# Patient Record
Sex: Female | Born: 1935
Health system: Southern US, Community
[De-identification: ages and names within clinical notes are randomized; demographics above are authoritative.]

## PROBLEM LIST (undated history)

## (undated) DIAGNOSIS — M810 Age-related osteoporosis without current pathological fracture: Secondary | ICD-10-CM

## (undated) DIAGNOSIS — B019 Varicella without complication: Secondary | ICD-10-CM

## (undated) DIAGNOSIS — N390 Urinary tract infection, site not specified: Secondary | ICD-10-CM

## (undated) DIAGNOSIS — M1990 Unspecified osteoarthritis, unspecified site: Secondary | ICD-10-CM

## (undated) DIAGNOSIS — F419 Anxiety disorder, unspecified: Secondary | ICD-10-CM

## (undated) DIAGNOSIS — E119 Type 2 diabetes mellitus without complications: Secondary | ICD-10-CM

## (undated) DIAGNOSIS — K219 Gastro-esophageal reflux disease without esophagitis: Secondary | ICD-10-CM

## (undated) DIAGNOSIS — I10 Essential (primary) hypertension: Secondary | ICD-10-CM

## (undated) HISTORY — PX: GALLBLADDER SURGERY: SHX652

## (undated) HISTORY — DX: Essential (primary) hypertension: I10

## (undated) HISTORY — PX: CHOLECYSTECTOMY: SHX55

## (undated) HISTORY — DX: Gastro-esophageal reflux disease without esophagitis: K21.9

## (undated) HISTORY — DX: Urinary tract infection, site not specified: N39.0

## (undated) HISTORY — PX: WRIST FRACTURE SURGERY: SHX121

## (undated) HISTORY — PX: TONSILLECTOMY: SUR1361

## (undated) HISTORY — DX: Varicella without complication: B01.9

## (undated) HISTORY — DX: Type 2 diabetes mellitus without complications: E11.9

## (undated) HISTORY — DX: Age-related osteoporosis without current pathological fracture: M81.0

## (undated) HISTORY — PX: ABDOMINAL HYSTERECTOMY: SHX81

## (undated) HISTORY — DX: Unspecified osteoarthritis, unspecified site: M19.90

---
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
## 1982-08-09 HISTORY — PX: ABDOMINAL HYSTERECTOMY: SHX81

## 2013-04-06 LAB — HM PAP SMEAR: HM Pap smear: NORMAL

## 2015-06-17 DIAGNOSIS — F32A Depression, unspecified: Secondary | ICD-10-CM | POA: Insufficient documentation

## 2015-06-17 DIAGNOSIS — J40 Bronchitis, not specified as acute or chronic: Secondary | ICD-10-CM | POA: Insufficient documentation

## 2015-06-17 HISTORY — DX: Bronchitis, not specified as acute or chronic: J40

## 2015-08-25 DIAGNOSIS — R6884 Jaw pain: Secondary | ICD-10-CM | POA: Diagnosis not present

## 2015-08-28 DIAGNOSIS — R300 Dysuria: Secondary | ICD-10-CM | POA: Diagnosis not present

## 2015-08-28 DIAGNOSIS — N3090 Cystitis, unspecified without hematuria: Secondary | ICD-10-CM | POA: Diagnosis not present

## 2015-09-09 DIAGNOSIS — I10 Essential (primary) hypertension: Secondary | ICD-10-CM | POA: Diagnosis not present

## 2015-09-09 DIAGNOSIS — E119 Type 2 diabetes mellitus without complications: Secondary | ICD-10-CM | POA: Diagnosis not present

## 2015-09-12 DIAGNOSIS — N390 Urinary tract infection, site not specified: Secondary | ICD-10-CM | POA: Diagnosis not present

## 2015-09-25 DIAGNOSIS — E119 Type 2 diabetes mellitus without complications: Secondary | ICD-10-CM | POA: Diagnosis not present

## 2015-09-25 DIAGNOSIS — R300 Dysuria: Secondary | ICD-10-CM | POA: Diagnosis not present

## 2015-09-25 DIAGNOSIS — E785 Hyperlipidemia, unspecified: Secondary | ICD-10-CM | POA: Diagnosis not present

## 2015-11-24 DIAGNOSIS — I10 Essential (primary) hypertension: Secondary | ICD-10-CM | POA: Diagnosis not present

## 2015-11-24 DIAGNOSIS — E119 Type 2 diabetes mellitus without complications: Secondary | ICD-10-CM | POA: Diagnosis not present

## 2015-11-24 DIAGNOSIS — F419 Anxiety disorder, unspecified: Secondary | ICD-10-CM | POA: Diagnosis not present

## 2015-11-28 DIAGNOSIS — L989 Disorder of the skin and subcutaneous tissue, unspecified: Secondary | ICD-10-CM | POA: Diagnosis not present

## 2015-12-25 DIAGNOSIS — E119 Type 2 diabetes mellitus without complications: Secondary | ICD-10-CM | POA: Diagnosis not present

## 2015-12-25 DIAGNOSIS — R300 Dysuria: Secondary | ICD-10-CM | POA: Diagnosis not present

## 2016-01-14 DIAGNOSIS — N390 Urinary tract infection, site not specified: Secondary | ICD-10-CM | POA: Diagnosis not present

## 2016-01-27 DIAGNOSIS — B078 Other viral warts: Secondary | ICD-10-CM | POA: Diagnosis not present

## 2016-01-27 DIAGNOSIS — E1141 Type 2 diabetes mellitus with diabetic mononeuropathy: Secondary | ICD-10-CM | POA: Diagnosis not present

## 2016-01-27 DIAGNOSIS — I739 Peripheral vascular disease, unspecified: Secondary | ICD-10-CM | POA: Diagnosis not present

## 2016-01-27 DIAGNOSIS — R601 Generalized edema: Secondary | ICD-10-CM | POA: Diagnosis not present

## 2016-01-27 DIAGNOSIS — B351 Tinea unguium: Secondary | ICD-10-CM | POA: Diagnosis not present

## 2016-02-03 DIAGNOSIS — Z1272 Encounter for screening for malignant neoplasm of vagina: Secondary | ICD-10-CM | POA: Diagnosis not present

## 2016-02-03 DIAGNOSIS — Z01419 Encounter for gynecological examination (general) (routine) without abnormal findings: Secondary | ICD-10-CM | POA: Diagnosis not present

## 2016-02-05 DIAGNOSIS — I739 Peripheral vascular disease, unspecified: Secondary | ICD-10-CM | POA: Diagnosis not present

## 2016-02-05 DIAGNOSIS — R601 Generalized edema: Secondary | ICD-10-CM | POA: Diagnosis not present

## 2016-02-05 DIAGNOSIS — E1141 Type 2 diabetes mellitus with diabetic mononeuropathy: Secondary | ICD-10-CM | POA: Diagnosis not present

## 2016-02-05 DIAGNOSIS — M1990 Unspecified osteoarthritis, unspecified site: Secondary | ICD-10-CM | POA: Diagnosis not present

## 2016-02-06 DIAGNOSIS — E1141 Type 2 diabetes mellitus with diabetic mononeuropathy: Secondary | ICD-10-CM | POA: Diagnosis not present

## 2016-02-06 DIAGNOSIS — I739 Peripheral vascular disease, unspecified: Secondary | ICD-10-CM | POA: Diagnosis not present

## 2016-02-06 DIAGNOSIS — R601 Generalized edema: Secondary | ICD-10-CM | POA: Diagnosis not present

## 2016-02-06 DIAGNOSIS — M1990 Unspecified osteoarthritis, unspecified site: Secondary | ICD-10-CM | POA: Diagnosis not present

## 2016-02-17 DIAGNOSIS — Z1231 Encounter for screening mammogram for malignant neoplasm of breast: Secondary | ICD-10-CM | POA: Diagnosis not present

## 2016-02-17 LAB — HM MAMMOGRAPHY

## 2016-02-18 DIAGNOSIS — E119 Type 2 diabetes mellitus without complications: Secondary | ICD-10-CM | POA: Diagnosis not present

## 2016-02-18 DIAGNOSIS — R2681 Unsteadiness on feet: Secondary | ICD-10-CM | POA: Diagnosis not present

## 2016-02-18 DIAGNOSIS — F419 Anxiety disorder, unspecified: Secondary | ICD-10-CM | POA: Diagnosis not present

## 2016-02-18 DIAGNOSIS — E785 Hyperlipidemia, unspecified: Secondary | ICD-10-CM | POA: Diagnosis not present

## 2016-02-18 DIAGNOSIS — I10 Essential (primary) hypertension: Secondary | ICD-10-CM | POA: Diagnosis not present

## 2016-02-24 DIAGNOSIS — R601 Generalized edema: Secondary | ICD-10-CM | POA: Diagnosis not present

## 2016-02-24 DIAGNOSIS — E1141 Type 2 diabetes mellitus with diabetic mononeuropathy: Secondary | ICD-10-CM | POA: Diagnosis not present

## 2016-02-24 DIAGNOSIS — M1990 Unspecified osteoarthritis, unspecified site: Secondary | ICD-10-CM | POA: Diagnosis not present

## 2016-02-24 DIAGNOSIS — I739 Peripheral vascular disease, unspecified: Secondary | ICD-10-CM | POA: Diagnosis not present

## 2016-03-03 DIAGNOSIS — R2681 Unsteadiness on feet: Secondary | ICD-10-CM | POA: Diagnosis not present

## 2016-03-05 DIAGNOSIS — R2689 Other abnormalities of gait and mobility: Secondary | ICD-10-CM | POA: Diagnosis not present

## 2016-03-10 DIAGNOSIS — R2689 Other abnormalities of gait and mobility: Secondary | ICD-10-CM | POA: Diagnosis not present

## 2016-03-12 DIAGNOSIS — R2689 Other abnormalities of gait and mobility: Secondary | ICD-10-CM | POA: Diagnosis not present

## 2016-03-15 DIAGNOSIS — R2689 Other abnormalities of gait and mobility: Secondary | ICD-10-CM | POA: Diagnosis not present

## 2016-03-17 DIAGNOSIS — R2689 Other abnormalities of gait and mobility: Secondary | ICD-10-CM | POA: Diagnosis not present

## 2016-03-22 DIAGNOSIS — R2689 Other abnormalities of gait and mobility: Secondary | ICD-10-CM | POA: Diagnosis not present

## 2016-03-24 DIAGNOSIS — R2689 Other abnormalities of gait and mobility: Secondary | ICD-10-CM | POA: Diagnosis not present

## 2016-03-29 DIAGNOSIS — S81811A Laceration without foreign body, right lower leg, initial encounter: Secondary | ICD-10-CM | POA: Diagnosis not present

## 2016-03-29 DIAGNOSIS — E119 Type 2 diabetes mellitus without complications: Secondary | ICD-10-CM | POA: Diagnosis not present

## 2016-03-30 DIAGNOSIS — B351 Tinea unguium: Secondary | ICD-10-CM | POA: Diagnosis not present

## 2016-03-30 DIAGNOSIS — B078 Other viral warts: Secondary | ICD-10-CM | POA: Diagnosis not present

## 2016-03-30 DIAGNOSIS — R601 Generalized edema: Secondary | ICD-10-CM | POA: Diagnosis not present

## 2016-03-30 DIAGNOSIS — M1990 Unspecified osteoarthritis, unspecified site: Secondary | ICD-10-CM | POA: Diagnosis not present

## 2016-03-30 DIAGNOSIS — E1141 Type 2 diabetes mellitus with diabetic mononeuropathy: Secondary | ICD-10-CM | POA: Diagnosis not present

## 2016-03-30 DIAGNOSIS — I739 Peripheral vascular disease, unspecified: Secondary | ICD-10-CM | POA: Diagnosis not present

## 2016-04-30 DIAGNOSIS — I10 Essential (primary) hypertension: Secondary | ICD-10-CM | POA: Diagnosis not present

## 2016-04-30 DIAGNOSIS — R21 Rash and other nonspecific skin eruption: Secondary | ICD-10-CM | POA: Diagnosis not present

## 2016-05-15 DIAGNOSIS — N760 Acute vaginitis: Secondary | ICD-10-CM | POA: Diagnosis not present

## 2016-05-15 DIAGNOSIS — E785 Hyperlipidemia, unspecified: Secondary | ICD-10-CM | POA: Diagnosis not present

## 2016-05-15 DIAGNOSIS — E119 Type 2 diabetes mellitus without complications: Secondary | ICD-10-CM | POA: Diagnosis not present

## 2016-05-15 DIAGNOSIS — N341 Nonspecific urethritis: Secondary | ICD-10-CM | POA: Diagnosis not present

## 2016-05-20 DIAGNOSIS — N390 Urinary tract infection, site not specified: Secondary | ICD-10-CM | POA: Diagnosis not present

## 2016-05-20 DIAGNOSIS — E119 Type 2 diabetes mellitus without complications: Secondary | ICD-10-CM | POA: Diagnosis not present

## 2016-05-20 DIAGNOSIS — J309 Allergic rhinitis, unspecified: Secondary | ICD-10-CM | POA: Diagnosis not present

## 2016-05-20 DIAGNOSIS — F419 Anxiety disorder, unspecified: Secondary | ICD-10-CM | POA: Diagnosis not present

## 2016-05-20 DIAGNOSIS — I10 Essential (primary) hypertension: Secondary | ICD-10-CM | POA: Diagnosis not present

## 2016-06-16 DIAGNOSIS — R601 Generalized edema: Secondary | ICD-10-CM | POA: Diagnosis not present

## 2016-06-16 DIAGNOSIS — E1141 Type 2 diabetes mellitus with diabetic mononeuropathy: Secondary | ICD-10-CM | POA: Diagnosis not present

## 2016-06-16 DIAGNOSIS — L609 Nail disorder, unspecified: Secondary | ICD-10-CM | POA: Diagnosis not present

## 2016-06-16 DIAGNOSIS — B078 Other viral warts: Secondary | ICD-10-CM | POA: Diagnosis not present

## 2016-06-16 DIAGNOSIS — M1990 Unspecified osteoarthritis, unspecified site: Secondary | ICD-10-CM | POA: Diagnosis not present

## 2016-06-16 DIAGNOSIS — I739 Peripheral vascular disease, unspecified: Secondary | ICD-10-CM | POA: Diagnosis not present

## 2016-06-16 DIAGNOSIS — L84 Corns and callosities: Secondary | ICD-10-CM | POA: Diagnosis not present

## 2016-06-30 DIAGNOSIS — L299 Pruritus, unspecified: Secondary | ICD-10-CM | POA: Diagnosis not present

## 2016-07-13 HISTORY — PX: MANDIBLE FRACTURE SURGERY: SHX706

## 2016-07-29 DIAGNOSIS — H04123 Dry eye syndrome of bilateral lacrimal glands: Secondary | ICD-10-CM | POA: Diagnosis not present

## 2016-07-29 DIAGNOSIS — H2513 Age-related nuclear cataract, bilateral: Secondary | ICD-10-CM | POA: Diagnosis not present

## 2016-07-29 DIAGNOSIS — E119 Type 2 diabetes mellitus without complications: Secondary | ICD-10-CM | POA: Diagnosis not present

## 2016-08-23 DIAGNOSIS — Z0000 Encounter for general adult medical examination without abnormal findings: Secondary | ICD-10-CM | POA: Diagnosis not present

## 2016-08-23 DIAGNOSIS — E538 Deficiency of other specified B group vitamins: Secondary | ICD-10-CM | POA: Diagnosis not present

## 2016-08-23 DIAGNOSIS — E119 Type 2 diabetes mellitus without complications: Secondary | ICD-10-CM | POA: Diagnosis not present

## 2016-08-23 DIAGNOSIS — K219 Gastro-esophageal reflux disease without esophagitis: Secondary | ICD-10-CM | POA: Diagnosis not present

## 2016-08-23 DIAGNOSIS — M109 Gout, unspecified: Secondary | ICD-10-CM | POA: Diagnosis not present

## 2016-08-23 DIAGNOSIS — F419 Anxiety disorder, unspecified: Secondary | ICD-10-CM | POA: Diagnosis not present

## 2016-08-24 DIAGNOSIS — E119 Type 2 diabetes mellitus without complications: Secondary | ICD-10-CM | POA: Diagnosis not present

## 2016-08-24 DIAGNOSIS — I10 Essential (primary) hypertension: Secondary | ICD-10-CM | POA: Diagnosis not present

## 2016-08-24 DIAGNOSIS — E538 Deficiency of other specified B group vitamins: Secondary | ICD-10-CM | POA: Diagnosis not present

## 2016-08-24 DIAGNOSIS — M109 Gout, unspecified: Secondary | ICD-10-CM | POA: Diagnosis not present

## 2016-10-05 DIAGNOSIS — L820 Inflamed seborrheic keratosis: Secondary | ICD-10-CM | POA: Diagnosis not present

## 2016-10-05 DIAGNOSIS — L578 Other skin changes due to chronic exposure to nonionizing radiation: Secondary | ICD-10-CM | POA: Diagnosis not present

## 2016-10-05 DIAGNOSIS — L570 Actinic keratosis: Secondary | ICD-10-CM | POA: Diagnosis not present

## 2016-12-07 DIAGNOSIS — E119 Type 2 diabetes mellitus without complications: Secondary | ICD-10-CM | POA: Diagnosis not present

## 2016-12-07 DIAGNOSIS — N390 Urinary tract infection, site not specified: Secondary | ICD-10-CM | POA: Diagnosis not present

## 2016-12-07 DIAGNOSIS — I10 Essential (primary) hypertension: Secondary | ICD-10-CM | POA: Diagnosis not present

## 2016-12-08 DIAGNOSIS — B078 Other viral warts: Secondary | ICD-10-CM | POA: Diagnosis not present

## 2016-12-08 DIAGNOSIS — M1990 Unspecified osteoarthritis, unspecified site: Secondary | ICD-10-CM | POA: Diagnosis not present

## 2016-12-08 DIAGNOSIS — E1141 Type 2 diabetes mellitus with diabetic mononeuropathy: Secondary | ICD-10-CM | POA: Diagnosis not present

## 2016-12-08 DIAGNOSIS — R601 Generalized edema: Secondary | ICD-10-CM | POA: Diagnosis not present

## 2016-12-08 DIAGNOSIS — L84 Corns and callosities: Secondary | ICD-10-CM | POA: Diagnosis not present

## 2016-12-08 DIAGNOSIS — I739 Peripheral vascular disease, unspecified: Secondary | ICD-10-CM | POA: Diagnosis not present

## 2016-12-08 DIAGNOSIS — L609 Nail disorder, unspecified: Secondary | ICD-10-CM | POA: Diagnosis not present

## 2016-12-13 DIAGNOSIS — K219 Gastro-esophageal reflux disease without esophagitis: Secondary | ICD-10-CM | POA: Diagnosis not present

## 2016-12-13 DIAGNOSIS — E119 Type 2 diabetes mellitus without complications: Secondary | ICD-10-CM | POA: Diagnosis not present

## 2016-12-13 DIAGNOSIS — I10 Essential (primary) hypertension: Secondary | ICD-10-CM | POA: Diagnosis not present

## 2016-12-28 DIAGNOSIS — L820 Inflamed seborrheic keratosis: Secondary | ICD-10-CM | POA: Diagnosis not present

## 2017-03-29 DIAGNOSIS — L089 Local infection of the skin and subcutaneous tissue, unspecified: Secondary | ICD-10-CM | POA: Diagnosis not present

## 2017-03-29 DIAGNOSIS — L03113 Cellulitis of right upper limb: Secondary | ICD-10-CM | POA: Diagnosis not present

## 2017-03-29 DIAGNOSIS — S40811A Abrasion of right upper arm, initial encounter: Secondary | ICD-10-CM | POA: Diagnosis not present

## 2017-04-20 DIAGNOSIS — R21 Rash and other nonspecific skin eruption: Secondary | ICD-10-CM | POA: Diagnosis not present

## 2017-04-27 ENCOUNTER — Encounter: Payer: Self-pay | Admitting: Primary Care

## 2017-04-27 ENCOUNTER — Other Ambulatory Visit: Payer: Self-pay | Admitting: Primary Care

## 2017-04-27 ENCOUNTER — Ambulatory Visit (INDEPENDENT_AMBULATORY_CARE_PROVIDER_SITE_OTHER): Payer: Medicare Other | Admitting: Primary Care

## 2017-04-27 VITALS — BP 140/70 | HR 75 | Temp 98.5°F | Ht 65.75 in | Wt 130.8 lb

## 2017-04-27 DIAGNOSIS — N644 Mastodynia: Secondary | ICD-10-CM | POA: Diagnosis not present

## 2017-04-27 DIAGNOSIS — I10 Essential (primary) hypertension: Secondary | ICD-10-CM | POA: Insufficient documentation

## 2017-04-27 DIAGNOSIS — M810 Age-related osteoporosis without current pathological fracture: Secondary | ICD-10-CM | POA: Diagnosis not present

## 2017-04-27 DIAGNOSIS — E1169 Type 2 diabetes mellitus with other specified complication: Secondary | ICD-10-CM | POA: Insufficient documentation

## 2017-04-27 DIAGNOSIS — Z0100 Encounter for examination of eyes and vision without abnormal findings: Secondary | ICD-10-CM

## 2017-04-27 DIAGNOSIS — E119 Type 2 diabetes mellitus without complications: Secondary | ICD-10-CM

## 2017-04-27 LAB — VITAMIN D 25 HYDROXY (VIT D DEFICIENCY, FRACTURES): VITD: 56.57 ng/mL (ref 30.00–100.00)

## 2017-04-27 MED ORDER — VITAMIN D (ERGOCALCIFEROL) 1.25 MG (50000 UNIT) PO CAPS: 50000.0000 [IU] | ORAL_CAPSULE | ORAL | 1 refills | Status: DC

## 2017-04-27 MED ORDER — LOSARTAN POTASSIUM 25 MG PO TABS: 25.0000 mg | ORAL_TABLET | Freq: Every day | ORAL | 0 refills | Status: DC

## 2017-04-27 NOTE — Progress Notes (Signed)
Subjective:    Patient ID: Morgan Bowen, female    DOB: [birth year], 81 y.o.   MRN: [id number]  HPI  Morgan Bowen is a 81 year old female who presents today to establish care and discuss the problems mentioned below. Will obtain old records.Recently moved from [place] to be near her son since the recent passing of her husband.  1) Type 2 Diabetes: Diagnosed with diabetes in 1996. Currently managed on glimepiride 2 mg and metformin 500 mg BID. Her last A1C was three months ago and was 6.1. Her recent A1C was 6.5 in August 2018. She checks her blood sugars every morning fasting and gets readings of 120-137, sometimes high 200's.   2) Essential Hypertension: Currently managed on valsartan 320 mg, this was recently recalled and she is needing an alternative; and diltiazem 300 mg. Her BP in the office today is 140/70. She stopped taking valsartan 4 weeks ago. She checks her BP at home and gets readings of 130-140's/60's-70's. She denies dizziness, chest pain, shortness of breath.  3) Osteoporosis/Vitamin D deficiency: Currently managed on vitamin D 50,000 units once weekly. Her last vitamin D check was about 3 months ago, is needing a refill today. Her last bone density scan was one year ago.  4) Breast Pain: Located to the left breast which has been present for the past several months. She will experience a "twinge" type pain that will last for a few seconds then dissipate. She is due for her mammogram. She denies changes in skin texture and any masses/lumps.  Review of Systems  Constitutional: Negative for unexpected weight change.  Respiratory: Negative for shortness of breath.   Cardiovascular: Negative for chest pain.  Genitourinary:       Breast pain  Neurological: Negative for weakness, numbness and headaches.       Past Medical History:  Diagnosis Date  . Arthritis   . Chickenpox   . GERD (gastroesophageal reflux disease)   . Hypertension   . Osteoporosis   . Type 2 diabetes  mellitus ([place])      Social History   Social History  . Marital status: Widowed    Spouse name: N/A  . Number of children: N/A  . Years of education: N/A   Occupational History  . Not on file.   Social History Main Topics  . Smoking status: Never Smoker  . Smokeless tobacco: Never Used  . Alcohol use No  . Drug use: Unknown  . Sexual activity: Not on file   Other Topics Concern  . Not on file   Social History Narrative   Widowed.   Moved from [place].   1 son, 3 grandchildren.   Retired. Once worked as a Agricultural engineer.       Past Surgical History:  Procedure Laterality Date  . ABDOMINAL HYSTERECTOMY  1984  . GALLBLADDER SURGERY    . MANDIBLE FRACTURE SURGERY  07/13/2016  . TONSILLECTOMY  1958  . WRIST FRACTURE SURGERY Right    2000    No family history on file.  Allergies  Allergen Reactions  . Penicillins Rash    No current outpatient prescriptions on file prior to visit.   No current facility-administered medications on file prior to visit.     BP 140/70   Pulse 75   Temp 98.5 F (36.9 C) (Oral)   Ht 5' 5.75" (1.67 m)   Wt 130 lb 12.8 oz (59.3 kg)   SpO2 98%   BMI 21.27 kg/m  Objective:   Physical Exam  Constitutional: She is oriented to person, place, and time. She appears well-nourished.  Neck: Neck supple.  Cardiovascular: Normal rate and regular rhythm.   Pulmonary/Chest: Effort normal and breath sounds normal.    Breasts pain to entire left breast. Densities noted at 10 to 11:00 position and 7 to 8 o'clock position. No changes in skin texture  Neurological: She is alert and oriented to person, place, and time.  Skin: Skin is warm and dry.  Psychiatric: She has a normal mood and affect.          Assessment & Plan:  Breat Pain:   Located to entire left breast intermittently. Exam today with dense tissues as noted above, overall unremarkable. Nontender. Orders for diagnostic mammogram with ultrasound placed. Could very  well be muscular if mammography unremarkable.  Sheral Flow, NP

## 2017-04-27 NOTE — Patient Instructions (Addendum)
Start losartan 25 mg tablets for high blood pressure. Take 1 tablet by mouth once daily. Continue diltiazem 300 mg once daily.  Monitor your blood pressure for the next several weeks, we will call you for readings at that time.  You will be contacted regarding your referral to the eye doctor and also for your mammogram.  Please let us know if you have not heard back within one week.   Complete lab work prior to leaving today. I will notify you of your results once received.   Follow up in 6 months for re-evaluation.  It was a pleasure to meet you today! Please don't hesitate to call me with any questions. Welcome to [facility]!

## 2017-04-27 NOTE — Assessment & Plan Note (Signed)
Currently managed on Vitamin D 50,000 units once weekly. Will repeat vitamin D today. Bone density scan was one year ago.

## 2017-04-27 NOTE — Assessment & Plan Note (Signed)
Recent A1C of 6.5 in August 2018. Continue glimepiride and metformin. Managed on ARB, not on statin, recent lipid panel unremarkable.

## 2017-04-27 NOTE — Assessment & Plan Note (Addendum)
Currently managed on valsartan 320 mg and diltiazem 300 mg. Valsartan recalled, has not been on for the past 4 weeks. BP checked by her provider in August which was 173/83. Given BP levels and history of diabetes, would recommend low dose losartan. Rx for losartan 25 mg sent to pharmacy. She will monitor BP at home for the next several weeks, will call in 2 weeks. Recent BMP unremarkable.

## 2017-05-02 ENCOUNTER — Telehealth: Payer: Self-pay | Admitting: Primary Care

## 2017-05-02 NOTE — Telephone Encounter (Signed)
leftmessage asking pt to call office  °

## 2017-05-11 ENCOUNTER — Encounter: Payer: Self-pay | Admitting: Primary Care

## 2017-05-11 ENCOUNTER — Ambulatory Visit (INDEPENDENT_AMBULATORY_CARE_PROVIDER_SITE_OTHER): Payer: Medicare Other | Admitting: Primary Care

## 2017-05-11 VITALS — BP 136/74 | HR 76 | Temp 97.8°F | Ht 65.75 in | Wt 130.8 lb

## 2017-05-11 DIAGNOSIS — E2839 Other primary ovarian failure: Secondary | ICD-10-CM | POA: Diagnosis not present

## 2017-05-11 DIAGNOSIS — E119 Type 2 diabetes mellitus without complications: Secondary | ICD-10-CM | POA: Diagnosis not present

## 2017-05-11 DIAGNOSIS — M810 Age-related osteoporosis without current pathological fracture: Secondary | ICD-10-CM | POA: Diagnosis not present

## 2017-05-11 DIAGNOSIS — I10 Essential (primary) hypertension: Secondary | ICD-10-CM

## 2017-05-11 DIAGNOSIS — M545 Low back pain, unspecified: Secondary | ICD-10-CM

## 2017-05-11 LAB — POC URINALSYSI DIPSTICK (AUTOMATED)
Bilirubin, UA: NEGATIVE
Blood, UA: NEGATIVE
Glucose, UA: NEGATIVE
KETONES UA: NEGATIVE
LEUKOCYTES UA: NEGATIVE
Nitrite, UA: NEGATIVE
PH UA: 6 (ref 5.0–8.0)
PROTEIN UA: NEGATIVE
SPEC GRAV UA: 1.015 (ref 1.010–1.025)
Urobilinogen, UA: NEGATIVE E.U./dL — AB

## 2017-05-11 NOTE — Patient Instructions (Addendum)
Continue taking losartan 25 mg and diltiazem 300 mg for blood pressure.  Schedule the bone density scan as discussed.   You may take Advil 400 mg three times daily as needed for low back pain for 1-2 weeks.  Schedule a follow up visit in February 2019.  It was a pleasure to see you today!   Back Exercises The following exercises strengthen the muscles that help to support the back. They also help to keep the lower back flexible. Doing these exercises can help to prevent back pain or lessen existing pain. If you have back pain or discomfort, try doing these exercises 2-3 times each day or as told by your health care provider. When the pain goes away, do them once each day, but increase the number of times that you repeat the steps for each exercise (do more repetitions). If you do not have back pain or discomfort, do these exercises once each day or as told by your health care provider. Exercises Single Knee to Chest  Repeat these steps 3-5 times for each leg: 1. Lie on your back on a firm bed or the floor with your legs extended. 2. Bring one knee to your chest. Your other leg should stay extended and in contact with the floor. 3. Hold your knee in place by grabbing your knee or thigh. 4. Pull on your knee until you feel a gentle stretch in your lower back. 5. Hold the stretch for 10-30 seconds. 6. Slowly release and straighten your leg.  Pelvic Tilt  Repeat these steps 5-10 times: 1. Lie on your back on a firm bed or the floor with your legs extended. 2. Bend your knees so they are pointing toward the ceiling and your feet are flat on the floor. 3. Tighten your lower abdominal muscles to press your lower back against the floor. This motion will tilt your pelvis so your tailbone points up toward the ceiling instead of pointing to your feet or the floor. 4. With gentle tension and even breathing, hold this position for 5-10 seconds.  Cat-Cow  Repeat these steps until your lower back  becomes more flexible: 1. Get into a hands-and-knees position on a firm surface. Keep your hands under your shoulders, and keep your knees under your hips. You may place padding under your knees for comfort. 2. Let your head hang down, and point your tailbone toward the floor so your lower back becomes rounded like the back of a cat. 3. Hold this position for 5 seconds. 4. Slowly lift your head and point your tailbone up toward the ceiling so your back forms a sagging arch like the back of a cow. 5. Hold this position for 5 seconds.  Press-Ups  Repeat these steps 5-10 times: 1. Lie on your abdomen (face-down) on the floor. 2. Place your palms near your head, about shoulder-width apart. 3. While you keep your back as relaxed as possible and keep your hips on the floor, slowly straighten your arms to raise the top half of your body and lift your shoulders. Do not use your back muscles to raise your upper torso. You may adjust the placement of your hands to make yourself more comfortable. 4. Hold this position for 5 seconds while you keep your back relaxed. 5. Slowly return to lying flat on the floor.  Bridges  Repeat these steps 10 times: 1. Lie on your back on a firm surface. 2. Bend your knees so they are pointing toward the ceiling and your  feet are flat on the floor. 3. Tighten your buttocks muscles and lift your buttocks off of the floor until your waist is at almost the same height as your knees. You should feel the muscles working in your buttocks and the back of your thighs. If you do not feel these muscles, slide your feet 1-2 inches farther away from your buttocks. 4. Hold this position for 3-5 seconds. 5. Slowly lower your hips to the starting position, and allow your buttocks muscles to relax completely.  If this exercise is too easy, try doing it with your arms crossed over your chest. Abdominal Crunches  Repeat these steps 5-10 times: 1. Lie on your back on a firm bed or the  floor with your legs extended. 2. Bend your knees so they are pointing toward the ceiling and your feet are flat on the floor. 3. Cross your arms over your chest. 4. Tip your chin slightly toward your chest without bending your neck. 5. Tighten your abdominal muscles and slowly raise your trunk (torso) high enough to lift your shoulder blades a tiny bit off of the floor. Avoid raising your torso higher than that, because it can put too much stress on your low back and it does not help to strengthen your abdominal muscles. 6. Slowly return to your starting position.  Back Lifts Repeat these steps 5-10 times: 1. Lie on your abdomen (face-down) with your arms at your sides, and rest your forehead on the floor. 2. Tighten the muscles in your legs and your buttocks. 3. Slowly lift your chest off of the floor while you keep your hips pressed to the floor. Keep the back of your head in line with the curve in your back. Your eyes should be looking at the floor. 4. Hold this position for 3-5 seconds. 5. Slowly return to your starting position.  Contact a health care provider if:  Your back pain or discomfort gets much worse when you do an exercise.  Your back pain or discomfort does not lessen within 2 hours after you exercise. If you have any of these problems, stop doing these exercises right away. Do not do them again unless your health care provider says that you can. Get help right away if:  You develop sudden, severe back pain. If this happens, stop doing the exercises right away. Do not do them again unless your health care provider says that you can. This information is not intended to replace advice given to you by your health care provider. Make sure you discuss any questions you have with your health care provider. Document Released: 09/02/2004 Document Revised: 12/03/2015 Document Reviewed: 09/19/2014 Elsevier Interactive Patient Education  2017 [employer].

## 2017-05-11 NOTE — Progress Notes (Signed)
Subjective:    Patient ID: Morgan Bowen, female    DOB: 23-Jan-1936, 81 y.o.   MRN: [id number]  HPI  Morgan Bowen is a 81 year old female who presents today for follow up of hypertension.  Morgan Bowen is currently managed on diltiazem 300 mg and losartan 25 mg. Previously managed on valsartan which was recalled. During her initial visit 2 weeks ago Morgan Bowen endorsed that Morgan Bowen has not had her valsartan in 4 weeks. Given her history of diabetes Morgan Bowen was initiated on low dose losartan.  Her BP in the office today is 136/74. Morgan Bowen's checked her blood pressure at home which is running 120's-130's/60's-70's.   Morgan Bowen has an appointment with her optometrist in December 2018.   2) Back Pain: Morgan Bowen has noticed right lower back that has been present for the past 2 months. Her pain is daily that Morgan Bowen describes as an achy pain. Morgan Bowen denies urinary frequency, dysuria, hematuria. Morgan Bowen will occasionally take Advil 200 mg with improvement.    Review of Systems  Constitutional: Negative for fever.  Respiratory: Negative for shortness of breath.   Cardiovascular: Negative for chest pain.  Genitourinary: Negative for difficulty urinating, dysuria, frequency and urgency.  Musculoskeletal: Positive for back pain.       Past Medical History:  Diagnosis Date  . Arthritis   . Chickenpox   . GERD (gastroesophageal reflux disease)   . Hypertension   . Osteoporosis   . Type 2 diabetes mellitus ([place])   . Urinary tract infection      Social History   Social History  . Marital status: Widowed    Spouse name: N/A  . Number of children: N/A  . Years of education: N/A   Occupational History  . Not on file.   Social History Main Topics  . Smoking status: Never Smoker  . Smokeless tobacco: Never Used  . Alcohol use No  . Drug use: Unknown  . Sexual activity: Not on file   Other Topics Concern  . Not on file   Social History Narrative   Widowed.   Moved from [place].   1 son, 3 grandchildren.   Retired. Once  worked as a Agricultural engineer.       Past Surgical History:  Procedure Laterality Date  . ABDOMINAL HYSTERECTOMY  1984  . APPENDECTOMY  1958  . GALLBLADDER SURGERY    . MANDIBLE FRACTURE SURGERY  07/13/2016  . TONSILLECTOMY  1958  . WRIST FRACTURE SURGERY Right    2000    Family History  Problem Relation Age of Onset  . Hypertension Mother   . Stroke Mother   . Stroke Father   . Hypertension Father   . Hypertension Sister   . Kidney disease Sister     Allergies  Allergen Reactions  . Penicillins Rash    Current Outpatient Prescriptions on File Prior to Visit  Medication Sig Dispense Refill  . diltiazem (TIAZAC) 300 MG 24 hr capsule Take 300 mg by mouth daily.    Marland Kitchen glimepiride (AMARYL) 2 MG tablet Take 2 mg by mouth daily with breakfast.    . losartan (COZAAR) 25 MG tablet Take 1 tablet (25 mg total) by mouth daily. 90 tablet 0  . metFORMIN (GLUCOPHAGE) 500 MG tablet Take 500 mg by mouth 2 (two) times daily with a meal.    . Vitamin D, Ergocalciferol, (DRISDOL) 50000 units CAPS capsule Take 1 capsule (50,000 Units total) by mouth once a week. 12 capsule 1   No current facility-administered medications  on file prior to visit.     BP 136/74   Pulse 76   Temp 97.8 F (36.6 C) (Oral)   Ht 5' 5.75" (1.67 m)   Wt 130 lb 12.8 oz (59.3 kg)   SpO2 98%   BMI 21.27 kg/m    Objective:   Physical Exam  Constitutional: Morgan Bowen appears well-nourished.  Neck: Neck supple.  Cardiovascular: Normal rate and regular rhythm.   Pulmonary/Chest: Effort normal and breath sounds normal.  Musculoskeletal:       Lumbar back: Morgan Bowen exhibits pain. Morgan Bowen exhibits normal range of motion and no tenderness.       Back:  Skin: Skin is warm and dry.          Assessment & Plan:  Low Back Pain:  Located to right lower back x 2 months. Exam today consistent for osteoarthritis. UA today negative. Discussed temporary use of low dose Advil x 1-2 weeks. Stretching exercises provided. Consider  imaging in the future if no improvement.  Sheral Flow, NP

## 2017-05-11 NOTE — Assessment & Plan Note (Signed)
Recheck A1C in February 2019. Foot exam unremarkable. Eye exam in December 2018.

## 2017-05-11 NOTE — Assessment & Plan Note (Signed)
Stable with switch to Losartan 25 mg from valsartan. Continue diltiazem and Losartan.

## 2017-05-11 NOTE — Assessment & Plan Note (Signed)
Repeat bone density scan pending. °

## 2017-05-14 ENCOUNTER — Encounter: Payer: Self-pay | Admitting: Primary Care

## 2017-05-19 ENCOUNTER — Encounter: Payer: Self-pay | Admitting: Primary Care

## 2017-05-23 ENCOUNTER — Other Ambulatory Visit: Payer: Self-pay | Admitting: Primary Care

## 2017-05-23 ENCOUNTER — Inpatient Hospital Stay
Admission: RE | Admit: 2017-05-23 | Discharge: 2017-05-23 | Disposition: A | Discharge status: Home / Self Care | Payer: Self-pay | Source: Ambulatory Visit | Attending: *Deleted | Admitting: *Deleted

## 2017-05-23 ENCOUNTER — Other Ambulatory Visit: Payer: Self-pay | Admitting: *Deleted

## 2017-05-23 DIAGNOSIS — Z9289 Personal history of other medical treatment: Secondary | ICD-10-CM

## 2017-05-23 DIAGNOSIS — N644 Mastodynia: Secondary | ICD-10-CM

## 2017-05-30 ENCOUNTER — Ambulatory Visit
Admission: RE | Admit: 2017-05-30 | Discharge: 2017-05-30 | Disposition: A | Discharge status: Home / Self Care | Payer: Medicare Other | Source: Ambulatory Visit | Attending: Primary Care | Admitting: Primary Care

## 2017-05-30 DIAGNOSIS — N644 Mastodynia: Secondary | ICD-10-CM

## 2017-05-30 DIAGNOSIS — N6489 Other specified disorders of breast: Secondary | ICD-10-CM | POA: Diagnosis not present

## 2017-05-30 DIAGNOSIS — R928 Other abnormal and inconclusive findings on diagnostic imaging of breast: Secondary | ICD-10-CM | POA: Diagnosis not present

## 2017-06-08 ENCOUNTER — Telehealth: Payer: Self-pay | Admitting: Primary Care

## 2017-06-08 NOTE — Telephone Encounter (Signed)
Left message asking pt to call office regarding schedule bone density

## 2017-07-09 LAB — HM DIABETES EYE EXAM

## 2017-07-19 ENCOUNTER — Ambulatory Visit
Admission: RE | Admit: 2017-07-19 | Discharge: 2017-07-19 | Disposition: A | Discharge status: Home / Self Care | Payer: Medicare Other | Source: Ambulatory Visit | Attending: Primary Care | Admitting: Primary Care

## 2017-07-19 DIAGNOSIS — E2839 Other primary ovarian failure: Secondary | ICD-10-CM | POA: Diagnosis not present

## 2017-07-19 DIAGNOSIS — E119 Type 2 diabetes mellitus without complications: Secondary | ICD-10-CM | POA: Diagnosis not present

## 2017-07-19 DIAGNOSIS — R296 Repeated falls: Secondary | ICD-10-CM | POA: Diagnosis not present

## 2017-07-19 DIAGNOSIS — M810 Age-related osteoporosis without current pathological fracture: Secondary | ICD-10-CM | POA: Insufficient documentation

## 2017-07-19 DIAGNOSIS — Z780 Asymptomatic menopausal state: Secondary | ICD-10-CM | POA: Diagnosis not present

## 2017-07-22 ENCOUNTER — Ambulatory Visit: Payer: Self-pay | Admitting: *Deleted

## 2017-07-22 DIAGNOSIS — S32000A Wedge compression fracture of unspecified lumbar vertebra, initial encounter for closed fracture: Secondary | ICD-10-CM | POA: Diagnosis not present

## 2017-07-22 DIAGNOSIS — M545 Low back pain: Secondary | ICD-10-CM | POA: Diagnosis not present

## 2017-07-22 DIAGNOSIS — M810 Age-related osteoporosis without current pathological fracture: Secondary | ICD-10-CM | POA: Diagnosis not present

## 2017-07-22 DIAGNOSIS — M549 Dorsalgia, unspecified: Secondary | ICD-10-CM | POA: Diagnosis not present

## 2017-07-22 NOTE — Telephone Encounter (Signed)
Pt called sounding tearful with complaints of back pain after raising out of seat of car to go to the post office. Pt states she heard and felt something pop while trying to get out of the car. Pt in car at this time and has pulled over to call the office. Advised pt to go to Urgent Care and told the pt to be careful with driving. Pt verbalized understanding and plans to go to Urgent Care.  Reason for Disposition . [1] High-risk adult (e.g., age > 62, osteoporosis, chronic steroid use) AND [2] still hurts  Answer Assessment - Initial Assessment Questions 1. MECHANISM: "How did the injury happen?" (Consider the possibility of domestic violence or elder abuse)     Pt states she was getting out of seat of car and heard and felt something pop in back 2. ONSET: "When did the injury happen?" (Minutes or hours ago)     Today, a few minutes ago 3. LOCATION: "What part of the back is injured?"     Lower back 4. SEVERITY: "Can you move the back normally?"     Currently sitting in car 5. PAIN: "Is there any pain?" If so, ask: "How bad is the pain?"   (Scale 1-10; or mild, moderate, severe)     Rating pain 6 6. CORD SYMPTOMS: Any weakness or numbness of the arms or legs?"     no 7. SIZE: For cuts, bruises, or swelling, ask: "How large is it?" (e.g., inches or centimeters)     no 8. TETANUS: For any breaks in the skin, ask: "When was the last tetanus booster?"     no 9. OTHER SYMPTOMS: "Do you have any other symptoms?" (e.g., abdominal pain, blood in urine)     no  Protocols used: BACK INJURY-A-AH

## 2017-07-23 ENCOUNTER — Other Ambulatory Visit: Payer: Self-pay | Admitting: Primary Care

## 2017-07-23 DIAGNOSIS — I10 Essential (primary) hypertension: Secondary | ICD-10-CM

## 2017-07-28 DIAGNOSIS — H2513 Age-related nuclear cataract, bilateral: Secondary | ICD-10-CM | POA: Diagnosis not present

## 2017-07-28 DIAGNOSIS — H52223 Regular astigmatism, bilateral: Secondary | ICD-10-CM | POA: Diagnosis not present

## 2017-07-28 DIAGNOSIS — H43393 Other vitreous opacities, bilateral: Secondary | ICD-10-CM | POA: Diagnosis not present

## 2017-07-28 DIAGNOSIS — E119 Type 2 diabetes mellitus without complications: Secondary | ICD-10-CM | POA: Diagnosis not present

## 2017-07-29 ENCOUNTER — Encounter: Payer: Self-pay | Admitting: Primary Care

## 2017-07-29 ENCOUNTER — Ambulatory Visit (INDEPENDENT_AMBULATORY_CARE_PROVIDER_SITE_OTHER): Payer: Medicare Other | Admitting: Primary Care

## 2017-07-29 VITALS — BP 142/76 | HR 88 | Temp 97.7°F | Wt 130.5 lb

## 2017-07-29 DIAGNOSIS — M810 Age-related osteoporosis without current pathological fracture: Secondary | ICD-10-CM | POA: Diagnosis not present

## 2017-07-29 DIAGNOSIS — M545 Low back pain, unspecified: Secondary | ICD-10-CM

## 2017-07-29 NOTE — Patient Instructions (Addendum)
Increase Ibuprofen to 600 mg three times daily if needed for back pain. If no significant improvement then please notify me.  Purchase a back brace over the counter at any drug store.  Continue calcium with vitamin D. Consider treatment with Prolia as discussed.  Return Monday next week for your xray.  Please don't hesitate to call me or message me with any questions.  It was a pleasure to see you today!   Spinal Compression Fracture A spinal compression fracture is a collapse of the bones that form the spine (vertebrae). With this type of fracture, the vertebrae become squashed (compressed) into a wedge shape. Most compression fractures happen in the middle or lower part of the spine. What are the causes? This condition may be caused by:  Thinning and loss of density in the bones (osteoporosis). This is the most common cause.  A fall.  A car or motorcycle accident.  Cancer.  Trauma, such as a heavy, direct hit to the head.  What increases the risk? You may be at greater risk for a spinal compression fracture if you:  Are 39 years old or older.  Have osteoporosis.  Have certain types of cancer, including: ? Multiple myeloma. ? Lymphoma. ? Prostate cancer. ? Lung cancer. ? Breast cancer.  What are the signs or symptoms? Symptoms of this condition include:  Severe pain.  Pain that gets worse over time.  Pain that is worse when you stand, walk, sit, or bend.  Sudden pain that is so bad that it is hard for you to move.  Bending or humping of the spine.  Gradual loss of height.  Numbness, tingling, or weakness in the back and legs.  Trouble walking.  Your symptoms will depend on the cause of the fracture and how quickly it develops. For example, fractures that are caused by osteoporosis can cause few symptoms, no symptoms, or symptoms that develop slowly over time. How is this diagnosed? This condition may be diagnosed based on symptoms, medical history, and  a physical exam. During the physical exam, your health care provider may tap along the length of your spine to check for tenderness. Tests may be done to confirm the diagnosis. They may include:  A bone density test to check for osteoporosis.  Imaging tests, such as a spine X-ray, a CT scan, or MRI.  How is this treated? Treatment for this condition depends on the cause and severity of the condition.Some fractures, such as those that are caused by osteoporosis, may heal on their own with supportive care. This may include:  Pain medicine.  Rest.  A back brace.  Physical therapy exercises.  Medicine that reduces bone pain.  Calcium and vitamin D supplements.  Fractures that cause the back to become misshapen, cause nerve pain or weakness, or do not respond to other treatment may be treated with a surgical procedure, such as:  Vertebroplasty. In this procedure, bone cement is injected into the collapsed vertebrae to stabilize them.  Balloon kyphoplasty. In this procedure, the collapsed vertebrae are expanded with a balloon and then bone cement is injected into them.  Spinal fusion. In this procedure, the collapsed vertebrae are connected (fused) to normal vertebrae.  Follow these instructions at home: General instructions  Take medicines only as directed by your health care provider.  Do not drive or operate heavy machinery while taking pain medicine.  If directed, apply ice to the injured area: ? Put ice in a plastic bag. ? Place a towel between  your skin and the bag. ? Leave the ice on for 30 minutes every two hours at first. Then apply the ice as needed.  Wear your neck brace or back brace as directed by your health care provider.  Do not drink alcohol. Alcohol can interfere with your treatment.  Keep all follow-up visits as directed by your health care provider. This is important. It can help to prevent permanent injury, disability, and long-lasting (chronic)  pain. Activity  Stay in bed (on bed rest) only as directed by your health care provider. Being on bed rest for too long can make your condition worse.  Return to your normal activities as directed by your health care provider. Ask what activities are safe for you.  Do exercises to improve motion and strength in your back (physical therapy), as recommended by your health care provider.  Exercise regularly as directed by your health care provider. Contact a health care provider if:  You have a fever.  You develop a cough that makes your pain worse.  Your pain medicine is not helping.  Your pain does not get better over time.  You cannot return to your normal activities as planned or expected. Get help right away if:  Your pain is very bad and it suddenly gets worse.  You are unable to move any body part (paralysis) that is below the level of your injury.  You have numbness, tingling, or weakness in any body part that is below the level of your injury.  You cannot control your bladder or bowels. This information is not intended to replace advice given to you by your health care provider. Make sure you discuss any questions you have with your health care provider. Document Released: 07/26/2005 Document Revised: 03/23/2016 Document Reviewed: 07/30/2014 Elsevier Interactive Patient Education  [employer].

## 2017-07-29 NOTE — Assessment & Plan Note (Signed)
Recent bone density with osteoporosis. Strongly advised treatment, she will think about Prolia as she cannot tolerate bisphosphonate treatment. Continue calcium with vitamin D.

## 2017-07-29 NOTE — Progress Notes (Signed)
Subjective:    Patient ID: [place] Bowen, female    DOB: 1935/11/03, 81 y.o.   MRN: [id number]  HPI  Ms. Slabach is a 81 year old female with a history of osteoporosis who presents today for follow up of recently diagnosed compression fracture.  She was evaluated on 07/22/17 at [facility] for a chief complaint of back pain. She was sitting in the car, attempted to adjust herself in the seat when she suddenly felt a pop in her lower back with pain. She underwent plain films of the lumbar spine which reviewed a compression fracture to the L1 vertebra. She was advised to take Tylenol and follow up with PCP for further evaluation.   She is not currently on medication for osteoporosis as she is intolerant to bisphosphonates given history of GERD and refuses to try any. She's never tried Prolia.   She's continued to experience back pain. She's taking Advil 400 mg BID with some improvement. She's also not had a bowel movement in one week which is unusual for her as she's regular daily. She's passing gas. She's been eating salads and has been increasing water intake. She denies abdominal pain, loss of bowel or bladder control, numbness/tingling to the lower extremities.   Review of Systems  Constitutional: Negative for fever.  Gastrointestinal: Positive for constipation. Negative for abdominal pain and nausea.  Genitourinary:       No loss of bowel or bladder control  Musculoskeletal: Positive for back pain.       Past Medical History:  Diagnosis Date  . Arthritis   . Chickenpox   . GERD (gastroesophageal reflux disease)   . Hypertension   . Osteoporosis   . Type 2 diabetes mellitus ([place])   . Urinary tract infection      Social History   Socioeconomic History  . Marital status: Widowed    Spouse name: Not on file  . Number of children: Not on file  . Years of education: Not on file  . Highest education level: Not on file  Social Needs  . Financial resource strain: Not  on file  . Food insecurity - worry: Not on file  . Food insecurity - inability: Not on file  . Transportation needs - medical: Not on file  . Transportation needs - non-medical: Not on file  Occupational History  . Not on file  Tobacco Use  . Smoking status: Never Smoker  . Smokeless tobacco: Never Used  Substance and Sexual Activity  . Alcohol use: No  . Drug use: Not on file  . Sexual activity: Not on file  Other Topics Concern  . Not on file  Social History Narrative   Widowed.   Moved from [place].   1 son, 3 grandchildren.   Retired. Once worked as a Agricultural engineer.    Past Surgical History:  Procedure Laterality Date  . ABDOMINAL HYSTERECTOMY  1984  . APPENDECTOMY  1958  . GALLBLADDER SURGERY    . MANDIBLE FRACTURE SURGERY  07/13/2016  . TONSILLECTOMY  1958  . WRIST FRACTURE SURGERY Right    2000    Family History  Problem Relation Age of Onset  . Hypertension Mother   . Stroke Mother   . Stroke Father   . Hypertension Father   . Hypertension Sister   . Kidney disease Sister     Allergies  Allergen Reactions  . Penicillins Rash    Current Outpatient Medications on File Prior to Visit  Medication Sig  Dispense Refill  . diltiazem (TIAZAC) 300 MG 24 hr capsule Take 300 mg by mouth daily.    Marland Kitchen glimepiride (AMARYL) 2 MG tablet Take 2 mg by mouth daily with breakfast.    . losartan (COZAAR) 25 MG tablet TAKE 1 TABLET BY MOUTH EVERY DAY 90 tablet 0  . metFORMIN (GLUCOPHAGE) 500 MG tablet Take 500 mg by mouth 2 (two) times daily with a meal.    . Vitamin D, Ergocalciferol, (DRISDOL) 50000 units CAPS capsule Take 1 capsule (50,000 Units total) by mouth once a week. 12 capsule 1   No current facility-administered medications on file prior to visit.     BP (!) 142/76   Pulse 88   Temp 97.7 F (36.5 C) (Oral)   Wt 130 lb 8 oz (59.2 kg)   SpO2 96%   BMI 21.22 kg/m     Objective:   Physical Exam  Constitutional: She appears well-nourished.  Neck:  Neck supple.  Cardiovascular: Normal rate and regular rhythm.  Pulmonary/Chest: Effort normal and breath sounds normal.  Musculoskeletal:       Lumbar back: She exhibits decreased range of motion and pain. She exhibits no tenderness.       Back:  Skin: Skin is warm and dry.          Assessment & Plan:  Acute Back Pain:  Since 07/22/17, diagnosed with a "possible" L1 compression fracture. Exam today consistent for acute pain. No alarm signs. Will repeat xray given recommendations per Urgent Care to confirm if this is older or newer. Xray pending. Will have her increase Ibuprofen to 600 mg TID, purchase a back brace for stability, and heating pad for breakthrough pain. Discussed potential for PT for continued pain despite conservative treatment. Strongly advised treatment for osteoporosis, she declines today and will think about Prolia. She will continue calcium and vitamin D. She will update in 1-2 weeks.  Sheral Flow, NP

## 2017-08-01 ENCOUNTER — Ambulatory Visit (INDEPENDENT_AMBULATORY_CARE_PROVIDER_SITE_OTHER)
Admission: RE | Admit: 2017-08-01 | Discharge: 2017-08-01 | Disposition: A | Discharge status: Home / Self Care | Payer: Medicare Other | Source: Ambulatory Visit | Attending: Primary Care | Admitting: Primary Care

## 2017-08-01 DIAGNOSIS — M545 Low back pain, unspecified: Secondary | ICD-10-CM

## 2017-08-01 DIAGNOSIS — S32010A Wedge compression fracture of first lumbar vertebra, initial encounter for closed fracture: Secondary | ICD-10-CM | POA: Diagnosis not present

## 2017-08-03 ENCOUNTER — Ambulatory Visit: Payer: Medicare Other | Admitting: Primary Care

## 2017-08-23 ENCOUNTER — Telehealth: Payer: Self-pay | Admitting: Primary Care

## 2017-08-23 ENCOUNTER — Other Ambulatory Visit: Payer: Self-pay | Admitting: Primary Care

## 2017-08-23 NOTE — Telephone Encounter (Signed)
Please notify patient that I have no record of her currently taking lorazepam.  I also do not prescribe Lorazepam as it is an unsafe medication due to side effects and potential interactions with other medications.  If she is struggling with anxiety, I am happy to meet with her to discuss.

## 2017-08-23 NOTE — Telephone Encounter (Signed)
Last office visit 07-29-17

## 2017-08-23 NOTE — Telephone Encounter (Signed)
Copied from [place] [phone]. Topic: Quick Communication - Rx Refill/Question >> Aug 23, 2017 10:55 AM Oliver Pila B wrote: Medication: Lorazapam Has the patient contacted their pharmacy? Yes.    PT called b/c pharmacy states pt needs to have medication refilled but could not locate on file in med list, contact pharmacy to resolve and if needed contact pt as well  Preferred Pharmacy (with phone number or street name): [facility]  Agent: Please be advised that RX refills may take up to 3 business days. We ask that you follow-up with your pharmacy.

## 2017-08-25 NOTE — Telephone Encounter (Signed)
Spoken and notified patient of Kate's comments. Patient verbalized understanding.  Patient that she needs to discuss with Anda Kraft regarding medication and anxiety

## 2017-08-26 ENCOUNTER — Ambulatory Visit (INDEPENDENT_AMBULATORY_CARE_PROVIDER_SITE_OTHER): Payer: Medicare Other | Admitting: Primary Care

## 2017-08-26 ENCOUNTER — Encounter: Payer: Self-pay | Admitting: Primary Care

## 2017-08-26 VITALS — BP 166/70 | HR 90 | Temp 98.2°F | Ht 65.75 in | Wt 127.4 lb

## 2017-08-26 DIAGNOSIS — M4856XD Collapsed vertebra, not elsewhere classified, lumbar region, subsequent encounter for fracture with routine healing: Secondary | ICD-10-CM | POA: Insufficient documentation

## 2017-08-26 DIAGNOSIS — F419 Anxiety disorder, unspecified: Secondary | ICD-10-CM | POA: Insufficient documentation

## 2017-08-26 DIAGNOSIS — I10 Essential (primary) hypertension: Secondary | ICD-10-CM | POA: Diagnosis not present

## 2017-08-26 DIAGNOSIS — F32A Depression, unspecified: Secondary | ICD-10-CM | POA: Insufficient documentation

## 2017-08-26 DIAGNOSIS — F329 Major depressive disorder, single episode, unspecified: Secondary | ICD-10-CM

## 2017-08-26 NOTE — Assessment & Plan Note (Signed)
Continues to experience pain, some improved. Will have her continue Advil and start taking more regularly at 200 mg three times daily. Referral placed for physical therapy. No alarm signs.

## 2017-08-26 NOTE — Progress Notes (Signed)
Subjective:    Patient ID: [place] Bowen, female    DOB: 1935-09-12, 82 y.o.   MRN: [id number]  HPI  [name] is a 82 year old female who presents today to discuss anxiety.   She is currently managed on lorazepam 1 mg for which she takes at bedtime every other night. She's been taking this for the past several years but did not share this with [place] during her initial established patient appointment. She ran out of her medication one week ago and has not had since. She's not slept well. She does experience anxiety and some depression since the passing of her husband. Her daughter is with her today and is reporting symptoms. Her son will be leaving overseas for the next 4 months, her daughter is down from [place] and will be staying near her.   She took a benadryl capsule last night and was able to sleep. She's never tried anything OTC for her symptoms.  GAD 7 score of 3 and PHQ 9 score of 6 today.   2) Acute Back Pain: Since 07/22/17. Xray on 08/01/17 with compression fracture at L1. She's still experiencing pain which has prevented her from doing much. She's taking 200 mg of Advil infrequently with improvement. She continues to decline treatment for osteoporosis as recommended. She denies numbness/tingling, loss of bowel or bladder control.  Review of Systems  Genitourinary:       Denies loss of bowel or bladder control  Musculoskeletal: Positive for back pain.  Neurological: Negative for weakness and numbness.  Psychiatric/Behavioral: Positive for sleep disturbance.       See HPI       Past Medical History:  Diagnosis Date  . Arthritis   . Chickenpox   . GERD (gastroesophageal reflux disease)   . Hypertension   . Osteoporosis   . Type 2 diabetes mellitus ([place])   . Urinary tract infection      Social History   Socioeconomic History  . Marital status: Widowed    Spouse name: Not on file  . Number of children: Not on file  . Years of education: Not on file  . Highest education  level: Not on file  Social Needs  . Financial resource strain: Not on file  . Food insecurity - worry: Not on file  . Food insecurity - inability: Not on file  . Transportation needs - medical: Not on file  . Transportation needs - non-medical: Not on file  Occupational History  . Not on file  Tobacco Use  . Smoking status: Never Smoker  . Smokeless tobacco: Never Used  Substance and Sexual Activity  . Alcohol use: No  . Drug use: Not on file  . Sexual activity: Not on file  Other Topics Concern  . Not on file  Social History Narrative   Widowed.   Moved from [place].   1 son, 3 grandchildren.   Retired. Once worked as a Agricultural engineer.    Past Surgical History:  Procedure Laterality Date  . ABDOMINAL HYSTERECTOMY  1984  . APPENDECTOMY  1958  . GALLBLADDER SURGERY    . MANDIBLE FRACTURE SURGERY  07/13/2016  . TONSILLECTOMY  1958  . WRIST FRACTURE SURGERY Right    2000    Family History  Problem Relation Age of Onset  . Hypertension Mother   . Stroke Mother   . Stroke Father   . Hypertension Father   . Hypertension Sister   . Kidney disease Sister  Allergies  Allergen Reactions  . Penicillins Rash    Current Outpatient Medications on File Prior to Visit  Medication Sig Dispense Refill  . diltiazem (TIAZAC) 300 MG 24 hr capsule Take 300 mg by mouth daily.    Marland Kitchen glimepiride (AMARYL) 2 MG tablet Take 2 mg by mouth daily with breakfast.    . losartan (COZAAR) 25 MG tablet TAKE 1 TABLET BY MOUTH EVERY DAY 90 tablet 0  . metFORMIN (GLUCOPHAGE) 500 MG tablet Take 500 mg by mouth 2 (two) times daily with a meal.    . ONE TOUCH ULTRA TEST test strip USE AS DIRECTED TEST 3 TIMES DAILY E11.9 100 each 2  . Vitamin D, Ergocalciferol, (DRISDOL) 50000 units CAPS capsule Take 1 capsule (50,000 Units total) by mouth once a week. 12 capsule 1   No current facility-administered medications on file prior to visit.     BP (!) 166/70   Pulse 90   Temp 98.2 F (36.8 C)  (Oral)   Ht 5' 5.75" (1.67 m)   Wt 127 lb 6.4 oz (57.8 kg)   SpO2 97%   BMI 20.72 kg/m    Objective:   Physical Exam  Constitutional: She appears well-nourished.  Neck: Neck supple.  Cardiovascular: Normal rate and regular rhythm.  Pulmonary/Chest: Effort normal and breath sounds normal.  Skin: Skin is warm and dry.  Psychiatric: She has a normal mood and affect.  Some tearfulness during exam          Assessment & Plan:

## 2017-08-26 NOTE — Assessment & Plan Note (Signed)
Symptoms of both, overall mild. Do suspect that she is having difficulty grieving. Offered referral for therapy for which she declines. Will have her start melatonin at 3 mg one hour prior to bedtime for sleep.  Follow up in 1 month.

## 2017-08-26 NOTE — Patient Instructions (Addendum)
You will be contacted regarding your referral to physical therapy.  Please let us know if you have not been contacted within one week.   Start aspirin 81 mg once daily.  You may take Advil 200 mg every 8 hours as needed for pain.  Try Melatonin for difficult sleeping. Start with 3 mg and take one hour prior to bedtime. Do not exceed 10 mg total.  Schedule a follow up visit for diabetes and back pain in one month. Tell the ladies this needs to be a 30 minute visit. Tell them to schedule you a lab appointment 1-2 days prior, make sure you are fasting when you have your labs done.  It was a pleasure to see you today!

## 2017-08-26 NOTE — Assessment & Plan Note (Addendum)
Above goal during the visit today, endorses home readings of 120-130/70's. Will have her continue to monitor and report readings at or above 140/90.  Will have her start aspirin 81 mg for arteriosclerosis.

## 2017-08-27 ENCOUNTER — Emergency Department
Admission: EM | Admit: 2017-08-27 | Discharge: 2017-08-27 | Disposition: A | Discharge status: Home / Self Care | Payer: Medicare Other | Attending: Emergency Medicine | Admitting: Emergency Medicine

## 2017-08-27 DIAGNOSIS — E119 Type 2 diabetes mellitus without complications: Secondary | ICD-10-CM | POA: Diagnosis not present

## 2017-08-27 DIAGNOSIS — I160 Hypertensive urgency: Secondary | ICD-10-CM | POA: Diagnosis not present

## 2017-08-27 DIAGNOSIS — F419 Anxiety disorder, unspecified: Secondary | ICD-10-CM | POA: Diagnosis not present

## 2017-08-27 DIAGNOSIS — Z7984 Long term (current) use of oral hypoglycemic drugs: Secondary | ICD-10-CM | POA: Insufficient documentation

## 2017-08-27 DIAGNOSIS — I10 Essential (primary) hypertension: Secondary | ICD-10-CM | POA: Diagnosis present

## 2017-08-27 DIAGNOSIS — Z9049 Acquired absence of other specified parts of digestive tract: Secondary | ICD-10-CM | POA: Insufficient documentation

## 2017-08-27 DIAGNOSIS — Z79899 Other long term (current) drug therapy: Secondary | ICD-10-CM | POA: Insufficient documentation

## 2017-08-27 HISTORY — DX: Type 2 diabetes mellitus without complications: E11.9

## 2017-08-27 HISTORY — DX: Anxiety disorder, unspecified: F41.9

## 2017-08-27 LAB — CBC WITH DIFFERENTIAL/PLATELET
BASOS ABS: 0.1 10*3/uL (ref 0–0.1)
BASOS PCT: 1 %
EOS PCT: 2 %
Eosinophils Absolute: 0.1 10*3/uL (ref 0–0.7)
HCT: 39.5 % (ref 35.0–47.0)
Hemoglobin: 13.4 g/dL (ref 12.0–16.0)
Lymphocytes Relative: 34 %
Lymphs Abs: 2.5 10*3/uL (ref 1.0–3.6)
MCH: 31 pg (ref 26.0–34.0)
MCHC: 34 g/dL (ref 32.0–36.0)
MCV: 91 fL (ref 80.0–100.0)
MONO ABS: 0.6 10*3/uL (ref 0.2–0.9)
MONOS PCT: 9 %
Neutro Abs: 4 10*3/uL (ref 1.4–6.5)
Neutrophils Relative %: 54 %
PLATELETS: 252 10*3/uL (ref 150–440)
RBC: 4.34 MIL/uL (ref 3.80–5.20)
RDW: 13.8 % (ref 11.5–14.5)
WBC: 7.3 10*3/uL (ref 3.6–11.0)

## 2017-08-27 LAB — COMPREHENSIVE METABOLIC PANEL
ALT: 16 U/L (ref 14–54)
ANION GAP: 11 (ref 5–15)
AST: 20 U/L (ref 15–41)
Albumin: 4.5 g/dL (ref 3.5–5.0)
Alkaline Phosphatase: 93 U/L (ref 38–126)
BUN: 16 mg/dL (ref 6–20)
CHLORIDE: 105 mmol/L (ref 101–111)
CO2: 23 mmol/L (ref 22–32)
Calcium: 9.4 mg/dL (ref 8.9–10.3)
Creatinine, Ser: 0.81 mg/dL (ref 0.44–1.00)
GFR calc Af Amer: >60 mL/min (ref >60)
GFR calc non Af Amer: >60 mL/min (ref >60)
Glucose, Bld: 161 mg/dL — ABNORMAL HIGH (ref 65–99)
POTASSIUM: 4 mmol/L (ref 3.5–5.1)
Sodium: 139 mmol/L (ref 135–145)
TOTAL PROTEIN: 8.2 g/dL — AB (ref 6.5–8.1)
Total Bilirubin: 0.7 mg/dL (ref 0.3–1.2)

## 2017-08-27 LAB — TROPONIN I: Troponin I: <0.03 ng/mL (ref <0.03)

## 2017-08-27 MED ORDER — IBUPROFEN 400 MG PO TABS
400.0000 mg | ORAL_TABLET | ORAL | Status: AC
  Administered 2017-08-27: 400 mg via ORAL
  Filled 2017-08-27: qty 1

## 2017-08-27 MED ORDER — LOSARTAN POTASSIUM 50 MG PO TABS
25.0000 mg | ORAL_TABLET | ORAL | Status: AC
  Administered 2017-08-27: 25 mg via ORAL
  Filled 2017-08-27: qty 1

## 2017-08-27 MED ORDER — LOSARTAN POTASSIUM 25 MG PO TABS: 50.0000 mg | ORAL_TABLET | Freq: Every day | ORAL | 0 refills | Status: DC

## 2017-08-27 NOTE — ED Provider Notes (Signed)
[facility] Emergency Department Provider Note   ____________________________________________   First MD Initiated Contact with Patient 08/27/17 1738     (approximate)  I have reviewed the triage vital signs and the nursing notes.   HISTORY  Chief Complaint Hypertension   HPI [place] Bowen is a 82 y.o. female reports a history of diabetes anxiety and hypertension  She recently moved from [place], she reports that until 1 week ago she was on lorazepam 1 mg every night.  She ran out of this prescription, saw her doctor yesterday and they did not wish to continue her on it.  They also noticed at that clinic appointment according to her and her daughter that her blood pressure was quite high and they have switched her over to losartan 25 mg daily  Today she checked her blood pressure, she noticed it was quite high.  She called the nurse line and they prompted her to come to the hospital.  She denies any symptoms.  No headache nausea or vomiting.  She reports that she slept terribly for the first 4-5 nights after stopping Ativan, but started taking melatonin in the evening last night and did sleep for 5-6 hours last night.  Best night she has had in a week.  No tremors or seizures.  She has felt very anxious since stopping her Lorazepam but this is slowly getting better, but she reports being under significant stress due to the death of her husband recently and moving from [place] just recently.  No history of cerebral aneurysm though she reports intracranial hemorrhage has run strongly in her father who died at age 77.  No trouble speaking.  No trouble walking.  No numbness or tingling.  No chest pain.  Change in vision  Past Medical History:  Diagnosis Date  . Anxiety   . Diabetes mellitus without complication ([place])   . Hypertension     There are no active problems to display for this patient.   Past Surgical History:  Procedure Laterality Date    . ABDOMINAL HYSTERECTOMY    . CHOLECYSTECTOMY    . TONSILLECTOMY      Prior to Admission medications   Medication Sig Start Date End Date Taking? Authorizing Provider  diltiazem (TIAZAC) 300 MG 24 hr capsule Take 300 mg by mouth daily.   Yes [provider]  glimepiride (AMARYL) 2 MG tablet Take 2 mg by mouth daily with breakfast.   Yes [provider]  metFORMIN (GLUCOPHAGE) 500 MG tablet Take by mouth 2 (two) times daily with a meal.   Yes [provider]  Vitamin D, Ergocalciferol, (DRISDOL) 50000 units CAPS capsule Take 50,000 Units by mouth every 7 (seven) days.   Yes [provider]  losartan (COZAAR) 25 MG tablet Take 2 tablets (50 mg total) by mouth daily. 08/27/17   Delman Kitten, MD    Allergies Patient has no known allergies.  No family history on file.  Social History Social History   Tobacco Use  . Smoking status: Never Smoker  Substance Use Topics  . Alcohol use: No    Frequency: Never  . Drug use: No    Review of Systems Constitutional: No fever/chills Eyes: No visual changes. ENT: No sore throat. Cardiovascular: Denies chest pain. Respiratory: Denies shortness of breath. Gastrointestinal: No abdominal pain.  No nausea, no vomiting.   Genitourinary: Negative for dysuria. Musculoskeletal: Negative for back pain. Skin: Negative for rash. Neurological: Negative for headaches, focal weakness or  numbness. No hallucinations.  No confusion.   ____________________________________________   PHYSICAL EXAM:  VITAL SIGNS: ED Triage Vitals [08/27/17 1701]  Enc Vitals Group     BP (!) 209/80     Pulse Rate 96     Resp 18     Temp 98.3 F (36.8 C)     Temp Source Oral     SpO2 95 %     Weight 125 lb (56.7 kg)     Height 5\' 5"  (1.651 m)     Head Circumference      Peak Flow      Pain Score      Pain Loc      Pain Edu?      Excl. in [place]?     Constitutional: Alert and oriented. Well appearing and in no acute distress  though she does appear somewhat anxious. Eyes: Conjunctivae are normal. Head: Atraumatic. Nose: No congestion/rhinnorhea. Mouth/Throat: Mucous membranes are moist. Neck: No stridor.   Cardiovascular: Normal rate, regular rhythm. Grossly normal heart sounds.  Good peripheral circulation. Respiratory: Normal respiratory effort.  No retractions. Lungs CTAB. Gastrointestinal: Soft and nontender. No distention. Musculoskeletal: No lower extremity tenderness nor edema.  Moves all extremities 5 out of 5 strength. Neurologic:  Normal speech and language. No gross focal neurologic deficits are appreciated.  Normal cranial nerve exam.  Clear speech.  No sensory deficits. Skin:  Skin is warm, dry and intact.  Psychiatric: Mood and affect are slightly anxious. Speech and behavior are normal.  ____________________________________________   LABS (all labs ordered are listed, but only abnormal results are displayed)  Labs Reviewed  COMPREHENSIVE METABOLIC PANEL - Abnormal; Notable for the following components:      Result Value   Glucose, Bld 161 (*)    Total Protein 8.2 (*)    All other components within normal limits  CBC WITH DIFFERENTIAL/PLATELET  TROPONIN I   ____________________________________________  EKG  Reviewed and to by me at 1710 Heart rate 95 QRS 80 QTC 440 Normal sinus rhythm, no evidence of ischemia or ectopy ____________________________________________  RADIOLOGY  No indication for imaging ____________________________________________   PROCEDURES  Procedure(s) performed: None  Procedures  Critical Care performed: No  ____________________________________________   INITIAL IMPRESSION / ASSESSMENT AND PLAN / ED COURSE  Pertinent labs & imaging results that were available during my care of the patient were reviewed by me and considered in my medical decision making (see chart for details).  Patient presents for evaluation of elevated hypertension.  She  reports elevated hypertension at the doctor's office yesterday, also in the setting of recently stopping Lorazepam and sounds like she went through some moderate to mild withdrawal symptoms which are now starting to improve and she is finally sleeping.    ----------------------------------------- 6:47 PM on 08/27/2017 -----------------------------------------  Patient given Motrin for chronic low back pain.  In addition, patient blood pressure now improved to 190/93.  This does represent a mild decrease from her baseline on arrival, I am comfortable with this and discussed with the patient and her family will increase her losartan dose to 50 mg once daily and she will follow-up closely with her primary.  Return precautions and treatment recommendations and follow-up discussed with the patient who is agreeable with the plan.   ____________________________________________   FINAL CLINICAL IMPRESSION(S) / ED DIAGNOSES  Final diagnoses:  Hypertensive urgency      NEW MEDICATIONS STARTED DURING THIS VISIT:  Current Discharge Medication List       Note:  This document was prepared using Dragon voice recognition software and may include unintentional dictation errors.     Delman Kitten, MD 08/27/17 [phone]

## 2017-08-27 NOTE — ED Triage Notes (Addendum)
Pt came to ED via pov c/o hypertension. Pt takes losartan and diltiazem. Bp in triage 209/80. Pt denies pain. Pt reports she believes it may be related to her anxiety, just recently moved here and has not been able to get her diazepam filled

## 2017-08-27 NOTE — Discharge Instructions (Signed)
Please note, will be changing her dose of losartan to 50 mg once daily instead of the previous 25 mg dose.  Follow-up closely with your primary doctor.  Continue to track and log her blood pressures at home.

## 2017-08-29 ENCOUNTER — Telehealth: Payer: Self-pay

## 2017-08-29 ENCOUNTER — Encounter: Payer: Self-pay | Admitting: Primary Care

## 2017-08-29 NOTE — Telephone Encounter (Signed)
Okay to wait until 08/30/17. Is she taking the increased dose of the Losartan 50 mg? Is she compliant to her diltiazem 300 mg?

## 2017-08-29 NOTE — Telephone Encounter (Signed)
PLEASE NOTE: All timestamps contained within this report are represented as [place] Standard Time. CONFIDENTIALTY NOTICE: This fax transmission is intended only for the addressee. It contains information that is legally privileged, confidential or otherwise protected from use or disclosure. If you are not the intended recipient, you are strictly prohibited from reviewing, disclosing, copying using or disseminating any of this information or taking any action in reliance on or regarding this information. If you have received this fax in error, please notify us immediately by telephone so that we can arrange for its return to [place]. Phone: [phone], Toll-Free: [phone], Fax: [phone] Page: 1 of 2 Call Id: [id number] [place] Patient Name: Morgan Bowen Gender: Female DOB: 02/16/1936 Age: 82 Y 44 M 15 D Return Phone Number: [phone] (Primary), [phone] (Secondary) Address: City/State/Zip: [place] [address] Client [facility] Night - Client Client Site [place] Physician [name] - NP Contact Type Call Who Is Calling Patient / Member / Family / Caregiver Call Type Triage / Clinical Caller Name Yury Schaus Relationship To Patient Son Return Phone Number [phone] (Primary) Chief Complaint Blood Pressure High Reason for Call Symptomatic / Request for Health Information Initial Comment Caller states mother saw Dr on Fri, little adjustment to her medicine. Last night her bp and blood sugar was high, went to the ER and double up her bp medicine. Translation No Nurse Assessment Nurse: Laurena Bering, RN, Helene Kelp Date/Time Eilene Ghazi Time): 08/28/2017 1:46:42 PM Confirm and document reason for call. If symptomatic, describe symptoms. ---Caller states that her blood pressure is 186/76, 197/87. May be in correct. Took Losartan 50 mg  . ER MD increase to 50 mg for 3-5 days. Denies HA and dizziness, blurry vision. Blood pressure was changed one month ago due to contamination in previous medication. Does the patient have any new or worsening symptoms? ---Yes Will a triage be completed? ---Yes Related visit to physician within the last 2 weeks? ---Yes Does the PT have any chronic conditions? (i.e. diabetes, asthma, etc.) ---Yes List chronic conditions. ---diabetes Is this a behavioral health or substance abuse call? ---No Guidelines Guideline Title Affirmed Question Affirmed Notes Nurse Date/Time (Eastern Time) High Blood Pressure Systolic BP >= 416 OR Diastolic >= 606 Laurena Bering, RN, Helene Kelp 08/28/2017 1:50:25 PM Disp. Time Eilene Ghazi Time) Disposition Final User 08/28/2017 1:52:51 PM See PCP When Office is Open (within 3 days) Yes Laurena Bering, RN, Helene Kelp PLEASE NOTE: All timestamps contained within this report are represented as [place] Standard Time. CONFIDENTIALTY NOTICE: This fax transmission is intended only for the addressee. It contains information that is legally privileged, confidential or otherwise protected from use or disclosure. If you are not the intended recipient, you are strictly prohibited from reviewing, disclosing, copying using or disseminating any of this information or taking any action in reliance on or regarding this information. If you have received this fax in error, please notify us immediately by telephone so that we can arrange for its return to [place]. Phone: [phone], Toll-Free: [phone], Fax: [phone] Page: 2 of 2 Call Id: [id number] [place] Disagree/Comply Comply Caller Understands Yes PreDisposition Call Doctor Care Advice Given Per Guideline SEE PCP WITHIN 3 DAYS: HIGH BLOOD PRESSURE: * Untreated high blood pressure may cause damage to your heart, brain, kidneys, and eyes. * DECREASE SODIUM INTAKE: Aim to eat less than 2.4 g (100 mmol) of sodium each day. Unfortunately 75% of the salt in  the  average person's diet is in pre-processed foods. * EAT HEALTHY: Eat a diet rich in fresh fruits and vegetables, dietary fiber, non-animal protein (e.g., soy), and low-fat dairy products. Avoid foods with a high content of saturated fat or cholesterol. * LIMIT ALCOHOL: Limit alcohol to 0-2 standard drinks each day. Men should have less than 14 drinks per week. Women should have less than 9 drinks per week. A drink is 1.5 oz hard liquor (one shot or jigger; 45 ml), 5 oz wine (small glass; 150 ml), 12 oz beer (one can; 360 ml). CALL BACK IF: * Weakness or numbness of the face, arm or leg on one side of the body occurs * Difficulty walking, difficulty talking, or severe headache occurs * Chest pain or difficulty breathing occurs * Your blood pressure is over 180/110 * You become worse. CARE ADVICE given per High Blood Pressure (Adult) guideline. Referrals REFERRED TO PCP OFFICE

## 2017-08-29 NOTE — Telephone Encounter (Signed)
I spoke with Morgan Bowen and Morgan Bowen was seen by Gentry Fitz NP on 08/26/17 and on 08/27/17 at [facility] ED. Today BP 176/70 P 92. Morgan Bowen recked BP 181/76 P 88. No CP,SOB,H/A or dizziness. Sometimes Morgan Bowen feels like heart is pounding. Morgan Bowen having back pain at pain level 5. Morgan Bowen scheduled 30' ED f/u 08/30/17 at 11:45. Morgan Bowen wants Allie Bossier NP to review and request cb to see if Anda Kraft thinks OK to wait until 08/30/17. Offered appt with different provider today but Morgan Bowen prefers to see Anda Kraft. Morgan Bowen will cb prior to cb from Allie Bossier NP office if condition changes or worsens.Please advise.

## 2017-08-30 ENCOUNTER — Ambulatory Visit (INDEPENDENT_AMBULATORY_CARE_PROVIDER_SITE_OTHER): Payer: Medicare Other | Admitting: Primary Care

## 2017-08-30 ENCOUNTER — Encounter: Payer: Self-pay | Admitting: Primary Care

## 2017-08-30 VITALS — BP 146/60 | HR 73 | Temp 98.0°F | Ht 65.75 in | Wt 127.1 lb

## 2017-08-30 DIAGNOSIS — F329 Major depressive disorder, single episode, unspecified: Secondary | ICD-10-CM | POA: Diagnosis not present

## 2017-08-30 DIAGNOSIS — I10 Essential (primary) hypertension: Secondary | ICD-10-CM

## 2017-08-30 DIAGNOSIS — F419 Anxiety disorder, unspecified: Secondary | ICD-10-CM

## 2017-08-30 MED ORDER — LOSARTAN POTASSIUM 50 MG PO TABS: 50.0000 mg | ORAL_TABLET | Freq: Every day | ORAL | 1 refills | Status: DC

## 2017-08-30 NOTE — Assessment & Plan Note (Signed)
Suspect recent anxiety symptoms are secondary to withdrawal from chronic benzo use. Continue off of lorazepam, continue melatonin.  Will continue to monitor symptoms and consider low dose Zoloft if needed.

## 2017-08-30 NOTE — Progress Notes (Signed)
Subjective:    Patient ID: Morgan Bowen, female    DOB: 1936-03-03, 82 y.o.   MRN: [id number]  HPI  Morgan Bowen is a 82 year old female who presents today for emergency department follow up.  She was last evaluated in our office on 08/26/17 with complaints of anxiety and insomnia. She had been without her lorazepam for one week. Apparently she had been taking this at bedtime for the last several years for which was never disclosed to our office. She was one week off of her lorazepam so she was encouraged to try Melatonin for insomnia. She denied symptoms of anxiety. Her BP was noted to be above goal but she endorsed home readings of 120-130/70's.   She presented to [facility] ED on 08/27/17 with reports of hypertension. She admitted that she hadn't slept well for the last several nights but did get better sleep after initiation of Melatonin. Her BP was noted to be quite high in the emergency department with a decrease to 190/90 upon discharge. She underwent lab work including Troponin which was negative. ECG unremarkable. She was discharged home with instructions to increase her Losartan to 50 mg.   Since her visit in the emergency department she's doubled her Losartan to equal 50 mg. She's checking her BP at home over the last several days with her recent reading this morning of 170/61. She does admit to feeling anxious, jittery since she's not taken her lorazepam but thinks this is improving. She's sleeping better since starting Melatonin. She denies chest pain, dizziness, shortness of breath.   Review of Systems  Constitutional: Negative for fatigue.  Respiratory: Negative for shortness of breath.   Cardiovascular: Negative for chest pain.  Neurological: Negative for dizziness and headaches.  Psychiatric/Behavioral:       Some anxiety since off lorazepam.        Past Medical History:  Diagnosis Date  . Anxiety   . Arthritis   . Chickenpox   . Diabetes mellitus without complication ([place])   .  GERD (gastroesophageal reflux disease)   . Hypertension   . Osteoporosis   . Type 2 diabetes mellitus ([place])   . Urinary tract infection      Social History   Socioeconomic History  . Marital status: Widowed    Spouse name: Not on file  . Number of children: Not on file  . Years of education: Not on file  . Highest education level: Not on file  Social Needs  . Financial resource strain: Not on file  . Food insecurity - worry: Not on file  . Food insecurity - inability: Not on file  . Transportation needs - medical: Not on file  . Transportation needs - non-medical: Not on file  Occupational History  . Not on file  Tobacco Use  . Smoking status: Never Smoker  . Smokeless tobacco: Never Used  Substance and Sexual Activity  . Alcohol use: No    Frequency: Never  . Drug use: No  . Sexual activity: Not on file  Other Topics Concern  . Not on file  Social History Narrative   ** Merged History Encounter **       Widowed. Moved from [place]. 1 son, 3 grandchildren. Retired. Once worked as a Agricultural engineer.     Past Surgical History:  Procedure Laterality Date  . ABDOMINAL HYSTERECTOMY  1984  . ABDOMINAL HYSTERECTOMY    . APPENDECTOMY  1958  . CHOLECYSTECTOMY    . GALLBLADDER SURGERY    .  MANDIBLE FRACTURE SURGERY  07/13/2016  . TONSILLECTOMY  1958  . TONSILLECTOMY    . WRIST FRACTURE SURGERY Right    2000    Family History  Problem Relation Age of Onset  . Hypertension Mother   . Stroke Mother   . Stroke Father   . Hypertension Father   . Hypertension Sister   . Kidney disease Sister     Allergies  Allergen Reactions  . Sulfamethoxazole-Trimethoprim   . Penicillins Rash    Current Outpatient Medications on File Prior to Visit  Medication Sig Dispense Refill  . diltiazem (TIAZAC) 300 MG 24 hr capsule Take 300 mg by mouth daily.    Marland Kitchen glimepiride (AMARYL) 2 MG tablet Take 2 mg by mouth daily with breakfast.    . metFORMIN (GLUCOPHAGE) 500 MG tablet  Take 500 mg by mouth 2 (two) times daily with a meal.    . ONE TOUCH ULTRA TEST test strip USE AS DIRECTED TEST 3 TIMES DAILY E11.9 100 each 2  . Vitamin D, Ergocalciferol, (DRISDOL) 50000 units CAPS capsule Take 1 capsule (50,000 Units total) by mouth once a week. 12 capsule 1   No current facility-administered medications on file prior to visit.     BP (!) 146/60   Pulse 73   Temp 98 F (36.7 C) (Oral)   Ht 5' 5.75" (1.67 m)   Wt 127 lb 1.9 oz (57.7 kg)   SpO2 98%   BMI 20.67 kg/m    Objective:   Physical Exam  Constitutional: She appears well-nourished.  Neck: Neck supple.  Cardiovascular: Normal rate and regular rhythm.  Pulmonary/Chest: Effort normal and breath sounds normal.  Skin: Skin is warm and dry.  Psychiatric: She has a normal mood and affect.  GAD 7 score of 7 today. Doesn't appear anxious during exam.          Assessment & Plan:

## 2017-08-30 NOTE — Telephone Encounter (Signed)
Noted. Unable to addressed this until visit.

## 2017-08-30 NOTE — Assessment & Plan Note (Signed)
Improving based off home readings, stable in the office today. Rx for Losartan 50 mg sent to pharmacy, continue 50 mg dose. Check BMP next visit given increased dose of Losartan.  All ED notes and labs reviewed.

## 2017-08-30 NOTE — Patient Instructions (Signed)
Continue taking Losartan 50 mg once daily. I did send a prescription for the 50 mg dose to your pharmacy, make sure to take one tablet by mouth once daily.  Continue Melatonin nightly for sleep.  We will see you in February as scheduled.  It was a pleasure to see you today!

## 2017-09-06 DIAGNOSIS — M545 Low back pain: Secondary | ICD-10-CM | POA: Diagnosis not present

## 2017-09-08 DIAGNOSIS — M545 Low back pain: Secondary | ICD-10-CM | POA: Diagnosis not present

## 2017-09-13 DIAGNOSIS — M545 Low back pain: Secondary | ICD-10-CM | POA: Diagnosis not present

## 2017-09-15 DIAGNOSIS — M545 Low back pain: Secondary | ICD-10-CM | POA: Diagnosis not present

## 2017-09-16 ENCOUNTER — Other Ambulatory Visit: Payer: Self-pay | Admitting: Primary Care

## 2017-09-16 DIAGNOSIS — I10 Essential (primary) hypertension: Secondary | ICD-10-CM

## 2017-09-16 DIAGNOSIS — E119 Type 2 diabetes mellitus without complications: Secondary | ICD-10-CM

## 2017-09-20 ENCOUNTER — Other Ambulatory Visit (INDEPENDENT_AMBULATORY_CARE_PROVIDER_SITE_OTHER): Payer: Medicare Other

## 2017-09-20 DIAGNOSIS — I10 Essential (primary) hypertension: Secondary | ICD-10-CM

## 2017-09-20 DIAGNOSIS — M545 Low back pain: Secondary | ICD-10-CM | POA: Diagnosis not present

## 2017-09-20 DIAGNOSIS — E119 Type 2 diabetes mellitus without complications: Secondary | ICD-10-CM

## 2017-09-20 LAB — BASIC METABOLIC PANEL
BUN: 14 mg/dL (ref 6–23)
CALCIUM: 9.5 mg/dL (ref 8.4–10.5)
CO2: 25 mEq/L (ref 19–32)
CREATININE: 0.91 mg/dL (ref 0.40–1.20)
Chloride: 104 mEq/L (ref 96–112)
GFR: 63.28 mL/min (ref >60.00)
Glucose, Bld: 136 mg/dL — ABNORMAL HIGH (ref 70–99)
Potassium: 4.6 mEq/L (ref 3.5–5.1)
SODIUM: 138 meq/L (ref 135–145)

## 2017-09-20 LAB — HEMOGLOBIN A1C: Hgb A1c MFr Bld: 6.5 % (ref 4.6–6.5)

## 2017-09-22 ENCOUNTER — Other Ambulatory Visit: Payer: Medicare Other

## 2017-09-22 DIAGNOSIS — M545 Low back pain: Secondary | ICD-10-CM | POA: Diagnosis not present

## 2017-09-26 ENCOUNTER — Ambulatory Visit (INDEPENDENT_AMBULATORY_CARE_PROVIDER_SITE_OTHER): Payer: Medicare Other | Admitting: Primary Care

## 2017-09-26 ENCOUNTER — Encounter: Payer: Self-pay | Admitting: Primary Care

## 2017-09-26 VITALS — BP 140/66 | HR 78 | Temp 97.8°F | Ht 65.75 in | Wt 124.8 lb

## 2017-09-26 DIAGNOSIS — F329 Major depressive disorder, single episode, unspecified: Secondary | ICD-10-CM

## 2017-09-26 DIAGNOSIS — I10 Essential (primary) hypertension: Secondary | ICD-10-CM

## 2017-09-26 DIAGNOSIS — M4856XD Collapsed vertebra, not elsewhere classified, lumbar region, subsequent encounter for fracture with routine healing: Secondary | ICD-10-CM | POA: Diagnosis not present

## 2017-09-26 DIAGNOSIS — F419 Anxiety disorder, unspecified: Secondary | ICD-10-CM

## 2017-09-26 DIAGNOSIS — E119 Type 2 diabetes mellitus without complications: Secondary | ICD-10-CM | POA: Diagnosis not present

## 2017-09-26 DIAGNOSIS — M810 Age-related osteoporosis without current pathological fracture: Secondary | ICD-10-CM

## 2017-09-26 DIAGNOSIS — F32A Depression, unspecified: Secondary | ICD-10-CM

## 2017-09-26 MED ORDER — VITAMIN D (ERGOCALCIFEROL) 1.25 MG (50000 UNIT) PO CAPS: 50000.0000 [IU] | ORAL_CAPSULE | ORAL | 3 refills | Status: DC

## 2017-09-26 MED ORDER — LOSARTAN POTASSIUM 100 MG PO TABS
100.0000 mg | ORAL_TABLET | Freq: Every day | ORAL | 1 refills | Status: DC
Start: 2017-09-26 — End: 2018-03-30

## 2017-09-26 MED ORDER — DILTIAZEM HCL ER BEADS 300 MG PO CP24: 300.0000 mg | ORAL_CAPSULE | Freq: Every day | ORAL | 3 refills | Status: DC

## 2017-09-26 NOTE — Progress Notes (Signed)
Subjective:    Patient ID: Morgan Bowen, female    DOB: 18-Apr-1936, 82 y.o.   MRN: [id number]  HPI  Morgan Bowen is a 82 year old female who presents today for follow up.  1) Essential Hypertension: Currently managed on Losartan 50 mg which was incresaed about one month ago from 25 mg. She is also managed on diltiazem 300 mg from her prior PCP.   She's checking her BP at home which is running 140's-160's/60-80's. She denies dizziness, chest pain.   BP Readings from Last 3 Encounters:  09/26/17 140/66  08/30/17 (!) 146/60  08/27/17 (!) 182/69     2) Type 2 Diabetes:   Current medications include: Glimepiride 2 mg and metformin 500 mg BID.   She is checking her blood glucose several times weekly and is getting readings of: AM fasting: 120-130's.   Last A1C: 6.5 in February 2019 Last Eye Exam: Completed in December 2018 Last Foot Exam: Completed in October 2018 Pneumonia Vaccination: Completed pneumovax in 2015 ACE/ARB: ARB Statin: None  3) Compression Fracture of Lumbar Spine: History of osteoporosis, recent bone density can confirming. She refused prescription treatment for osteoporosis, is managed on vitamin D 50,000 units once weekly.   She continues to experience right lower back pain that will aggravate her in the late morning/early afternoon, last for several hours then dissipate. She describes her pain as achy. She's taking Advil 400 mg everyday with improvement. She's undergoing physical therapy and feels as though this has been beneficial. She denies numbness/tingling, weakness, loss of bowel or bladder control.     Review of Systems  Respiratory: Negative for shortness of breath.   Cardiovascular: Negative for chest pain.  Musculoskeletal: Positive for back pain.  Neurological: Negative for dizziness, weakness, numbness and headaches.       Past Medical History:  Diagnosis Date  . Anxiety   . Arthritis   . Chickenpox   . Diabetes mellitus without complication  (Morgan Bowen)   . GERD (gastroesophageal reflux disease)   . Hypertension   . Osteoporosis   . Type 2 diabetes mellitus (Morgan Bowen)   . Urinary tract infection      Social History   Socioeconomic History  . Marital status: Widowed    Spouse name: Not on file  . Number of children: Not on file  . Years of education: Not on file  . Highest education level: Not on file  Social Needs  . Financial resource strain: Not on file  . Food insecurity - worry: Not on file  . Food insecurity - inability: Not on file  . Transportation needs - medical: Not on file  . Transportation needs - non-medical: Not on file  Occupational History  . Not on file  Tobacco Use  . Smoking status: Never Smoker  . Smokeless tobacco: Never Used  Substance and Sexual Activity  . Alcohol use: No    Frequency: Never  . Drug use: No  . Sexual activity: Not on file  Other Topics Concern  . Not on file  Social History Narrative   ** Merged History Encounter **       Widowed. Moved from [place]. 1 son, 3 grandchildren. Retired. Once worked as a Agricultural engineer.     Past Surgical History:  Procedure Laterality Date  . ABDOMINAL HYSTERECTOMY  1984  . ABDOMINAL HYSTERECTOMY    . APPENDECTOMY  1958  . CHOLECYSTECTOMY    . GALLBLADDER SURGERY    . MANDIBLE FRACTURE SURGERY  07/13/2016  .  TONSILLECTOMY  1958  . TONSILLECTOMY    . WRIST FRACTURE SURGERY Right    2000    Family History  Problem Relation Age of Onset  . Hypertension Mother   . Stroke Mother   . Stroke Father   . Hypertension Father   . Hypertension Sister   . Kidney disease Sister     Allergies  Allergen Reactions  . Sulfamethoxazole-Trimethoprim   . Penicillins Rash    Current Outpatient Medications on File Prior to Visit  Medication Sig Dispense Refill  . glimepiride (AMARYL) 2 MG tablet Take 2 mg by mouth daily with breakfast.    . metFORMIN (GLUCOPHAGE) 500 MG tablet Take 500 mg by mouth 2 (two) times daily with a meal.    . ONE  TOUCH ULTRA TEST test strip USE AS DIRECTED TEST 3 TIMES DAILY E11.9 100 each 2   No current facility-administered medications on file prior to visit.     BP 140/66   Pulse 78   Temp 97.8 F (36.6 C) (Oral)   Ht 5' 5.75" (1.67 m)   Wt 124 lb 12 oz (56.6 kg)   SpO2 98%   BMI 20.29 kg/m    Objective:   Physical Exam  Constitutional: She appears well-nourished.  Neck: Neck supple.  Cardiovascular: Normal rate and regular rhythm.  Pulmonary/Chest: Effort normal and breath sounds normal.  Musculoskeletal:  Overall good ROM, walks with cane without difficulty  Skin: Skin is warm and dry.  Psychiatric: She has a normal mood and affect.          Assessment & Plan:

## 2017-09-26 NOTE — Patient Instructions (Addendum)
Continue Metformin twice daily and glimepiride once daily.  You will be contacted regarding your referral to Podiatry.  Please let us know if you have not been contacted within one week.   We've increased your Losartan from 50 mg to 100. You may take two of the 50 mg tablets to equal 100 mg until your bottle is empty.  Continue to monitor your blood pressure and notify if readings are at or above 140/90 on a consistent basis.  Continue physical therapy. Do not exceed 2400 mg of Advil daily.  Please schedule a follow up appointment in 1 month for blood pressure check and follow up on back pain.   It was a pleasure to see you today!

## 2017-09-26 NOTE — Assessment & Plan Note (Signed)
Recent A1C stable, continue current regimen. Eye, foot exam UTD. Discussed Prevnar for next visit. Managed on ARB, no statin.  Referral placed to podiatry per patient request.  Follow up in 6 months.

## 2017-09-26 NOTE — Assessment & Plan Note (Signed)
Overall doing better with PT. Continue Advil daily PRN. Continue vitamin D weekly. Consider ortho eval if no continued improvement.

## 2017-09-26 NOTE — Assessment & Plan Note (Signed)
Doing well off of lorazepam and sleeping well. She has no concerns.

## 2017-09-26 NOTE — Assessment & Plan Note (Signed)
Continue vitamin D 50,000 once weekly.

## 2017-09-26 NOTE — Assessment & Plan Note (Signed)
Improved on Losartan, not quite at goal.  Increase dose to 100 mg once daily. She will continue to monitor BP and report readings at or above 140/90. Follow up in 1 month. BMP unremarkable.

## 2017-09-27 DIAGNOSIS — M545 Low back pain: Secondary | ICD-10-CM | POA: Diagnosis not present

## 2017-09-29 DIAGNOSIS — M545 Low back pain: Secondary | ICD-10-CM | POA: Diagnosis not present

## 2017-10-04 DIAGNOSIS — M545 Low back pain: Secondary | ICD-10-CM | POA: Diagnosis not present

## 2017-10-06 DIAGNOSIS — M545 Low back pain: Secondary | ICD-10-CM | POA: Diagnosis not present

## 2017-10-11 DIAGNOSIS — M545 Low back pain: Secondary | ICD-10-CM | POA: Diagnosis not present

## 2017-10-13 ENCOUNTER — Ambulatory Visit (INDEPENDENT_AMBULATORY_CARE_PROVIDER_SITE_OTHER): Payer: Medicare Other | Admitting: Podiatry

## 2017-10-13 ENCOUNTER — Encounter: Payer: Self-pay | Admitting: Podiatry

## 2017-10-13 VITALS — BP 180/81 | HR 86

## 2017-10-13 DIAGNOSIS — Q828 Other specified congenital malformations of skin: Secondary | ICD-10-CM | POA: Diagnosis not present

## 2017-10-13 DIAGNOSIS — E119 Type 2 diabetes mellitus without complications: Secondary | ICD-10-CM

## 2017-10-13 DIAGNOSIS — M545 Low back pain: Secondary | ICD-10-CM | POA: Diagnosis not present

## 2017-10-13 NOTE — Progress Notes (Signed)
   Subjective:    Patient ID: Morgan Bowen, female    DOB: May 26, 1936, 82 y.o.   MRN: [id number]  HPIthis patient fice stating that she desires to have her calluses trimmed on both feet.  She says she is moved here from [place] and desires to obtain a podiatrist for her foot care.  She says she has calluses on the bottom of both feet, which are painful walking and wearing her shoes.  She says she had a podiatrist in [place] who would see her every 6 months.  She presents the office today for evaluation and treatment of her painful calluses.    Review of Systems  All other systems reviewed and are negative.      Objective:   Physical Exam General Appearance  Alert, conversant and in no acute stress.  Vascular  Dorsalis pedis and posterior tibial  pulses are palpable  bilaterally.  Capillary return is within normal limits  bilaterally. Temperature is within normal limits  bilaterally.  Neurologic  Senn-Weinstein monofilament wire test within normal limits  bilaterally. Muscle power within normal limits bilaterally.  Nails Normal nails noted with no evidence of bacterial or fungal infection.  Orthopedic  No limitations of motion of motion feet .  No crepitus or effusions noted.  HAV  B/L.  Skin  normotropic skin with no porokeratosis noted bilaterally.  No signs of infections or ulcers noted. Porokeratosis sub 4th  B/L  Porokeratosis sub 5th right foot.            Assessment & Plan:  Porokeratosis  B/L  Diabetes with no foot complications.   IE  Debride porokeratosis   RTC  6 months     Gardiner Barefoot DPM

## 2017-10-18 DIAGNOSIS — L821 Other seborrheic keratosis: Secondary | ICD-10-CM | POA: Diagnosis not present

## 2017-10-18 DIAGNOSIS — L570 Actinic keratosis: Secondary | ICD-10-CM | POA: Diagnosis not present

## 2017-10-18 DIAGNOSIS — M545 Low back pain: Secondary | ICD-10-CM | POA: Diagnosis not present

## 2017-10-18 DIAGNOSIS — L820 Inflamed seborrheic keratosis: Secondary | ICD-10-CM | POA: Diagnosis not present

## 2017-10-20 DIAGNOSIS — M545 Low back pain: Secondary | ICD-10-CM | POA: Diagnosis not present

## 2017-10-24 ENCOUNTER — Ambulatory Visit (INDEPENDENT_AMBULATORY_CARE_PROVIDER_SITE_OTHER): Payer: Medicare Other | Admitting: Primary Care

## 2017-10-24 ENCOUNTER — Encounter: Payer: Self-pay | Admitting: Primary Care

## 2017-10-24 VITALS — BP 140/70 | HR 82 | Temp 97.8°F | Ht 65.75 in | Wt 124.5 lb

## 2017-10-24 DIAGNOSIS — M545 Low back pain: Secondary | ICD-10-CM

## 2017-10-24 DIAGNOSIS — M4856XD Collapsed vertebra, not elsewhere classified, lumbar region, subsequent encounter for fracture with routine healing: Secondary | ICD-10-CM | POA: Diagnosis not present

## 2017-10-24 DIAGNOSIS — Z23 Encounter for immunization: Secondary | ICD-10-CM

## 2017-10-24 DIAGNOSIS — I10 Essential (primary) hypertension: Secondary | ICD-10-CM | POA: Diagnosis not present

## 2017-10-24 DIAGNOSIS — E119 Type 2 diabetes mellitus without complications: Secondary | ICD-10-CM

## 2017-10-24 DIAGNOSIS — G8929 Other chronic pain: Secondary | ICD-10-CM

## 2017-10-24 LAB — LIPID PANEL
CHOLESTEROL: 203 mg/dL — AB (ref 0–200)
HDL: 56.8 mg/dL (ref >39.00)
LDL CALC: 110 mg/dL — AB (ref 0–99)
NonHDL: 146.5
TRIGLYCERIDES: 184 mg/dL — AB (ref 0.0–149.0)
Total CHOL/HDL Ratio: 4
VLDL: 36.8 mg/dL (ref 0.0–40.0)

## 2017-10-24 NOTE — Progress Notes (Signed)
Subjective:    Patient ID: Morgan Bowen, female    DOB: 12/06/1935, 82 y.o.   MRN: [id number]  HPI  [name] is a 82 year old female who presents today for follow up.   1) Essential Hypertension: Currently managed on losartan 100 mg and diltiazem 300 mg. She's monitoring her BP at home and gets various readings of 160-170's/60's. She denies chest pain, dizziness, shortness of breath.   BP Readings from Last 3 Encounters:  10/24/17 140/70  10/13/17 (!) 180/81  09/26/17 140/66   2) Compression Fracture of Lumbar Spine: Diagnosed in December 2018. Currently active in physical therapy and is progressing overall but continues to experience pain to her right lower back. She denies numbness/tingling, loss of bowel or bladder control. She will experience a "catch" to her right back which will occur sporadically throughout the day. She is compliant to her vitamin D 50,000 unit capsules once weekly. She has declined other treatment for osteoporosis in the past.   Review of Systems  Respiratory: Negative for shortness of breath.   Cardiovascular: Negative for chest pain.  Musculoskeletal: Positive for back pain.  Skin: Negative for color change.  Neurological: Negative for weakness and numbness.       Past Medical History:  Diagnosis Date  . Anxiety   . Arthritis   . Chickenpox   . Diabetes mellitus without complication ([place])   . GERD (gastroesophageal reflux disease)   . Hypertension   . Osteoporosis   . Type 2 diabetes mellitus ([place])   . Urinary tract infection      Social History   Socioeconomic History  . Marital status: Widowed    Spouse name: Not on file  . Number of children: Not on file  . Years of education: Not on file  . Highest education level: Not on file  Social Needs  . Financial resource strain: Not on file  . Food insecurity - worry: Not on file  . Food insecurity - inability: Not on file  . Transportation needs - medical: Not on file  . Transportation  needs - non-medical: Not on file  Occupational History  . Not on file  Tobacco Use  . Smoking status: Never Smoker  . Smokeless tobacco: Never Used  Substance and Sexual Activity  . Alcohol use: No    Frequency: Never  . Drug use: No  . Sexual activity: Not on file  Other Topics Concern  . Not on file  Social History Narrative   ** Merged History Encounter **       Widowed. Moved from [place]. 1 son, 3 grandchildren. Retired. Once worked as a Agricultural engineer.     Past Surgical History:  Procedure Laterality Date  . ABDOMINAL HYSTERECTOMY  1984  . ABDOMINAL HYSTERECTOMY    . APPENDECTOMY  1958  . CHOLECYSTECTOMY    . GALLBLADDER SURGERY    . MANDIBLE FRACTURE SURGERY  07/13/2016  . TONSILLECTOMY  1958  . TONSILLECTOMY    . WRIST FRACTURE SURGERY Right    2000    Family History  Problem Relation Age of Onset  . Hypertension Mother   . Stroke Mother   . Stroke Father   . Hypertension Father   . Hypertension Sister   . Kidney disease Sister     Allergies  Allergen Reactions  . Sulfamethoxazole-Trimethoprim   . Penicillins Rash    Current Outpatient Medications on File Prior to Visit  Medication Sig Dispense Refill  . diltiazem (TIAZAC) 300 MG  24 hr capsule Take 1 capsule (300 mg total) by mouth daily. 90 capsule 3  . glimepiride (AMARYL) 2 MG tablet Take 2 mg by mouth daily with breakfast.    . losartan (COZAAR) 100 MG tablet Take 1 tablet (100 mg total) by mouth daily. 90 tablet 1  . metFORMIN (GLUCOPHAGE) 500 MG tablet Take 500 mg by mouth 2 (two) times daily with a meal.    . ONE TOUCH ULTRA TEST test strip USE AS DIRECTED TEST 3 TIMES DAILY E11.9 100 each 2  . Vitamin D, Ergocalciferol, (DRISDOL) 50000 units CAPS capsule Take 1 capsule (50,000 Units total) by mouth once a week. 12 capsule 3   No current facility-administered medications on file prior to visit.     BP 140/70   Pulse 82   Temp 97.8 F (36.6 C) (Oral)   Ht 5' 5.75" (1.67 m)   Wt 124  lb 8 oz (56.5 kg)   SpO2 98%   BMI 20.25 kg/m    Objective:   Physical Exam  Constitutional: She appears well-nourished.  Neck: Neck supple.  Cardiovascular: Normal rate and regular rhythm.  Pulmonary/Chest: Effort normal and breath sounds normal.  Musculoskeletal:  Negative straight leg raise bilaterally. 5/5 strength to bilateral lower extremities   Neurological:  Reflex Scores:      Patellar reflexes are 2+ on the right side and 2+ on the left side. Skin: Skin is warm and dry.  Psychiatric: She has a normal mood and affect.          Assessment & Plan:

## 2017-10-24 NOTE — Assessment & Plan Note (Signed)
Stable in the office today, continue current regimen. 

## 2017-10-24 NOTE — Assessment & Plan Note (Signed)
Strength and ROM improving with PT, still experiencing right lower back pain. Likely from separate issue, but given history of compression fracture to lumbar spine it could be related.   Referral placed to orthopedics for further evaluation. Exam today unremarkable.

## 2017-10-24 NOTE — Addendum Note (Signed)
Addended by: Jacqualin Combes on: 10/24/2017 12:58 PM   Modules accepted: Orders

## 2017-10-24 NOTE — Patient Instructions (Signed)
You will be contacted regarding your referral to orthopedics.  Please let us know if you have not been contacted within one week.   Stop by the lab prior to leaving today. I will notify you of your results once received.   Continue with physical therapy as discussed.  Schedule a follow up visit in August 2019.  It was a pleasure to see you today!

## 2017-10-25 DIAGNOSIS — M545 Low back pain: Secondary | ICD-10-CM | POA: Diagnosis not present

## 2017-10-26 ENCOUNTER — Telehealth: Payer: Self-pay

## 2017-10-26 NOTE — Telephone Encounter (Signed)
Left message for patient to call me back in regards to a referral-Mackinley Cassaday V Aurel Nguyen, RMA

## 2017-11-01 DIAGNOSIS — M545 Low back pain: Secondary | ICD-10-CM | POA: Diagnosis not present

## 2017-11-02 DIAGNOSIS — M545 Low back pain: Secondary | ICD-10-CM | POA: Diagnosis not present

## 2017-11-07 DIAGNOSIS — M5416 Radiculopathy, lumbar region: Secondary | ICD-10-CM | POA: Diagnosis not present

## 2017-11-09 DIAGNOSIS — M545 Low back pain: Secondary | ICD-10-CM | POA: Diagnosis not present

## 2017-11-17 DIAGNOSIS — M545 Low back pain: Secondary | ICD-10-CM | POA: Diagnosis not present

## 2017-11-20 ENCOUNTER — Other Ambulatory Visit: Payer: Self-pay

## 2017-11-20 ENCOUNTER — Encounter: Payer: Self-pay | Admitting: Emergency Medicine

## 2017-11-20 ENCOUNTER — Emergency Department
Admission: EM | Admit: 2017-11-20 | Discharge: 2017-11-20 | Disposition: A | Discharge status: Home / Self Care | Payer: Medicare Other | Attending: Emergency Medicine | Admitting: Emergency Medicine

## 2017-11-20 DIAGNOSIS — I10 Essential (primary) hypertension: Secondary | ICD-10-CM | POA: Insufficient documentation

## 2017-11-20 DIAGNOSIS — E119 Type 2 diabetes mellitus without complications: Secondary | ICD-10-CM | POA: Diagnosis not present

## 2017-11-20 DIAGNOSIS — Z7984 Long term (current) use of oral hypoglycemic drugs: Secondary | ICD-10-CM | POA: Diagnosis not present

## 2017-11-20 DIAGNOSIS — Z79899 Other long term (current) drug therapy: Secondary | ICD-10-CM | POA: Diagnosis not present

## 2017-11-20 DIAGNOSIS — N3001 Acute cystitis with hematuria: Secondary | ICD-10-CM | POA: Diagnosis not present

## 2017-11-20 DIAGNOSIS — R300 Dysuria: Secondary | ICD-10-CM

## 2017-11-20 LAB — URINALYSIS, COMPLETE (UACMP) WITH MICROSCOPIC
Bacteria, UA: NONE SEEN
Bilirubin Urine: NEGATIVE
Glucose, UA: 50 mg/dL — AB
KETONES UR: 5 mg/dL — AB
Nitrite: NEGATIVE
PH: 6 (ref 5.0–8.0)
PROTEIN: 100 mg/dL — AB
SQUAMOUS EPITHELIAL / LPF: NONE SEEN
Specific Gravity, Urine: 1.014 (ref 1.005–1.030)

## 2017-11-20 MED ORDER — CEPHALEXIN 500 MG PO CAPS: 500.0000 mg | ORAL_CAPSULE | Freq: Four times a day (QID) | ORAL | 0 refills | Status: AC

## 2017-11-20 NOTE — Discharge Instructions (Addendum)
Please make sure you are drinking lots of fluids.  Take antibiotic as prescribed.  Return to the emergency department for any fevers, increasing back pain, nausea, vomiting, worsening symptoms or urgent changes in your health.

## 2017-11-20 NOTE — ED Notes (Signed)

## 2017-11-20 NOTE — ED Triage Notes (Signed)
Pt ambulatory, appears well. Only symptom is dysuria.  Denies abdominal pain or any pain except when urinating.  Reports gets a UTI about once a year.  Tried to go to walk in but not open yet. VSS

## 2017-11-20 NOTE — ED Provider Notes (Addendum)
[facility] EMERGENCY DEPARTMENT Provider Note   CSN: [id number] Arrival date & time: 11/20/17  0848     History   Chief Complaint Chief Complaint  Patient presents with  . Dysuria    HPI Morgan Bowen is a 82 y.o. female.  Presents to the emergency department for evaluation of dysuria.  Patient's had burning with urination that began this morning.  She has also noted increased urinary frequency.  Patient noted this morning when she wiped after urination she noticed a spot of blood.  She denies any abdominal pain, back pain, nausea, vomiting, fevers.  No recent urinary tract infections.  She denies any vaginal discharge.  Currently no discomfort while sitting.  HPI  Past Medical History:  Diagnosis Date  . Anxiety   . Arthritis   . Chickenpox   . Diabetes mellitus without complication ([place])   . GERD (gastroesophageal reflux disease)   . Hypertension   . Osteoporosis   . Type 2 diabetes mellitus ([place])   . Urinary tract infection     Patient Active Problem List   Diagnosis Date Noted  . Non-traumatic compression fracture of lumbar vertebra with routine healing 08/26/2017  . Anxiety and depression 08/26/2017  . Type 2 diabetes mellitus ([place]) 04/27/2017  . Essential hypertension 04/27/2017  . Osteoporosis 04/27/2017    Past Surgical History:  Procedure Laterality Date  . ABDOMINAL HYSTERECTOMY  1984  . ABDOMINAL HYSTERECTOMY    . APPENDECTOMY  1958  . CHOLECYSTECTOMY    . GALLBLADDER SURGERY    . MANDIBLE FRACTURE SURGERY  07/13/2016  . TONSILLECTOMY  1958  . TONSILLECTOMY    . WRIST FRACTURE SURGERY Right    2000     OB History   None      Home Medications    Prior to Admission medications   Medication Sig Start Date End Date Taking? Authorizing Provider  cephALEXin (KEFLEX) 500 MG capsule Take 1 capsule (500 mg total) by mouth every 6 (six) hours for 7 days. 11/20/17 11/27/17  Duanne Guess, PA-C  diltiazem (TIAZAC) 300 MG 24 hr  capsule Take 1 capsule (300 mg total) by mouth daily. 09/26/17   [name], NP  glimepiride (AMARYL) 2 MG tablet Take 2 mg by mouth daily with breakfast.    [provider]  losartan (COZAAR) 100 MG tablet Take 1 tablet (100 mg total) by mouth daily. 09/26/17   [name], NP  metFORMIN (GLUCOPHAGE) 500 MG tablet Take 500 mg by mouth 2 (two) times daily with a meal.    [provider]  ONE TOUCH ULTRA TEST test strip USE AS DIRECTED TEST 3 TIMES DAILY E11.9 08/23/17   [name], NP  Vitamin D, Ergocalciferol, (DRISDOL) 50000 units CAPS capsule Take 1 capsule (50,000 Units total) by mouth once a week. 09/26/17   [name], NP    Family History Family History  Problem Relation Age of Onset  . Hypertension Mother   . Stroke Mother   . Stroke Father   . Hypertension Father   . Hypertension Sister   . Kidney disease Sister     Social History Social History   Tobacco Use  . Smoking status: Never Smoker  . Smokeless tobacco: Never Used  Substance Use Topics  . Alcohol use: No    Frequency: Never  . Drug use: No     Allergies   Sulfamethoxazole-trimethoprim and Penicillins   Review of Systems Review of Systems  Constitutional:  Negative for fever.  Respiratory: Negative for shortness of breath.   Cardiovascular: Negative for chest pain.  Gastrointestinal: Negative for abdominal pain.  Genitourinary: Positive for dysuria, frequency and urgency. Negative for difficulty urinating, hematuria, vaginal bleeding and vaginal discharge.  Musculoskeletal: Negative for back pain and myalgias.  Skin: Negative for rash.  Neurological: Negative for dizziness and headaches.     Physical Exam Updated Vital Signs BP (!) 181/67   Pulse 100   Temp 98 F (36.7 C) (Oral)   Resp 16   Ht 5\' 5"  (1.651 m)   Wt 56.7 kg (125 lb)   SpO2 98%   BMI 20.80 kg/m   Physical Exam  Constitutional: She is oriented to person, place, and time. She  appears well-developed and well-nourished.  HENT:  Head: Normocephalic and atraumatic.  Eyes: Conjunctivae are normal.  Neck: Normal range of motion.  Cardiovascular: Normal rate.  Pulmonary/Chest: Effort normal. No respiratory distress.  Abdominal: Soft. She exhibits no distension. There is no tenderness. There is no guarding.  No CVA tenderness bilaterally.  Musculoskeletal: Normal range of motion.  Neurological: She is alert and oriented to person, place, and time.  Skin: Skin is warm. No rash noted.  Psychiatric: She has a normal mood and affect. Her behavior is normal. Thought content normal.     ED Treatments / Results  Labs (all labs ordered are listed, but only abnormal results are displayed) Labs Reviewed  URINALYSIS, COMPLETE (UACMP) WITH MICROSCOPIC - Abnormal; Notable for the following components:      Result Value   Color, Urine YELLOW (*)    APPearance CLOUDY (*)    Glucose, UA 50 (*)    Hgb urine dipstick LARGE (*)    Ketones, ur 5 (*)    Protein, ur 100 (*)    Leukocytes, UA LARGE (*)    All other components within normal limits    EKG None  Radiology No results found.  Procedures Procedures (including critical care time)  Medications Ordered in ED Medications - No data to display   Initial Impression / Assessment and Plan / ED Course  I have reviewed the triage vital signs and the nursing notes.  Pertinent labs & imaging results that were available during my care of the patient were reviewed by me and considered in my medical decision making (see chart for details).     82 year old female with acute urinary tract infection with hematuria.  Urinalysis reviewed by me indicates UTI.  Patient is started on cephalexin.  She will increase fluids.  She is educated on signs and symptoms to return to the ED for.  Final Clinical Impressions(s) / ED Diagnoses   Final diagnoses:  Dysuria  Acute cystitis with hematuria    ED Discharge Orders         Ordered    cephALEXin (KEFLEX) 500 MG capsule  Every 6 hours     11/20/17 0934       Duanne Guess, PA-C 11/20/17 0937    Duanne Guess, PA-C 11/20/17 9629    Gregor Hams, MD 11/20/17 1013

## 2017-11-22 DIAGNOSIS — M545 Low back pain: Secondary | ICD-10-CM | POA: Diagnosis not present

## 2017-11-24 DIAGNOSIS — M545 Low back pain: Secondary | ICD-10-CM | POA: Diagnosis not present

## 2017-11-29 DIAGNOSIS — M545 Low back pain: Secondary | ICD-10-CM | POA: Diagnosis not present

## 2017-11-30 ENCOUNTER — Telehealth: Payer: Self-pay | Admitting: Primary Care

## 2017-11-30 NOTE — Telephone Encounter (Unsigned)
Copied from [place]. Topic: Quick Communication - See Telephone Encounter >> Nov 30, 2017  5:21 PM Neva Seat wrote: Pt is needing to know why her insurance rate quoted would be high due pt's medical history that Allie Bossier gave 3 reasons. Please call pt's daughter back to discuss asap.  Laurita Quint - [phone] - daughter

## 2017-12-06 DIAGNOSIS — M545 Low back pain: Secondary | ICD-10-CM | POA: Diagnosis not present

## 2017-12-07 DIAGNOSIS — M545 Low back pain: Secondary | ICD-10-CM | POA: Diagnosis not present

## 2017-12-08 DIAGNOSIS — M545 Low back pain: Secondary | ICD-10-CM | POA: Diagnosis not present

## 2017-12-13 DIAGNOSIS — M545 Low back pain: Secondary | ICD-10-CM | POA: Diagnosis not present

## 2017-12-15 DIAGNOSIS — M545 Low back pain: Secondary | ICD-10-CM | POA: Diagnosis not present

## 2017-12-20 DIAGNOSIS — M545 Low back pain: Secondary | ICD-10-CM | POA: Diagnosis not present

## 2017-12-22 DIAGNOSIS — M545 Low back pain: Secondary | ICD-10-CM | POA: Diagnosis not present

## 2017-12-27 DIAGNOSIS — M545 Low back pain: Secondary | ICD-10-CM | POA: Diagnosis not present

## 2017-12-29 DIAGNOSIS — M545 Low back pain: Secondary | ICD-10-CM | POA: Diagnosis not present

## 2018-01-03 DIAGNOSIS — M545 Low back pain: Secondary | ICD-10-CM | POA: Diagnosis not present

## 2018-01-05 DIAGNOSIS — M545 Low back pain: Secondary | ICD-10-CM | POA: Diagnosis not present

## 2018-01-10 DIAGNOSIS — M545 Low back pain: Secondary | ICD-10-CM | POA: Diagnosis not present

## 2018-01-12 DIAGNOSIS — M545 Low back pain: Secondary | ICD-10-CM | POA: Diagnosis not present

## 2018-01-16 ENCOUNTER — Ambulatory Visit (INDEPENDENT_AMBULATORY_CARE_PROVIDER_SITE_OTHER): Payer: Medicare Other | Admitting: Podiatry

## 2018-01-16 ENCOUNTER — Encounter: Payer: Self-pay | Admitting: Podiatry

## 2018-01-16 DIAGNOSIS — B351 Tinea unguium: Secondary | ICD-10-CM | POA: Diagnosis not present

## 2018-01-16 DIAGNOSIS — E119 Type 2 diabetes mellitus without complications: Secondary | ICD-10-CM

## 2018-01-16 DIAGNOSIS — M79674 Pain in right toe(s): Secondary | ICD-10-CM | POA: Diagnosis not present

## 2018-01-16 DIAGNOSIS — M79675 Pain in left toe(s): Secondary | ICD-10-CM

## 2018-01-16 NOTE — Progress Notes (Signed)
Complaint:  Visit Type: Patient returns to my office for continued preventative foot care services. Complaint: Patient states" my nails have grown long and thick and become painful to walk and wear shoes" Patient has been diagnosed with DM with no foot complications. The patient presents for preventative foot care services. No changes to ROS  Podiatric Exam: Vascular: dorsalis pedis and posterior tibial pulses are palpable bilateral. Capillary return is immediate. Temperature gradient is WNL. Skin turgor WNL  Sensorium: Normal Semmes Weinstein monofilament test. Normal tactile sensation bilaterally. Nail Exam: Pt has thick disfigured discolored nails with subungual debris noted bilateral entire nail hallux  Ulcer Exam: There is no evidence of ulcer or pre-ulcerative changes or infection. Orthopedic Exam: Muscle tone and strength are WNL. No limitations in general ROM. No crepitus or effusions noted. Foot type and digits show no abnormalities. Bony prominences are unremarkable. Skin: No Porokeratosis. No infection or ulcers  Diagnosis:  Onychomycosis, , Pain in right toe, pain in left toes  Treatment & Plan Procedures and Treatment: Consent by patient was obtained for treatment procedures.   Debridement of mycotic and hypertrophic toenails, 1 through 5 bilateral and clearing of subungual debris. No ulceration, no infection noted.  Return Visit-Office Procedure: Patient instructed to return to the office for a follow up visit 3 months for continued evaluation and treatment.    Rily Nickey DPM 

## 2018-01-17 DIAGNOSIS — M545 Low back pain: Secondary | ICD-10-CM | POA: Diagnosis not present

## 2018-01-19 DIAGNOSIS — M545 Low back pain: Secondary | ICD-10-CM | POA: Diagnosis not present

## 2018-01-25 DIAGNOSIS — M545 Low back pain: Secondary | ICD-10-CM | POA: Diagnosis not present

## 2018-01-27 DIAGNOSIS — M545 Low back pain: Secondary | ICD-10-CM | POA: Diagnosis not present

## 2018-02-08 DIAGNOSIS — M4316 Spondylolisthesis, lumbar region: Secondary | ICD-10-CM | POA: Diagnosis not present

## 2018-03-01 DIAGNOSIS — L820 Inflamed seborrheic keratosis: Secondary | ICD-10-CM | POA: Diagnosis not present

## 2018-03-01 DIAGNOSIS — L570 Actinic keratosis: Secondary | ICD-10-CM | POA: Diagnosis not present

## 2018-03-11 ENCOUNTER — Other Ambulatory Visit: Payer: Self-pay | Admitting: Primary Care

## 2018-03-11 DIAGNOSIS — I10 Essential (primary) hypertension: Secondary | ICD-10-CM

## 2018-03-11 DIAGNOSIS — E785 Hyperlipidemia, unspecified: Secondary | ICD-10-CM

## 2018-03-11 DIAGNOSIS — E119 Type 2 diabetes mellitus without complications: Secondary | ICD-10-CM

## 2018-03-16 ENCOUNTER — Ambulatory Visit (INDEPENDENT_AMBULATORY_CARE_PROVIDER_SITE_OTHER): Payer: Medicare Other

## 2018-03-16 VITALS — BP 150/60 | HR 88 | Temp 98.0°F | Ht 65.0 in | Wt 124.2 lb

## 2018-03-16 DIAGNOSIS — Z0000 Encounter for general adult medical examination without abnormal findings: Secondary | ICD-10-CM | POA: Diagnosis not present

## 2018-03-16 DIAGNOSIS — E119 Type 2 diabetes mellitus without complications: Secondary | ICD-10-CM

## 2018-03-16 DIAGNOSIS — E785 Hyperlipidemia, unspecified: Secondary | ICD-10-CM

## 2018-03-16 DIAGNOSIS — I10 Essential (primary) hypertension: Secondary | ICD-10-CM | POA: Diagnosis not present

## 2018-03-16 LAB — LIPID PANEL
CHOLESTEROL: 169 mg/dL (ref 0–200)
HDL: 47.2 mg/dL (ref >39.00)
LDL CALC: 90 mg/dL (ref 0–99)
NonHDL: 121.61
TRIGLYCERIDES: 158 mg/dL — AB (ref 0.0–149.0)
Total CHOL/HDL Ratio: 4
VLDL: 31.6 mg/dL (ref 0.0–40.0)

## 2018-03-16 LAB — COMPREHENSIVE METABOLIC PANEL
ALBUMIN: 4 g/dL (ref 3.5–5.2)
ALT: 13 U/L (ref 0–35)
AST: 13 U/L (ref 0–37)
Alkaline Phosphatase: 73 U/L (ref 39–117)
BUN: 17 mg/dL (ref 6–23)
CALCIUM: 9.9 mg/dL (ref 8.4–10.5)
CHLORIDE: 104 meq/L (ref 96–112)
CO2: 26 mEq/L (ref 19–32)
CREATININE: 1.11 mg/dL (ref 0.40–1.20)
GFR: 50.26 mL/min — AB (ref >60.00)
Glucose, Bld: 139 mg/dL — ABNORMAL HIGH (ref 70–99)
Potassium: 4.7 mEq/L (ref 3.5–5.1)
Sodium: 139 mEq/L (ref 135–145)
Total Bilirubin: 0.5 mg/dL (ref 0.2–1.2)
Total Protein: 7.6 g/dL (ref 6.0–8.3)

## 2018-03-16 LAB — HEMOGLOBIN A1C: Hgb A1c MFr Bld: 7.1 % — ABNORMAL HIGH (ref 4.6–6.5)

## 2018-03-16 NOTE — Patient Instructions (Signed)
Morgan Bowen , Thank you for taking time to come for your Medicare Wellness Visit. I appreciate your ongoing commitment to your health goals. Please review the following plan we discussed and let me know if I can assist you in the future.   These are the goals we discussed: Goals    . Patient Stated     Starting 03/16/2018, I will continue to take medications as prescribed.        This is a list of the screening recommended for you and due dates:  Health Maintenance  Topic Date Due  . Flu Shot  03/16/2049*  . Eye exam for diabetics  07/09/2018  . Hemoglobin A1C  09/16/2018  . Complete foot exam   01/17/2019  . Tetanus Vaccine  07/13/2025  . DEXA scan (bone density measurement)  Completed  . Pneumonia vaccines  Completed  *Topic was postponed. The date shown is not the original due date.   Preventive Care for Adults  A healthy lifestyle and preventive care can promote health and wellness. Preventive health guidelines for adults include the following key practices.  . A routine yearly physical is a good way to check with your health care provider about your health and preventive screening. It is a chance to share any concerns and updates on your health and to receive a thorough exam.  . Visit your dentist for a routine exam and preventive care every 6 months. Brush your teeth twice a day and floss once a day. Good oral hygiene prevents tooth decay and gum disease.  . The frequency of eye exams is based on your age, health, family medical history, use  of contact lenses, and other factors. Follow your health care provider's recommendations for frequency of eye exams.  . Eat a healthy diet. Foods like vegetables, fruits, whole grains, low-fat dairy products, and lean protein foods contain the nutrients you need without too many calories. Decrease your intake of foods high in solid fats, added sugars, and salt. Eat the right amount of calories for you. Get information about a proper diet  from your health care provider, if necessary.  . Regular physical exercise is one of the most important things you can do for your health. Most adults should get at least 150 minutes of moderate-intensity exercise (any activity that increases your heart rate and causes you to sweat) each week. In addition, most adults need muscle-strengthening exercises on 2 or more days a week.  Silver Sneakers may be a benefit available to you. To determine eligibility, you may visit the website: www.silversneakers.com or contact program at 502-386-8432 Mon-Fri between 8AM-8PM.   . Maintain a healthy weight. The body mass index (BMI) is a screening tool to identify possible weight problems. It provides an estimate of body fat based on height and weight. Your health care provider can find your BMI and can help you achieve or maintain a healthy weight.   For adults 20 years and older: ? A BMI below 18.5 is considered underweight. ? A BMI of 18.5 to 24.9 is normal. ? A BMI of 25 to 29.9 is considered overweight. ? A BMI of 30 and above is considered obese.   . Maintain normal blood lipids and cholesterol levels by exercising and minimizing your intake of saturated fat. Eat a balanced diet with plenty of fruit and vegetables. Blood tests for lipids and cholesterol should begin at age 62 and be repeated every 5 years. If your lipid or cholesterol levels are high, you are  over 66, or you are at high risk for heart disease, you may need your cholesterol levels checked more frequently. Ongoing high lipid and cholesterol levels should be treated with medicines if diet and exercise are not working.  . If you smoke, find out from your health care provider how to quit. If you do not use tobacco, please do not start.  . If you choose to drink alcohol, please do not consume more than 2 drinks per day. One drink is considered to be 12 ounces (355 mL) of beer, 5 ounces (148 mL) of wine, or 1.5 ounces (44 mL) of liquor.  .  If you are 22-38 years old, ask your health care provider if you should take aspirin to prevent strokes.  . Use sunscreen. Apply sunscreen liberally and repeatedly throughout the day. You should seek shade when your shadow is shorter than you. Protect yourself by wearing long sleeves, pants, a wide-brimmed hat, and sunglasses year round, whenever you are outdoors.  . Once a month, do a whole body skin exam, using a mirror to look at the skin on your back. Tell your health care provider of new moles, moles that have irregular borders, moles that are larger than a pencil eraser, or moles that have changed in shape or color.

## 2018-03-16 NOTE — Progress Notes (Signed)
PCP notes:   Health maintenance:  Flu vaccine - addressed A1C - completed  Abnormal screenings:   None  Patient concerns:   None   Nurse concerns:  None  Next PCP appt:   03/21/18 @ 0815

## 2018-03-16 NOTE — Progress Notes (Signed)
I reviewed health advisor's note, was available for consultation, and agree with documentation and plan.  

## 2018-03-16 NOTE — Progress Notes (Signed)
Subjective:   Morgan Bowen is a 82 y.o. female who presents for an Initial Medicare Annual Wellness Visit.  Review of Systems    N/A  Cardiac Risk Factors include: advanced age (>14men, >66 women);diabetes mellitus;dyslipidemia;hypertension     Objective:    Today's Vitals   03/16/18 0814 03/16/18 1616  BP: (!) 150/60   Pulse: 88   Temp: 98 F (36.7 C)   TempSrc: Oral   SpO2: 97%   Weight: 124 lb 4 oz (56.4 kg)   Height: 5\' 5"  (1.651 m)   PainSc: 2  2   PainLoc: Back    Body mass index is 20.68 kg/m.  Advanced Directives 03/16/2018  Does Patient Have a Medical Advance Directive? Yes  Type of Paramedic of [place];Living will  Copy of [place] in Chart? No - copy requested    Current Medications (verified) Outpatient Encounter Medications as of 03/16/2018  Medication Sig  . diltiazem (TIAZAC) 300 MG 24 hr capsule Take 1 capsule (300 mg total) by mouth daily.  Marland Kitchen glimepiride (AMARYL) 2 MG tablet Take 2 mg by mouth daily with breakfast.  . losartan (COZAAR) 100 MG tablet Take 1 tablet (100 mg total) by mouth daily.  . metFORMIN (GLUCOPHAGE) 500 MG tablet Take 500 mg by mouth 2 (two) times daily with a meal.  . ONE TOUCH ULTRA TEST test strip USE AS DIRECTED TEST 3 TIMES DAILY E11.9  . Vitamin D, Ergocalciferol, (DRISDOL) 50000 units CAPS capsule Take 1 capsule (50,000 Units total) by mouth once a week.   No facility-administered encounter medications on file as of 03/16/2018.     Allergies (verified) Sulfamethoxazole-trimethoprim and Penicillins   History: Past Medical History:  Diagnosis Date  . Anxiety   . Arthritis   . Chickenpox   . Diabetes mellitus without complication ([place])   . GERD (gastroesophageal reflux disease)   . Hypertension   . Osteoporosis   . Type 2 diabetes mellitus ([place])   . Urinary tract infection    Past Surgical History:  Procedure Laterality Date  . ABDOMINAL HYSTERECTOMY  1984  . ABDOMINAL  HYSTERECTOMY    . APPENDECTOMY  1958  . CHOLECYSTECTOMY    . GALLBLADDER SURGERY    . MANDIBLE FRACTURE SURGERY  07/13/2016  . TONSILLECTOMY  1958  . TONSILLECTOMY    . WRIST FRACTURE SURGERY Right    2000   Family History  Problem Relation Age of Onset  . Hypertension Mother   . Stroke Mother   . Stroke Father   . Hypertension Father   . Hypertension Sister   . Kidney disease Sister    Social History   Socioeconomic History  . Marital status: Widowed    Spouse name: Not on file  . Number of children: Not on file  . Years of education: Not on file  . Highest education level: Not on file  Occupational History  . Not on file  Social Needs  . Financial resource strain: Not on file  . Food insecurity:    Worry: Not on file    Inability: Not on file  . Transportation needs:    Medical: Not on file    Non-medical: Not on file  Tobacco Use  . Smoking status: Never Smoker  . Smokeless tobacco: Never Used  Substance and Sexual Activity  . Alcohol use: No    Frequency: Never  . Drug use: No  . Sexual activity: Not Currently  Lifestyle  . Physical  activity:    Days per week: Not on file    Minutes per session: Not on file  . Stress: Not on file  Relationships  . Social connections:    Talks on phone: Not on file    Gets together: Not on file    Attends religious service: Not on file    Active member of club or organization: Not on file    Attends meetings of clubs or organizations: Not on file    Relationship status: Not on file  Other Topics Concern  . Not on file  Social History Narrative   ** Merged History Encounter **       Widowed. Moved from [place]. 1 son, 3 grandchildren. Retired. Once worked as a Agricultural engineer.     Tobacco Counseling Counseling given: No   Clinical Intake:  Pre-visit preparation completed: Yes  Pain : 0-10 Pain Score: 2  Pain Location: Back Pain Orientation: Lower Pain Onset: More than a month ago Pain Frequency:  Constant     Nutritional Status: BMI of 19-24  Normal Nutritional Risks: None Diabetes: No  How often do you need to have someone help you when you read instructions, pamphlets, or other written materials from your doctor or pharmacy?: 1 - Never What is the last grade level you completed in school?: 12th grade  Interpreter Needed?: No  Comments: pt is a widow and lives alone Information entered by :: LPinson, LPN   Activities of Daily Living In your present state of health, do you have any difficulty performing the following activities: 03/16/2018  Hearing? N  Vision? N  Difficulty concentrating or making decisions? N  Walking or climbing stairs? N  Dressing or bathing? N  Doing errands, shopping? N  Preparing Food and eating ? N  Using the Toilet? N  In the past six months, have you accidently leaked urine? N  Do you have problems with loss of bowel control? N  Managing your Medications? N  Managing your Finances? N  Housekeeping or managing your Housekeeping? N  Some recent data might be hidden     Immunizations and Health Maintenance Immunization History  Administered Date(s) Administered  . Influenza, High Dose Seasonal PF 05/09/2012  . Pneumococcal Conjugate-13 10/24/2017  . Pneumococcal Polysaccharide-23 08/23/2013  . Tdap 07/14/2015  . Zoster 08/06/2013, 12/08/2014   There are no preventive care reminders to display for this patient.  Patient Care Team: [name], NP as PCP - General (Internal Medicine) [name], NP (Internal Medicine)  Indicate any recent Medical Services you may have received from other than [facility] providers in the past year (date may be approximate).     Assessment:   This is a routine wellness examination for [place].  Hearing/Vision screen  Hearing Screening   125Hz  250Hz  500Hz  1000Hz  2000Hz  3000Hz  4000Hz  6000Hz  8000Hz   Right ear:   40 40 40  40    Left ear:   40 40 40  40    Vision Screening Comments: 07/2017 with  Dr. Marvel Plan  Dietary issues and exercise activities discussed: Current Exercise Habits: Home exercise routine, Type of exercise: stretching;walking;strength training/weights;Other - see comments(stationary bike), Time (Minutes): 60, Frequency (Times/Week): 3, Weekly Exercise (Minutes/Week): 180, Intensity: Moderate, Exercise limited by: None identified  Goals    . Patient Stated     Starting 03/16/2018, I will continue to take medications as prescribed.       Depression Screen PHQ 2/9 Scores 03/16/2018 08/26/2017  PHQ - 2 Score 0  3  PHQ- 9 Score 0 6    Fall Risk Fall Risk  03/16/2018 10/24/2017  Falls in the past year? No Yes  Number falls in past yr: - 1  Injury with Fall? - Yes    Cognitive Function: MMSE - Mini Mental State Exam 03/16/2018  Orientation to time 5  Orientation to Place 5  Registration 3  Attention/ Calculation 0  Recall 3  Language- name 2 objects 0  Language- repeat 1  Language- follow 3 step command 3  Language- read & follow direction 0  Write a sentence 0  Copy design 0  Total score 20        Screening Tests Health Maintenance  Topic Date Due  . INFLUENZA VACCINE  03/16/2049 (Originally 03/09/2018)  . OPHTHALMOLOGY EXAM  07/09/2018  . HEMOGLOBIN A1C  09/16/2018  . FOOT EXAM  01/17/2019  . TETANUS/TDAP  07/13/2025  . DEXA SCAN  Completed  . PNA vac Low Risk Adult  Completed      Plan:    I have personally reviewed, addressed, and noted the following in the patient's chart:  A. Medical and social history B. Use of alcohol, tobacco or illicit drugs  C. Current medications and supplements D. Functional ability and status E.  Nutritional status F.  Physical activity G. Advance directives H. List of other physicians I.  Hospitalizations, surgeries, and ER visits in previous 12 months J.  [place] to include hearing, vision, cognitive, depression L. Referrals and appointments - none  In addition, I have reviewed and discussed  with patient certain preventive protocols, quality metrics, and best practice recommendations. A written personalized care plan for preventive services as well as general preventive health recommendations were provided to patient.  See attached scanned questionnaire for additional information.   Signed,   Lindell Noe, MHA, BS, LPN Health Coach

## 2018-03-21 ENCOUNTER — Ambulatory Visit (INDEPENDENT_AMBULATORY_CARE_PROVIDER_SITE_OTHER): Payer: Medicare Other | Admitting: Primary Care

## 2018-03-21 ENCOUNTER — Encounter: Payer: Self-pay | Admitting: Primary Care

## 2018-03-21 ENCOUNTER — Encounter: Payer: Medicare Other | Admitting: Primary Care

## 2018-03-21 VITALS — BP 142/66 | HR 78 | Temp 98.0°F | Ht 65.0 in | Wt 126.0 lb

## 2018-03-21 DIAGNOSIS — I10 Essential (primary) hypertension: Secondary | ICD-10-CM | POA: Diagnosis not present

## 2018-03-21 DIAGNOSIS — N289 Disorder of kidney and ureter, unspecified: Secondary | ICD-10-CM | POA: Insufficient documentation

## 2018-03-21 DIAGNOSIS — M4856XD Collapsed vertebra, not elsewhere classified, lumbar region, subsequent encounter for fracture with routine healing: Secondary | ICD-10-CM | POA: Diagnosis not present

## 2018-03-21 DIAGNOSIS — Z1239 Encounter for other screening for malignant neoplasm of breast: Secondary | ICD-10-CM

## 2018-03-21 DIAGNOSIS — E119 Type 2 diabetes mellitus without complications: Secondary | ICD-10-CM

## 2018-03-21 DIAGNOSIS — Z1231 Encounter for screening mammogram for malignant neoplasm of breast: Secondary | ICD-10-CM

## 2018-03-21 DIAGNOSIS — M810 Age-related osteoporosis without current pathological fracture: Secondary | ICD-10-CM | POA: Diagnosis not present

## 2018-03-21 NOTE — Assessment & Plan Note (Signed)
Repeat A1C of 7.1 on recent labs. Endorses recent increased consumption of candy over the last several months, has decreased consumption.   Foot and eye exam up to date.  Pneumonia vaccination up to date.  Continue current regimen.  Follow up in 6 months.

## 2018-03-21 NOTE — Assessment & Plan Note (Signed)
Decrease in GFR from 63 to 50 from February 2019.  Denies daily use of nephrotoxic agents. Is on losartan.  BP stable. Repeat BMP in 1-2 months.

## 2018-03-21 NOTE — Progress Notes (Signed)
Subjective:    Patient ID: Morgan Bowen, female    DOB: [birth year], 82 y.o.   MRN: [id number]  HPI  Morgan Bowen is an 82 year old female who presents today for [place] Part 2. She saw our health advisor last week.  1) Type 2 Diabetes:  Current medications include: Glimepiride 2 mg, Metformin 500 mg BID.  She is checking her blood glucose once daily and is getting readings of: AM Fasting: 150's Bedtime: <150. Doesn't remember  Last A1C: 7.1 recently, 6.5 in February 2019 Last Eye Exam: Due in December 2019 Last Foot Exam:  Completed in June 2019 Pneumonia Vaccination: Completed in 2019 ACE/ARB: Losartan Statin: None. LDL of 90.  Diet currently consists of:  Breakfast: Toast Lunch: Sandwich Dinner: Meat, vegetables  Snacks: Candy, peanut butter crackers Desserts: Candy daily recently Beverages: Water, coffee, soda (has cut back)  Exercise: She is exercising at the gym several times weekly. Completed physical therapy.   2) Essential Hypertension: Currently managed on losartan 100 mg and diltiazem ER 300 mg. She denies chest pain, dizziness, shortness of breath. She's checking her BP at home and is getting readings of 140's/60's.   BP Readings from Last 3 Encounters:  03/21/18 (!) 142/66  03/16/18 (!) 150/60  11/20/17 (!) 181/67    Immunizations: -Tetanus: Completed in 2016 -Influenza: Due this season -Pneumonia: Completed last in 2019 -Shingles: Completed in 2016  Colonoscopy: Completed in 2018 Dexa: Completed in December 2018. She is taking Calcium and Vitamin D OTC.  Mammogram: Due in October 2019    Review of Systems  Constitutional: Negative for unexpected weight change.  HENT: Negative for rhinorrhea.   Respiratory: Negative for cough and shortness of breath.   Cardiovascular: Negative for chest pain.  Gastrointestinal: Negative for constipation and diarrhea.  Genitourinary: Negative for difficulty urinating.  Musculoskeletal: Negative for arthralgias.   Balanced improved with physical therapy  Skin: Negative for rash.  Neurological: Negative for dizziness, numbness and headaches.       Past Medical History:  Diagnosis Date  . Anxiety   . Arthritis   . Chickenpox   . Diabetes mellitus without complication ([place])   . GERD (gastroesophageal reflux disease)   . Hypertension   . Osteoporosis   . Type 2 diabetes mellitus ([place])   . Urinary tract infection      Social History   Socioeconomic History  . Marital status: Widowed    Spouse name: Not on file  . Number of children: Not on file  . Years of education: Not on file  . Highest education level: Not on file  Occupational History  . Not on file  Social Needs  . Financial resource strain: Not on file  . Food insecurity:    Worry: Not on file    Inability: Not on file  . Transportation needs:    Medical: Not on file    Non-medical: Not on file  Tobacco Use  . Smoking status: Never Smoker  . Smokeless tobacco: Never Used  Substance and Sexual Activity  . Alcohol use: No    Frequency: Never  . Drug use: No  . Sexual activity: Not Currently  Lifestyle  . Physical activity:    Days per week: Not on file    Minutes per session: Not on file  . Stress: Not on file  Relationships  . Social connections:    Talks on phone: Not on file    Gets together: Not on file    Attends religious  service: Not on file    Active member of club or organization: Not on file    Attends meetings of clubs or organizations: Not on file    Relationship status: Not on file  . Intimate partner violence:    Fear of current or ex partner: Not on file    Emotionally abused: Not on file    Physically abused: Not on file    Forced sexual activity: Not on file  Other Topics Concern  . Not on file  Social History Narrative   ** Merged History Encounter **       Widowed. Moved from [place]. 1 son, 3 grandchildren. Retired. Once worked as a Agricultural engineer.     Past Surgical History:    Procedure Laterality Date  . ABDOMINAL HYSTERECTOMY  1984  . ABDOMINAL HYSTERECTOMY    . APPENDECTOMY  1958  . CHOLECYSTECTOMY    . GALLBLADDER SURGERY    . MANDIBLE FRACTURE SURGERY  07/13/2016  . TONSILLECTOMY  1958  . TONSILLECTOMY    . WRIST FRACTURE SURGERY Right    2000    Family History  Problem Relation Age of Onset  . Hypertension Mother   . Stroke Mother   . Stroke Father   . Hypertension Father   . Hypertension Sister   . Kidney disease Sister     Allergies  Allergen Reactions  . Sulfamethoxazole-Trimethoprim   . Penicillins Rash    Current Outpatient Medications on File Prior to Visit  Medication Sig Dispense Refill  . diltiazem (TIAZAC) 300 MG 24 hr capsule Take 1 capsule (300 mg total) by mouth daily. 90 capsule 3  . glimepiride (AMARYL) 2 MG tablet Take 2 mg by mouth daily with breakfast.    . losartan (COZAAR) 100 MG tablet Take 1 tablet (100 mg total) by mouth daily. 90 tablet 1  . metFORMIN (GLUCOPHAGE) 500 MG tablet Take 500 mg by mouth 2 (two) times daily with a meal.    . ONE TOUCH ULTRA TEST test strip USE AS DIRECTED TEST 3 TIMES DAILY E11.9 100 each 2  . Vitamin D, Ergocalciferol, (DRISDOL) 50000 units CAPS capsule Take 1 capsule (50,000 Units total) by mouth once a week. 12 capsule 3   No current facility-administered medications on file prior to visit.     BP (!) 142/66   Pulse 78   Temp 98 F (36.7 C) (Oral)   Ht 5\' 5"  (1.651 m)   Wt 126 lb (57.2 kg)   SpO2 97%   BMI 20.97 kg/m    Objective:   Physical Exam  Constitutional: She is oriented to person, place, and time. She appears well-nourished.  HENT:  Mouth/Throat: No oropharyngeal exudate.  Eyes: Pupils are equal, round, and reactive to light. EOM are normal.  Neck: Neck supple. No thyromegaly present.  Cardiovascular: Normal rate and regular rhythm.  Respiratory: Effort normal and breath sounds normal.  GI: Soft. Bowel sounds are normal. There is no tenderness.   Musculoskeletal: Normal range of motion.  Neurological: She is alert and oriented to person, place, and time.  Skin: Skin is warm and dry.  Psychiatric: She has a normal mood and affect.           Assessment & Plan:

## 2018-03-21 NOTE — Patient Instructions (Signed)
Call the [facility] to schedule your mammogram for October this year.  Please notify me of the name and fax for the mail order pharmacy.  Start exercising. You should be getting 150 minutes of exercise weekly.  Continue to limit candy as discussed. Make sure to eat plenty of vegetables, lean protein, whole grains.  Ensure you are consuming 64 ounces of water daily.  Please schedule a follow up appointment in 6 months for diabetes check.   It was a pleasure to see you today!

## 2018-03-21 NOTE — Assessment & Plan Note (Signed)
Stable in the office today, continue losartan 100 mg.  BMP with reduced renal function which is not typical for her. Repeat renal function in 1 month.

## 2018-03-21 NOTE — Assessment & Plan Note (Signed)
Compliant to calcium and Vitamin D as prescribed. Repeat bone density scan in 2020. Continues to decline bisphosphonate treatment.

## 2018-03-21 NOTE — Assessment & Plan Note (Signed)
Did well with physical therapy, would like to resume. She will meet with orthopedics tomorrow to discuss.   Compliant to Calcium and Vitamin D. Continues to decline bisphosphonate treatment.

## 2018-03-22 DIAGNOSIS — M5416 Radiculopathy, lumbar region: Secondary | ICD-10-CM | POA: Diagnosis not present

## 2018-03-30 ENCOUNTER — Other Ambulatory Visit: Payer: Self-pay | Admitting: Primary Care

## 2018-03-30 DIAGNOSIS — I10 Essential (primary) hypertension: Secondary | ICD-10-CM

## 2018-04-04 ENCOUNTER — Other Ambulatory Visit: Payer: Self-pay | Admitting: Primary Care

## 2018-04-04 DIAGNOSIS — E119 Type 2 diabetes mellitus without complications: Secondary | ICD-10-CM

## 2018-04-04 DIAGNOSIS — I10 Essential (primary) hypertension: Secondary | ICD-10-CM

## 2018-04-04 DIAGNOSIS — M810 Age-related osteoporosis without current pathological fracture: Secondary | ICD-10-CM

## 2018-04-04 MED ORDER — METFORMIN HCL 500 MG PO TABS: ORAL_TABLET | ORAL | 3 refills | Status: DC

## 2018-04-04 MED ORDER — GLUCOSE BLOOD VI STRP: ORAL_STRIP | 2 refills | Status: DC

## 2018-04-04 MED ORDER — LOSARTAN POTASSIUM 100 MG PO TABS: ORAL_TABLET | ORAL | 1 refills | Status: DC

## 2018-04-04 MED ORDER — VITAMIN D (ERGOCALCIFEROL) 1.25 MG (50000 UNIT) PO CAPS: 50000.0000 [IU] | ORAL_CAPSULE | ORAL | 0 refills | Status: DC

## 2018-04-04 MED ORDER — DILTIAZEM HCL ER BEADS 300 MG PO CP24: ORAL_CAPSULE | ORAL | 3 refills | Status: DC

## 2018-04-04 MED ORDER — GLIMEPIRIDE 2 MG PO TABS: ORAL_TABLET | ORAL | 3 refills | Status: DC

## 2018-04-04 NOTE — Telephone Encounter (Signed)
Noted, refills sent to pharmacy. 

## 2018-04-04 NOTE — Telephone Encounter (Signed)
See below note; last office visit 03/21/18 with [name]; will route to office for review. Marland Kitchen

## 2018-04-04 NOTE — Telephone Encounter (Signed)
Copied from [place] [id number]. Topic: Quick Communication - Rx Refill/Question >> Apr 04, 2018  1:39 PM Sheppard Coil, [employer] wrote: Medication:  diltiazem (TIAZAC) 300 MG 24 hr capsule glimepiride (AMARYL) 2 MG tablet losartan (COZAAR) 100 MG tablet metFORMIN (GLUCOPHAGE) 500 MG tablet ONE TOUCH ULTRA TEST test strip Vitamin D, Ergocalciferol, (DRISDOL) 50000 units CAPS capsule  Has the patient contacted their pharmacy? No - switching pharmacy (Agent: If no, request that the patient contact the pharmacy for the refill.) (Agent: If yes, when and what did the pharmacy advise?)  Preferred Pharmacy (with phone number or street name): [place] [place] [facility] [address] [place] [address] [place] [address] Phone: [phone] Fax: [phone]    Agent: Please be advised that RX refills may take up to 3 business days. We ask that you follow-up with your pharmacy.

## 2018-04-04 NOTE — Telephone Encounter (Signed)
Please advise. Metformin and Glimepiride have not been prescribed by Allie Bossier yet.  Last seen on 03/21/2018.

## 2018-04-20 ENCOUNTER — Ambulatory Visit (INDEPENDENT_AMBULATORY_CARE_PROVIDER_SITE_OTHER): Payer: Medicare Other | Admitting: Podiatry

## 2018-04-20 ENCOUNTER — Encounter: Payer: Self-pay | Admitting: Podiatry

## 2018-04-20 DIAGNOSIS — B351 Tinea unguium: Secondary | ICD-10-CM

## 2018-04-20 DIAGNOSIS — M79675 Pain in left toe(s): Secondary | ICD-10-CM

## 2018-04-20 DIAGNOSIS — M79674 Pain in right toe(s): Secondary | ICD-10-CM | POA: Diagnosis not present

## 2018-04-20 DIAGNOSIS — E119 Type 2 diabetes mellitus without complications: Secondary | ICD-10-CM

## 2018-04-20 NOTE — Progress Notes (Signed)
Complaint:  Visit Type: Patient returns to my office for continued preventative foot care services. Complaint: Patient states" my nails have grown long and thick and become painful to walk and wear shoes" Patient has been diagnosed with DM with no foot complications. The patient presents for preventative foot care services. No changes to ROS  Podiatric Exam: Vascular: dorsalis pedis and posterior tibial pulses are palpable bilateral. Capillary return is immediate. Temperature gradient is WNL. Skin turgor WNL  Sensorium: Normal Semmes Weinstein monofilament test. Normal tactile sensation bilaterally. Nail Exam: Pt has thick disfigured discolored nails with subungual debris noted bilateral entire nail hallux  Ulcer Exam: There is no evidence of ulcer or pre-ulcerative changes or infection. Orthopedic Exam: Muscle tone and strength are WNL. No limitations in general ROM. No crepitus or effusions noted. Foot type and digits show no abnormalities. Bony prominences are unremarkable. Skin: No Porokeratosis. No infection or ulcers  Diagnosis:  Onychomycosis, , Pain in right toe, pain in left toes  Treatment & Plan Procedures and Treatment: Consent by patient was obtained for treatment procedures.   Debridement of mycotic and hypertrophic toenails, 1 through 5 bilateral and clearing of subungual debris. No ulceration, no infection noted.  Return Visit-Office Procedure: Patient instructed to return to the office for a follow up visit 3 months for continued evaluation and treatment.    Osbaldo Mark DPM 

## 2018-05-23 ENCOUNTER — Ambulatory Visit: Payer: Medicare Other | Admitting: Family Medicine

## 2018-05-23 ENCOUNTER — Encounter: Payer: Self-pay | Admitting: Family Medicine

## 2018-05-23 ENCOUNTER — Ambulatory Visit (INDEPENDENT_AMBULATORY_CARE_PROVIDER_SITE_OTHER): Payer: Medicare Other | Admitting: Family Medicine

## 2018-05-23 ENCOUNTER — Telehealth: Payer: Self-pay

## 2018-05-23 VITALS — BP 166/72 | HR 81 | Temp 98.1°F | Ht 65.0 in | Wt 126.2 lb

## 2018-05-23 DIAGNOSIS — R350 Frequency of micturition: Secondary | ICD-10-CM | POA: Diagnosis not present

## 2018-05-23 DIAGNOSIS — N3000 Acute cystitis without hematuria: Secondary | ICD-10-CM

## 2018-05-23 HISTORY — DX: Acute cystitis without hematuria: N30.00

## 2018-05-23 LAB — POC URINALSYSI DIPSTICK (AUTOMATED)
BILIRUBIN UA: NEGATIVE
GLUCOSE UA: NEGATIVE
Ketones, UA: NEGATIVE
NITRITE UA: NEGATIVE
PH UA: 6.5 (ref 5.0–8.0)
Protein, UA: NEGATIVE
RBC UA: NEGATIVE
Spec Grav, UA: 1.015 (ref 1.010–1.025)
UROBILINOGEN UA: 0.2 U/dL

## 2018-05-23 MED ORDER — CEPHALEXIN 500 MG PO CAPS: 500.0000 mg | ORAL_CAPSULE | Freq: Two times a day (BID) | ORAL | 0 refills | Status: DC

## 2018-05-23 NOTE — Patient Instructions (Addendum)
Keep drinking lots of water  Take keflex as directed  Please alert [place] if your symptoms change or worsen    Urinary Tract Infection, Adult A urinary tract infection (UTI) is an infection of any part of the urinary tract, which includes the kidneys, ureters, bladder, and urethra. These organs make, store, and get rid of urine in the body. UTI can be a bladder infection (cystitis) or kidney infection (pyelonephritis). What are the causes? This infection may be caused by fungi, viruses, or bacteria. Bacteria are the most common cause of UTIs. This condition can also be caused by repeated incomplete emptying of the bladder during urination. What increases the risk? This condition is more likely to develop if:  You ignore your need to urinate or hold urine for long periods of time.  You do not empty your bladder completely during urination.  You wipe back to front after urinating or having a bowel movement, if you are female.  You are uncircumcised, if you are female.  You are constipated.  You have a urinary catheter that stays in place (indwelling).  You have a weak defense (immune) system.  You have a medical condition that affects your bowels, kidneys, or bladder.  You have diabetes.  You take antibiotic medicines frequently or for long periods of time, and the antibiotics no longer work well against certain types of infections (antibiotic resistance).  You take medicines that irritate your urinary tract.  You are exposed to chemicals that irritate your urinary tract.  You are female.  What are the signs or symptoms? Symptoms of this condition include:  Fever.  Frequent urination or passing small amounts of urine frequently.  Needing to urinate urgently.  Pain or burning with urination.  Urine that smells bad or unusual.  Cloudy urine.  Pain in the lower abdomen or back.  Trouble urinating.  Blood in the urine.  Vomiting or being less hungry than  normal.  Diarrhea or abdominal pain.  Vaginal discharge, if you are female.  How is this diagnosed? This condition is diagnosed with a medical history and physical exam. You will also need to provide a urine sample to test your urine. Other tests may be done, including:  Blood tests.  Sexually transmitted disease (STD) testing.  If you have had more than one UTI, a cystoscopy or imaging studies may be done to determine the cause of the infections. How is this treated? Treatment for this condition often includes a combination of two or more of the following:  Antibiotic medicine.  Other medicines to treat less common causes of UTI.  Over-the-counter medicines to treat pain.  Drinking enough water to stay hydrated.  Follow these instructions at home:  Take over-the-counter and prescription medicines only as told by your health care provider.  If you were prescribed an antibiotic, take it as told by your health care provider. Do not stop taking the antibiotic even if you start to feel better.  Avoid alcohol, caffeine, tea, and carbonated beverages. They can irritate your bladder.  Drink enough fluid to keep your urine clear or pale yellow.  Keep all follow-up visits as told by your health care provider. This is important.  Make sure to: ? Empty your bladder often and completely. Do not hold urine for long periods of time. ? Empty your bladder before and after sex. ? Wipe from front to back after a bowel movement if you are female. Use each tissue one time when you wipe. Contact a health care  provider if:  You have back pain.  You have a fever.  You feel nauseous or vomit.  Your symptoms do not get better after 3 days.  Your symptoms go away and then return. Get help right away if:  You have severe back pain or lower abdominal pain.  You are vomiting and cannot keep down any medicines or water. This information is not intended to replace advice given to you by  your health care provider. Make sure you discuss any questions you have with your health care provider. Document Released: 05/05/2005 Document Revised: 01/07/2016 Document Reviewed: 06/16/2015 Elsevier Interactive Patient Education  [employer].

## 2018-05-23 NOTE — Telephone Encounter (Signed)
I spoke with pt and she scheduled appt today at 10 am with Dr Glori Bickers.

## 2018-05-23 NOTE — Assessment & Plan Note (Signed)
Trace leukocytes on UA but classic symptoms Disc imp of fluid intake  Keflex px bid for 7d Pend cx result  Handout given and disc ways to prevent uti Alert if symptoms worsen  Meds ordered this encounter  Medications  . cephALEXin (KEFLEX) 500 MG capsule    Sig: Take 1 capsule (500 mg total) by mouth 2 (two) times daily.    Dispense:  14 capsule    Refill:  0

## 2018-05-23 NOTE — Progress Notes (Signed)
Subjective:    Patient ID: Morgan Bowen, female    DOB: 01-09-1936, 82 y.o.   MRN: [id number]  HPI Here for urinary symptoms  82 yo pt of NP Clark with DM  GFR is 50.2 Lab Results  Component Value Date   CREATININE 1.11 03/16/2018   BUN 17 03/16/2018   NA 139 03/16/2018   K 4.7 03/16/2018   CL 104 03/16/2018   CO2 26 03/16/2018   Symptoms started 9 pm  Bladder pressure  Frequent urination  Not a lot of pain with urination  No blood in urine  No fever  No nausea   Started drinking vinegar water  Lots of fluids   Has allergy to pcn but does well with keflex (had it in April)    bp is elevated today BP Readings from Last 3 Encounters:  05/23/18 (!) 166/72  03/21/18 (!) 142/66  03/16/18 (!) 150/60   UA -tr leukocytes  Results for orders placed or performed in visit on 05/23/18  POCT Urinalysis Dipstick (Automated)  Result Value Ref Range   Color, UA Light Yellow    Clarity, UA Clear    Glucose, UA Negative Negative   Bilirubin, UA Negative    Ketones, UA Negaitve    Spec Grav, UA 1.015 1.010 - 1.025   Blood, UA Negative    pH, UA 6.5 5.0 - 8.0   Protein, UA Negative Negative   Urobilinogen, UA 0.2 0.2 or 1.0 E.U./dL   Nitrite, UA Negative    Leukocytes, UA Trace (A) Negative    Patient Active Problem List   Diagnosis Date Noted  . Acute cystitis 05/23/2018  . Decreased renal function 03/21/2018  . Non-traumatic compression fracture of lumbar vertebra with routine healing 08/26/2017  . Anxiety and depression 08/26/2017  . Type 2 diabetes mellitus ([place]) 04/27/2017  . Essential hypertension 04/27/2017  . Osteoporosis 04/27/2017   Past Medical History:  Diagnosis Date  . Anxiety   . Arthritis   . Chickenpox   . Diabetes mellitus without complication ([place])   . GERD (gastroesophageal reflux disease)   . Hypertension   . Osteoporosis   . Type 2 diabetes mellitus ([place])   . Urinary tract infection    Past Surgical History:  Procedure Laterality Date    . ABDOMINAL HYSTERECTOMY  1984  . ABDOMINAL HYSTERECTOMY    . APPENDECTOMY  1958  . CHOLECYSTECTOMY    . GALLBLADDER SURGERY    . MANDIBLE FRACTURE SURGERY  07/13/2016  . TONSILLECTOMY  1958  . TONSILLECTOMY    . WRIST FRACTURE SURGERY Right    2000   Social History   Tobacco Use  . Smoking status: Never Smoker  . Smokeless tobacco: Never Used  Substance Use Topics  . Alcohol use: No    Frequency: Never  . Drug use: No   Family History  Problem Relation Age of Onset  . Hypertension Mother   . Stroke Mother   . Stroke Father   . Hypertension Father   . Hypertension Sister   . Kidney disease Sister    Allergies  Allergen Reactions  . Sulfamethoxazole-Trimethoprim   . Penicillins Rash   Current Outpatient Medications on File Prior to Visit  Medication Sig Dispense Refill  . diclofenac sodium (VOLTAREN) 1 % GEL     . diltiazem (TIAZAC) 300 MG 24 hr capsule Take 1 capsule by mouth once daily for blood pressure. 90 capsule 3  . glimepiride (AMARYL) 2 MG tablet Take 1 tablet by  mouth with breakfast for diabetes. 180 tablet 3  . glucose blood (ONE TOUCH ULTRA TEST) test strip USE AS DIRECTED TEST 3 TIMES DAILY E11.9 100 each 2  . losartan (COZAAR) 100 MG tablet Take 1 tablet by mouth once daily for blood pressure. 90 tablet 1  . metFORMIN (GLUCOPHAGE) 500 MG tablet Take 1 tablet by mouth twice daily with food for diabetes. 180 tablet 3  . valsartan (DIOVAN) 320 MG tablet valsartan 320 mg tablet  TAKE 1 TABLET BY MOUTH EVERY DAY    . Vitamin D, Ergocalciferol, (DRISDOL) 50000 units CAPS capsule Take 1 capsule (50,000 Units total) by mouth once a week. 12 capsule 0   No current facility-administered medications on file prior to visit.       Review of Systems  Constitutional: Positive for fatigue. Negative for activity change, appetite change and fever.  HENT: Negative for congestion and sore throat.   Eyes: Negative for itching and visual disturbance.  Respiratory:  Negative for cough and shortness of breath.   Cardiovascular: Negative for leg swelling.  Gastrointestinal: Negative for abdominal distention, abdominal pain, constipation, diarrhea and nausea.  Endocrine: Negative for cold intolerance and polydipsia.  Genitourinary: Positive for frequency and urgency. Negative for decreased urine volume, difficulty urinating, dysuria, flank pain, hematuria and vaginal discharge.  Musculoskeletal: Negative for myalgias.  Skin: Negative for rash.  Allergic/Immunologic: Negative for immunocompromised state.  Neurological: Negative for dizziness and weakness.  Hematological: Negative for adenopathy.       Objective:   Physical Exam  Constitutional: She appears well-developed and well-nourished. No distress.  Well appearing elderly female   HENT:  Head: Normocephalic and atraumatic.  Eyes: Pupils are equal, round, and reactive to light. Conjunctivae and EOM are normal.  Neck: Normal range of motion. Neck supple.  Cardiovascular: Normal rate, regular rhythm and normal heart sounds.  Pulmonary/Chest: Effort normal and breath sounds normal.  Abdominal: Soft. Bowel sounds are normal. She exhibits no distension. There is tenderness. There is no rebound.  No cva tenderness  Mild suprapubic tenderness  Musculoskeletal: She exhibits no edema.  Lymphadenopathy:    She has no cervical adenopathy.  Neurological: She is alert. Coordination normal.  Skin: No rash noted.  Psychiatric: She has a normal mood and affect.          Assessment & Plan:   Problem List Items Addressed This Visit      Genitourinary   Acute cystitis - Primary    Trace leukocytes on UA but classic symptoms Disc imp of fluid intake  Keflex px bid for 7d Pend cx result  Handout given and disc ways to prevent uti Alert if symptoms worsen  Meds ordered this encounter  Medications  . cephALEXin (KEFLEX) 500 MG capsule    Sig: Take 1 capsule (500 mg total) by mouth 2 (two) times  daily.    Dispense:  14 capsule    Refill:  0         Relevant Orders   Urine Culture    Other Visit Diagnoses    Urinary frequency       Relevant Orders   POCT Urinalysis Dipstick (Automated) (Completed)

## 2018-05-23 NOTE — Telephone Encounter (Signed)
PLEASE NOTE: All timestamps contained within this report are represented as [place] Standard Time. CONFIDENTIALTY NOTICE: This fax transmission is intended only for the addressee. It contains information that is legally privileged, confidential or otherwise protected from use or disclosure. If you are not the intended recipient, you are strictly prohibited from reviewing, disclosing, copying using or disseminating any of this information or taking any action in reliance on or regarding this information. If you have received this fax in error, please notify us immediately by telephone so that we can arrange for its return to [place]. Phone: [phone], Toll-Free: [phone], Fax: [phone] Page: 1 of 2 Call Id: [id number] [place] Patient Name: Morgan Bowen Gender: Female DOB: [birth year] Age: 82 Y 3 M 10 D Return Phone Number: [phone] (Primary), [phone] (Secondary) Address: City/State/Zip: [place] [address] Client [facility] Night - Client Client Site [place] Physician [name] - NP Contact Type Call Who Is Calling Patient / Member / Family / Caregiver Call Type Triage / Clinical Relationship To Patient Self Return Phone Number [phone] (Primary) Chief Complaint Urination Frequency Reason for Call Symptomatic / Request for Health Information Initial Comment Caller states pressure and very frequent urination. Possible UTI Translation No Nurse Assessment Nurse: Sumner Boast, RN, Enid Derry Date/Time Eilene Ghazi Time): 05/22/2018 9:12:35 PM Confirm and document reason for call. If symptomatic, describe symptoms. ---Caller states pressure and very frequent urination. It is not burning. Thinks she has a UTI. No blood in urine. Has had UTI's previously. No fever - 97.5 temporal. Does the patient have any new or worsening  symptoms? ---Yes Will a triage be completed? ---Yes Related visit to physician within the last 2 weeks? ---No Does the PT have any chronic conditions? (i.e. diabetes, asthma, this includes High risk factors for pregnancy, etc.) ---Yes List chronic conditions. ---Diabetes Hypertension Is this a behavioral health or substance abuse call? ---No Guidelines Guideline Title Affirmed Question Affirmed Notes Nurse Date/Time (Eastern Time) Urinary Symptoms Urinating more frequently than usual (i.e., frequency) Sumner Boast, RN, Enid Derry 05/22/2018 9:17:11 PM Disp. Time Eilene Ghazi Time) Disposition Final User 05/22/2018 9:21:55 PM See PCP within 24 Hours Yes Sumner Boast, RN, Teola Bradley Disagree/Comply Comply Caller Understands Yes PLEASE NOTE: All timestamps contained within this report are represented as [place] Standard Time. CONFIDENTIALTY NOTICE: This fax transmission is intended only for the addressee. It contains information that is legally privileged, confidential or otherwise protected from use or disclosure. If you are not the intended recipient, you are strictly prohibited from reviewing, disclosing, copying using or disseminating any of this information or taking any action in reliance on or regarding this information. If you have received this fax in error, please notify us immediately by telephone so that we can arrange for its return to [place]. Phone: [phone], Toll-Free: [phone], Fax: [phone] Page: 2 of 2 Call Id: [id number] PreDisposition Go to ED Care Advice Given Per Guideline SEE PCP WITHIN 24 HOURS: * IF OFFICE WILL BE OPEN: You need to be seen within the next 24 hours. Call your doctor (or NP/PA) when the office opens and make an appointment. CALL BACK IF: * Fever occurs * Unable to urinate and bladder feels full * You become worse. CARE ADVICE given per Urinary Symptoms (Adult) guideline. Referrals REFERRED TO PCP OFFICE

## 2018-05-23 NOTE — Telephone Encounter (Signed)
I will see her then  

## 2018-05-24 ENCOUNTER — Other Ambulatory Visit: Payer: Self-pay | Admitting: Primary Care

## 2018-05-24 DIAGNOSIS — I10 Essential (primary) hypertension: Secondary | ICD-10-CM

## 2018-05-24 LAB — URINE CULTURE
MICRO NUMBER:: 91238548
RESULT: NO GROWTH
SPECIMEN QUALITY:: ADEQUATE

## 2018-05-24 MED ORDER — IRBESARTAN 150 MG PO TABS: ORAL_TABLET | ORAL | 0 refills | Status: DC

## 2018-05-31 ENCOUNTER — Ambulatory Visit
Admission: RE | Admit: 2018-05-31 | Discharge: 2018-05-31 | Disposition: A | Discharge status: Home / Self Care | Payer: Medicare Other | Source: Ambulatory Visit | Attending: Primary Care | Admitting: Primary Care

## 2018-05-31 DIAGNOSIS — Z1239 Encounter for other screening for malignant neoplasm of breast: Secondary | ICD-10-CM

## 2018-05-31 DIAGNOSIS — Z1231 Encounter for screening mammogram for malignant neoplasm of breast: Secondary | ICD-10-CM | POA: Diagnosis not present

## 2018-06-05 ENCOUNTER — Other Ambulatory Visit: Payer: Self-pay

## 2018-06-05 ENCOUNTER — Other Ambulatory Visit: Payer: Self-pay | Admitting: Primary Care

## 2018-06-05 DIAGNOSIS — Z7984 Long term (current) use of oral hypoglycemic drugs: Secondary | ICD-10-CM | POA: Insufficient documentation

## 2018-06-05 DIAGNOSIS — E119 Type 2 diabetes mellitus without complications: Secondary | ICD-10-CM | POA: Diagnosis not present

## 2018-06-05 DIAGNOSIS — I10 Essential (primary) hypertension: Secondary | ICD-10-CM | POA: Insufficient documentation

## 2018-06-05 DIAGNOSIS — Z79899 Other long term (current) drug therapy: Secondary | ICD-10-CM | POA: Insufficient documentation

## 2018-06-05 DIAGNOSIS — M810 Age-related osteoporosis without current pathological fracture: Secondary | ICD-10-CM

## 2018-06-05 LAB — BASIC METABOLIC PANEL
ANION GAP: 10 (ref 5–15)
BUN: 19 mg/dL (ref 8–23)
CO2: 22 mmol/L (ref 22–32)
Calcium: 9.6 mg/dL (ref 8.9–10.3)
Chloride: 107 mmol/L (ref 98–111)
Creatinine, Ser: 1.04 mg/dL — ABNORMAL HIGH (ref 0.44–1.00)
GFR calc Af Amer: 57 mL/min — ABNORMAL LOW (ref >60)
GFR, EST NON AFRICAN AMERICAN: 49 mL/min — AB (ref >60)
GLUCOSE: 105 mg/dL — AB (ref 70–99)
POTASSIUM: 4 mmol/L (ref 3.5–5.1)
Sodium: 139 mmol/L (ref 135–145)

## 2018-06-05 LAB — CBC
HEMATOCRIT: 38.4 % (ref 36.0–46.0)
HEMOGLOBIN: 12.7 g/dL (ref 12.0–15.0)
MCH: 31.2 pg (ref 26.0–34.0)
MCHC: 33.1 g/dL (ref 30.0–36.0)
MCV: 94.3 fL (ref 80.0–100.0)
Platelets: 254 10*3/uL (ref 150–400)
RBC: 4.07 MIL/uL (ref 3.87–5.11)
RDW: 13 % (ref 11.5–15.5)
WBC: 9.2 10*3/uL (ref 4.0–10.5)
nRBC: 0 % (ref 0.0–0.2)

## 2018-06-05 NOTE — ED Triage Notes (Signed)
Reports since recent change in medications states blood pressure has been running high (206/07).  Patient reports had one occasion of dizziness.

## 2018-06-06 ENCOUNTER — Encounter: Payer: Self-pay | Admitting: Emergency Medicine

## 2018-06-06 ENCOUNTER — Emergency Department
Admission: EM | Admit: 2018-06-06 | Discharge: 2018-06-06 | Disposition: A | Discharge status: Home / Self Care | Payer: Medicare Other | Attending: Emergency Medicine | Admitting: Emergency Medicine

## 2018-06-06 DIAGNOSIS — I10 Essential (primary) hypertension: Secondary | ICD-10-CM | POA: Diagnosis not present

## 2018-06-06 LAB — URINALYSIS, COMPLETE (UACMP) WITH MICROSCOPIC
Bacteria, UA: NONE SEEN
Bilirubin Urine: NEGATIVE
GLUCOSE, UA: NEGATIVE mg/dL
KETONES UR: NEGATIVE mg/dL
NITRITE: NEGATIVE
PROTEIN: NEGATIVE mg/dL
Specific Gravity, Urine: 1.002 — ABNORMAL LOW (ref 1.005–1.030)
pH: 7 (ref 5.0–8.0)

## 2018-06-06 MED ORDER — METOPROLOL SUCCINATE ER 25 MG PO TB24: 25.0000 mg | ORAL_TABLET | Freq: Every day | ORAL | 0 refills | Status: DC

## 2018-06-06 MED ORDER — METOPROLOL TARTRATE 25 MG PO TABS
25.0000 mg | ORAL_TABLET | Freq: Once | ORAL | Status: AC
  Administered 2018-06-06: 25 mg via ORAL
  Filled 2018-06-06: qty 1

## 2018-06-06 NOTE — ED Notes (Signed)
Patient discharged to home per MD order. Patient in stable condition, and deemed medically cleared by ED provider for discharge. Discharge instructions reviewed with patient/family using "Teach Back"; verbalized understanding of medication education and administration, and information about follow-up care. Denies further concerns. ° °

## 2018-06-06 NOTE — Telephone Encounter (Signed)
Last prescribed on 04/04/2018 Last office visit on 05/23/2018 for acute Dr Glori Bickers

## 2018-06-06 NOTE — ED Notes (Addendum)
Pt states they have recently changed bp meds and had episode of dizziness this am. States bp at home was elevated. Pt denies any pain at this time, no shob. States was started on avapro 5 days ago.

## 2018-06-06 NOTE — ED Provider Notes (Signed)
[facility] Emergency Department Provider Note  ____________________________________________   First MD Initiated Contact with Patient 06/06/18 0134     (approximate)  I have reviewed the triage vital signs and the nursing notes.   HISTORY  Chief Complaint Hypertension   HPI Morgan Bowen is a 82 y.o. female who self presents the emergency department after checking her blood pressure at home and noted it was 206/97 tonight.  She is asymptomatic.  No chest pain, shortness of breath, abdominal pain, nausea, vomiting, double vision, blurred vision, numbness, weakness, headache.  Her antihypertensives were changed recently and she is concerned that her new regimen is not adequate.  She checked her blood pressure only because she routinely checks it every night.  She is also feeling increasing amounts of stress recently as this week is the anniversary of her husband's death.    Past Medical History:  Diagnosis Date  . Anxiety   . Arthritis   . Chickenpox   . Diabetes mellitus without complication ([place])   . GERD (gastroesophageal reflux disease)   . Hypertension   . Osteoporosis   . Type 2 diabetes mellitus ([place])   . Urinary tract infection     Patient Active Problem List   Diagnosis Date Noted  . Acute cystitis 05/23/2018  . Decreased renal function 03/21/2018  . Non-traumatic compression fracture of lumbar vertebra with routine healing 08/26/2017  . Anxiety and depression 08/26/2017  . Type 2 diabetes mellitus ([place]) 04/27/2017  . Essential hypertension 04/27/2017  . Osteoporosis 04/27/2017    Past Surgical History:  Procedure Laterality Date  . ABDOMINAL HYSTERECTOMY  1984  . ABDOMINAL HYSTERECTOMY    . APPENDECTOMY  1958  . CHOLECYSTECTOMY    . GALLBLADDER SURGERY    . MANDIBLE FRACTURE SURGERY  07/13/2016  . TONSILLECTOMY  1958  . TONSILLECTOMY    . WRIST FRACTURE SURGERY Right    2000    Prior to Admission medications   Medication Sig  Start Date End Date Taking? Authorizing Provider  cephALEXin (KEFLEX) 500 MG capsule Take 1 capsule (500 mg total) by mouth 2 (two) times daily. 05/23/18   Tower, Wynelle Fanny, MD  diclofenac sodium (VOLTAREN) 1 % GEL  03/22/18   [provider]  diltiazem (TIAZAC) 300 MG 24 hr capsule Take 1 capsule by mouth once daily for blood pressure. 04/04/18   [name], NP  glimepiride (AMARYL) 2 MG tablet Take 1 tablet by mouth with breakfast for diabetes. 04/04/18   [name], NP  glucose blood (ONE TOUCH ULTRA TEST) test strip USE AS DIRECTED TEST 3 TIMES DAILY E11.9 04/04/18   [name], NP  irbesartan (AVAPRO) 150 MG tablet Take 1 tablet by mouth once daily for blood pressure. 05/24/18   [name], NP  metFORMIN (GLUCOPHAGE) 500 MG tablet Take 1 tablet by mouth twice daily with food for diabetes. 04/04/18   [name], NP  metoprolol succinate (TOPROL XL) 25 MG 24 hr tablet Take 1 tablet (25 mg total) by mouth daily. 06/06/18 06/06/19  Darel Hong, MD  Vitamin D, Ergocalciferol, (DRISDOL) 50000 units CAPS capsule TAKE 1 CAPSULE BY MOUTH  ONCE EVERY WEEK 06/07/18   [name], NP    Allergies Sulfamethoxazole-trimethoprim and Penicillins  Family History  Problem Relation Age of Onset  . Hypertension Mother   . Stroke Mother   . Stroke Father   . Hypertension Father   . Hypertension Sister   . Kidney disease  Sister   . Breast cancer Neg Hx     Social History Social History   Tobacco Use  . Smoking status: Never Smoker  . Smokeless tobacco: Never Used  Substance Use Topics  . Alcohol use: No    Frequency: Never  . Drug use: No    Review of Systems Constitutional: No fever/chills Eyes: No visual changes. ENT: No sore throat. Cardiovascular: Denies chest pain. Respiratory: Denies shortness of breath. Gastrointestinal: No abdominal pain.  No nausea, no vomiting.  No diarrhea.  No constipation. Genitourinary: Negative for  dysuria. Musculoskeletal: Negative for back pain. Skin: Negative for rash. Neurological: Negative for headaches, focal weakness or numbness.   ____________________________________________   PHYSICAL EXAM:  VITAL SIGNS: ED Triage Vitals  Enc Vitals Group     BP 06/05/18 2309 (!) 203/75     Pulse Rate 06/05/18 2309 (!) 112     Resp 06/05/18 2309 18     Temp 06/05/18 2309 97.9 F (36.6 C)     Temp Source 06/05/18 2309 Oral     SpO2 06/05/18 2309 97 %     Weight --      Height --      Head Circumference --      Peak Flow --      Pain Score 06/05/18 2310 0     Pain Loc --      Pain Edu? --      Excl. in [place]? --     Constitutional: Alert and oriented x4 pleasant cooperative speaks in full clear sentences no diaphoresis Eyes: PERRL EOMI. midrange and brisk no nystagmus Head: Atraumatic. Nose: No congestion/rhinnorhea. Mouth/Throat: No trismus Neck: No stridor.   Cardiovascular: Normal rate, regular rhythm. Grossly normal heart sounds.  Good peripheral circulation. Respiratory: Normal respiratory effort.  No retractions. Lungs CTAB and moving good air Gastrointestinal: Soft nontender Musculoskeletal: No lower extremity edema   Neurologic:  Normal speech and language. No gross focal neurologic deficits are appreciated. Skin:  Skin is warm, dry and intact. No rash noted. Psychiatric: Mood and affect are normal. Speech and behavior are normal.    ____________________________________________   DIFFERENTIAL includes but not limited to  Asymptomatic hypertension, endorgan damage, grief reaction ____________________________________________   LABS (all labs ordered are listed, but only abnormal results are displayed)  Labs Reviewed  BASIC METABOLIC PANEL - Abnormal; Notable for the following components:      Result Value   Glucose, Bld 105 (*)    Creatinine, Ser 1.04 (*)    GFR calc non Af Amer 49 (*)    GFR calc Af Amer 57 (*)    All other components within normal  limits  URINALYSIS, COMPLETE (UACMP) WITH MICROSCOPIC - Abnormal; Notable for the following components:   Color, Urine COLORLESS (*)    APPearance CLEAR (*)    Specific Gravity, Urine 1.002 (*)    Hgb urine dipstick MODERATE (*)    Leukocytes, UA LARGE (*)    WBC, UA >50 (*)    All other components within normal limits  CBC    Lab work reviewed by me with asymptomatic leuko-urea which does not require treatment __________________________________________  EKG  ED ECG REPORT I, Darel Hong, the attending physician, personally viewed and interpreted this ECG.  Date: 06/05/2018 EKG Time:  Rate: 108 Rhythm: Sinus tachycardia QRS Axis: normal Intervals: normal ST/T Wave abnormalities: normal Narrative Interpretation: no evidence of acute ischemia  ____________________________________________  RADIOLOGY   ____________________________________________   PROCEDURES  Procedure(s) performed: no  Procedures  Critical Care performed: no  ____________________________________________   INITIAL IMPRESSION / ASSESSMENT AND PLAN / ED COURSE  Pertinent labs & imaging results that were available during my care of the patient were reviewed by me and considered in my medical decision making (see chart for details).   As part of my medical decision making, I reviewed the following data within the [place] History obtained from family if available, nursing notes, old chart and ekg, as well as notes from prior ED visits.  Patient comes to the emergency department with asymptomatic hypertension.  She does remain hypertensive here in the emergency department.  We had a lengthy discussion about her grief regarding her deceased husband which likely is contributing to the hypertension.  She is currently taking diltiazem and an ARB Avapro for blood pressure and as her heart rates a little bit elevated today I will add on a low-dose of metoprolol and refer her back to  primary care this week for blood pressure recheck.  Strict return precautions were given.      ____________________________________________   FINAL CLINICAL IMPRESSION(S) / ED DIAGNOSES  Final diagnoses:  Hypertension, unspecified type      NEW MEDICATIONS STARTED DURING THIS VISIT:  Discharge Medication List as of 06/06/2018  2:28 AM    START taking these medications   Details  metoprolol succinate (TOPROL XL) 25 MG 24 hr tablet Take 1 tablet (25 mg total) by mouth daily., Starting Tue 06/06/2018, Until Wed 06/06/2019, Print         Note:  This document was prepared using Dragon voice recognition software and may include unintentional dictation errors.     Darel Hong, MD 06/08/18 2225

## 2018-06-06 NOTE — Discharge Instructions (Signed)
Fortunately today your lab work was very reassuring although your blood pressure was clearly too high.  Please begin taking metoprolol in addition to your previous blood pressure medications and follow-up with your primary care physician this Friday for a recheck.  Return to the emergency department sooner for any concerns whatsoever.  It was a pleasure to take care of you today, and thank you for coming to our emergency department.  If you have any questions or concerns before leaving please ask the nurse to grab me and I'm more than happy to go through your aftercare instructions again.  If you were prescribed any opioid pain medication today such as Norco, Vicodin, Percocet, morphine, hydrocodone, or oxycodone please make sure you do not drive when you are taking this medication as it can alter your ability to drive safely.  If you have any concerns once you are home that you are not improving or are in fact getting worse before you can make it to your follow-up appointment, please do not hesitate to call 911 and come back for further evaluation.  Darel Hong, MD  Results for orders placed or performed during the hospital encounter of 27/78/24  Basic metabolic panel  Result Value Ref Range   Sodium 139 135 - 145 mmol/L   Potassium 4.0 3.5 - 5.1 mmol/L   Chloride 107 98 - 111 mmol/L   CO2 22 22 - 32 mmol/L   Glucose, Bld 105 (H) 70 - 99 mg/dL   BUN 19 8 - 23 mg/dL   Creatinine, Ser 1.04 (H) 0.44 - 1.00 mg/dL   Calcium 9.6 8.9 - 10.3 mg/dL   GFR calc non Af Amer 49 (L) >60 mL/min   GFR calc Af Amer 57 (L) >60 mL/min   Anion gap 10 5 - 15  CBC  Result Value Ref Range   WBC 9.2 4.0 - 10.5 K/uL   RBC 4.07 3.87 - 5.11 MIL/uL   Hemoglobin 12.7 12.0 - 15.0 g/dL   HCT 38.4 36.0 - 46.0 %   MCV 94.3 80.0 - 100.0 fL   MCH 31.2 26.0 - 34.0 pg   MCHC 33.1 30.0 - 36.0 g/dL   RDW 13.0 11.5 - 15.5 %   Platelets 254 150 - 400 K/uL   nRBC 0.0 0.0 - 0.2 %  Urinalysis, Complete w Microscopic    Result Value Ref Range   Color, Urine COLORLESS (A) YELLOW   APPearance CLEAR (A) CLEAR   Specific Gravity, Urine 1.002 (L) 1.005 - 1.030   pH 7.0 5.0 - 8.0   Glucose, UA NEGATIVE NEGATIVE mg/dL   Hgb urine dipstick MODERATE (A) NEGATIVE   Bilirubin Urine NEGATIVE NEGATIVE   Ketones, ur NEGATIVE NEGATIVE mg/dL   Protein, ur NEGATIVE NEGATIVE mg/dL   Nitrite NEGATIVE NEGATIVE   Leukocytes, UA LARGE (A) NEGATIVE   RBC / HPF 0-5 0 - 5 RBC/hpf   WBC, UA >50 (H) 0 - 5 WBC/hpf   Bacteria, UA NONE SEEN NONE SEEN   Squamous Epithelial / LPF 0-5 0 - 5   Mucus PRESENT    Mm 3d Screen Breast Bilateral  Result Date: 05/31/2018 CLINICAL DATA:  Screening. EXAM: DIGITAL SCREENING BILATERAL MAMMOGRAM WITH TOMO AND CAD COMPARISON:  Previous exam(s). ACR Breast Density Category b: There are scattered areas of fibroglandular density. FINDINGS: There are no findings suspicious for malignancy. Images were processed with CAD. IMPRESSION: No mammographic evidence of malignancy. A result letter of this screening mammogram will be mailed directly to the patient.  RECOMMENDATION: Screening mammogram in one year. (Code:SM-B-01Y) BI-RADS CATEGORY  1: Negative. Electronically Signed   By: Abelardo Diesel M.D.   On: 05/31/2018 14:01

## 2018-06-07 NOTE — Telephone Encounter (Signed)
Noted, will repeat Vitamin D levels during upcoming appointment. Refill sent to pharmacy.

## 2018-06-12 ENCOUNTER — Encounter: Payer: Self-pay | Admitting: Primary Care

## 2018-06-12 ENCOUNTER — Ambulatory Visit (INDEPENDENT_AMBULATORY_CARE_PROVIDER_SITE_OTHER): Payer: Medicare Other | Admitting: Primary Care

## 2018-06-12 DIAGNOSIS — I10 Essential (primary) hypertension: Secondary | ICD-10-CM

## 2018-06-12 MED ORDER — IRBESARTAN 150 MG PO TABS: 300.0000 mg | ORAL_TABLET | Freq: Every day | ORAL | 0 refills | Status: DC

## 2018-06-12 NOTE — Patient Instructions (Signed)
We've increased your irbesartan to 300 mg. You may take two of your 150 mg tablets to equal 300 mg for now.   Continue to monitor your blood pressure once daily, record your readings and bring them to your next visit.  Please call or send me a message if you have any problems.   Schedule a follow up visit for 2 weeks for blood pressure check.  It was a pleasure to see you today!

## 2018-06-12 NOTE — Progress Notes (Signed)
Subjective:    Patient ID: Morgan Bowen, female    DOB: [birth year], 82 y.o.   MRN: [id number]  HPI  Morgan Bowen is an 82 year old female who presents today for emergency department follow up.  She presented to [facility] ED on 06/06/18 with complaints of hypertension, readings of 206/97. She was asymptomatic. Her losartan 100 mg tablets were recalled in mid October 2019, so she was switched to irbesartan 150 mg and asked to report elevations in blood pressure readings. A message was sent on 05/30/18 asking for BP readings, no response was provided.  During her stay in the ED ECG with sinus tachycardia at 108, no acute ischemia. Labs (BMP, CBC, UA) unremarkable. Her hypertension was suspected to be secondary to anxiety and grief from her late husband. Metoprolol succinate 25 mg was added to her regimen of irbesartan 150 mg and diltiazem Er 300 mg.   Since her visit to the ED she's checking her BP at home which is running 160-170's/70's-80's. She does continue to experience anxiety as this is the time of year that her husband passed away.   BP Readings from Last 3 Encounters:  06/12/18 (!) 160/72  06/06/18 (!) 173/70  05/23/18 (!) 166/72     Review of Systems  Eyes: Negative for visual disturbance.  Respiratory: Negative for shortness of breath.   Cardiovascular: Negative for chest pain.  Neurological: Negative for dizziness and headaches.       Past Medical History:  Diagnosis Date  . Anxiety   . Arthritis   . Chickenpox   . Diabetes mellitus without complication ([place])   . GERD (gastroesophageal reflux disease)   . Hypertension   . Osteoporosis   . Type 2 diabetes mellitus ([place])   . Urinary tract infection      Social History   Socioeconomic History  . Marital status: Widowed    Spouse name: Not on file  . Number of children: Not on file  . Years of education: Not on file  . Highest education level: Not on file  Occupational History  . Not on file  Social Needs  .  Financial resource strain: Not on file  . Food insecurity:    Worry: Not on file    Inability: Not on file  . Transportation needs:    Medical: Not on file    Non-medical: Not on file  Tobacco Use  . Smoking status: Never Smoker  . Smokeless tobacco: Never Used  Substance and Sexual Activity  . Alcohol use: No    Frequency: Never  . Drug use: No  . Sexual activity: Not Currently  Lifestyle  . Physical activity:    Days per week: Not on file    Minutes per session: Not on file  . Stress: Not on file  Relationships  . Social connections:    Talks on phone: Not on file    Gets together: Not on file    Attends religious service: Not on file    Active member of club or organization: Not on file    Attends meetings of clubs or organizations: Not on file    Relationship status: Not on file  . Intimate partner violence:    Fear of current or ex partner: Not on file    Emotionally abused: Not on file    Physically abused: Not on file    Forced sexual activity: Not on file  Other Topics Concern  . Not on file  Social History Narrative   **  Merged History Encounter **       Widowed. Moved from [place]. 1 son, 3 grandchildren. Retired. Once worked as a Agricultural engineer.     Past Surgical History:  Procedure Laterality Date  . ABDOMINAL HYSTERECTOMY  1984  . ABDOMINAL HYSTERECTOMY    . APPENDECTOMY  1958  . CHOLECYSTECTOMY    . GALLBLADDER SURGERY    . MANDIBLE FRACTURE SURGERY  07/13/2016  . TONSILLECTOMY  1958  . TONSILLECTOMY    . WRIST FRACTURE SURGERY Right    2000    Family History  Problem Relation Age of Onset  . Hypertension Mother   . Stroke Mother   . Stroke Father   . Hypertension Father   . Hypertension Sister   . Kidney disease Sister   . Breast cancer Neg Hx     Allergies  Allergen Reactions  . Sulfamethoxazole-Trimethoprim   . Penicillins Rash    Current Outpatient Medications on File Prior to Visit  Medication Sig Dispense Refill  .  diclofenac sodium (VOLTAREN) 1 % GEL     . diltiazem (TIAZAC) 300 MG 24 hr capsule Take 1 capsule by mouth once daily for blood pressure. 90 capsule 3  . glimepiride (AMARYL) 2 MG tablet Take 1 tablet by mouth with breakfast for diabetes. 180 tablet 3  . glucose blood (ONE TOUCH ULTRA TEST) test strip USE AS DIRECTED TEST 3 TIMES DAILY E11.9 100 each 2  . metFORMIN (GLUCOPHAGE) 500 MG tablet Take 1 tablet by mouth twice daily with food for diabetes. 180 tablet 3  . metoprolol succinate (TOPROL XL) 25 MG 24 hr tablet Take 1 tablet (25 mg total) by mouth daily. 30 tablet 0  . Vitamin D, Ergocalciferol, (DRISDOL) 50000 units CAPS capsule TAKE 1 CAPSULE BY MOUTH  ONCE EVERY WEEK 12 capsule 0   No current facility-administered medications on file prior to visit.     BP (!) 160/72   Pulse 72   Temp 98.2 F (36.8 C) (Oral)   Ht 5\' 5"  (1.651 m)   Wt 127 lb 4 oz (57.7 kg)   SpO2 97%   BMI 21.18 kg/m    Objective:   Physical Exam  Constitutional: She appears well-nourished.  Neck: Neck supple.  Cardiovascular: Normal rate and regular rhythm.  Respiratory: Effort normal and breath sounds normal.  Skin: Skin is warm and dry.  Psychiatric: She has a normal mood and affect.           Assessment & Plan:

## 2018-06-12 NOTE — Assessment & Plan Note (Signed)
Suspect spike in BP more related to changes from losartan 100 mg to irbesartan 150 mg. Do suspect some anxiety playing a role.  Will increase irbesartan to 300 mg daily. Continue diltiazem and metoprolol succinate for now. She will start recording BP readings and bring them to her next visit. Follow up in 2 weeks for BP check and repeat BMP.

## 2018-06-21 DIAGNOSIS — M5416 Radiculopathy, lumbar region: Secondary | ICD-10-CM | POA: Diagnosis not present

## 2018-06-26 ENCOUNTER — Encounter: Payer: Self-pay | Admitting: Primary Care

## 2018-06-26 ENCOUNTER — Ambulatory Visit (INDEPENDENT_AMBULATORY_CARE_PROVIDER_SITE_OTHER): Payer: Medicare Other | Admitting: Primary Care

## 2018-06-26 VITALS — BP 146/62 | HR 75 | Temp 97.9°F | Ht 65.0 in | Wt 129.8 lb

## 2018-06-26 DIAGNOSIS — E119 Type 2 diabetes mellitus without complications: Secondary | ICD-10-CM | POA: Diagnosis not present

## 2018-06-26 DIAGNOSIS — E559 Vitamin D deficiency, unspecified: Secondary | ICD-10-CM | POA: Diagnosis not present

## 2018-06-26 DIAGNOSIS — I10 Essential (primary) hypertension: Secondary | ICD-10-CM | POA: Diagnosis not present

## 2018-06-26 LAB — BASIC METABOLIC PANEL
BUN: 16 mg/dL (ref 6–23)
CHLORIDE: 102 meq/L (ref 96–112)
CO2: 26 mEq/L (ref 19–32)
CREATININE: 0.92 mg/dL (ref 0.40–1.20)
Calcium: 9.5 mg/dL (ref 8.4–10.5)
GFR: 62.37 mL/min (ref >60.00)
GLUCOSE: 144 mg/dL — AB (ref 70–99)
Potassium: 4.7 mEq/L (ref 3.5–5.1)
SODIUM: 138 meq/L (ref 135–145)

## 2018-06-26 LAB — HEMOGLOBIN A1C: Hgb A1c MFr Bld: 6.8 % — ABNORMAL HIGH (ref 4.6–6.5)

## 2018-06-26 LAB — VITAMIN D 25 HYDROXY (VIT D DEFICIENCY, FRACTURES): VITD: 51.5 ng/mL (ref 30.00–100.00)

## 2018-06-26 MED ORDER — IRBESARTAN 300 MG PO TABS: 300.0000 mg | ORAL_TABLET | Freq: Every day | ORAL | 0 refills | Status: DC

## 2018-06-26 NOTE — Assessment & Plan Note (Signed)
Repeat A1C pending. 

## 2018-06-26 NOTE — Progress Notes (Signed)
Subjective:    Patient ID: Morgan Bowen, female    DOB: 11/20/1935, 82 y.o.   MRN: [id number]  HPI  Morgan Bowen is an 82 year old female who presents today for follow up hypertension.  She was last evaluated on 06/12/18 for ED follow up for uncontrolled hypertension. She endorsed compliance to irbesartan 150 mg and diltiazem ER 300 mg, metoprolol succinate 25 mg was added to her regimen during her ED visit. During her last visit we increased her irbesartan to 300 mg given continued uncontrolled readings. She was asked to follow up today.  BP Readings from Last 3 Encounters:  06/26/18 (!) 146/62  06/12/18 (!) 160/72  06/06/18 (!) 173/70   Since her last visit she's monitoring her BP at home and is getting readings of 140-150/70-80's with heart rate in the 70-80's. She's had no problems since her last visit.   Review of Systems  Respiratory: Negative for shortness of breath.   Cardiovascular: Negative for chest pain.  Neurological: Negative for dizziness and headaches.       Past Medical History:  Diagnosis Date  . Anxiety   . Arthritis   . Chickenpox   . Diabetes mellitus without complication ([place])   . GERD (gastroesophageal reflux disease)   . Hypertension   . Osteoporosis   . Type 2 diabetes mellitus ([place])   . Urinary tract infection      Social History   Socioeconomic History  . Marital status: Widowed    Spouse name: Not on file  . Number of children: Not on file  . Years of education: Not on file  . Highest education level: Not on file  Occupational History  . Not on file  Social Needs  . Financial resource strain: Not on file  . Food insecurity:    Worry: Not on file    Inability: Not on file  . Transportation needs:    Medical: Not on file    Non-medical: Not on file  Tobacco Use  . Smoking status: Never Smoker  . Smokeless tobacco: Never Used  Substance and Sexual Activity  . Alcohol use: No    Frequency: Never  . Drug use: No  . Sexual activity:  Not Currently  Lifestyle  . Physical activity:    Days per week: Not on file    Minutes per session: Not on file  . Stress: Not on file  Relationships  . Social connections:    Talks on phone: Not on file    Gets together: Not on file    Attends religious service: Not on file    Active member of club or organization: Not on file    Attends meetings of clubs or organizations: Not on file    Relationship status: Not on file  . Intimate partner violence:    Fear of current or ex partner: Not on file    Emotionally abused: Not on file    Physically abused: Not on file    Forced sexual activity: Not on file  Other Topics Concern  . Not on file  Social History Narrative   ** Merged History Encounter **       Widowed. Moved from [place]. 1 son, 3 grandchildren. Retired. Once worked as a Agricultural engineer.     Past Surgical History:  Procedure Laterality Date  . ABDOMINAL HYSTERECTOMY  1984  . ABDOMINAL HYSTERECTOMY    . APPENDECTOMY  1958  . CHOLECYSTECTOMY    . GALLBLADDER SURGERY    .  MANDIBLE FRACTURE SURGERY  07/13/2016  . TONSILLECTOMY  1958  . TONSILLECTOMY    . WRIST FRACTURE SURGERY Right    2000    Family History  Problem Relation Age of Onset  . Hypertension Mother   . Stroke Mother   . Stroke Father   . Hypertension Father   . Hypertension Sister   . Kidney disease Sister   . Breast cancer Neg Hx     Allergies  Allergen Reactions  . Sulfamethoxazole-Trimethoprim   . Penicillins Rash    Current Outpatient Medications on File Prior to Visit  Medication Sig Dispense Refill  . diclofenac sodium (VOLTAREN) 1 % GEL     . diltiazem (TIAZAC) 300 MG 24 hr capsule Take 1 capsule by mouth once daily for blood pressure. 90 capsule 3  . glimepiride (AMARYL) 2 MG tablet Take 1 tablet by mouth with breakfast for diabetes. 180 tablet 3  . glucose blood (ONE TOUCH ULTRA TEST) test strip USE AS DIRECTED TEST 3 TIMES DAILY E11.9 100 each 2  . metFORMIN (GLUCOPHAGE)  500 MG tablet Take 1 tablet by mouth twice daily with food for diabetes. 180 tablet 3  . metoprolol succinate (TOPROL XL) 25 MG 24 hr tablet Take 1 tablet (25 mg total) by mouth daily. 30 tablet 0  . Vitamin D, Ergocalciferol, (DRISDOL) 50000 units CAPS capsule TAKE 1 CAPSULE BY MOUTH  ONCE EVERY WEEK 12 capsule 0   No current facility-administered medications on file prior to visit.     BP (!) 146/62   Pulse 75   Temp 97.9 F (36.6 C) (Oral)   Ht 5\' 5"  (1.651 m)   Wt 129 lb 12.8 oz (58.9 kg)   SpO2 95%   BMI 21.60 kg/m    Objective:   Physical Exam  Constitutional: She appears well-nourished.  Neck: Neck supple.  Cardiovascular: Normal rate and regular rhythm.  Respiratory: Effort normal and breath sounds normal.  Skin: Skin is warm and dry.           Assessment & Plan:

## 2018-06-26 NOTE — Patient Instructions (Signed)
Stop by the lab prior to leaving today. I will notify you of your results once received.   Continue taking irbesartan 300 mg, diltiazem ER 300 mg, and metoprolol succinate 25 mg.   Keep an eye on your blood pressure readings.  Schedule a follow up visit in 3 weeks.   It was a pleasure to see you today!

## 2018-06-26 NOTE — Assessment & Plan Note (Signed)
Repeat vitamin D pending. 

## 2018-06-26 NOTE — Assessment & Plan Note (Addendum)
Improved on increased dose of irbesartan 300 mg. Will continue current regimen of diltiazem ER 300 and metoprolol succinate 25 mg for now. Will likely need to avoid HCTZ due to renal function.   Follow up in 3 weeks for BP check. Repeat BMP pending today.

## 2018-06-26 NOTE — Progress Notes (Signed)
Pre visit review using our clinic review tool, if applicable. No additional management support is needed unless otherwise documented below in the visit note. 

## 2018-06-27 DIAGNOSIS — R2681 Unsteadiness on feet: Secondary | ICD-10-CM | POA: Diagnosis not present

## 2018-06-29 DIAGNOSIS — R2681 Unsteadiness on feet: Secondary | ICD-10-CM | POA: Diagnosis not present

## 2018-07-07 ENCOUNTER — Other Ambulatory Visit: Payer: Self-pay | Admitting: Primary Care

## 2018-07-17 ENCOUNTER — Ambulatory Visit (INDEPENDENT_AMBULATORY_CARE_PROVIDER_SITE_OTHER): Payer: Medicare Other | Admitting: Primary Care

## 2018-07-17 ENCOUNTER — Encounter: Payer: Self-pay | Admitting: Primary Care

## 2018-07-17 VITALS — BP 148/70 | HR 86 | Temp 98.0°F | Ht 65.0 in | Wt 128.2 lb

## 2018-07-17 DIAGNOSIS — I10 Essential (primary) hypertension: Secondary | ICD-10-CM | POA: Diagnosis not present

## 2018-07-17 DIAGNOSIS — E119 Type 2 diabetes mellitus without complications: Secondary | ICD-10-CM

## 2018-07-17 MED ORDER — IRBESARTAN 300 MG PO TABS: 300.0000 mg | ORAL_TABLET | Freq: Every day | ORAL | 3 refills | Status: DC

## 2018-07-17 MED ORDER — GLIMEPIRIDE 2 MG PO TABS: ORAL_TABLET | ORAL | 3 refills | Status: DC

## 2018-07-17 MED ORDER — METOPROLOL SUCCINATE ER 25 MG PO TB24: 25.0000 mg | ORAL_TABLET | Freq: Every day | ORAL | 3 refills | Status: DC

## 2018-07-17 NOTE — Assessment & Plan Note (Signed)
Stable in the office, home readings seem all over the place. Continue current regimen.

## 2018-07-17 NOTE — Assessment & Plan Note (Signed)
Patient requesting glimepiride to be switched to [facility]. Refill sent to pharmacy.

## 2018-07-17 NOTE — Patient Instructions (Addendum)
Continue your current blood pressure medications.  Please notify me if you develop fevers, abdominal pain, and/or continue to vomit.   We will see you in February as scheduled.  It was a pleasure to see you today!

## 2018-07-17 NOTE — Progress Notes (Signed)
Subjective:    Patient ID: Morgan Bowen, female    DOB: November 01, 1935, 82 y.o.   MRN: [id number]  HPI  [name] is an 82 year old female who presents today for follow up of hypertension.  She was last evaluated in mid November 2019 with continued borderline elevated readings on irbesartan 300 mg, metoprolol succinate 25 mg, and diltiazem ER 300 mg. Her BP readings had improved overall so we decided to have her return for evaluation.  Since her last visit she's checking her BP at home. Which is running all over the place from 90-180/60's. She has had two episodes of clear vomiting over the last 2 days. Denies abdominal pain, has noticed some dizziness. The vomitus tasted medicinal, thinks it was her glimepiride as her manufacturer changed. She would like to get [facility] from [facility]. Her symptoms improved after taking Pepcid.   BP Readings from Last 3 Encounters:  07/17/18 (!) 148/70  06/26/18 (!) 146/62  06/12/18 (!) 160/72     Review of Systems  Respiratory: Negative for shortness of breath.   Cardiovascular: Negative for chest pain.  Neurological: Negative for headaches.       Slight dizziness        Past Medical History:  Diagnosis Date  . Anxiety   . Arthritis   . Chickenpox   . Diabetes mellitus without complication ([place])   . GERD (gastroesophageal reflux disease)   . Hypertension   . Osteoporosis   . Type 2 diabetes mellitus ([place])   . Urinary tract infection      Social History   Socioeconomic History  . Marital status: Widowed    Spouse name: Not on file  . Number of children: Not on file  . Years of education: Not on file  . Highest education level: Not on file  Occupational History  . Not on file  Social Needs  . Financial resource strain: Not on file  . Food insecurity:    Worry: Not on file    Inability: Not on file  . Transportation needs:    Medical: Not on file    Non-medical: Not on file  Tobacco Use  . Smoking status: Never Smoker  .  Smokeless tobacco: Never Used  Substance and Sexual Activity  . Alcohol use: No    Frequency: Never  . Drug use: No  . Sexual activity: Not Currently  Lifestyle  . Physical activity:    Days per week: Not on file    Minutes per session: Not on file  . Stress: Not on file  Relationships  . Social connections:    Talks on phone: Not on file    Gets together: Not on file    Attends religious service: Not on file    Active member of club or organization: Not on file    Attends meetings of clubs or organizations: Not on file    Relationship status: Not on file  . Intimate partner violence:    Fear of current or ex partner: Not on file    Emotionally abused: Not on file    Physically abused: Not on file    Forced sexual activity: Not on file  Other Topics Concern  . Not on file  Social History Narrative   ** Merged History Encounter **       Widowed. Moved from [place]. 1 son, 3 grandchildren. Retired. Once worked as a Agricultural engineer.     Past Surgical History:  Procedure Laterality Date  .  ABDOMINAL HYSTERECTOMY  1984  . ABDOMINAL HYSTERECTOMY    . APPENDECTOMY  1958  . CHOLECYSTECTOMY    . GALLBLADDER SURGERY    . MANDIBLE FRACTURE SURGERY  07/13/2016  . TONSILLECTOMY  1958  . TONSILLECTOMY    . WRIST FRACTURE SURGERY Right    2000    Family History  Problem Relation Age of Onset  . Hypertension Mother   . Stroke Mother   . Stroke Father   . Hypertension Father   . Hypertension Sister   . Kidney disease Sister   . Breast cancer Neg Hx     Allergies  Allergen Reactions  . Sulfamethoxazole-Trimethoprim   . Penicillins Rash    Current Outpatient Medications on File Prior to Visit  Medication Sig Dispense Refill  . diclofenac sodium (VOLTAREN) 1 % GEL     . diltiazem (TIAZAC) 300 MG 24 hr capsule Take 1 capsule by mouth once daily for blood pressure. 90 capsule 3  . glucose blood (ONE TOUCH ULTRA TEST) test strip USE AS DIRECTED TEST 3 TIMES DAILY E11.9  100 each 2  . metFORMIN (GLUCOPHAGE) 500 MG tablet Take 1 tablet by mouth twice daily with food for diabetes. 180 tablet 3  . Vitamin D, Ergocalciferol, (DRISDOL) 50000 units CAPS capsule TAKE 1 CAPSULE BY MOUTH  ONCE EVERY WEEK 12 capsule 0   No current facility-administered medications on file prior to visit.     BP (!) 148/70   Pulse 86   Temp 98 F (36.7 C) (Oral)   Ht 5\' 5"  (1.651 m)   Wt 128 lb 4 oz (58.2 kg)   SpO2 97%   BMI 21.34 kg/m    Objective:   Physical Exam  Constitutional: She appears well-nourished.  Neck: Neck supple.  Cardiovascular: Normal rate and regular rhythm.  Respiratory: Effort normal and breath sounds normal.  Skin: Skin is warm and dry.  Psychiatric: She has a normal mood and affect.           Assessment & Plan:

## 2018-07-20 ENCOUNTER — Ambulatory Visit (INDEPENDENT_AMBULATORY_CARE_PROVIDER_SITE_OTHER): Payer: Medicare Other | Admitting: Podiatry

## 2018-07-20 ENCOUNTER — Encounter: Payer: Self-pay | Admitting: Podiatry

## 2018-07-20 DIAGNOSIS — B351 Tinea unguium: Secondary | ICD-10-CM | POA: Diagnosis not present

## 2018-07-20 DIAGNOSIS — M79675 Pain in left toe(s): Secondary | ICD-10-CM

## 2018-07-20 DIAGNOSIS — E119 Type 2 diabetes mellitus without complications: Secondary | ICD-10-CM

## 2018-07-20 DIAGNOSIS — M79674 Pain in right toe(s): Secondary | ICD-10-CM | POA: Diagnosis not present

## 2018-07-20 NOTE — Progress Notes (Signed)
Complaint:  Visit Type: Patient returns to my office for continued preventative foot care services. Complaint: Patient states" my nails have grown long and thick and become painful to walk and wear shoes" Patient has been diagnosed with DM with no foot complications. The patient presents for preventative foot care services. No changes to ROS  Podiatric Exam: Vascular: dorsalis pedis and posterior tibial pulses are palpable bilateral. Capillary return is immediate. Temperature gradient is WNL. Skin turgor WNL  Sensorium: Normal Semmes Weinstein monofilament test. Normal tactile sensation bilaterally. Nail Exam: Pt has thick disfigured discolored nails with subungual debris noted bilateral entire nail hallux  Ulcer Exam: There is no evidence of ulcer or pre-ulcerative changes or infection. Orthopedic Exam: Muscle tone and strength are WNL. No limitations in general ROM. No crepitus or effusions noted. Foot type and digits show no abnormalities. Bony prominences are unremarkable. Skin: No Porokeratosis. No infection or ulcers  Diagnosis:  Onychomycosis, , Pain in right toe, pain in left toes  Treatment & Plan Procedures and Treatment: Consent by patient was obtained for treatment procedures.   Debridement of mycotic and hypertrophic toenails, 1 through 5 bilateral and clearing of subungual debris. No ulceration, no infection noted.  Return Visit-Office Procedure: Patient instructed to return to the office for a follow up visit 3 months for continued evaluation and treatment.    Remy Dia DPM 

## 2018-07-25 DIAGNOSIS — E119 Type 2 diabetes mellitus without complications: Secondary | ICD-10-CM | POA: Diagnosis not present

## 2018-07-25 DIAGNOSIS — H2513 Age-related nuclear cataract, bilateral: Secondary | ICD-10-CM | POA: Diagnosis not present

## 2018-08-20 ENCOUNTER — Other Ambulatory Visit: Payer: Self-pay | Admitting: Primary Care

## 2018-08-20 DIAGNOSIS — I10 Essential (primary) hypertension: Secondary | ICD-10-CM

## 2018-09-21 ENCOUNTER — Ambulatory Visit: Payer: Medicare Other | Admitting: Primary Care

## 2018-10-03 ENCOUNTER — Other Ambulatory Visit: Payer: Self-pay | Admitting: Primary Care

## 2018-10-03 DIAGNOSIS — M810 Age-related osteoporosis without current pathological fracture: Secondary | ICD-10-CM

## 2018-10-04 NOTE — Telephone Encounter (Signed)
Last prescribed on 06/07/2018 . Last office visit on 07/17/2018. Next future appointment on 11/20/2018

## 2018-10-04 NOTE — Telephone Encounter (Signed)
Noted.  Refill sent pharmacy. 

## 2018-10-11 DIAGNOSIS — M25569 Pain in unspecified knee: Secondary | ICD-10-CM | POA: Diagnosis not present

## 2018-10-11 DIAGNOSIS — M25561 Pain in right knee: Secondary | ICD-10-CM | POA: Diagnosis not present

## 2018-10-13 DIAGNOSIS — M25569 Pain in unspecified knee: Secondary | ICD-10-CM | POA: Diagnosis not present

## 2018-10-18 DIAGNOSIS — M25561 Pain in right knee: Secondary | ICD-10-CM | POA: Diagnosis not present

## 2018-10-20 DIAGNOSIS — M25561 Pain in right knee: Secondary | ICD-10-CM | POA: Diagnosis not present

## 2018-10-23 ENCOUNTER — Encounter: Payer: Self-pay | Admitting: Podiatry

## 2018-10-23 ENCOUNTER — Other Ambulatory Visit: Payer: Self-pay

## 2018-10-23 ENCOUNTER — Ambulatory Visit (INDEPENDENT_AMBULATORY_CARE_PROVIDER_SITE_OTHER): Payer: Medicare Other | Admitting: Podiatry

## 2018-10-23 DIAGNOSIS — M79675 Pain in left toe(s): Secondary | ICD-10-CM | POA: Diagnosis not present

## 2018-10-23 DIAGNOSIS — B351 Tinea unguium: Secondary | ICD-10-CM

## 2018-10-23 DIAGNOSIS — E119 Type 2 diabetes mellitus without complications: Secondary | ICD-10-CM

## 2018-10-23 DIAGNOSIS — M79674 Pain in right toe(s): Secondary | ICD-10-CM

## 2018-10-23 NOTE — Progress Notes (Signed)
Complaint:  Visit Type: Patient returns to my office for continued preventative foot care services. Complaint: Patient states" my nails have grown long and thick and become painful to walk and wear shoes" Patient has been diagnosed with DM with no foot complications. The patient presents for preventative foot care services. No changes to ROS  Podiatric Exam: Vascular: dorsalis pedis and posterior tibial pulses are palpable bilateral. Capillary return is immediate. Temperature gradient is WNL. Skin turgor WNL  Sensorium: Normal Semmes Weinstein monofilament test. Normal tactile sensation bilaterally. Nail Exam: Pt has thick disfigured discolored nails with subungual debris noted bilateral entire nail hallux  Ulcer Exam: There is no evidence of ulcer or pre-ulcerative changes or infection. Orthopedic Exam: Muscle tone and strength are WNL. No limitations in general ROM. No crepitus or effusions noted. Foot type and digits show no abnormalities. Bony prominences are unremarkable. Skin: No Porokeratosis. No infection or ulcers  Diagnosis:  Onychomycosis, , Pain in right toe, pain in left toes  Treatment & Plan Procedures and Treatment: Consent by patient was obtained for treatment procedures.   Debridement of mycotic and hypertrophic toenails, 1 through 5 bilateral and clearing of subungual debris. No ulceration, no infection noted.  Return Visit-Office Procedure: Patient instructed to return to the office for a follow up visit 3 months for continued evaluation and treatment.    Markeis Allman DPM 

## 2018-10-24 DIAGNOSIS — M25561 Pain in right knee: Secondary | ICD-10-CM | POA: Diagnosis not present

## 2018-11-15 ENCOUNTER — Telehealth: Payer: Self-pay

## 2018-11-15 ENCOUNTER — Encounter: Payer: Self-pay | Admitting: Primary Care

## 2018-11-15 ENCOUNTER — Ambulatory Visit: Payer: Medicare Other

## 2018-11-15 ENCOUNTER — Other Ambulatory Visit: Payer: Self-pay

## 2018-11-15 ENCOUNTER — Ambulatory Visit (INDEPENDENT_AMBULATORY_CARE_PROVIDER_SITE_OTHER): Payer: Medicare Other | Admitting: Primary Care

## 2018-11-15 DIAGNOSIS — I10 Essential (primary) hypertension: Secondary | ICD-10-CM | POA: Diagnosis not present

## 2018-11-15 NOTE — Assessment & Plan Note (Signed)
BP check in our office and also recent BP check at patient's home are within her goal range.  Suspect her erratic BP reading was an error as she is asymptomatic, appears well, and has stable readings now. Continue current regimen.  She will continue to monitor BP and notify if readings are at or above 150/90.

## 2018-11-15 NOTE — Progress Notes (Signed)
Subjective:    Patient ID: Morgan Bowen, female    DOB: Aug 24, 1935, 83 y.o.   MRN: [id number]  HPI  Virtual Visit via Video Note  I connected with Morgan Bowen on 11/15/18 at 11:00 AM EDT by a video enabled telemedicine application and verified that I am speaking with the correct person using two identifiers.   I discussed the limitations of evaluation and management by telemedicine and the availability of in person appointments. The patient expressed understanding and agreed to proceed.  She is at home, I am in the office.  History of Present Illness:  Ms. Ketner is an 83 year old female with a history of hypertension, type 2 diabetes who presents today via video with a chief complaint of elevated blood pressure readings.  She is currently managed on irbesartan 300 mg, however it appears that her irbesartan 150 mg tablets were refilled for 58-month supply in January 2020.  She is also managed on diltiazem 300 mg daily, metoprolol succinate 25 mg.  She endorses that her home blood pressure readings are ranging from 140s to 180s/80's-90's just yesterday.   She checked her BP at 10:30 am this morning which was 145/68. She confirms today that she is taking Irbesartan 300 mg and diltiazem 300 mg.   BP Readings from Last 3 Encounters:  11/15/18 (!) 145/68  07/17/18 (!) 148/70  06/26/18 (!) 146/62      Observations/Objective:  Alert and oriented. Speaking in complete sentences. No distress.  Assessment and Plan:  BP check in our office and also recent BP check at patient's home are within her goal range.  Suspect her erratic BP reading was an error as she is asymptomatic, appears well, and has stable readings now. Continue current regimen.  She will continue to monitor BP and notify if readings are at or above 150/90.  Follow Up Instructions:  Continue irbesartan 300 mg, metoprolol succinate 25 mg, and diltiazem 300 mg for blood pressure.  Start monitoring your blood pressure  daily, around the same time of day, for the next one week.  Ensure that you have rested for 30 minutes prior to checking your blood pressure. Record your readings and notify me if you see readings at or above 150/90.  It was nice to see you! Allie Bossier, NP-C    I discussed the assessment and treatment plan with the patient. The patient was provided an opportunity to ask questions and all were answered. The patient agreed with the plan and demonstrated an understanding of the instructions.   The patient was advised to call back or seek an in-person evaluation if the symptoms worsen or if the condition fails to improve as anticipated.     [name], NP    Review of Systems  Respiratory: Negative for shortness of breath.   Cardiovascular: Negative for chest pain.  Neurological: Negative for dizziness and headaches.       Past Medical History:  Diagnosis Date  . Anxiety   . Arthritis   . Chickenpox   . Diabetes mellitus without complication ([place])   . GERD (gastroesophageal reflux disease)   . Hypertension   . Osteoporosis   . Type 2 diabetes mellitus ([place])   . Urinary tract infection      Social History   Socioeconomic History  . Marital status: Widowed    Spouse name: Not on file  . Number of children: Not on file  . Years of education: Not on file  . Highest education level:  Not on file  Occupational History  . Not on file  Social Needs  . Financial resource strain: Not on file  . Food insecurity:    Worry: Not on file    Inability: Not on file  . Transportation needs:    Medical: Not on file    Non-medical: Not on file  Tobacco Use  . Smoking status: Never Smoker  . Smokeless tobacco: Never Used  Substance and Sexual Activity  . Alcohol use: No    Frequency: Never  . Drug use: No  . Sexual activity: Not Currently  Lifestyle  . Physical activity:    Days per week: Not on file    Minutes per session: Not on file  . Stress: Not on file   Relationships  . Social connections:    Talks on phone: Not on file    Gets together: Not on file    Attends religious service: Not on file    Active member of club or organization: Not on file    Attends meetings of clubs or organizations: Not on file    Relationship status: Not on file  . Intimate partner violence:    Fear of current or ex partner: Not on file    Emotionally abused: Not on file    Physically abused: Not on file    Forced sexual activity: Not on file  Other Topics Concern  . Not on file  Social History Narrative   ** Merged History Encounter **       Widowed. Moved from [place]. 1 son, 3 grandchildren. Retired. Once worked as a Agricultural engineer.     Past Surgical History:  Procedure Laterality Date  . ABDOMINAL HYSTERECTOMY  1984  . ABDOMINAL HYSTERECTOMY    . APPENDECTOMY  1958  . CHOLECYSTECTOMY    . GALLBLADDER SURGERY    . MANDIBLE FRACTURE SURGERY  07/13/2016  . TONSILLECTOMY  1958  . TONSILLECTOMY    . WRIST FRACTURE SURGERY Right    2000    Family History  Problem Relation Age of Onset  . Hypertension Mother   . Stroke Mother   . Stroke Father   . Hypertension Father   . Hypertension Sister   . Kidney disease Sister   . Breast cancer Neg Hx     Allergies  Allergen Reactions  . Sulfamethoxazole-Trimethoprim   . Penicillins Rash    Current Outpatient Medications on File Prior to Visit  Medication Sig Dispense Refill  . diclofenac sodium (VOLTAREN) 1 % GEL     . diltiazem (TIAZAC) 300 MG 24 hr capsule Take 1 capsule by mouth once daily for blood pressure. 90 capsule 3  . glimepiride (AMARYL) 2 MG tablet Take 1 tablet by mouth with breakfast for diabetes. 180 tablet 3  . glucose blood (ONE TOUCH ULTRA TEST) test strip USE AS DIRECTED TEST 3 TIMES DAILY E11.9 100 each 2  . irbesartan (AVAPRO) 300 MG tablet Take 1 tablet (300 mg total) by mouth daily. 90 tablet 3  . metFORMIN (GLUCOPHAGE) 500 MG tablet Take 1 tablet by mouth twice  daily with food for diabetes. 180 tablet 3  . metoprolol succinate (TOPROL-XL) 25 MG 24 hr tablet Take 1 tablet (25 mg total) by mouth daily. For blood pressure. 90 tablet 3  . Vitamin D, Ergocalciferol, (DRISDOL) 1.25 MG (50000 UT) CAPS capsule TAKE ONE CAPSULE BY MOUTH ONE TIME PER WEEK 12 capsule 3   No current facility-administered medications on file prior to visit.  BP (!) 145/68    Objective:   Physical Exam  Constitutional: She is oriented to person, place, and time. She appears well-nourished.  Respiratory: Effort normal.  Neurological: She is alert and oriented to person, place, and time.  Psychiatric: She has a normal mood and affect.           Assessment & Plan:

## 2018-11-15 NOTE — Patient Instructions (Signed)
Continue irbesartan 300 mg, metoprolol succinate 25 mg, and diltiazem 300 mg for blood pressure.  Start monitoring your blood pressure daily, around the same time of day, for the next one week.  Ensure that you have rested for 30 minutes prior to checking your blood pressure. Record your readings and notify me if you see readings at or above 150/90.  It was nice to see you! Allie Bossier, NP-C

## 2018-11-15 NOTE — Telephone Encounter (Signed)
Issue addressed via virtual video appt with PCP on 11/15/18.

## 2018-11-15 NOTE — Telephone Encounter (Signed)
[facility] Patient Name: Morgan Bowen Gender: Female DOB: 08-26-1935 Age: 83 Y 2 M 2 D Return Phone Number: [phone] (Primary), [phone] (Secondary) Address: [address], [address] City/State/Zip: [place] [address] Client [facility] Night - Client Client Site [place] Physician [name] - NP Contact Type Call Who Is Calling Patient / Member / Family / Caregiver Call Type Triage / Clinical Relationship To Patient Self Return Phone Number [phone] (Secondary) Chief Complaint Blood Pressure High Reason for Call Symptomatic / Request for [place] states her BP is erratic and so is pulse. 141/56/89 earlier. 173/70/91. 183/84/98. Most recent Translation No Nurse Assessment Nurse: Quay Burow, RN, Rodena Piety Date/Time (Eastern Time): 11/14/2018 6:28:35 PM Confirm and document reason for call. If symptomatic, describe symptoms. ---Caller states her BP and pulse has been up and down today. Earlier today her BP 173/70 pulse 91. Currently her BP is 183/84 pulse 98. Has the patient had close contact with a person known or suspected to have the novel coronavirus illness OR traveled / lives in area with major community spread (including international travel) in the last 14 days from the onset of symptoms? * If Asymptomatic, screen for exposure and travel within the last 14 days. ---No Does the patient have any new or worsening symptoms? ---Yes Will a triage be completed? ---Yes Related visit to physician within the last 2 weeks? ---No Does the PT have any chronic conditions? (i.e. diabetes, asthma, this includes High risk factors for pregnancy, etc.) ---Yes List chronic conditions. ---HTN, DM Is this a behavioral health or substance abuse call? ---No Guidelines Guideline Title Affirmed  Question Affirmed Notes Nurse Date/Time (Eastern Time) High Blood Pressure Systolic BP >= 622 OR Diastolic >= 633 Burns, RN, Rodena Piety 11/14/2018 6:33:55 PM Disp. Time Eilene Ghazi Time) Disposition Final User 11/14/2018 6:38:22 PM See PCP within 24 Hours Yes Burns, RN, Rodena Piety PLEASE NOTE: All timestamps contained within this report are represented as [place] Standard Time. CONFIDENTIALTY NOTICE: This fax transmission is intended only for the addressee. It contains information that is legally privileged, confidential or otherwise protected from use or disclosure. If you are not the intended recipient, you are strictly prohibited from reviewing, disclosing, copying using or disseminating any of this information or taking any action in reliance on or regarding this information. If you have received this fax in error, please notify us immediately by telephone so that we can arrange for its return to [place]. Phone: [phone], Toll-Free: [phone], Fax: [phone] Page: 2 of 2 Call Id: [id number] [place] Disagree/Comply Comply Caller Understands Yes PreDisposition Go to ED Care Advice Given Per Guideline SEE PCP WITHIN 24 HOURS: * IF OFFICE WILL BE OPEN: You need to be seen within the next 24 hours. Call your doctor (or NP/PA) when the office opens and make an appointment. HIGH BLOOD PRESSURE: * Untreated high blood pressure may cause damage to your heart, brain, kidneys, and eyes. CALL BACK IF: * Weakness or numbness of the face, arm or leg on one side of the body occurs * Difficulty walking, difficulty talking, or severe headache occurs * Chest pain or difficulty breathing occurs * You become worse. CARE ADVICE given per High Blood Pressure (Adult) guideline. Referrals REFERRED TO PCP OFFICE

## 2018-11-20 ENCOUNTER — Ambulatory Visit: Payer: Medicare Other | Admitting: Primary Care

## 2018-11-22 DIAGNOSIS — M170 Bilateral primary osteoarthritis of knee: Secondary | ICD-10-CM | POA: Diagnosis not present

## 2018-11-22 DIAGNOSIS — M5136 Other intervertebral disc degeneration, lumbar region: Secondary | ICD-10-CM | POA: Diagnosis not present

## 2018-12-13 ENCOUNTER — Encounter: Payer: Self-pay | Admitting: Primary Care

## 2018-12-13 ENCOUNTER — Other Ambulatory Visit: Payer: Self-pay

## 2018-12-13 ENCOUNTER — Ambulatory Visit (INDEPENDENT_AMBULATORY_CARE_PROVIDER_SITE_OTHER): Payer: Medicare Other | Admitting: Primary Care

## 2018-12-13 DIAGNOSIS — L0390 Cellulitis, unspecified: Secondary | ICD-10-CM

## 2018-12-13 DIAGNOSIS — L03116 Cellulitis of left lower limb: Secondary | ICD-10-CM

## 2018-12-13 HISTORY — DX: Cellulitis, unspecified: L03.90

## 2018-12-13 MED ORDER — CEPHALEXIN 500 MG PO CAPS: 500.0000 mg | ORAL_CAPSULE | Freq: Two times a day (BID) | ORAL | 0 refills | Status: AC

## 2018-12-13 NOTE — Patient Instructions (Signed)
Start Cephalexin antibiotics for the infection. Take 1 capsule by mouth twice daily for 7 days.  Please call me if you develop swelling and pain to the calf of either side.  Please notify me if you don't see improvement within 3-4 days.  It was a pleasure to see you today!

## 2018-12-13 NOTE — Assessment & Plan Note (Signed)
Area of concern is suspicious for early cellulitis.  She does appear stable in the office today. Low suspicion for DVT.  Treat with Keflex course. She has taken before and tolerated well. Rx sent to pharmacy.  Return precautions provided.

## 2018-12-13 NOTE — Progress Notes (Signed)
Subjective:    Patient ID: Morgan Bowen, female    DOB: 17-Mar-1936, 83 y.o.   MRN: [id number]  HPI  Morgan Bowen is an 83 year old female with a history of hypertension, diabetes, decreased renal function who presents today with a chief complaint of ankle and foot edema.  Her edema is located to the enitre left ankle and dorsal foot that she first noticed 4-5 days ago. She is active daily with walking, doesn't believe she's walked any longer than usual. She endorses pain to the posterior ankle and calf intermittently. This morning she woke up and noticed a moderate amount of erythema to the medial left lower extremity proximal to ankle.  She denies any known insect bites, trauma, fevers, shortness of breath, prolonged periods of inactivity. She did "nick" her skin to the left anterior lower chin about one week ago.   Review of Systems  Constitutional: Negative for fever.  Respiratory: Negative for shortness of breath.   Cardiovascular: Positive for leg swelling.  Skin: Positive for color change.       Past Medical History:  Diagnosis Date  . Anxiety   . Arthritis   . Chickenpox   . Diabetes mellitus without complication ([place])   . GERD (gastroesophageal reflux disease)   . Hypertension   . Osteoporosis   . Type 2 diabetes mellitus ([place])   . Urinary tract infection      Social History   Socioeconomic History  . Marital status: Widowed    Spouse name: Not on file  . Number of children: Not on file  . Years of education: Not on file  . Highest education level: Not on file  Occupational History  . Not on file  Social Needs  . Financial resource strain: Not on file  . Food insecurity:    Worry: Not on file    Inability: Not on file  . Transportation needs:    Medical: Not on file    Non-medical: Not on file  Tobacco Use  . Smoking status: Never Smoker  . Smokeless tobacco: Never Used  Substance and Sexual Activity  . Alcohol use: No    Frequency: Never  . Drug use: No   . Sexual activity: Not Currently  Lifestyle  . Physical activity:    Days per week: Not on file    Minutes per session: Not on file  . Stress: Not on file  Relationships  . Social connections:    Talks on phone: Not on file    Gets together: Not on file    Attends religious service: Not on file    Active member of club or organization: Not on file    Attends meetings of clubs or organizations: Not on file    Relationship status: Not on file  . Intimate partner violence:    Fear of current or ex partner: Not on file    Emotionally abused: Not on file    Physically abused: Not on file    Forced sexual activity: Not on file  Other Topics Concern  . Not on file  Social History Narrative   ** Merged History Encounter **       Widowed. Moved from [place]. 1 son, 3 grandchildren. Retired. Once worked as a Agricultural engineer.     Past Surgical History:  Procedure Laterality Date  . ABDOMINAL HYSTERECTOMY  1984  . ABDOMINAL HYSTERECTOMY    . APPENDECTOMY  1958  . CHOLECYSTECTOMY    . GALLBLADDER SURGERY    .  MANDIBLE FRACTURE SURGERY  07/13/2016  . TONSILLECTOMY  1958  . TONSILLECTOMY    . WRIST FRACTURE SURGERY Right    2000    Family History  Problem Relation Age of Onset  . Hypertension Mother   . Stroke Mother   . Stroke Father   . Hypertension Father   . Hypertension Sister   . Kidney disease Sister   . Breast cancer Neg Hx     Allergies  Allergen Reactions  . Sulfamethoxazole-Trimethoprim   . Penicillins Rash    Current Outpatient Medications on File Prior to Visit  Medication Sig Dispense Refill  . diclofenac sodium (VOLTAREN) 1 % GEL     . diltiazem (TIAZAC) 300 MG 24 hr capsule Take 1 capsule by mouth once daily for blood pressure. 90 capsule 3  . glimepiride (AMARYL) 2 MG tablet Take 1 tablet by mouth with breakfast for diabetes. 180 tablet 3  . glucose blood (ONE TOUCH ULTRA TEST) test strip USE AS DIRECTED TEST 3 TIMES DAILY E11.9 100 each 2  .  irbesartan (AVAPRO) 300 MG tablet Take 1 tablet (300 mg total) by mouth daily. 90 tablet 3  . metFORMIN (GLUCOPHAGE) 500 MG tablet Take 1 tablet by mouth twice daily with food for diabetes. 180 tablet 3  . metoprolol succinate (TOPROL-XL) 25 MG 24 hr tablet Take 1 tablet (25 mg total) by mouth daily. For blood pressure. 90 tablet 3  . Vitamin D, Ergocalciferol, (DRISDOL) 1.25 MG (50000 UT) CAPS capsule TAKE ONE CAPSULE BY MOUTH ONE TIME PER WEEK 12 capsule 3   No current facility-administered medications on file prior to visit.     BP (!) 144/78   Pulse 73   Temp 98.2 F (36.8 C) (Oral)   Ht 5\' 5"  (1.651 m)   Wt 129 lb 8 oz (58.7 kg)   SpO2 98%   BMI 21.55 kg/m    Objective:   Physical Exam  Constitutional: She is oriented to person, place, and time. She appears well-nourished.  Cardiovascular:  Bilateral calves measuring 10 inches in circumference.   Respiratory: Effort normal.  Neurological: She is alert and oriented to person, place, and time.  Skin: Skin is warm and dry. No rash noted. There is erythema.  Moderate erythema noted to a moderate portion of the left medial lower extremity proximal to ankle. Non tender. Scabbing noted to left anterior lower chin without surrounding erythema. Mild left ankle edema.            Assessment & Plan:

## 2018-12-18 ENCOUNTER — Ambulatory Visit: Payer: Medicare Other | Admitting: Primary Care

## 2018-12-28 ENCOUNTER — Ambulatory Visit (INDEPENDENT_AMBULATORY_CARE_PROVIDER_SITE_OTHER): Payer: Medicare Other

## 2018-12-28 ENCOUNTER — Other Ambulatory Visit: Payer: Self-pay

## 2018-12-28 ENCOUNTER — Ambulatory Visit (INDEPENDENT_AMBULATORY_CARE_PROVIDER_SITE_OTHER): Payer: Medicare Other | Admitting: Podiatry

## 2018-12-28 VITALS — Temp 99.1°F

## 2018-12-28 DIAGNOSIS — M129 Arthropathy, unspecified: Secondary | ICD-10-CM

## 2018-12-28 NOTE — Progress Notes (Signed)
Complaint:  Visit Type: Patient presents to the office with pain noted on the top of her left midfoot.  Patient states that he that she purchased new shoes a week and a half ago and started to develop occasional pain and swelling on the top of her left foot.  She says it is occasionally painful to walk.  She states that she had a lesion on her left leg which her medical doctor treated with cephalexin and has resolved.  She presents the office today concerned that she may have developed a blood clot on her foot.  This patient is diabetic.  Podiatric Exam: Vascular: dorsalis pedis and posterior tibial pulses are palpable bilateral. Capillary return is immediate. Temperature gradient is WNL. Skin turgor WNL  Sensorium: Normal Semmes Weinstein monofilament test. Normal tactile sensation bilaterally. Nail Exam: Pt has thick disfigured discolored nails with subungual debris noted bilateral entire nail hallux Ulcer Exam: There is no evidence of ulcer or pre-ulcerative changes or infection. Orthopedic Exam: Muscle tone and strength are WNL. No limitations in general ROM. No crepitus or effusions noted. Foot type and digits show no abnormalities.  Dorsal midfoot arthritic changes noted on the left greater than the right foot.  The left foot does reveal minimal swelling.  No palpable pain noted upon examination. Skin: No Porokeratosis. No infection or ulcers  Diagnosis: Midfoot arthritis  B/L  Treatment & Plan  ROV.  X-rays reveal dorsal arthritic changes left foot.  Discussed this condition with this patient.  Told her this was not an acute blood clot in her foot which she was relieved.  She does have midfoot arthritis with swelling in her left foot.  Told her she would benefit from power step insoles which would help stabilize her foot.  Patient was told to take Advil as needed.  Patient to return to the office for her preventative foot care services treatment in 6 weeks.     Gardiner Barefoot DPM

## 2019-01-16 ENCOUNTER — Telehealth: Payer: Self-pay

## 2019-01-16 NOTE — Telephone Encounter (Signed)
[facility] Night - Client Nonclinical Telephone Record AccessNurse Client [facility] Night - Client Client Site  - Night Phys[facility] - PHYSICIAN, Verita Schneiders- MD Contact Type Call Who Is Calling Patient / Member / Family / Caregiver Caller Name Kenlie Seki Phone Number [phone] Patient Name Morgan Bowen Patient DOB 05-Apr-1936 Call Type Message Only Information Provided Reason for Call Request for General Office Information Initial Comment When is callers appt? thought it was in august but was told its tomorrow. Additional Comment Call Closed By: Felecia Jan Transaction Date/Time: 01/16/2019 7:45:16 AM (ET)

## 2019-01-16 NOTE — Telephone Encounter (Signed)
Called pt and left her voicemail regarding her appointment dates and times. Let her know if she has any questions she can call back.

## 2019-01-18 ENCOUNTER — Encounter: Payer: Self-pay | Admitting: Primary Care

## 2019-01-18 ENCOUNTER — Other Ambulatory Visit: Payer: Self-pay

## 2019-01-18 ENCOUNTER — Ambulatory Visit (INDEPENDENT_AMBULATORY_CARE_PROVIDER_SITE_OTHER): Payer: Medicare Other | Admitting: Primary Care

## 2019-01-18 DIAGNOSIS — E119 Type 2 diabetes mellitus without complications: Secondary | ICD-10-CM

## 2019-01-18 DIAGNOSIS — I10 Essential (primary) hypertension: Secondary | ICD-10-CM | POA: Diagnosis not present

## 2019-01-18 MED ORDER — GLUCOSE BLOOD VI STRP: ORAL_STRIP | 2 refills | Status: DC

## 2019-01-18 NOTE — Assessment & Plan Note (Signed)
A1C due in August 2020. Discussed to start monitoring her glucose and pay attention to readings. Discussed to call me if she sees readings at or above 150 on a consistent basis.  Foot and eye exams UTD. Pneumonia vaccination UTD. Managed on ARB. Lipid panel due in August.

## 2019-01-18 NOTE — Progress Notes (Signed)
Subjective:    Patient ID: Morgan Bowen, female    DOB: 1935/11/06, 83 y.o.   MRN: [id number]  HPI  Morgan Bowen is an 83 year old female with a history of type 2 diabetes, hypertension, anxiety and depression who presents today for follow up of blood pressure and diabetes.  1) Type 2 Diabetes:   Current medications include: Glimepiride 2 mg daily, Metformin 500 mg BID.  She is checking her blood glucose one time daily and is getting readings ranging 100-140's.   Last A1C: 6.8 in November 2019 Last Eye Exam: Completed in December 2019 Last Foot Exam: Due in March 2021 Pneumonia Vaccination: Completed in 2019 ACE/ARB: ARB Statin: None  Diet currently consists of:  Breakfast: Toast Lunch: Sandwich or salad Dinner: Vegetables, starch, occasional meat Snacks: Peanut butter crackers Desserts: 2-3 times weekly  Beverages: Water, occasional soda, sweet tea, coffee  Exercise: She is walking 5-7 days weekly  2) Essential Hypertension: Currently managed on Irbesartan 300 mg, diltiazem ER 300 mg, metoprolol succinate 25 mg daily.  She is checking her BP at home which will range between 130's-140's/70's. She denies dizziness and chest pain.   BP Readings from Last 3 Encounters:  01/18/19 140/78  12/13/18 (!) 144/78  11/15/18 (!) 145/68      Review of Systems  Respiratory: Negative for shortness of breath.   Cardiovascular: Negative for chest pain.  Neurological: Negative for dizziness and headaches.       Past Medical History:  Diagnosis Date  . Anxiety   . Arthritis   . Chickenpox   . Diabetes mellitus without complication ([place])   . GERD (gastroesophageal reflux disease)   . Hypertension   . Osteoporosis   . Type 2 diabetes mellitus ([place])   . Urinary tract infection      Social History   Socioeconomic History  . Marital status: Widowed    Spouse name: Not on file  . Number of children: Not on file  . Years of education: Not on file  . Highest education level:  Not on file  Occupational History  . Not on file  Social Needs  . Financial resource strain: Not on file  . Food insecurity    Worry: Not on file    Inability: Not on file  . Transportation needs    Medical: Not on file    Non-medical: Not on file  Tobacco Use  . Smoking status: Never Smoker  . Smokeless tobacco: Never Used  Substance and Sexual Activity  . Alcohol use: No    Frequency: Never  . Drug use: No  . Sexual activity: Not Currently  Lifestyle  . Physical activity    Days per week: Not on file    Minutes per session: Not on file  . Stress: Not on file  Relationships  . Social Herbalist on phone: Not on file    Gets together: Not on file    Attends religious service: Not on file    Active member of club or organization: Not on file    Attends meetings of clubs or organizations: Not on file    Relationship status: Not on file  . Intimate partner violence    Fear of current or ex partner: Not on file    Emotionally abused: Not on file    Physically abused: Not on file    Forced sexual activity: Not on file  Other Topics Concern  . Not on file  Social History  Narrative   ** Merged History Encounter **       Widowed. Moved from [place]. 1 son, 3 grandchildren. Retired. Once worked as a Agricultural engineer.     Past Surgical History:  Procedure Laterality Date  . ABDOMINAL HYSTERECTOMY  1984  . ABDOMINAL HYSTERECTOMY    . APPENDECTOMY  1958  . CHOLECYSTECTOMY    . GALLBLADDER SURGERY    . MANDIBLE FRACTURE SURGERY  07/13/2016  . TONSILLECTOMY  1958  . TONSILLECTOMY    . WRIST FRACTURE SURGERY Right    2000    Family History  Problem Relation Age of Onset  . Hypertension Mother   . Stroke Mother   . Stroke Father   . Hypertension Father   . Hypertension Sister   . Kidney disease Sister   . Breast cancer Neg Hx     Allergies  Allergen Reactions  . Sulfamethoxazole-Trimethoprim   . Penicillins Rash    Current Outpatient  Medications on File Prior to Visit  Medication Sig Dispense Refill  . diclofenac sodium (VOLTAREN) 1 % GEL     . diltiazem (TIAZAC) 300 MG 24 hr capsule Take 1 capsule by mouth once daily for blood pressure. 90 capsule 3  . glimepiride (AMARYL) 2 MG tablet Take 1 tablet by mouth with breakfast for diabetes. 180 tablet 3  . glucose blood (ONE TOUCH ULTRA TEST) test strip USE AS DIRECTED TEST 3 TIMES DAILY E11.9 100 each 2  . irbesartan (AVAPRO) 300 MG tablet Take 1 tablet (300 mg total) by mouth daily. 90 tablet 3  . metFORMIN (GLUCOPHAGE) 500 MG tablet Take 1 tablet by mouth twice daily with food for diabetes. 180 tablet 3  . metoprolol succinate (TOPROL-XL) 25 MG 24 hr tablet Take 1 tablet (25 mg total) by mouth daily. For blood pressure. 90 tablet 3  . Vitamin D, Ergocalciferol, (DRISDOL) 1.25 MG (50000 UT) CAPS capsule TAKE ONE CAPSULE BY MOUTH ONE TIME PER WEEK 12 capsule 3   No current facility-administered medications on file prior to visit.     BP 140/78   Pulse 79   Temp 98 F (36.7 C) (Tympanic)   Ht 5\' 5"  (1.651 m)   Wt 128 lb (58.1 kg)   SpO2 97%   BMI 21.30 kg/m    Objective:   Physical Exam  Constitutional: She appears well-nourished.  Neck: Neck supple.  Cardiovascular: Normal rate and regular rhythm.  Respiratory: Effort normal and breath sounds normal.  Skin: Skin is warm and dry.  Psychiatric: She has a normal mood and affect.           Assessment & Plan:

## 2019-01-18 NOTE — Patient Instructions (Signed)
Start checking your blood sugars once daily. You can check your sugars before any meal, 2 hours after any meal, bedtime. Write down your readings.  Please call me if you notice readings consistently above 150.  We will see you in August for our physical.   It was a pleasure to see you today!

## 2019-01-18 NOTE — Assessment & Plan Note (Signed)
Stable in the office today. Continue current regimen. BMP due in August 2020.

## 2019-01-22 ENCOUNTER — Ambulatory Visit (INDEPENDENT_AMBULATORY_CARE_PROVIDER_SITE_OTHER): Payer: Medicare Other | Admitting: Podiatry

## 2019-01-22 ENCOUNTER — Other Ambulatory Visit: Payer: Self-pay

## 2019-01-22 ENCOUNTER — Encounter: Payer: Self-pay | Admitting: Podiatry

## 2019-01-22 DIAGNOSIS — M79674 Pain in right toe(s): Secondary | ICD-10-CM

## 2019-01-22 DIAGNOSIS — B351 Tinea unguium: Secondary | ICD-10-CM | POA: Diagnosis not present

## 2019-01-22 DIAGNOSIS — M79675 Pain in left toe(s): Secondary | ICD-10-CM

## 2019-01-22 DIAGNOSIS — E119 Type 2 diabetes mellitus without complications: Secondary | ICD-10-CM | POA: Diagnosis not present

## 2019-01-22 NOTE — Progress Notes (Signed)
Complaint:  Visit Type: Patient returns to my office for continued preventative foot care services. Complaint: Patient states" my nails have grown long and thick and become painful to walk and wear shoes" Patient has been diagnosed with DM with no foot complications. The patient presents for preventative foot care services. No changes to ROS.  Patient says her midfoot arthritis pain has resolved.  She says there is nighttime swelling both feet.  Podiatric Exam: Vascular: dorsalis pedis and posterior tibial pulses are palpable bilateral. Capillary return is immediate. Temperature gradient is WNL. Skin turgor WNL  Sensorium: Normal Semmes Weinstein monofilament test. Normal tactile sensation bilaterally. Nail Exam: Pt has thick disfigured discolored nails with subungual debris noted bilateral entire nail hallux Ulcer Exam: There is no evidence of ulcer or pre-ulcerative changes or infection. Orthopedic Exam: Muscle tone and strength are WNL. No limitations in general ROM. No crepitus or effusions noted. Foot type and digits show no abnormalities. Bony prominences are unremarkable. Skin: No Porokeratosis. No infection or ulcers  Diagnosis:  Onychomycosis, , Pain in right toe, pain in left toes  Midfoot arthritis.  Treatment & Plan Procedures and Treatment: Consent by patient was obtained for treatment procedures.   Debridement of mycotic and hypertrophic toenails, 1 through 5 bilateral and clearing of subungual debris. No ulceration, no infection noted. ROV for arthritis evaluation. Return Visit-Office Procedure: Patient instructed to return to the office for a follow up visit 3 months for continued evaluation and treatment.    Gardiner Barefoot DPM

## 2019-01-30 ENCOUNTER — Telehealth: Payer: Self-pay | Admitting: Primary Care

## 2019-01-30 DIAGNOSIS — E119 Type 2 diabetes mellitus without complications: Secondary | ICD-10-CM

## 2019-01-30 MED ORDER — METFORMIN HCL 500 MG PO TABS: ORAL_TABLET | ORAL | 0 refills | Status: DC

## 2019-01-30 NOTE — Telephone Encounter (Signed)
Best number [phone] Pt called to get a refill on metformin [facility] [address] [place]  [facility]  Pt is out of meds

## 2019-01-30 NOTE — Telephone Encounter (Signed)
Pt returned your call, she requests a cb

## 2019-01-30 NOTE — Telephone Encounter (Signed)
Left detail message for patient. Need patient to call back.   Last refill was for 1 year since last August to [facility].  [facility] but need to speak with patient.

## 2019-01-31 DIAGNOSIS — E119 Type 2 diabetes mellitus without complications: Secondary | ICD-10-CM | POA: Diagnosis not present

## 2019-01-31 DIAGNOSIS — H2513 Age-related nuclear cataract, bilateral: Secondary | ICD-10-CM | POA: Diagnosis not present

## 2019-01-31 LAB — HM DIABETES EYE EXAM

## 2019-01-31 MED ORDER — METFORMIN HCL 500 MG PO TABS: ORAL_TABLET | ORAL | 1 refills | Status: DC

## 2019-01-31 NOTE — Telephone Encounter (Signed)
Spoken to patient and she is no longer using [facility]. Will send refills to [facility] as requested.

## 2019-02-01 ENCOUNTER — Encounter: Payer: Self-pay | Admitting: Primary Care

## 2019-03-08 ENCOUNTER — Other Ambulatory Visit: Payer: Self-pay | Admitting: Primary Care

## 2019-03-08 DIAGNOSIS — I10 Essential (primary) hypertension: Secondary | ICD-10-CM

## 2019-03-25 ENCOUNTER — Other Ambulatory Visit: Payer: Self-pay | Admitting: Primary Care

## 2019-03-25 DIAGNOSIS — E559 Vitamin D deficiency, unspecified: Secondary | ICD-10-CM

## 2019-03-25 DIAGNOSIS — I10 Essential (primary) hypertension: Secondary | ICD-10-CM

## 2019-03-25 DIAGNOSIS — E119 Type 2 diabetes mellitus without complications: Secondary | ICD-10-CM

## 2019-03-27 ENCOUNTER — Other Ambulatory Visit: Payer: Self-pay

## 2019-03-27 ENCOUNTER — Other Ambulatory Visit (INDEPENDENT_AMBULATORY_CARE_PROVIDER_SITE_OTHER): Payer: Medicare Other

## 2019-03-27 ENCOUNTER — Ambulatory Visit (INDEPENDENT_AMBULATORY_CARE_PROVIDER_SITE_OTHER): Payer: Medicare Other

## 2019-03-27 VITALS — BP 186/73 | HR 91 | Temp 98.1°F | Ht 65.0 in | Wt 125.0 lb

## 2019-03-27 DIAGNOSIS — I10 Essential (primary) hypertension: Secondary | ICD-10-CM | POA: Diagnosis not present

## 2019-03-27 DIAGNOSIS — Z0000 Encounter for general adult medical examination without abnormal findings: Secondary | ICD-10-CM | POA: Diagnosis not present

## 2019-03-27 DIAGNOSIS — E559 Vitamin D deficiency, unspecified: Secondary | ICD-10-CM | POA: Diagnosis not present

## 2019-03-27 DIAGNOSIS — E119 Type 2 diabetes mellitus without complications: Secondary | ICD-10-CM | POA: Diagnosis not present

## 2019-03-27 LAB — COMPREHENSIVE METABOLIC PANEL
ALT: 16 U/L (ref 0–35)
AST: 17 U/L (ref 0–37)
Albumin: 4.1 g/dL (ref 3.5–5.2)
Alkaline Phosphatase: 66 U/L (ref 39–117)
BUN: 14 mg/dL (ref 6–23)
CO2: 27 mEq/L (ref 19–32)
Calcium: 9.5 mg/dL (ref 8.4–10.5)
Chloride: 102 mEq/L (ref 96–112)
Creatinine, Ser: 0.95 mg/dL (ref 0.40–1.20)
GFR: 56.44 mL/min — ABNORMAL LOW (ref >60.00)
Glucose, Bld: 163 mg/dL — ABNORMAL HIGH (ref 70–99)
Potassium: 4.9 mEq/L (ref 3.5–5.1)
Sodium: 135 mEq/L (ref 135–145)
Total Bilirubin: 0.6 mg/dL (ref 0.2–1.2)
Total Protein: 7.7 g/dL (ref 6.0–8.3)

## 2019-03-27 LAB — LIPID PANEL
Cholesterol: 204 mg/dL — ABNORMAL HIGH (ref 0–200)
HDL: 46.8 mg/dL (ref >39.00)
LDL Cholesterol: 119 mg/dL — ABNORMAL HIGH (ref 0–99)
NonHDL: 157.46
Total CHOL/HDL Ratio: 4
Triglycerides: 190 mg/dL — ABNORMAL HIGH (ref 0.0–149.0)
VLDL: 38 mg/dL (ref 0.0–40.0)

## 2019-03-27 LAB — CBC
HCT: 36.9 % (ref 36.0–46.0)
Hemoglobin: 12.6 g/dL (ref 12.0–15.0)
MCHC: 34.1 g/dL (ref 30.0–36.0)
MCV: 93.1 fl (ref 78.0–100.0)
Platelets: 216 10*3/uL (ref 150.0–400.0)
RBC: 3.96 Mil/uL (ref 3.87–5.11)
RDW: 13.4 % (ref 11.5–15.5)
WBC: 6.4 10*3/uL (ref 4.0–10.5)

## 2019-03-27 LAB — HEMOGLOBIN A1C: Hgb A1c MFr Bld: 8.1 % — ABNORMAL HIGH (ref 4.6–6.5)

## 2019-03-27 LAB — VITAMIN D 25 HYDROXY (VIT D DEFICIENCY, FRACTURES): VITD: 61.67 ng/mL (ref 30.00–100.00)

## 2019-03-27 NOTE — Progress Notes (Signed)
PCP notes:  Health Maintenance:  No gaps  Abnormal Screenings:  None  Patient concerns:   none  Nurse concerns:  None  Next PCP appt.: 03/30/2019 at 8:00

## 2019-03-27 NOTE — Patient Instructions (Signed)
Morgan Bowen , Thank you for taking time to come for your Medicare Wellness Visit. I appreciate your ongoing commitment to your health goals. Please review the following plan we discussed and let me know if I can assist you in the future.   Screening recommendations/referrals: Colonoscopy: not required Mammogram: up to date per patient Bone Density: 07/2017 Recommended yearly ophthalmology/optometry visit for glaucoma screening and checkup Recommended yearly dental visit for hygiene and checkup  Vaccinations: Influenza vaccine: postponed Pneumococcal vaccine: 10/2017 Tdap vaccine: 07/2015 Shingles vaccine: discussed    Advanced directives: Please bring a copy of your POA (Power of [place]) and/or Living Will to your next appointment.    Conditions/risks identified: none  Next appointment: 03/30/2019 at 8:00   Preventive Care 81 Years and Older, Female Preventive care refers to lifestyle choices and visits with your health care provider that can promote health and wellness. What does preventive care include?  A yearly physical exam. This is also called an annual well check.  Dental exams once or twice a year.  Routine eye exams. Ask your health care provider how often you should have your eyes checked.  Personal lifestyle choices, including:  Daily care of your teeth and gums.  Regular physical activity.  Eating a healthy diet.  Avoiding tobacco and drug use.  Limiting alcohol use.  Practicing safe sex.  Taking low-dose aspirin every day.  Taking vitamin and mineral supplements as recommended by your health care provider. What happens during an annual well check? The services and screenings done by your health care provider during your annual well check will depend on your age, overall health, lifestyle risk factors, and family history of disease. Counseling  Your health care provider may ask you questions about your:  Alcohol use.  Tobacco use.  Drug use.   Emotional well-being.  Home and relationship well-being.  Sexual activity.  Eating habits.  History of falls.  Memory and ability to understand (cognition).  Work and work Statistician.  Reproductive health. Screening  You may have the following tests or measurements:  Height, weight, and BMI.  Blood pressure.  Lipid and cholesterol levels. These may be checked every 5 years, or more frequently if you are over 41 years old.  Skin check.  Lung cancer screening. You may have this screening every year starting at age 54 if you have a 30-pack-year history of smoking and currently smoke or have quit within the past 15 years.  Fecal occult blood test (FOBT) of the stool. You may have this test every year starting at age 54.  Flexible sigmoidoscopy or colonoscopy. You may have a sigmoidoscopy every 5 years or a colonoscopy every 10 years starting at age 74.  Hepatitis C blood test.  Hepatitis B blood test.  Sexually transmitted disease (STD) testing.  Diabetes screening. This is done by checking your blood sugar (glucose) after you have not eaten for a while (fasting). You may have this done every 1-3 years.  Bone density scan. This is done to screen for osteoporosis. You may have this done starting at age 78.  Mammogram. This may be done every 1-2 years. Talk to your health care provider about how often you should have regular mammograms. Talk with your health care provider about your test results, treatment options, and if necessary, the need for more tests. Vaccines  Your health care provider may recommend certain vaccines, such as:  Influenza vaccine. This is recommended every year.  Tetanus, diphtheria, and acellular pertussis (Tdap, Td) vaccine. You  may need a Td booster every 10 years.  Zoster vaccine. You may need this after age 43.  Pneumococcal 13-valent conjugate (PCV13) vaccine. One dose is recommended after age 72.  Pneumococcal polysaccharide (PPSV23)  vaccine. One dose is recommended after age 22. Talk to your health care provider about which screenings and vaccines you need and how often you need them. This information is not intended to replace advice given to you by your health care provider. Make sure you discuss any questions you have with your health care provider. Document Released: 08/22/2015 Document Revised: 04/14/2016 Document Reviewed: 05/27/2015 Elsevier Interactive Patient Education  2017 [place] Prevention in the Home Falls can cause injuries. They can happen to people of all ages. There are many things you can do to make your home safe and to help prevent falls. What can I do on the outside of my home?  Regularly fix the edges of walkways and driveways and fix any cracks.  Remove anything that might make you trip as you walk through a door, such as a raised step or threshold.  Trim any bushes or trees on the path to your home.  Use bright outdoor lighting.  Clear any walking paths of anything that might make someone trip, such as rocks or tools.  Regularly check to see if handrails are loose or broken. Make sure that both sides of any steps have handrails.  Any raised decks and porches should have guardrails on the edges.  Have any leaves, snow, or ice cleared regularly.  Use sand or salt on walking paths during winter.  Clean up any spills in your garage right away. This includes oil or grease spills. What can I do in the bathroom?  Use night lights.  Install grab bars by the toilet and in the tub and shower. Do not use towel bars as grab bars.  Use non-skid mats or decals in the tub or shower.  If you need to sit down in the shower, use a plastic, non-slip stool.  Keep the floor dry. Clean up any water that spills on the floor as soon as it happens.  Remove soap buildup in the tub or shower regularly.  Attach bath mats securely with double-sided non-slip rug tape.  Do not have throw rugs  and other things on the floor that can make you trip. What can I do in the bedroom?  Use night lights.  Make sure that you have a light by your bed that is easy to reach.  Do not use any sheets or blankets that are too big for your bed. They should not hang down onto the floor.  Have a firm chair that has side arms. You can use this for support while you get dressed.  Do not have throw rugs and other things on the floor that can make you trip. What can I do in the kitchen?  Clean up any spills right away.  Avoid walking on wet floors.  Keep items that you use a lot in easy-to-reach places.  If you need to reach something above you, use a strong step stool that has a grab bar.  Keep electrical cords out of the way.  Do not use floor polish or wax that makes floors slippery. If you must use wax, use non-skid floor wax.  Do not have throw rugs and other things on the floor that can make you trip. What can I do with my stairs?  Do not leave any items  on the stairs.  Make sure that there are handrails on both sides of the stairs and use them. Fix handrails that are broken or loose. Make sure that handrails are as long as the stairways.  Check any carpeting to make sure that it is firmly attached to the stairs. Fix any carpet that is loose or worn.  Avoid having throw rugs at the top or bottom of the stairs. If you do have throw rugs, attach them to the floor with carpet tape.  Make sure that you have a light switch at the top of the stairs and the bottom of the stairs. If you do not have them, ask someone to add them for you. What else can I do to help prevent falls?  Wear shoes that:  Do not have high heels.  Have rubber bottoms.  Are comfortable and fit you well.  Are closed at the toe. Do not wear sandals.  If you use a stepladder:  Make sure that it is fully opened. Do not climb a closed stepladder.  Make sure that both sides of the stepladder are locked into  place.  Ask someone to hold it for you, if possible.  Clearly mark and make sure that you can see:  Any grab bars or handrails.  First and last steps.  Where the edge of each step is.  Use tools that help you move around (mobility aids) if they are needed. These include:  Canes.  Walkers.  Scooters.  Crutches.  Turn on the lights when you go into a dark area. Replace any light bulbs as soon as they burn out.  Set up your furniture so you have a clear path. Avoid moving your furniture around.  If any of your floors are uneven, fix them.  If there are any pets around you, be aware of where they are.  Review your medicines with your doctor. Some medicines can make you feel dizzy. This can increase your chance of falling. Ask your doctor what other things that you can do to help prevent falls. This information is not intended to replace advice given to you by your health care provider. Make sure you discuss any questions you have with your health care provider. Document Released: 05/22/2009 Document Revised: 01/01/2016 Document Reviewed: 08/30/2014 Elsevier Interactive Patient Education  2017 [employer].

## 2019-03-27 NOTE — Progress Notes (Signed)
Subjective:   DELTA PICHON is a 83 y.o. female who presents for Medicare Annual (Subsequent) preventive examination.  This visit type was conducted due to national recommendations for restrictions regarding the COVID-19 Pandemic (e.g. social distancing). This format is felt to be most appropriate for this patient at this time. All issues noted in this document were discussed and addressed. No physical exam was performed (except for noted visual exam findings with Video Visits). This patient, Ms. Rocco Serene, has given permission to perform this visit via telephone. Vital signs may be absent or patient reported.  Patient location:  At home  Nurse location:  At home     Review of Systems:  n/a Cardiac Risk Factors include: advanced age (>8men, >7 women);diabetes mellitus;hypertension     Objective:     Vitals: BP (!) 186/73 Comment: per patient  Pulse 91 Comment: per patient  Temp 98.1 F (36.7 C) Comment: per patient  Ht 5\' 5"  (1.651 m) Comment: per patient  Wt 125 lb (56.7 kg) Comment: per patient  BMI 20.80 kg/m   Body mass index is 20.8 kg/m.  Advanced Directives 03/27/2019 06/06/2018 06/05/2018 03/16/2018  Does Patient Have a Medical Advance Directive? Yes Yes Yes Yes  Type of Paramedic of [place];Living will Living will;Healthcare Power of [place];Living will  Copy of [place] in Chart? No - copy requested - - No - copy requested    Tobacco Social History   Tobacco Use  Smoking Status Never Smoker  Smokeless Tobacco Never Used     Counseling given: Not Answered   Clinical Intake:  Pre-visit preparation completed: Yes  Pain : No/denies pain     Nutritional Status: BMI of 19-24  Normal Nutritional Risks: None Diabetes: Yes CBG done?: No Did pt. bring in CBG monitor from home?: No  How often do you need to have someone help you when you read  instructions, pamphlets, or other written materials from your doctor or pharmacy?: 1 - Never What is the last grade level you completed in school?: 12th grade  Interpreter Needed?: No  Information entered by :: NAllen LPN  Past Medical History:  Diagnosis Date  . Acute cystitis 05/23/2018  . Anxiety   . Arthritis   . Cellulitis 12/13/2018  . Chickenpox   . Diabetes mellitus without complication ([place])   . GERD (gastroesophageal reflux disease)   . Hypertension   . Osteoporosis   . Type 2 diabetes mellitus ([place])   . Urinary tract infection    Past Surgical History:  Procedure Laterality Date  . ABDOMINAL HYSTERECTOMY  1984  . ABDOMINAL HYSTERECTOMY    . APPENDECTOMY  1958  . CHOLECYSTECTOMY    . GALLBLADDER SURGERY    . MANDIBLE FRACTURE SURGERY  07/13/2016  . TONSILLECTOMY  1958  . TONSILLECTOMY    . WRIST FRACTURE SURGERY Right    2000   Family History  Problem Relation Age of Onset  . Hypertension Mother   . Stroke Mother   . Stroke Father   . Hypertension Father   . Hypertension Sister   . Kidney disease Sister   . Breast cancer Neg Hx    Social History   Socioeconomic History  . Marital status: Widowed    Spouse name: Not on file  . Number of children: Not on file  . Years of education: Not on file  . Highest education level: Not on file  Occupational History  .  Not on file  Social Needs  . Financial resource strain: Not hard at all  . Food insecurity    Worry: Never true    Inability: Never true  . Transportation needs    Medical: No    Non-medical: No  Tobacco Use  . Smoking status: Never Smoker  . Smokeless tobacco: Never Used  Substance and Sexual Activity  . Alcohol use: No    Frequency: Never  . Drug use: No  . Sexual activity: Not Currently  Lifestyle  . Physical activity    Days per week: 7 days    Minutes per session: 30 min  . Stress: Not at all  Relationships  . Social Herbalist on phone: Not on file    Gets  together: Not on file    Attends religious service: Not on file    Active member of club or organization: Not on file    Attends meetings of clubs or organizations: Not on file    Relationship status: Not on file  Other Topics Concern  . Not on file  Social History Narrative   ** Merged History Encounter **       Widowed. Moved from [place]. 1 son, 3 grandchildren. Retired. Once worked as a Agricultural engineer.     Outpatient Encounter Medications as of 03/27/2019  Medication Sig  . diclofenac sodium (VOLTAREN) 1 % GEL   . diltiazem (CARDIZEM CD) 300 MG 24 hr capsule TAKE 1 CAPSULE BY MOUTH EVERY DAY  . diltiazem (TIAZAC) 300 MG 24 hr capsule Take 1 capsule by mouth once daily for blood pressure.  Marland Kitchen glimepiride (AMARYL) 2 MG tablet Take 1 tablet by mouth with breakfast for diabetes.  Marland Kitchen glucose blood (ONE TOUCH ULTRA TEST) test strip USE AS DIRECTED TEST 3 TIMES DAILY E11.9  . irbesartan (AVAPRO) 300 MG tablet Take 1 tablet (300 mg total) by mouth daily.  . metFORMIN (GLUCOPHAGE) 500 MG tablet Take 1 tablet by mouth twice daily with food for diabetes.  . metoprolol succinate (TOPROL-XL) 25 MG 24 hr tablet Take 1 tablet (25 mg total) by mouth daily. For blood pressure.  . Vitamin D, Ergocalciferol, (DRISDOL) 1.25 MG (50000 UT) CAPS capsule TAKE ONE CAPSULE BY MOUTH ONE TIME PER WEEK   No facility-administered encounter medications on file as of 03/27/2019.     Activities of Daily Living In your present state of health, do you have any difficulty performing the following activities: 03/27/2019  Hearing? N  Vision? N  Difficulty concentrating or making decisions? N  Walking or climbing stairs? N  Dressing or bathing? N  Doing errands, shopping? N  Preparing Food and eating ? N  Using the Toilet? N  In the past six months, have you accidently leaked urine? N  Do you have problems with loss of bowel control? N  Managing your Medications? N  Managing your Finances? N  Housekeeping or  managing your Housekeeping? N  Some recent data might be hidden    Patient Care Team: [name], NP as PCP - General (Internal Medicine) [name], NP (Internal Medicine)    Assessment:   This is a routine wellness examination for [place].  Exercise Activities and Dietary recommendations Current Exercise Habits: Home exercise routine, Type of exercise: walking, Time (Minutes): 30, Frequency (Times/Week): 7, Weekly Exercise (Minutes/Week): 210  Goals    . Patient Stated     Starting 03/16/2018, I will continue to take medications as prescribed.     Marland Kitchen  Patient Stated     03/27/2019, to remain healthy       Fall Risk Fall Risk  03/27/2019 03/16/2018 10/24/2017  Falls in the past year? 0 No Yes  Number falls in past yr: - - 1  Injury with Fall? - - Yes  Risk for fall due to : Impaired balance/gait;Medication side effect - -  Follow up Falls evaluation completed;Falls prevention discussed - -   Is the patient's home free of loose throw rugs in walkways, pet beds, electrical cords, etc?   yes      Grab bars in the bathroom? no      Handrails on the stairs?   yes      Adequate lighting?   yes  Timed Get Up and Go performed: n/a  Depression Screen PHQ 2/9 Scores 03/27/2019 03/16/2018 08/26/2017  PHQ - 2 Score 0 0 3  PHQ- 9 Score 0 0 6     Cognitive Function MMSE - Mini Mental State Exam 03/27/2019 03/16/2018  Orientation to time 5 5  Orientation to Place 5 5  Registration 3 3  Attention/ Calculation 5 0  Recall 3 3  Language- name 2 objects 0 0  Language- repeat 1 1  Language- follow 3 step command 0 3  Language- read & follow direction 0 0  Write a sentence 0 0  Copy design 0 0  Total score 22 20   Mini Cog  Mini-Cog screen was completed. Maximum score is 22. A value of 0 denotes this part of the MMSE was not completed or the patient failed this part of the Mini-Cog screening.       Immunization History  Administered Date(s) Administered  . Influenza,  High Dose Seasonal PF 05/09/2012  . Pneumococcal Conjugate-13 10/24/2017  . Pneumococcal Polysaccharide-23 08/23/2013  . Tdap 07/14/2015  . Zoster 08/06/2013, 12/08/2014    Qualifies for Shingles Vaccine? yes  Screening Tests Health Maintenance  Topic Date Due  . HEMOGLOBIN A1C  12/25/2018  . INFLUENZA VACCINE  03/16/2049 (Originally 03/10/2019)  . FOOT EXAM  10/23/2019  . OPHTHALMOLOGY EXAM  01/31/2020  . TETANUS/TDAP  07/13/2025  . DEXA SCAN  Completed  . PNA vac Low Risk Adult  Completed    Cancer Screenings: Lung: Low Dose CT Chest recommended if Age 75-80 years, 30 pack-year currently smoking OR have quit w/in 15years. Patient does not qualify. Breast:  Up to date on Mammogram? Yes   Up to date of Bone Density/Dexa? Yes Colorectal: not required  Additional Screenings: : Hepatitis C Screening: n/a     Plan:    Patient's goal is to stay healthy.   I have personally reviewed and noted the following in the patient's chart:   . Medical and social history . Use of alcohol, tobacco or illicit drugs  . Current medications and supplements . Functional ability and status . Nutritional status . Physical activity . Advanced directives . List of other physicians . Hospitalizations, surgeries, and ER visits in previous 12 months . Vitals . Screenings to include cognitive, depression, and falls . Referrals and appointments  In addition, I have reviewed and discussed with patient certain preventive protocols, quality metrics, and best practice recommendations. A written personalized care plan for preventive services as well as general preventive health recommendations were provided to patient.     Kellie Simmering, LPN  [id number]

## 2019-03-28 ENCOUNTER — Ambulatory Visit: Payer: Medicare Other

## 2019-03-30 ENCOUNTER — Encounter: Payer: Self-pay | Admitting: Primary Care

## 2019-03-30 ENCOUNTER — Other Ambulatory Visit: Payer: Self-pay

## 2019-03-30 ENCOUNTER — Ambulatory Visit (INDEPENDENT_AMBULATORY_CARE_PROVIDER_SITE_OTHER): Payer: Medicare Other | Admitting: Primary Care

## 2019-03-30 VITALS — BP 136/80 | HR 80 | Temp 97.8°F | Ht 65.0 in | Wt 128.0 lb

## 2019-03-30 DIAGNOSIS — M810 Age-related osteoporosis without current pathological fracture: Secondary | ICD-10-CM

## 2019-03-30 DIAGNOSIS — F419 Anxiety disorder, unspecified: Secondary | ICD-10-CM

## 2019-03-30 DIAGNOSIS — I10 Essential (primary) hypertension: Secondary | ICD-10-CM

## 2019-03-30 DIAGNOSIS — Z1239 Encounter for other screening for malignant neoplasm of breast: Secondary | ICD-10-CM | POA: Diagnosis not present

## 2019-03-30 DIAGNOSIS — N289 Disorder of kidney and ureter, unspecified: Secondary | ICD-10-CM

## 2019-03-30 DIAGNOSIS — E559 Vitamin D deficiency, unspecified: Secondary | ICD-10-CM | POA: Diagnosis not present

## 2019-03-30 DIAGNOSIS — F329 Major depressive disorder, single episode, unspecified: Secondary | ICD-10-CM

## 2019-03-30 DIAGNOSIS — E2839 Other primary ovarian failure: Secondary | ICD-10-CM | POA: Diagnosis not present

## 2019-03-30 DIAGNOSIS — E119 Type 2 diabetes mellitus without complications: Secondary | ICD-10-CM

## 2019-03-30 DIAGNOSIS — Z23 Encounter for immunization: Secondary | ICD-10-CM

## 2019-03-30 DIAGNOSIS — F32A Depression, unspecified: Secondary | ICD-10-CM

## 2019-03-30 MED ORDER — GLIMEPIRIDE 2 MG PO TABS: 3.0000 mg | ORAL_TABLET | Freq: Every day | ORAL | 3 refills | Status: DC

## 2019-03-30 NOTE — Assessment & Plan Note (Signed)
Overall doing well, continue to monitor.

## 2019-03-30 NOTE — Progress Notes (Signed)
Subjective:    Patient ID: Morgan Bowen, female    DOB: 1936-02-12, 83 y.o.   MRN: [id number]  HPI  Morgan Bowen is an 83 year old female who presents today for [place] Part 2.  Immunizations: -Tetanus: Completed in 2016 -Influenza: Due, declines. -Pneumonia: Completed last in 2019 -Shingles: Completed  Eye exam: Up to date Colonoscopy: Completed three years ago, no further testing needed. Mammogram: Completed in 2019 Dexa: Completed in 2018, osteoporosis  BP Readings from Last 3 Encounters:  03/30/19 136/80  03/27/19 (!) 186/73  01/18/19 140/78     Review of Systems  Constitutional: Negative for unexpected weight change.  HENT: Negative for rhinorrhea.   Respiratory: Negative for cough and shortness of breath.   Cardiovascular: Negative for chest pain.  Gastrointestinal: Negative for constipation and diarrhea.  Genitourinary: Negative for difficulty urinating.  Musculoskeletal: Negative for arthralgias and myalgias.  Skin: Negative for rash.  Allergic/Immunologic: Negative for environmental allergies.  Neurological: Negative for numbness and headaches.       Past Medical History:  Diagnosis Date  . Acute cystitis 05/23/2018  . Anxiety   . Arthritis   . Cellulitis 12/13/2018  . Chickenpox   . Diabetes mellitus without complication ([place])   . GERD (gastroesophageal reflux disease)   . Hypertension   . Osteoporosis   . Type 2 diabetes mellitus ([place])   . Urinary tract infection      Social History   Socioeconomic History  . Marital status: Widowed    Spouse name: Not on file  . Number of children: Not on file  . Years of education: Not on file  . Highest education level: Not on file  Occupational History  . Not on file  Social Needs  . Financial resource strain: Not hard at all  . Food insecurity    Worry: Never true    Inability: Never true  . Transportation needs    Medical: No    Non-medical: No  Tobacco Use  . Smoking status: Never Smoker  .  Smokeless tobacco: Never Used  Substance and Sexual Activity  . Alcohol use: No    Frequency: Never  . Drug use: No  . Sexual activity: Not Currently  Lifestyle  . Physical activity    Days per week: 7 days    Minutes per session: 30 min  . Stress: Not at all  Relationships  . Social Herbalist on phone: Not on file    Gets together: Not on file    Attends religious service: Not on file    Active member of club or organization: Not on file    Attends meetings of clubs or organizations: Not on file    Relationship status: Not on file  . Intimate partner violence    Fear of current or ex partner: No    Emotionally abused: No    Physically abused: No    Forced sexual activity: No  Other Topics Concern  . Not on file  Social History Narrative   ** Merged History Encounter **       Widowed. Moved from [place]. 1 son, 3 grandchildren. Retired. Once worked as a Agricultural engineer.     Past Surgical History:  Procedure Laterality Date  . ABDOMINAL HYSTERECTOMY  1984  . ABDOMINAL HYSTERECTOMY    . APPENDECTOMY  1958  . CHOLECYSTECTOMY    . GALLBLADDER SURGERY    . MANDIBLE FRACTURE SURGERY  07/13/2016  . TONSILLECTOMY  1958  .  TONSILLECTOMY    . WRIST FRACTURE SURGERY Right    2000    Family History  Problem Relation Age of Onset  . Hypertension Mother   . Stroke Mother   . Stroke Father   . Hypertension Father   . Hypertension Sister   . Kidney disease Sister   . Breast cancer Neg Hx     Allergies  Allergen Reactions  . Sulfamethoxazole-Trimethoprim   . Penicillins Rash    Current Outpatient Medications on File Prior to Visit  Medication Sig Dispense Refill  . diclofenac sodium (VOLTAREN) 1 % GEL     . diltiazem (TIAZAC) 300 MG 24 hr capsule Take 1 capsule by mouth once daily for blood pressure. 90 capsule 3  . glucose blood (ONE TOUCH ULTRA TEST) test strip USE AS DIRECTED TEST 3 TIMES DAILY E11.9 100 each 2  . irbesartan (AVAPRO) 300 MG tablet  Take 1 tablet (300 mg total) by mouth daily. 90 tablet 3  . metFORMIN (GLUCOPHAGE) 500 MG tablet Take 1 tablet by mouth twice daily with food for diabetes. 180 tablet 1  . metoprolol succinate (TOPROL-XL) 25 MG 24 hr tablet Take 1 tablet (25 mg total) by mouth daily. For blood pressure. 90 tablet 3  . Vitamin D, Ergocalciferol, (DRISDOL) 1.25 MG (50000 UT) CAPS capsule TAKE ONE CAPSULE BY MOUTH ONE TIME PER WEEK 12 capsule 3   No current facility-administered medications on file prior to visit.     BP 136/80   Pulse 80   Temp 97.8 F (36.6 C) (Temporal)   Ht 5\' 5"  (1.651 m)   Wt 128 lb (58.1 kg)   SpO2 97%   BMI 21.30 kg/m    Objective:   Physical Exam  Constitutional: She appears well-nourished.  Neck: Neck supple.  Cardiovascular: Normal rate and regular rhythm.  Respiratory: Effort normal and breath sounds normal.  Skin: Skin is warm and dry.  Psychiatric: She has a normal mood and affect.           Assessment & Plan:

## 2019-03-30 NOTE — Assessment & Plan Note (Signed)
Stable in the office today, continue current regimen.  BMP reviewed. 

## 2019-03-30 NOTE — Patient Instructions (Signed)
Call the [facility] to schedule your mammogram and bone density scan.  You can try famotidine (Pepcid) 1-2 times daily for heartburn if needed.  Start exercising. You should be getting 150 minutes of exercise weekly.  Ensure you are consuming 64 ounces of water daily.  Schedule a lab only appointment for 3 months for diabetes check and a follow up visit for 6 months for diabetes check.  It was a pleasure to see you today!

## 2019-03-30 NOTE — Assessment & Plan Note (Signed)
Due for repeat bone density scan, pending. Compliant to calcium and vitamin D, continue same. Has declined bisphosphonate treatment.

## 2019-03-30 NOTE — Assessment & Plan Note (Signed)
Compliant to weekly vitamin D 50,000 units. Recent vitamin D level stable. Continue same.

## 2019-03-30 NOTE — Assessment & Plan Note (Addendum)
Recent A1C of 8.1, compliant to metformin and is actually taking glimepiride 3 mg.   Work on increasing exercise and improving diet. She has been very sedentary recently.   Foot exam UTD. Pneumonia vaccination UTD. Eye exam UTD. Managed on ARB. Declines statin therapy despite recommendations.   Repeat A1C in 3 months, follow up in office in 6 months.

## 2019-03-30 NOTE — Assessment & Plan Note (Signed)
Recent slight decrease in function, overall stable. Continue to monitor.  Continue ARB.

## 2019-03-30 NOTE — Addendum Note (Signed)
Addended by: Jacqualin Combes on: 03/30/2019 09:14 AM   Modules accepted: Orders

## 2019-04-17 ENCOUNTER — Other Ambulatory Visit: Payer: Self-pay | Admitting: Primary Care

## 2019-04-17 DIAGNOSIS — Z1231 Encounter for screening mammogram for malignant neoplasm of breast: Secondary | ICD-10-CM

## 2019-04-23 ENCOUNTER — Other Ambulatory Visit: Payer: Self-pay

## 2019-04-23 ENCOUNTER — Ambulatory Visit (INDEPENDENT_AMBULATORY_CARE_PROVIDER_SITE_OTHER): Payer: Medicare Other | Admitting: Podiatry

## 2019-04-23 ENCOUNTER — Encounter: Payer: Self-pay | Admitting: Podiatry

## 2019-04-23 DIAGNOSIS — E119 Type 2 diabetes mellitus without complications: Secondary | ICD-10-CM | POA: Diagnosis not present

## 2019-04-23 DIAGNOSIS — M79674 Pain in right toe(s): Secondary | ICD-10-CM | POA: Diagnosis not present

## 2019-04-23 DIAGNOSIS — B351 Tinea unguium: Secondary | ICD-10-CM | POA: Diagnosis not present

## 2019-04-23 DIAGNOSIS — M79675 Pain in left toe(s): Secondary | ICD-10-CM

## 2019-04-23 NOTE — Progress Notes (Signed)
Complaint:  Visit Type: Patient returns to my office for continued preventative foot care services. Complaint: Patient states" my nails have grown long and thick and become painful to walk and wear shoes" Patient has been diagnosed with DM with no foot complications. The patient presents for preventative foot care services. No changes to ROS.    Podiatric Exam: Vascular: dorsalis pedis and posterior tibial pulses are palpable bilateral. Capillary return is immediate. Temperature gradient is WNL. Skin turgor WNL  Sensorium: Normal Semmes Weinstein monofilament test. Normal tactile sensation bilaterally. Nail Exam: Pt has thick disfigured discolored nails with subungual debris noted bilateral entire nail hallux Ulcer Exam: There is no evidence of ulcer or pre-ulcerative changes or infection. Orthopedic Exam: Muscle tone and strength are WNL. No limitations in general ROM. No crepitus or effusions noted. Foot type and digits show no abnormalities. Bony prominences are unremarkable. Skin: No Porokeratosis. No infection or ulcers  Diagnosis:  Onychomycosis, , Pain in right toe, pain in left toes  Midfoot arthritis.  Treatment & Plan Procedures and Treatment: Consent by patient was obtained for treatment procedures.   Debridement of mycotic and hypertrophic toenails, 1 through 5 bilateral and clearing of subungual debris. No ulceration, no infection noted. ROV for arthritis evaluation. Return Visit-Office Procedure: Patient instructed to return to the office for a follow up visit 3 months for continued evaluation and treatment.    Gardiner Barefoot DPM

## 2019-05-14 ENCOUNTER — Telehealth: Payer: Self-pay | Admitting: *Deleted

## 2019-05-14 NOTE — Telephone Encounter (Signed)
Called patient back and while on the phone with her she checked her blood pressure and the reading was 216/100/pulse 86. Patient stated that she feels okay and is not having any symptoms. Patient stated that she took her blood pressure medicine over 2 hours ago. Pharmacy [facility] [address]

## 2019-05-14 NOTE — Telephone Encounter (Signed)
Patient called stating that she was told to call if her blood pressure was elevated. Patient stated that her blood pressure yesterday was 201/87 pulse 103. Patent stated that this morning her blood pressure was 195/98 and pulse 88. Patient stated that these readings were before her medication. Patient tired to check her blood pressure while on the phone with me and she kept getting error. Advised patient to rest a few minutes and check the batteries in the machine and I will call her back shortly and she verbalized understanding.

## 2019-05-14 NOTE — Telephone Encounter (Signed)
Noted and agree. 

## 2019-05-14 NOTE — Telephone Encounter (Signed)
Noted and greatly appreciate the follow up.

## 2019-05-14 NOTE — Telephone Encounter (Signed)
Called to follow-up on patient and her blood pressure now is 151/74 and heart rate 74. Patient will keep appointment in the morning. ER precautions given again and she verbalized understanding.

## 2019-05-14 NOTE — Telephone Encounter (Signed)
Called patient back and she rechecked her blood pressure and it was 193/94/ pulse 87. Patient stated that she has had some tingling in the bottom of her feet today, but she has had that before. ER precautions given to patient. Patient was advised to check her blood pressure about every two hours and if it does not continue to improve to let Allie Bossier NP know. Patient was scheduled for an office visit in the morning at 8:00 05/15/19 with Anda Kraft.

## 2019-05-15 ENCOUNTER — Other Ambulatory Visit: Payer: Self-pay

## 2019-05-15 ENCOUNTER — Ambulatory Visit (INDEPENDENT_AMBULATORY_CARE_PROVIDER_SITE_OTHER): Payer: Medicare Other | Admitting: Primary Care

## 2019-05-15 ENCOUNTER — Encounter: Payer: Self-pay | Admitting: Primary Care

## 2019-05-15 DIAGNOSIS — F419 Anxiety disorder, unspecified: Secondary | ICD-10-CM

## 2019-05-15 DIAGNOSIS — F329 Major depressive disorder, single episode, unspecified: Secondary | ICD-10-CM | POA: Diagnosis not present

## 2019-05-15 DIAGNOSIS — I10 Essential (primary) hypertension: Secondary | ICD-10-CM

## 2019-05-15 DIAGNOSIS — F32A Depression, unspecified: Secondary | ICD-10-CM

## 2019-05-15 MED ORDER — SERTRALINE HCL 25 MG PO TABS: 25.0000 mg | ORAL_TABLET | Freq: Every day | ORAL | 1 refills | Status: DC

## 2019-05-15 NOTE — Assessment & Plan Note (Signed)
Chronic and intermittent. GAD 7 score of 7 and PHQ 9 score of 2 today. Once managed on lorazepam daily which is not best practice.  Given her symptoms we will initiate low dose Zoloft at 25 mg daily. Patient is to take 1/2 tablet daily for 6 days, then advance to 1 full tablet thereafter. We discussed possible side effects of headache, GI upset, drowsiness, and SI/HI. If thoughts of SI/HI develop, we discussed to present to the emergency immediately. Patient verbalized understanding.   Follow up in 2 weeks for re-evaluation.

## 2019-05-15 NOTE — Assessment & Plan Note (Signed)
BP above goal in the office today, normal during her visit in August.  She is not monitoring her BP at home so we really don't have a clear picture of what she's running on a daily basis. Given her age I am reticent to add on yet another medication for BP control right now.  Will have her start monitoring BP at home on a daily basis and have her follow back up with [place] in 2 weeks with her logs.   In the meantime we will work on controlling anxiety as this may be a major contributing factor on her BP spikes.

## 2019-05-15 NOTE — Patient Instructions (Signed)
Start sertraline (Zoloft) 25 mg tablets for anxiety. Start by taking 1/2 tablet daily for 6 days, then increase to 1 full tablet thereafter.  Start monitoring your blood pressure daily, around the same time of day, for the next 2-3 weeks.  Ensure that you have rested for 30 minutes prior to checking your blood pressure. Record your readings and bring them to your next visit.  Call me if your blood pressure spikes at or above 180 on the top number.  Schedule a follow up visit with me for 2 weeks, bring your blood pressure logs.  It was a pleasure to see you today!

## 2019-05-15 NOTE — Progress Notes (Signed)
Subjective:    Patient ID: Morgan Bowen, female    DOB: November 27, 1935, 83 y.o.   MRN: [id number]  HPI  Ms. Fleitas is an 83 year old female with a history of hypertension, type 2 diabetes, anxiety and depression, decreased renal function who presents today for follow up of hypertension.  She called into our office yesterday with reports of elevated BP readings of 201/87, 195/98, 216/100 while at home. HR of 103 and 88. She was advised to rest and monitor her BP every 2 hours and report readings if possible. Our nurse contacted her several hours later and BP had reduced to 151/74 with a HR of 74. She was asked to come in today for evaluation.  She is managed on irbesartan 300 mg, diltiazem ER 300 mg, and metoprolol succinate 25 mg daily for which she is compliant. Last night she monitored her BP which was [id number] systolic. She denies dizziness, chest pain, shortness of breath, blurred vision.   She will monitor her BP less than once weekly as she doesn't think about it. She was once managed on HCTZ but this was removed 15 years ago due to elevated uric acid levels. She denies recent gout flare.   She admits to anxiety with Covid-19, also when she starts to miss her late husband. She is living alone within a retirement community and doesn't get along well with a particular neighbor. She is very social naturally and Covid-19 has affected this tremendously. She was once managed on lorazepam for which she took on a daily basis. GAD 7 score of 7 and PHQ 9 score of 2 today.   BP Readings from Last 3 Encounters:  05/15/19 (!) 184/80  03/30/19 136/80  03/27/19 (!) 186/73     Review of Systems  Eyes: Negative for visual disturbance.  Respiratory: Negative for shortness of breath.   Cardiovascular: Negative for chest pain.  Neurological: Negative for dizziness and headaches.  Psychiatric/Behavioral: Positive for sleep disturbance. The patient is nervous/anxious.        See HPI       Past Medical  History:  Diagnosis Date  . Acute cystitis 05/23/2018  . Anxiety   . Arthritis   . Cellulitis 12/13/2018  . Chickenpox   . Diabetes mellitus without complication ([place])   . GERD (gastroesophageal reflux disease)   . Hypertension   . Osteoporosis   . Type 2 diabetes mellitus ([place])   . Urinary tract infection      Social History   Socioeconomic History  . Marital status: Widowed    Spouse name: Not on file  . Number of children: Not on file  . Years of education: Not on file  . Highest education level: Not on file  Occupational History  . Not on file  Social Needs  . Financial resource strain: Not hard at all  . Food insecurity    Worry: Never true    Inability: Never true  . Transportation needs    Medical: No    Non-medical: No  Tobacco Use  . Smoking status: Never Smoker  . Smokeless tobacco: Never Used  Substance and Sexual Activity  . Alcohol use: No    Frequency: Never  . Drug use: No  . Sexual activity: Not Currently  Lifestyle  . Physical activity    Days per week: 7 days    Minutes per session: 30 min  . Stress: Not at all  Relationships  . Social Herbalist on phone:  Not on file    Gets together: Not on file    Attends religious service: Not on file    Active member of club or organization: Not on file    Attends meetings of clubs or organizations: Not on file    Relationship status: Not on file  . Intimate partner violence    Fear of current or ex partner: No    Emotionally abused: No    Physically abused: No    Forced sexual activity: No  Other Topics Concern  . Not on file  Social History Narrative   ** Merged History Encounter **       Widowed. Moved from [place]. 1 son, 3 grandchildren. Retired. Once worked as a Agricultural engineer.     Past Surgical History:  Procedure Laterality Date  . ABDOMINAL HYSTERECTOMY  1984  . ABDOMINAL HYSTERECTOMY    . APPENDECTOMY  1958  . CHOLECYSTECTOMY    . GALLBLADDER SURGERY    . MANDIBLE  FRACTURE SURGERY  07/13/2016  . TONSILLECTOMY  1958  . TONSILLECTOMY    . WRIST FRACTURE SURGERY Right    2000    Family History  Problem Relation Age of Onset  . Hypertension Mother   . Stroke Mother   . Stroke Father   . Hypertension Father   . Hypertension Sister   . Kidney disease Sister   . Breast cancer Neg Hx     Allergies  Allergen Reactions  . Sulfamethoxazole-Trimethoprim   . Penicillins Rash    Current Outpatient Medications on File Prior to Visit  Medication Sig Dispense Refill  . diclofenac sodium (VOLTAREN) 1 % GEL     . diltiazem (TIAZAC) 300 MG 24 hr capsule Take 1 capsule by mouth once daily for blood pressure. 90 capsule 3  . glimepiride (AMARYL) 2 MG tablet Take 1.5 tablets (3 mg total) by mouth daily with breakfast. 135 tablet 3  . glucose blood (ONE TOUCH ULTRA TEST) test strip USE AS DIRECTED TEST 3 TIMES DAILY E11.9 100 each 2  . irbesartan (AVAPRO) 300 MG tablet Take 1 tablet (300 mg total) by mouth daily. 90 tablet 3  . metFORMIN (GLUCOPHAGE) 500 MG tablet Take 1 tablet by mouth twice daily with food for diabetes. 180 tablet 1  . metoprolol succinate (TOPROL-XL) 25 MG 24 hr tablet Take 1 tablet (25 mg total) by mouth daily. For blood pressure. 90 tablet 3  . Vitamin D, Ergocalciferol, (DRISDOL) 1.25 MG (50000 UT) CAPS capsule TAKE ONE CAPSULE BY MOUTH ONE TIME PER WEEK 12 capsule 3   No current facility-administered medications on file prior to visit.     BP (!) 184/80   Pulse 80   Temp 97.9 F (36.6 C) (Temporal)   Ht 5\' 5"  (1.651 m)   Wt 128 lb (58.1 kg)   SpO2 97%   BMI 21.30 kg/m    Objective:   Physical Exam  Constitutional: She appears well-nourished.  Neck: Neck supple.  Cardiovascular: Normal rate and regular rhythm.  Respiratory: Effort normal and breath sounds normal.  Skin: Skin is warm and dry.  Psychiatric: She has a normal mood and affect.           Assessment & Plan:

## 2019-05-30 ENCOUNTER — Ambulatory Visit (INDEPENDENT_AMBULATORY_CARE_PROVIDER_SITE_OTHER): Payer: Medicare Other | Admitting: Primary Care

## 2019-05-30 ENCOUNTER — Other Ambulatory Visit: Payer: Self-pay

## 2019-05-30 VITALS — BP 162/64 | HR 66 | Temp 98.3°F | Wt 129.0 lb

## 2019-05-30 DIAGNOSIS — F329 Major depressive disorder, single episode, unspecified: Secondary | ICD-10-CM

## 2019-05-30 DIAGNOSIS — I10 Essential (primary) hypertension: Secondary | ICD-10-CM | POA: Diagnosis not present

## 2019-05-30 DIAGNOSIS — F32A Depression, unspecified: Secondary | ICD-10-CM

## 2019-05-30 DIAGNOSIS — F419 Anxiety disorder, unspecified: Secondary | ICD-10-CM

## 2019-05-30 MED ORDER — AMLODIPINE BESYLATE 5 MG PO TABS: 5.0000 mg | ORAL_TABLET | Freq: Every day | ORAL | 0 refills | Status: DC

## 2019-05-30 NOTE — Progress Notes (Signed)
Subjective:    Patient ID: Morgan Bowen, female    DOB: Jul 26, 1936, 83 y.o.   MRN: [id number]  HPI  Morgan Bowen is an 83 year old female with a history of hypertension, type 2 diabetes, anxiety and depression, osteoporosis who presents today for follow up of hypertension.  She was last evaluated on 05/15/19 with reports of elevated BP readings in the 150's- 200's/90's-100's. She endorsed compliance to her irbesartan 300 mg and diltiazem ER 300 mg, and metoprolol succinate 25 mg daily. BP during that visit had reduced to 151/74, she wasn't checking home readings. Given reduction of BP during her visit and the fact that she wasn't monitoring home readings she was encouraged to start monitoring and follow up today.  Since her last visit she's checking her BP at home which is running high 140's-160's/60's-70's, HR of 60's-80's. She denies dizziness, chest pain, visual changes. She is compliant to all medications. She's  been on diltiazem for years for BP control.   She is also managed on Zoloft 25 mg, is taking 12.5 mg daily but hasn't noticed any improvement in anxiety symptoms. This is a tough time of year as her husband passed away 5 years ago in the Fall. She will start increasing to 25 mg.   BP Readings from Last 3 Encounters:  05/30/19 (!) 162/64  05/15/19 (!) 184/80  03/30/19 136/80     Review of Systems  Eyes: Negative for visual disturbance.  Respiratory: Negative for shortness of breath.   Cardiovascular: Negative for chest pain.  Neurological: Negative for dizziness and headaches.       Past Medical History:  Diagnosis Date  . Acute cystitis 05/23/2018  . Anxiety   . Arthritis   . Cellulitis 12/13/2018  . Chickenpox   . Diabetes mellitus without complication ([place])   . GERD (gastroesophageal reflux disease)   . Hypertension   . Osteoporosis   . Type 2 diabetes mellitus ([place])   . Urinary tract infection      Social History   Socioeconomic History  . Marital status:  Widowed    Spouse name: Not on file  . Number of children: Not on file  . Years of education: Not on file  . Highest education level: Not on file  Occupational History  . Not on file  Social Needs  . Financial resource strain: Not hard at all  . Food insecurity    Worry: Never true    Inability: Never true  . Transportation needs    Medical: No    Non-medical: No  Tobacco Use  . Smoking status: Never Smoker  . Smokeless tobacco: Never Used  Substance and Sexual Activity  . Alcohol use: No    Frequency: Never  . Drug use: No  . Sexual activity: Not Currently  Lifestyle  . Physical activity    Days per week: 7 days    Minutes per session: 30 min  . Stress: Not at all  Relationships  . Social Herbalist on phone: Not on file    Gets together: Not on file    Attends religious service: Not on file    Active member of club or organization: Not on file    Attends meetings of clubs or organizations: Not on file    Relationship status: Not on file  . Intimate partner violence    Fear of current or ex partner: No    Emotionally abused: No    Physically abused: No  Forced sexual activity: No  Other Topics Concern  . Not on file  Social History Narrative   ** Merged History Encounter **       Widowed. Moved from [place]. 1 son, 3 grandchildren. Retired. Once worked as a Agricultural engineer.     Past Surgical History:  Procedure Laterality Date  . ABDOMINAL HYSTERECTOMY  1984  . ABDOMINAL HYSTERECTOMY    . APPENDECTOMY  1958  . CHOLECYSTECTOMY    . GALLBLADDER SURGERY    . MANDIBLE FRACTURE SURGERY  07/13/2016  . TONSILLECTOMY  1958  . TONSILLECTOMY    . WRIST FRACTURE SURGERY Right    2000    Family History  Problem Relation Age of Onset  . Hypertension Mother   . Stroke Mother   . Stroke Father   . Hypertension Father   . Hypertension Sister   . Kidney disease Sister   . Breast cancer Neg Hx     Allergies  Allergen Reactions  .  Sulfamethoxazole-Trimethoprim   . Penicillins Rash    Current Outpatient Medications on File Prior to Visit  Medication Sig Dispense Refill  . diclofenac sodium (VOLTAREN) 1 % GEL     . diltiazem (TIAZAC) 300 MG 24 hr capsule Take 1 capsule by mouth once daily for blood pressure. 90 capsule 3  . glimepiride (AMARYL) 2 MG tablet Take 1.5 tablets (3 mg total) by mouth daily with breakfast. 135 tablet 3  . glucose blood (ONE TOUCH ULTRA TEST) test strip USE AS DIRECTED TEST 3 TIMES DAILY E11.9 100 each 2  . irbesartan (AVAPRO) 300 MG tablet Take 1 tablet (300 mg total) by mouth daily. 90 tablet 3  . metFORMIN (GLUCOPHAGE) 500 MG tablet Take 1 tablet by mouth twice daily with food for diabetes. 180 tablet 1  . metoprolol succinate (TOPROL-XL) 25 MG 24 hr tablet Take 1 tablet (25 mg total) by mouth daily. For blood pressure. 90 tablet 3  . sertraline (ZOLOFT) 25 MG tablet Take 1 tablet (25 mg total) by mouth daily. For anxiety. 30 tablet 1  . Vitamin D, Ergocalciferol, (DRISDOL) 1.25 MG (50000 UT) CAPS capsule TAKE ONE CAPSULE BY MOUTH ONE TIME PER WEEK 12 capsule 3   No current facility-administered medications on file prior to visit.     BP (!) 162/64   Pulse 66   Temp 98.3 F (36.8 C) (Temporal)   Wt 129 lb (58.5 kg)   SpO2 98%   BMI 21.47 kg/m    Objective:   Physical Exam  Constitutional: She appears well-nourished.  Neck: Neck supple.  Cardiovascular: Normal rate and regular rhythm.  Respiratory: Effort normal and breath sounds normal.  Skin: Skin is warm and dry.  Psychiatric: She has a normal mood and affect.           Assessment & Plan:

## 2019-05-30 NOTE — Progress Notes (Signed)
   Subjective:    Patient ID: Morgan Bowen, female    DOB: 1935/10/27, 83 y.o.   MRN: [id number]  HPI  Morgan Bowen is an 83 year old female with a history of hypertension, type 2 diabetes, anxiety and depression, decreased renal function who presents today for a follow-up of her hypertension.   She was last seen 05/15/19 for elevated home BP readings, at the time she was also initiated on Zoloft 25 mg daily for anxiety and depression.   She is currently managed on Diltiazem 300 mg, Irbesartan 300 mg and Metoprolol 25 mg.   1) Hypertension: Since her last visit she has had home BP readings ranging from 140's/60s-160s/70s. HR 60s-80s. She denies chest pain, dizziness, headaches, vision changes.    2) Anxiety and Depression: She was started on Zoloft 25 mg on 05/15/19. She states she hasn't noticed much of a difference, she has only been taking a half tablet, 12.5 mg daily. She states overall this is a difficult time of year for her as her husband died five years ago during this season. She reports feeling lonely since she moved to this area two years ago to be close to her son, who she sees less frequently now due to Covid-19. Although she reports some sadness, she states she is managing overall. She denies GI upset, headaches, SI/HI.   BP Readings from Last 3 Encounters:  05/30/19 (!) 162/64  05/15/19 (!) 184/80  03/30/19 136/80    Review of Systems  Eyes: Negative for visual disturbance.  Respiratory: Negative for shortness of breath.   Cardiovascular: Negative for chest pain.  Gastrointestinal: Negative for diarrhea, nausea and vomiting.  Neurological: Negative for dizziness and headaches.       Objective:   Physical Exam Cardiovascular:     Rate and Rhythm: Normal rate and regular rhythm.     Heart sounds: Normal heart sounds.  Pulmonary:     Effort: Pulmonary effort is normal.     Breath sounds: Normal breath sounds.  Neurological:     Mental Status: She is alert.           Assessment & Plan:

## 2019-05-30 NOTE — Assessment & Plan Note (Addendum)
BP remains above goal in office today and with home BP readings.   Will stop Diltiazem 300 mg as this doesn't seem to be doing much for hypertension, plus already on metoprolol. Start Amlodipine 5mg  tablet daily. If BP remains elevated (>150/90) after one week, ok to increase dose to 10 mg daily (two tablets).   Continue Irbesartan 300 mg tablet and Metoprolol 25 mg tablet daily.   She will continue to monitor blood pressure at home.   Follow-up in two weeks for a blood pressure check.

## 2019-05-30 NOTE — Progress Notes (Deleted)
Acute Office Visit  Subjective:    Patient ID: Morgan Bowen, female    DOB: 02-06-1936, 83 y.o.   MRN: [id number]  Chief Complaint  Patient presents with  . Anxiety    2 wk--f/u  . Hypertension    2 wf f/u-- pt reports she is taking medication in AM and checking BP after lunch time around 2 pm.... Denies CP/tightness, SOB, paloitations, headaches or dizziness    HPI Patient is in today for ***  Past Medical History:  Diagnosis Date  . Acute cystitis 05/23/2018  . Anxiety   . Arthritis   . Cellulitis 12/13/2018  . Chickenpox   . Diabetes mellitus without complication ([place])   . GERD (gastroesophageal reflux disease)   . Hypertension   . Osteoporosis   . Type 2 diabetes mellitus ([place])   . Urinary tract infection     Past Surgical History:  Procedure Laterality Date  . ABDOMINAL HYSTERECTOMY  1984  . ABDOMINAL HYSTERECTOMY    . APPENDECTOMY  1958  . CHOLECYSTECTOMY    . GALLBLADDER SURGERY    . MANDIBLE FRACTURE SURGERY  07/13/2016  . TONSILLECTOMY  1958  . TONSILLECTOMY    . WRIST FRACTURE SURGERY Right    2000    Family History  Problem Relation Age of Onset  . Hypertension Mother   . Stroke Mother   . Stroke Father   . Hypertension Father   . Hypertension Sister   . Kidney disease Sister   . Breast cancer Neg Hx     Social History   Socioeconomic History  . Marital status: Widowed    Spouse name: Not on file  . Number of children: Not on file  . Years of education: Not on file  . Highest education level: Not on file  Occupational History  . Not on file  Social Needs  . Financial resource strain: Not hard at all  . Food insecurity    Worry: Never true    Inability: Never true  . Transportation needs    Medical: No    Non-medical: No  Tobacco Use  . Smoking status: Never Smoker  . Smokeless tobacco: Never Used  Substance and Sexual Activity  . Alcohol use: No    Frequency: Never  . Drug use: No  . Sexual activity: Not Currently  Lifestyle   . Physical activity    Days per week: 7 days    Minutes per session: 30 min  . Stress: Not at all  Relationships  . Social Herbalist on phone: Not on file    Gets together: Not on file    Attends religious service: Not on file    Active member of club or organization: Not on file    Attends meetings of clubs or organizations: Not on file    Relationship status: Not on file  . Intimate partner violence    Fear of current or ex partner: No    Emotionally abused: No    Physically abused: No    Forced sexual activity: No  Other Topics Concern  . Not on file  Social History Narrative   ** Merged History Encounter **       Widowed. Moved from [place]. 1 son, 3 grandchildren. Retired. Once worked as a Agricultural engineer.     Outpatient Medications Prior to Visit  Medication Sig Dispense Refill  . diclofenac sodium (VOLTAREN) 1 % GEL     . diltiazem (TIAZAC) 300 MG 24  hr capsule Take 1 capsule by mouth once daily for blood pressure. 90 capsule 3  . glimepiride (AMARYL) 2 MG tablet Take 1.5 tablets (3 mg total) by mouth daily with breakfast. 135 tablet 3  . glucose blood (ONE TOUCH ULTRA TEST) test strip USE AS DIRECTED TEST 3 TIMES DAILY E11.9 100 each 2  . irbesartan (AVAPRO) 300 MG tablet Take 1 tablet (300 mg total) by mouth daily. 90 tablet 3  . metFORMIN (GLUCOPHAGE) 500 MG tablet Take 1 tablet by mouth twice daily with food for diabetes. 180 tablet 1  . metoprolol succinate (TOPROL-XL) 25 MG 24 hr tablet Take 1 tablet (25 mg total) by mouth daily. For blood pressure. 90 tablet 3  . sertraline (ZOLOFT) 25 MG tablet Take 1 tablet (25 mg total) by mouth daily. For anxiety. 30 tablet 1  . Vitamin D, Ergocalciferol, (DRISDOL) 1.25 MG (50000 UT) CAPS capsule TAKE ONE CAPSULE BY MOUTH ONE TIME PER WEEK 12 capsule 3   No facility-administered medications prior to visit.     Allergies  Allergen Reactions  . Sulfamethoxazole-Trimethoprim   . Penicillins Rash    ROS      Objective:    Physical Exam  BP (!) 162/64   Pulse 66   Temp 98.3 F (36.8 C) (Temporal)   Wt 129 lb (58.5 kg)   SpO2 98%   BMI 21.47 kg/m  Wt Readings from Last 3 Encounters:  05/30/19 129 lb (58.5 kg)  05/15/19 128 lb (58.1 kg)  03/30/19 128 lb (58.1 kg)    There are no preventive care reminders to display for this patient.  There are no preventive care reminders to display for this patient.   No results found for: TSH Lab Results  Component Value Date   WBC 6.4 03/27/2019   HGB 12.6 03/27/2019   HCT 36.9 03/27/2019   MCV 93.1 03/27/2019   PLT 216.0 03/27/2019   Lab Results  Component Value Date   NA 135 03/27/2019   K 4.9 03/27/2019   CO2 27 03/27/2019   GLUCOSE 163 (H) 03/27/2019   BUN 14 03/27/2019   CREATININE 0.95 03/27/2019   BILITOT 0.6 03/27/2019   ALKPHOS 66 03/27/2019   AST 17 03/27/2019   ALT 16 03/27/2019   PROT 7.7 03/27/2019   ALBUMIN 4.1 03/27/2019   CALCIUM 9.5 03/27/2019   ANIONGAP 10 06/05/2018   GFR 56.44 (L) 03/27/2019   Lab Results  Component Value Date   CHOL 204 (H) 03/27/2019   Lab Results  Component Value Date   HDL 46.80 03/27/2019   Lab Results  Component Value Date   LDLCALC 119 (H) 03/27/2019   Lab Results  Component Value Date   TRIG 190.0 (H) 03/27/2019   Lab Results  Component Value Date   CHOLHDL 4 03/27/2019   Lab Results  Component Value Date   HGBA1C 8.1 (H) 03/27/2019       Assessment & Plan:   Problem List Items Addressed This Visit    None       No orders of the defined types were placed in this encounter.    Lenna Sciara, RN

## 2019-05-30 NOTE — Patient Instructions (Addendum)
STOP taking your Diltiazem 300 mg capsule.   START taking Amlodipine 5 mg tablet daily for your blood pressure. If after a week, you continue to have elevated blood pressure readings (>150/90), please increase you dose to 2 tablets (10 mg) daily.   Continue taking your Irbesartan 300 mg tablet and Metoprolol 25 mg tablet daily for blood pressure.   Continue to take your Sertraline (Zoloft) 25 mg tablet daily for anxiety and depression.   Ensure you are consuming 64 ounces of water daily.  Please schedule a follow-up appointment with me in 2 weeks to re-evaluate your blood pressure.   It was a pleasure to see you today!

## 2019-05-30 NOTE — Assessment & Plan Note (Signed)
Chronic, ongoing, stable. Was initiated on Sertraline 25 mg tablet daily on 05/15/19. She has only been taking 12.5 mg daily.  Continue Sertraline 25 mg tablet daily. I instructed her to take the full dose.   Follow-up in two weeks.

## 2019-05-31 ENCOUNTER — Telehealth: Payer: Self-pay | Admitting: Primary Care

## 2019-05-31 NOTE — Telephone Encounter (Signed)
Spoken to patient and she did up the new Rx. Will take them tonight. BP check on Friday 06/01/2019

## 2019-05-31 NOTE — Telephone Encounter (Signed)
Spoke with patient, states that she checked her BP this morning (routine check) and it was elevated at 191/91, HR in 90s. Pt did go to the [employer] and had them check her BP to make sure that her machine was reading correctly. Pt was asymptomatic. BP reading [facility] was 190/92, HR 92 Pt denies Headache, CP, chest discomfort, SOB or visual disturbance.  Pt states that she started a new vitamin this morning called Omega Plus and is not sure if this could have make her BP become elevated. Pt states that at her OV with Gentry Fitz, NP on 05/30/2019 she was advised to start a new BP medication but she has not started this yet, planned to start it this evening with her other medications.   Pt taking all her current medications as directed per last OV - She takes the Irbesartan and Metoprolol at night and plans to start the Amlodipine this evening as well in addition.   Instructions  05/30/2019 STOP taking your Diltiazem 300 mg capsule.   START taking Amlodipine 5 mg tablet daily for your blood pressure. If after a week, you continue to have elevated blood pressure readings (>150/90), please increase you dose to 2 tablets (10 mg) daily.   Continue taking your Irbesartan 300 mg tablet and Metoprolol 25 mg tablet daily for blood pressure.   Continue to take your Sertraline (Zoloft) 25 mg tablet daily for anxiety and depression.   Ensure you are consuming 64 ounces of water daily.  Please schedule a follow-up appointment with me in 2 weeks to re-evaluate your blood pressure.   It was a pleasure to see you today!     Please advise, thanks.

## 2019-05-31 NOTE — Telephone Encounter (Signed)
Noted. Please call to check on patient today before the end of the day, also can we check on her tomorrow?

## 2019-06-01 ENCOUNTER — Ambulatory Visit: Payer: Medicare Other | Admitting: *Deleted

## 2019-06-01 ENCOUNTER — Other Ambulatory Visit: Payer: Self-pay

## 2019-06-01 VITALS — BP 164/90 | HR 83 | Temp 98.0°F

## 2019-06-01 DIAGNOSIS — Z7984 Long term (current) use of oral hypoglycemic drugs: Secondary | ICD-10-CM | POA: Insufficient documentation

## 2019-06-01 DIAGNOSIS — I10 Essential (primary) hypertension: Secondary | ICD-10-CM | POA: Insufficient documentation

## 2019-06-01 DIAGNOSIS — F419 Anxiety disorder, unspecified: Secondary | ICD-10-CM | POA: Diagnosis not present

## 2019-06-01 DIAGNOSIS — E119 Type 2 diabetes mellitus without complications: Secondary | ICD-10-CM | POA: Diagnosis not present

## 2019-06-01 DIAGNOSIS — Z79899 Other long term (current) drug therapy: Secondary | ICD-10-CM | POA: Diagnosis not present

## 2019-06-01 NOTE — Progress Notes (Signed)
Per Allie Bossier encounter order on 05/31/2019, patient presents today for a nurse visit blood pressure check for ongoing follow up and management.  Vital Sign Readings today BP 164/90 P 83.

## 2019-06-01 NOTE — ED Triage Notes (Addendum)
Patient reports recently changed blood pressure medication.  Reports took blood pressure about and hour after taking it for the first time and it was elevate and has remained elevated.  Denies accompanying symptoms.

## 2019-06-02 ENCOUNTER — Emergency Department: Payer: Medicare Other

## 2019-06-02 ENCOUNTER — Emergency Department
Admission: EM | Admit: 2019-06-02 | Discharge: 2019-06-02 | Disposition: A | Discharge status: Home / Self Care | Payer: Medicare Other | Attending: Emergency Medicine | Admitting: Emergency Medicine

## 2019-06-02 DIAGNOSIS — I10 Essential (primary) hypertension: Secondary | ICD-10-CM | POA: Diagnosis not present

## 2019-06-02 DIAGNOSIS — F419 Anxiety disorder, unspecified: Secondary | ICD-10-CM

## 2019-06-02 LAB — COMPREHENSIVE METABOLIC PANEL
ALT: 19 U/L (ref 0–44)
AST: 22 U/L (ref 15–41)
Albumin: 4.4 g/dL (ref 3.5–5.0)
Alkaline Phosphatase: 81 U/L (ref 38–126)
Anion gap: 14 (ref 5–15)
BUN: 16 mg/dL (ref 8–23)
CO2: 23 mmol/L (ref 22–32)
Calcium: 9.6 mg/dL (ref 8.9–10.3)
Chloride: 102 mmol/L (ref 98–111)
Creatinine, Ser: 0.9 mg/dL (ref 0.44–1.00)
GFR calc Af Amer: >60 mL/min (ref >60)
GFR calc non Af Amer: 60 mL/min — ABNORMAL LOW (ref >60)
Glucose, Bld: 221 mg/dL — ABNORMAL HIGH (ref 70–99)
Potassium: 4.5 mmol/L (ref 3.5–5.1)
Sodium: 139 mmol/L (ref 135–145)
Total Bilirubin: 0.5 mg/dL (ref 0.3–1.2)
Total Protein: 8.7 g/dL — ABNORMAL HIGH (ref 6.5–8.1)

## 2019-06-02 LAB — URINALYSIS, COMPLETE (UACMP) WITH MICROSCOPIC
Bacteria, UA: NONE SEEN
Bilirubin Urine: NEGATIVE
Glucose, UA: NEGATIVE mg/dL
Hgb urine dipstick: NEGATIVE
Ketones, ur: NEGATIVE mg/dL
Leukocytes,Ua: NEGATIVE
Nitrite: NEGATIVE
Protein, ur: NEGATIVE mg/dL
Specific Gravity, Urine: 1.002 — ABNORMAL LOW (ref 1.005–1.030)
Squamous Epithelial / HPF: NONE SEEN (ref 0–5)
pH: 7 (ref 5.0–8.0)

## 2019-06-02 LAB — CBC WITH DIFFERENTIAL/PLATELET
Abs Immature Granulocytes: 0.05 10*3/uL (ref 0.00–0.07)
Basophils Absolute: 0.1 10*3/uL (ref 0.0–0.1)
Basophils Relative: 1 %
Eosinophils Absolute: 0.2 10*3/uL (ref 0.0–0.5)
Eosinophils Relative: 2 %
HCT: 38.2 % (ref 36.0–46.0)
Hemoglobin: 13 g/dL (ref 12.0–15.0)
Immature Granulocytes: 1 %
Lymphocytes Relative: 28 %
Lymphs Abs: 2.7 10*3/uL (ref 0.7–4.0)
MCH: 31.3 pg (ref 26.0–34.0)
MCHC: 34 g/dL (ref 30.0–36.0)
MCV: 91.8 fL (ref 80.0–100.0)
Monocytes Absolute: 0.7 10*3/uL (ref 0.1–1.0)
Monocytes Relative: 7 %
Neutro Abs: 5.9 10*3/uL (ref 1.7–7.7)
Neutrophils Relative %: 61 %
Platelets: 288 10*3/uL (ref 150–400)
RBC: 4.16 MIL/uL (ref 3.87–5.11)
RDW: 12.8 % (ref 11.5–15.5)
WBC: 9.5 10*3/uL (ref 4.0–10.5)
nRBC: 0 % (ref 0.0–0.2)

## 2019-06-02 LAB — TROPONIN I (HIGH SENSITIVITY): Troponin I (High Sensitivity): 4 ng/L (ref <18)

## 2019-06-02 MED ORDER — LORAZEPAM 0.5 MG PO TABS: 0.5000 mg | ORAL_TABLET | Freq: Every day | ORAL | 0 refills | Status: AC | PRN

## 2019-06-02 MED ORDER — LORAZEPAM 0.5 MG PO TABS
0.5000 mg | ORAL_TABLET | Freq: Once | ORAL | Status: AC
  Administered 2019-06-02: 0.5 mg via ORAL
  Filled 2019-06-02: qty 1

## 2019-06-02 MED ORDER — CLONIDINE HCL 0.1 MG PO TABS
0.1000 mg | ORAL_TABLET | Freq: Once | ORAL | Status: AC
  Administered 2019-06-02: 04:00:00 0.1 mg via ORAL
  Filled 2019-06-02: qty 1

## 2019-06-02 NOTE — ED Notes (Signed)
No peripheral IV placed this visit.   Discharge instructions reviewed with patient. Questions fielded by this RN. Patient verbalizes understanding of instructions. Patient discharged home in stable condition per paduchowski. No acute distress noted at time of discharge.   

## 2019-06-02 NOTE — ED Provider Notes (Signed)
[facility] Emergency Department Provider Note  Time seen: 3:07 AM  I have reviewed the triage vital signs and the nursing notes.   HISTORY  Chief Complaint Hypertension   HPI GWEN WOGOMAN is a 83 y.o. female with a past medical history of anxiety, arthritis, gastric reflux, diabetes, hypertension, presents to the emergency department for hypertension.  According to the patient her blood pressure has been running high lately, she recently saw her doctor who changed one of her medications and added amlodipine.  States she was going to check it tonight before and after taking amlodipine to see how well the amlodipine would work and states her blood pressure remained over 200 both before and after.  States she called her nurse hotline who informed her to go to the emergency department.  Here the patient appears well.  States she feels anxious has a history of anxiety was taking Ativan until 1 year ago.  Feels like this could all be related to anxiety.  Denies any chest pain or headache at any point.  Patient's blood pressure is currently 213/82.   Past Medical History:  Diagnosis Date  . Acute cystitis 05/23/2018  . Anxiety   . Arthritis   . Cellulitis 12/13/2018  . Chickenpox   . Diabetes mellitus without complication ([place])   . GERD (gastroesophageal reflux disease)   . Hypertension   . Osteoporosis   . Type 2 diabetes mellitus ([place])   . Urinary tract infection     Patient Active Problem List   Diagnosis Date Noted  . Pain due to onychomycosis of toenails of both feet 01/22/2019  . Vitamin D deficiency 06/26/2018  . Decreased renal function 03/21/2018  . Non-traumatic compression fracture of lumbar vertebra with routine healing 08/26/2017  . Anxiety and depression 08/26/2017  . Type 2 diabetes mellitus ([place]) 04/27/2017  . Essential hypertension 04/27/2017  . Osteoporosis 04/27/2017    Past Surgical History:  Procedure Laterality Date  . ABDOMINAL  HYSTERECTOMY  1984  . ABDOMINAL HYSTERECTOMY    . APPENDECTOMY  1958  . CHOLECYSTECTOMY    . GALLBLADDER SURGERY    . MANDIBLE FRACTURE SURGERY  07/13/2016  . TONSILLECTOMY  1958  . TONSILLECTOMY    . WRIST FRACTURE SURGERY Right    2000    Prior to Admission medications   Medication Sig Start Date End Date Taking? Authorizing Provider  amLODipine (NORVASC) 5 MG tablet Take 1 tablet (5 mg total) by mouth daily. For blood pressure. 05/30/19   [name], NP  diclofenac sodium (VOLTAREN) 1 % GEL  03/22/18   [provider]  glimepiride (AMARYL) 2 MG tablet Take 1.5 tablets (3 mg total) by mouth daily with breakfast. 03/30/19   [name], NP  glucose blood (ONE TOUCH ULTRA TEST) test strip USE AS DIRECTED TEST 3 TIMES DAILY E11.9 01/18/19   [name], NP  irbesartan (AVAPRO) 300 MG tablet Take 1 tablet (300 mg total) by mouth daily. 07/17/18   [name], NP  metFORMIN (GLUCOPHAGE) 500 MG tablet Take 1 tablet by mouth twice daily with food for diabetes. 01/31/19   [name], NP  metoprolol succinate (TOPROL-XL) 25 MG 24 hr tablet Take 1 tablet (25 mg total) by mouth daily. For blood pressure. 07/17/18   [name], NP  sertraline (ZOLOFT) 25 MG tablet Take 1 tablet (25 mg total) by mouth daily. For anxiety. 05/15/19   [name], NP  Vitamin D, Ergocalciferol, (DRISDOL)  1.25 MG (50000 UT) CAPS capsule TAKE ONE CAPSULE BY MOUTH ONE TIME PER WEEK 10/04/18   [name], NP    Allergies  Allergen Reactions  . Sulfamethoxazole-Trimethoprim   . Penicillins Rash    Family History  Problem Relation Age of Onset  . Hypertension Mother   . Stroke Mother   . Stroke Father   . Hypertension Father   . Hypertension Sister   . Kidney disease Sister   . Breast cancer Neg Hx     Social History Social History   Tobacco Use  . Smoking status: Never Smoker  . Smokeless tobacco: Never Used  Substance Use Topics  . Alcohol  use: No    Frequency: Never  . Drug use: No    Review of Systems Constitutional: Negative for fever. Cardiovascular: Negative for chest pain. Respiratory: Negative for shortness of breath. Gastrointestinal: Negative for abdominal pain Musculoskeletal: Negative for musculoskeletal complaints Neurological: Negative for headache All other ROS negative  ____________________________________________   PHYSICAL EXAM:  VITAL SIGNS: ED Triage Vitals  Enc Vitals Group     BP 06/01/19 2345 (!) 213/82     Pulse Rate 06/01/19 2345 (!) 105     Resp 06/01/19 2345 18     Temp 06/01/19 2345 98.2 F (36.8 C)     Temp Source 06/01/19 2345 Oral     SpO2 06/01/19 2345 97 %     Weight 06/01/19 2344 129 lb (58.5 kg)     Height 06/01/19 2344 5\' 5"  (1.651 m)     Head Circumference --      Peak Flow --      Pain Score 06/01/19 2344 0     Pain Loc --      Pain Edu? --      Excl. in [place]? --    Constitutional: Alert and oriented. Well appearing and in no distress. Eyes: Normal exam ENT      Head: Normocephalic and atraumatic.      Mouth/Throat: Mucous membranes are moist. Cardiovascular: Normal rate, regular rhythm.  Respiratory: Normal respiratory effort without tachypnea nor retractions. Breath sounds are clear Gastrointestinal: Soft and nontender. No distention.  Musculoskeletal: Nontender with normal range of motion in all extremities.  Neurologic:  Normal speech and language. No gross focal neurologic deficits Skin:  Skin is warm, dry and intact.  Psychiatric: Mood and affect are normal.   ____________________________________________    EKG  EKG viewed and interpreted by myself shows sinus tachycardia 105 bpm with a narrow QRS, normal axis, normal intervals, no concerning ST changes.  ____________________________________________    RADIOLOGY  Chest x-ray negative  ____________________________________________   INITIAL IMPRESSION / ASSESSMENT AND PLAN / ED  COURSE  Pertinent labs & imaging results that were available during my care of the patient were reviewed by me and considered in my medical decision making (see chart for details).   Patient presents emergency department for hypertension greater than A999333 systolic at home.  Remains greater than A999333 saw systolic in the emergency department.  Overall the patient appears well, no distress.  No medical complaints including no chest pain or headache.    Lab work is largely reassuring as well including a negative troponin and normal creatinine.  Patient remains quite hypertensive.  We will dose a one-time dose of 0.1 mg of clonidine.  Patient also states she used to take a low-dose of Ativan which helped her significantly previously.  We will dose 0.5 mg of Ativan as well and reassess.  Patient agreeable to plan of care.  Patient's blood pressure currently in the [id number] systolic during my last evaluation.  Overall the patient appears extremely well.  Do not believe the patient would need clonidine but I do believe the patient would benefit from a small course of Ativan to be used only if needed for significant anxiety.  Patient agreeable to plan of care and will follow up with her doctor.  KYMBERLYNN PROWANT was evaluated in Emergency Department on 06/02/2019 for the symptoms described in the history of present illness. She was evaluated in the context of the global COVID-19 pandemic, which necessitated consideration that the patient might be at risk for infection with the SARS-CoV-2 virus that causes COVID-19. Institutional protocols and algorithms that pertain to the evaluation of patients at risk for COVID-19 are in a state of rapid change based on information released by regulatory bodies including the CDC and federal and state organizations. These policies and algorithms were followed during the patient's care in the ED.  ____________________________________________   FINAL CLINICAL IMPRESSION(S) / ED  DIAGNOSES  Hypertension Anxiety   Harvest Dark, MD 06/02/19 [phone]

## 2019-06-04 ENCOUNTER — Ambulatory Visit
Admission: RE | Admit: 2019-06-04 | Discharge: 2019-06-04 | Disposition: A | Discharge status: Home / Self Care | Payer: Medicare Other | Source: Ambulatory Visit | Attending: Primary Care | Admitting: Primary Care

## 2019-06-04 ENCOUNTER — Telehealth: Payer: Self-pay

## 2019-06-04 DIAGNOSIS — Z1231 Encounter for screening mammogram for malignant neoplasm of breast: Secondary | ICD-10-CM | POA: Diagnosis not present

## 2019-06-04 NOTE — Telephone Encounter (Signed)
Per DPR, left detail message of Kate Clark's comments for patient to call back 

## 2019-06-04 NOTE — Telephone Encounter (Signed)
Per chart review tab pt went to [facility] ED on 06/02/19.

## 2019-06-04 NOTE — Telephone Encounter (Signed)
Please notify patient that I reviewed her chart from the ED visit over the weekend. Have her continue with the prescribed BP medications we discussed. I see that she was prescribed lorazepam which should only be used very sparingly. The 20 tablets should last for at least 6 months.   How's her BP running today?

## 2019-06-04 NOTE — Telephone Encounter (Signed)
Spoken and notified patient of Morgan Millers comments. Patient verbalized understanding.  BP reading today  173/85 166/80 188/88  Patient has appointment on 06/11/2019

## 2019-06-04 NOTE — Telephone Encounter (Signed)
[facility] Night - Client TELEPHONE ADVICE RECORD [facility] Patient Name: Morgan Bowen Gender: Female DOB: 1935-09-24 Age: 83 Y 81 M 19 D Return Phone Number: [phone] (Primary), [phone] (Secondary) Address: [id number] City/State/Zip: [place] [address] Client [facility] Night - Client Client Site [place] Physician [name] - NP Contact Type Call Who Is Calling Patient / Member / Family / Caregiver Call Type Triage / Clinical Relationship To Patient Self Return Phone Number [phone] (Primary) Chief Complaint Blood Pressure High Reason for Call Symptomatic / Request for [place] states she has been calling [facility], she changed her BP medication. Her current reading is 191/96. Translation No Nurse Assessment Nurse: Durene Cal, RN, Brandi Date/Time (Eastern Time): 06/01/2019 10:32:09 PM Confirm and document reason for call. If symptomatic, describe symptoms. ---Caller reports md changed her bp medication to AMLODIPINE 5mg  1 tab for 6days then increase dose to 10mg  qd. Current bp: 191/96 pulse: 90. C/O headache. Has the patient had close contact with a person known or suspected to have the novel coronavirus illness OR traveled / lives in area with major community spread (including international travel) in the last 14 days from the onset of symptoms? * If Asymptomatic, screen for exposure and travel within the last 14 days. ---No Does the patient have any new or worsening symptoms? ---Yes Will a triage be completed? ---Yes Related visit to physician within the last 2 weeks? ---Yes Does the PT have any chronic conditions? (i.e. diabetes, asthma, this includes High risk factors for pregnancy, etc.) ---Yes List chronic conditions. ---HTN, diabetic Is this a behavioral health or substance abuse call? ---No Nurse: Durene Cal, RN,  Brandi Date/Time (Eastern Time): 06/01/2019 10:52:53 PM Confirm and document reason for call. If symptomatic, describe symptoms. ---Caller measured bp at the end of call and bp elevated to 227/110, P:108 Has the patient had close contact with a person known or suspected to have the novel coronavirus illness OR traveled / lives in area with major community spread (including international travel) in the last 14 days from the onset of symptoms? * If Asymptomatic, screen for exposure and travel within the last 14 days. ---No PLEASE NOTE: All timestamps contained within this report are represented as [place] Standard Time. CONFIDENTIALTY NOTICE: This fax transmission is intended only for the addressee. It contains information that is legally privileged, confidential or otherwise protected from use or disclosure. If you are not the intended recipient, you are strictly prohibited from reviewing, disclosing, copying using or disseminating any of this information or taking any action in reliance on or regarding this information. If you have received this fax in error, please notify us immediately by telephone so that we can arrange for its return to [place]. Phone: [phone], Toll-Free: [phone], Fax: [phone] Page: 2 of 3 Call Id: [id number] Nurse Assessment Does the patient have any new or worsening symptoms? ---Yes Will a triage be completed? ---Yes Related visit to physician within the last 2 weeks? ---Yes Does the PT have any chronic conditions? (i.e. diabetes, asthma, this includes High risk factors for pregnancy, etc.) ---Yes List chronic conditions. ---HTN and diabetic Is this a behavioral health or substance abuse call? ---No Guidelines Guideline Title Affirmed Question Affirmed Notes Nurse Date/Time (Eastern Time) High Blood Pressure [id number] Systolic BP >= [id number] OR Diastolic >= 80 AND A999333 taking BP medications Durene Cal, RN, Brandi 06/01/2019 10:35:25 PM High Blood Pressure [id number]  Systolic BP >=  0000000 OR Diastolic >= [id number] AND A999333 cardiac or neurologic symptoms (e.g., chest pain, difficulty breathing, unsteady gait, blurred vision) Durene Cal, RN, Brandi 06/01/2019 10:54:35 PM Disp. Time Eilene Ghazi Time) Disposition Final User 06/01/2019 10:47:04 PM See PCP within 2 [place], RN, Velna Hatchet 06/01/2019 10:56:23 PM Go to ED Now Yes Durene Cal, RN, Berdie Ogren Disagree/Comply Comply Caller Understands Yes PreDisposition Go to ED Care Advice Given Per Guideline SEE PCP WITHIN 2 WEEKS: * You need to be seen for this ongoing problem within the next 2 weeks. Call your doctor (or NP/ PA) during regular office hours and make an appointment. REASSURANCE AND EDUCATION: * Your blood pressure is elevated but you have told me that you are not having any symptoms. CALL BACK IF: * Weakness or numbness of the face, arm or leg on one side of the body occurs * Difficulty walking, difficulty talking, or severe headache occurs * Chest pain or difficulty breathing occurs * Your blood pressure is over 160/100 * You become worse. * Untreated hypertension may cause damage to the heart, brain, kidneys, and eyes. * It is important to take prescribed medications as directed. GO TO ED NOW: * You need to be seen in the Emergency Department. * Go to the ED at ___________ [place] now. Drive carefully. * Another adult should drive. CALL EMS 911 IF: * Patient passes out, starts acting confused or becomes too weak to stand. * You become worse. PLEASE NOTE: All timestamps contained within this report are represented as [place] Standard Time. CONFIDENTIALTY NOTICE: This fax transmission is intended only for the addressee. It contains information that is legally privileged, confidential or otherwise protected from use or disclosure. If you are not the intended recipient, you are strictly prohibited from reviewing, disclosing, copying using or disseminating any of this information or taking  any action in reliance on or regarding this information. If you have received this fax in error, please notify us immediately by telephone so that we can arrange for its return to [place]. Phone: [phone], Toll-Free: [phone], Fax: [phone] Page: 3 of 3 Call Id: [id number] Comments User: Ermalene Postin, RN Date/Time Eilene Ghazi Time): 06/01/2019 10:52:34 PM Encouraged caller to retake bp :227/110, Pulse: 108 Referrals [facility] - ED

## 2019-06-04 NOTE — Telephone Encounter (Signed)
Noted  

## 2019-06-05 NOTE — Telephone Encounter (Signed)
Spoke with patient via phone just now, blood pressure is about the same, heart rate of 104 to 110. She is taking amlodipine 5 mg, will increase to 10 mg.  She will take her second dose now. She is compliant to other medications as prescribed.  She will recheck her blood pressure in a few hours, and also continue to closely monitor of the next couple of days. Consider increasing metoprolol to 50 mg if needed for heart rate control.  Consider as needed clonidine if needed. We will be in touch later this evening.

## 2019-06-05 NOTE — Telephone Encounter (Signed)
Pt left VM at Triage it said pt's BP is 196/83 today then she said 110 (not sure if that's pulse or not), pt request call back

## 2019-06-06 ENCOUNTER — Other Ambulatory Visit: Payer: Self-pay | Admitting: Primary Care

## 2019-06-06 DIAGNOSIS — F329 Major depressive disorder, single episode, unspecified: Secondary | ICD-10-CM

## 2019-06-06 DIAGNOSIS — F32A Depression, unspecified: Secondary | ICD-10-CM

## 2019-06-06 DIAGNOSIS — F419 Anxiety disorder, unspecified: Secondary | ICD-10-CM

## 2019-06-11 ENCOUNTER — Other Ambulatory Visit: Payer: Self-pay

## 2019-06-11 ENCOUNTER — Ambulatory Visit (INDEPENDENT_AMBULATORY_CARE_PROVIDER_SITE_OTHER): Payer: Medicare Other | Admitting: Primary Care

## 2019-06-11 VITALS — BP 148/80 | HR 78 | Temp 97.5°F | Ht 65.0 in | Wt 130.2 lb

## 2019-06-11 DIAGNOSIS — F329 Major depressive disorder, single episode, unspecified: Secondary | ICD-10-CM | POA: Diagnosis not present

## 2019-06-11 DIAGNOSIS — I10 Essential (primary) hypertension: Secondary | ICD-10-CM | POA: Diagnosis not present

## 2019-06-11 DIAGNOSIS — F419 Anxiety disorder, unspecified: Secondary | ICD-10-CM | POA: Diagnosis not present

## 2019-06-11 MED ORDER — CLONIDINE HCL 0.1 MG PO TABS: ORAL_TABLET | ORAL | 0 refills | Status: DC

## 2019-06-11 MED ORDER — AMLODIPINE BESYLATE 10 MG PO TABS: 10.0000 mg | ORAL_TABLET | Freq: Every day | ORAL | 3 refills | Status: DC

## 2019-06-11 NOTE — Patient Instructions (Signed)
Continue Amlodipine 10 mg daily for blood pressure. Continue irbesartan, metoprolol succinate for blood pressure.  Take the Clonidine if you see blood pressure readings at or above 170 on the top number.  Continue to monitor your blood pressure several times weekly as discussed.  It was a pleasure to see you today!

## 2019-06-11 NOTE — Assessment & Plan Note (Signed)
Improved with Amlodipine 10 mg, compliant to other regimen.  Continue current regimen as prescribed. I did provide her with Clonidine 0.1 mg to use PRN for systolic BP at or above 123XX123. She did well with this during her ED stay.   She will continue to monitor BP and HR, follow up PRN.

## 2019-06-11 NOTE — Assessment & Plan Note (Signed)
Improved, no longer taking Zoloft. Discussed to use lorazepam sparingly. Continue to monitor.

## 2019-06-11 NOTE — Progress Notes (Signed)
Subjective:    Patient ID: Morgan Bowen, female    DOB: 02-Sep-1935, 83 y.o.   MRN: [id number]  HPI  Morgan Bowen is a 83 year old female with a history of hypertension, type 2 diabetes, anxiety and depression who presents today for follow up of hypertension.  She's been evaluated numerous times in our office for elevated BP readings ranging from 150-190's/80's-100's. During her last visit we discontinued her diltiazem 300 mg and added Amlodipine 5 mg. We discussed to start with 5 mg then increase to 10 in several days if needed. We continued her irbesartan and metoprolol succinate.   Since her last visit she's increased her Amlodipine to 10 mg and is compliant, increased dose 6 days ago. She's been evaluated in the ED for elevated BP readings (prior to increasing Amlodipine to 10 mg) and was provided with Clonidine during her stay and then a prescription for lorazepam to use PRN.  She's monitoring her BP at home and is getting readings of 140's-160's/70's with HR in the 70's-80's. She feels much better overall. She's not taking her Zoloft or lorazepam.   She would like to have some Clonidine to use as needed for elevated BP readings as this helped during her most recent ED visit. She was told by the ED physician that he was going to provide her with a prescription for this to use at home, but she never received.  BP Readings from Last 3 Encounters:  06/11/19 (!) 148/80  06/02/19 124/60  06/01/19 (!) 164/90     Review of Systems  Eyes: Negative for visual disturbance.  Respiratory: Negative for shortness of breath.   Cardiovascular: Negative for chest pain.  Neurological: Negative for dizziness and headaches.       Past Medical History:  Diagnosis Date  . Acute cystitis 05/23/2018  . Anxiety   . Arthritis   . Cellulitis 12/13/2018  . Chickenpox   . Diabetes mellitus without complication ([place])   . GERD (gastroesophageal reflux disease)   . Hypertension   . Osteoporosis   . Type 2  diabetes mellitus ([place])   . Urinary tract infection      Social History   Socioeconomic History  . Marital status: Widowed    Spouse name: Not on file  . Number of children: Not on file  . Years of education: Not on file  . Highest education level: Not on file  Occupational History  . Not on file  Social Needs  . Financial resource strain: Not hard at all  . Food insecurity    Worry: Never true    Inability: Never true  . Transportation needs    Medical: No    Non-medical: No  Tobacco Use  . Smoking status: Never Smoker  . Smokeless tobacco: Never Used  Substance and Sexual Activity  . Alcohol use: No    Frequency: Never  . Drug use: No  . Sexual activity: Not Currently  Lifestyle  . Physical activity    Days per week: 7 days    Minutes per session: 30 min  . Stress: Not at all  Relationships  . Social Herbalist on phone: Not on file    Gets together: Not on file    Attends religious service: Not on file    Active member of club or organization: Not on file    Attends meetings of clubs or organizations: Not on file    Relationship status: Not on file  . Intimate  partner violence    Fear of current or ex partner: No    Emotionally abused: No    Physically abused: No    Forced sexual activity: No  Other Topics Concern  . Not on file  Social History Narrative   ** Merged History Encounter **       Widowed. Moved from [place]. 1 son, 3 grandchildren. Retired. Once worked as a Agricultural engineer.     Past Surgical History:  Procedure Laterality Date  . ABDOMINAL HYSTERECTOMY  1984  . ABDOMINAL HYSTERECTOMY    . APPENDECTOMY  1958  . CHOLECYSTECTOMY    . GALLBLADDER SURGERY    . MANDIBLE FRACTURE SURGERY  07/13/2016  . TONSILLECTOMY  1958  . TONSILLECTOMY    . WRIST FRACTURE SURGERY Right    2000    Family History  Problem Relation Age of Onset  . Hypertension Mother   . Stroke Mother   . Stroke Father   . Hypertension Father   .  Hypertension Sister   . Kidney disease Sister   . Breast cancer Neg Hx     Allergies  Allergen Reactions  . Sulfamethoxazole-Trimethoprim   . Penicillins Rash    Current Outpatient Medications on File Prior to Visit  Medication Sig Dispense Refill  . diclofenac sodium (VOLTAREN) 1 % GEL     . glimepiride (AMARYL) 2 MG tablet Take 1.5 tablets (3 mg total) by mouth daily with breakfast. 135 tablet 3  . glucose blood (ONE TOUCH ULTRA TEST) test strip USE AS DIRECTED TEST 3 TIMES DAILY E11.9 100 each 2  . irbesartan (AVAPRO) 300 MG tablet Take 1 tablet (300 mg total) by mouth daily. 90 tablet 3  . LORazepam (ATIVAN) 0.5 MG tablet Take 1 tablet (0.5 mg total) by mouth daily as needed for anxiety. 20 tablet 0  . metFORMIN (GLUCOPHAGE) 500 MG tablet Take 1 tablet by mouth twice daily with food for diabetes. 180 tablet 1  . metoprolol succinate (TOPROL-XL) 25 MG 24 hr tablet Take 1 tablet (25 mg total) by mouth daily. For blood pressure. 90 tablet 3  . Vitamin D, Ergocalciferol, (DRISDOL) 1.25 MG (50000 UT) CAPS capsule TAKE ONE CAPSULE BY MOUTH ONE TIME PER WEEK 12 capsule 3   No current facility-administered medications on file prior to visit.     BP (!) 148/80   Pulse 78   Temp (!) 97.5 F (36.4 C) (Temporal)   Ht 5\' 5"  (1.651 m)   Wt 130 lb 4 oz (59.1 kg)   SpO2 98%   BMI 21.67 kg/m    Objective:   Physical Exam  Constitutional: She appears well-nourished.  Neck: Neck supple.  Cardiovascular: Normal rate and regular rhythm.  Respiratory: Effort normal and breath sounds normal.  Skin: Skin is warm and dry.  Psychiatric: She has a normal mood and affect.  Appears calmer           Assessment & Plan:

## 2019-06-13 ENCOUNTER — Ambulatory Visit: Payer: Medicare Other | Admitting: Primary Care

## 2019-06-21 ENCOUNTER — Other Ambulatory Visit: Payer: Self-pay | Admitting: Primary Care

## 2019-06-21 DIAGNOSIS — I10 Essential (primary) hypertension: Secondary | ICD-10-CM

## 2019-06-26 ENCOUNTER — Telehealth: Payer: Self-pay

## 2019-06-26 NOTE — Telephone Encounter (Signed)
LVM w COVID screen and  back lab info 11.17.2020 TLJ

## 2019-06-28 ENCOUNTER — Other Ambulatory Visit: Payer: Self-pay | Admitting: Primary Care

## 2019-06-28 DIAGNOSIS — E119 Type 2 diabetes mellitus without complications: Secondary | ICD-10-CM

## 2019-07-02 ENCOUNTER — Other Ambulatory Visit (INDEPENDENT_AMBULATORY_CARE_PROVIDER_SITE_OTHER): Payer: Medicare Other

## 2019-07-02 ENCOUNTER — Other Ambulatory Visit: Payer: Self-pay

## 2019-07-02 DIAGNOSIS — E119 Type 2 diabetes mellitus without complications: Secondary | ICD-10-CM

## 2019-07-02 LAB — POCT GLYCOSYLATED HEMOGLOBIN (HGB A1C): Hemoglobin A1C: 7.9 % — AB (ref 4.0–5.6)

## 2019-07-05 ENCOUNTER — Other Ambulatory Visit: Payer: Self-pay | Admitting: Primary Care

## 2019-07-05 DIAGNOSIS — I10 Essential (primary) hypertension: Secondary | ICD-10-CM

## 2019-07-09 NOTE — Telephone Encounter (Signed)
Last prescribed on 06/11/2019 . Last appointment on 06/11/2019. Next future appointment on 10/02/2019

## 2019-07-09 NOTE — Telephone Encounter (Signed)
Message left for patient to return my call.  

## 2019-07-09 NOTE — Telephone Encounter (Signed)
Is she using the clonidine medication daily? We discussed to use only if BP was at or above 123XX123 systolic.  If so, any problems?  How is her BP running?

## 2019-07-10 NOTE — Telephone Encounter (Signed)
Patient called back. She stated that she did not asked for a refill. Patient stated BP have been excellent such as 131/64. It has been in the 130s-120s over 70s-60s. She have not been taken this Rx at all.

## 2019-07-16 ENCOUNTER — Ambulatory Visit (INDEPENDENT_AMBULATORY_CARE_PROVIDER_SITE_OTHER): Payer: Medicare Other | Admitting: Podiatry

## 2019-07-16 ENCOUNTER — Encounter: Payer: Self-pay | Admitting: Podiatry

## 2019-07-16 DIAGNOSIS — M79675 Pain in left toe(s): Secondary | ICD-10-CM | POA: Diagnosis not present

## 2019-07-16 DIAGNOSIS — E119 Type 2 diabetes mellitus without complications: Secondary | ICD-10-CM

## 2019-07-16 DIAGNOSIS — M79674 Pain in right toe(s): Secondary | ICD-10-CM | POA: Diagnosis not present

## 2019-07-16 DIAGNOSIS — B351 Tinea unguium: Secondary | ICD-10-CM | POA: Diagnosis not present

## 2019-07-16 NOTE — Progress Notes (Signed)
Complaint:  Visit Type: Patient returns to my office for continued preventative foot care services. Complaint: Patient states" my nails have grown long and thick and become painful to walk and wear shoes" Patient has been diagnosed with DM with no foot complications. The patient presents for preventative foot care services. No changes to ROS.    Podiatric Exam: Vascular: dorsalis pedis and posterior tibial pulses are palpable bilateral. Capillary return is immediate. Temperature gradient is WNL. Skin turgor WNL  Sensorium: Normal Semmes Weinstein monofilament test. Normal tactile sensation bilaterally. Nail Exam: Pt has thick disfigured discolored nails with subungual debris noted bilateral entire nail hallux Ulcer Exam: There is no evidence of ulcer or pre-ulcerative changes or infection. Orthopedic Exam: Muscle tone and strength are WNL. No limitations in general ROM. No crepitus or effusions noted. Foot type and digits show no abnormalities. Bony prominences are unremarkable. Skin: No Porokeratosis. No infection or ulcers  Diagnosis:  Onychomycosis, , Pain in right toe, pain in left toes  Midfoot arthritis.  Treatment & Plan Procedures and Treatment: Consent by patient was obtained for treatment procedures.   Debridement of mycotic and hypertrophic toenails, 1 through 5 bilateral and clearing of subungual debris. No ulceration, no infection noted. ROV for arthritis evaluation. Return Visit-Office Procedure: Patient instructed to return to the office for a follow up visit 3 months for continued evaluation and treatment.    Gardiner Barefoot DPM

## 2019-07-23 ENCOUNTER — Ambulatory Visit
Admission: RE | Admit: 2019-07-23 | Discharge: 2019-07-23 | Disposition: A | Discharge status: Home / Self Care | Payer: Medicare Other | Source: Ambulatory Visit | Attending: Primary Care | Admitting: Primary Care

## 2019-07-23 DIAGNOSIS — E2839 Other primary ovarian failure: Secondary | ICD-10-CM | POA: Insufficient documentation

## 2019-07-24 ENCOUNTER — Other Ambulatory Visit: Payer: Self-pay | Admitting: Primary Care

## 2019-07-24 DIAGNOSIS — E119 Type 2 diabetes mellitus without complications: Secondary | ICD-10-CM

## 2019-07-24 DIAGNOSIS — I10 Essential (primary) hypertension: Secondary | ICD-10-CM

## 2019-07-25 ENCOUNTER — Encounter: Payer: Self-pay | Admitting: *Deleted

## 2019-07-27 ENCOUNTER — Encounter: Payer: Self-pay | Admitting: *Deleted

## 2019-07-31 ENCOUNTER — Other Ambulatory Visit: Payer: Self-pay | Admitting: Primary Care

## 2019-08-01 LAB — HM DIABETES EYE EXAM

## 2019-08-02 ENCOUNTER — Encounter: Payer: Self-pay | Admitting: Primary Care

## 2019-08-05 ENCOUNTER — Other Ambulatory Visit: Payer: Self-pay | Admitting: Primary Care

## 2019-08-05 DIAGNOSIS — I10 Essential (primary) hypertension: Secondary | ICD-10-CM

## 2019-08-13 IMAGING — DX DG LUMBAR SPINE COMPLETE 4+V
5 series · 5 of 5 positions shown · non-contrast
Comparison: Lumbar spine radiographs July 22, 2017 and chest CT
which included L[DATE]

CLINICAL DATA: L1 compression fracture.

EXAM:
LUMBAR SPINE - COMPLETE 4+ VIEW

[l-spine ap]
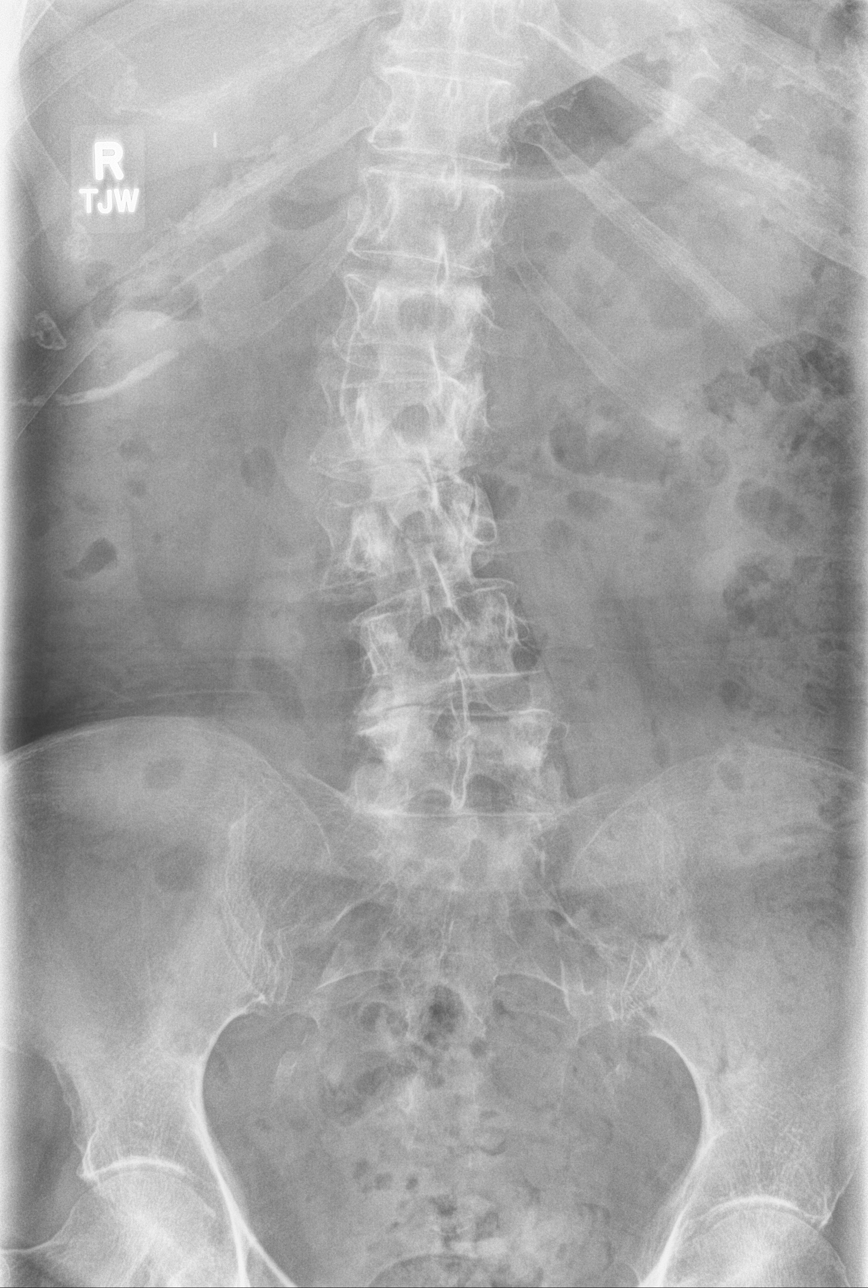

[l-spine obl (1 of 2)]
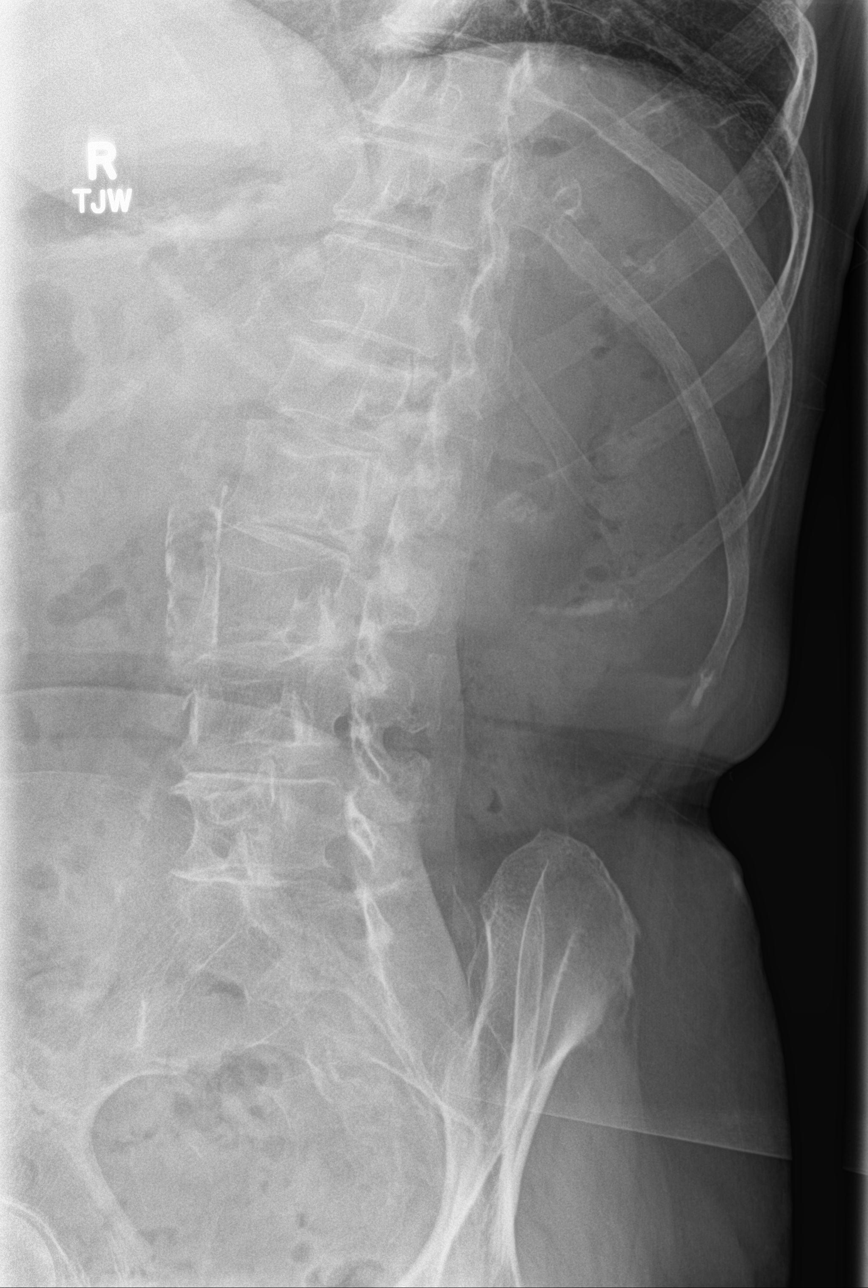

[l-spine obl (2 of 2)]
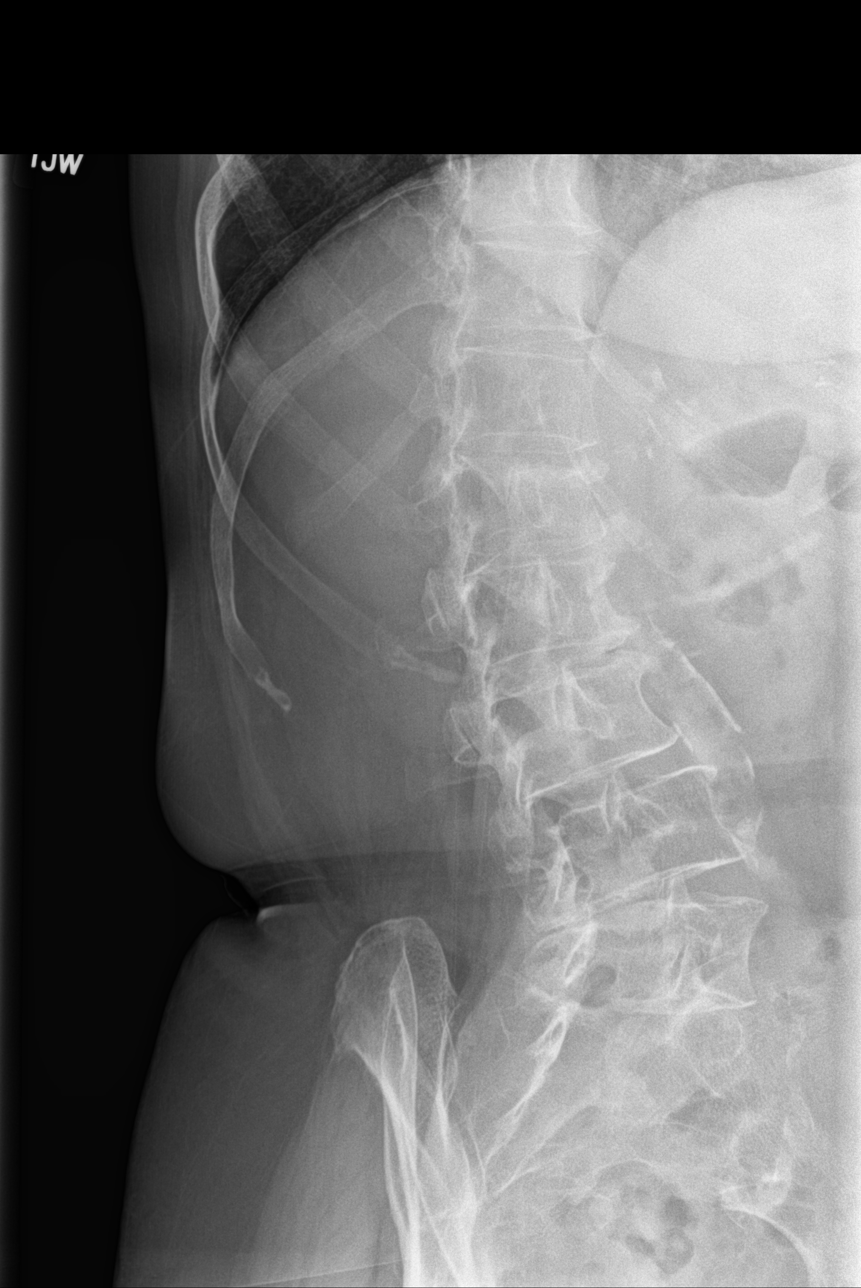

[l-spine lat]
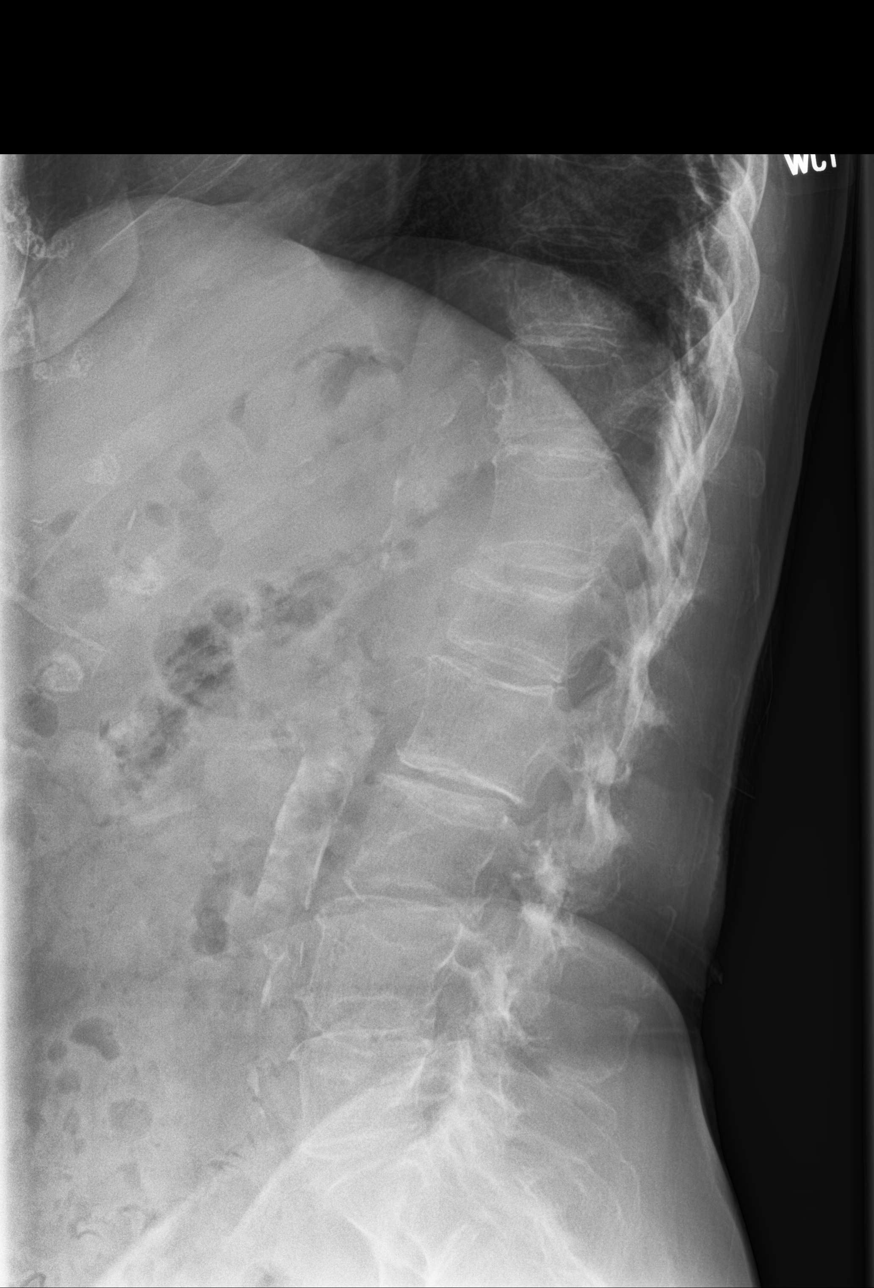

[l-spine l5/s1]
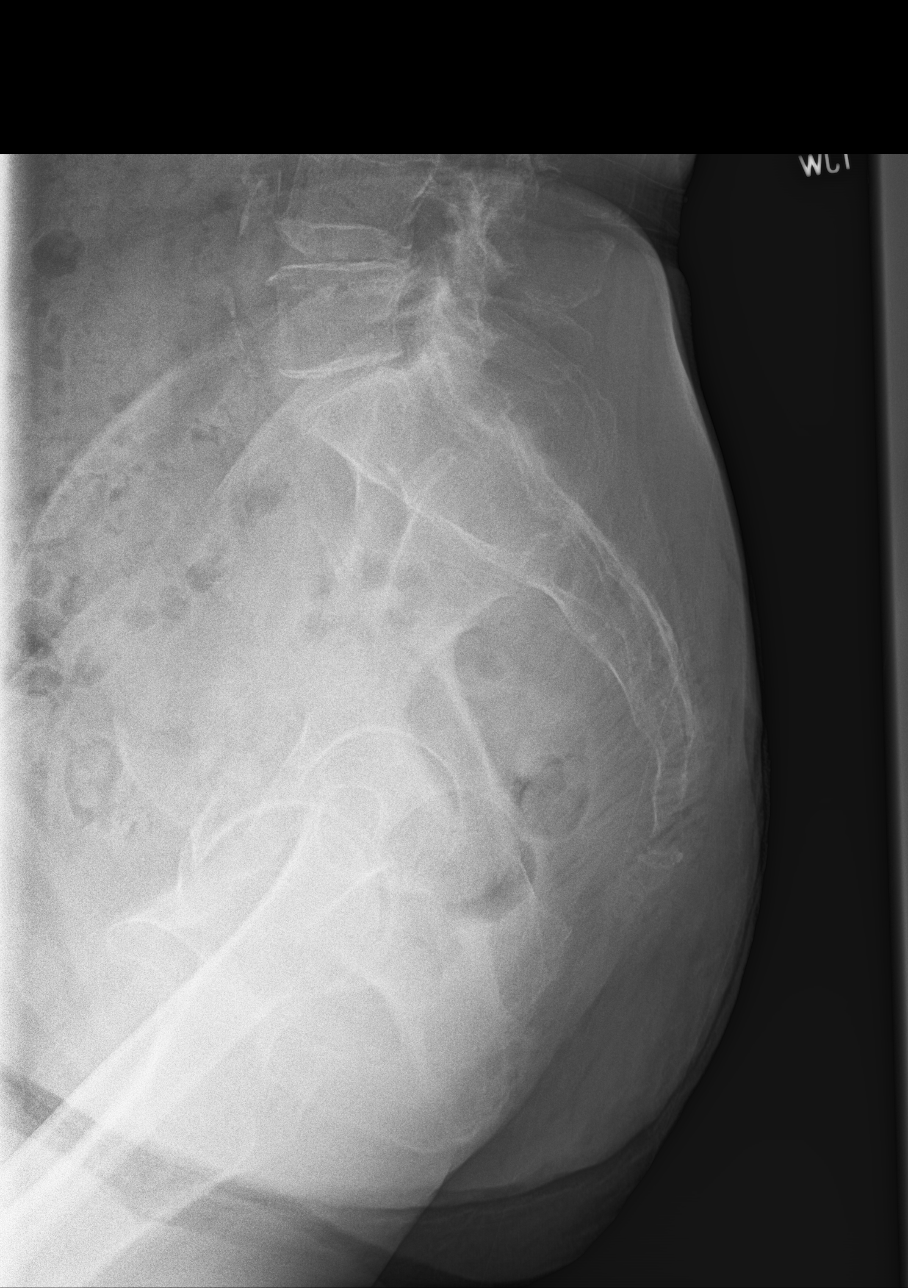

[5 of 5 positions shown; findings below may reference images not displayed]

FINDINGS: Frontal, lateral, spot lumbosacral lateral, and bilateral oblique
views were obtained. There are 5 non-rib-bearing lumbar type
vertebral bodies. There is mid lumbar dextroscoliosis with rotatory
component. There is anterior wedging of the L1 vertebral body which
has not progressed since recent radiographic examination. This
fracture was not appreciable on prior CT from 6917. No new fracture
is evident. No spondylolisthesis. There is moderate disc space
narrowing at L2-3, L3-4, L4-5, L5-S1. There is facet osteoarthritic
change at L3-4, L4-5, and L5-S1 bilaterally. There is aortic
atherosclerosis peer
IMPRESSION: The fracture at L1 has not progressed since 10 days prior
examination. This fracture is age uncertain but was not present on a
prior CT from July 2015. No new fracture. No spondylolisthesis.
There is stable scoliosis with multilevel arthropathy. There is
aortic atherosclerosis.

Aortic Atherosclerosis ([id number].A).

## 2019-09-01 ENCOUNTER — Other Ambulatory Visit: Payer: Self-pay | Admitting: Primary Care

## 2019-09-01 DIAGNOSIS — I10 Essential (primary) hypertension: Secondary | ICD-10-CM

## 2019-09-04 ENCOUNTER — Other Ambulatory Visit: Payer: Self-pay | Admitting: Primary Care

## 2019-09-04 DIAGNOSIS — E119 Type 2 diabetes mellitus without complications: Secondary | ICD-10-CM

## 2019-09-19 ENCOUNTER — Other Ambulatory Visit: Payer: Self-pay | Admitting: Primary Care

## 2019-09-19 DIAGNOSIS — I10 Essential (primary) hypertension: Secondary | ICD-10-CM

## 2019-09-21 ENCOUNTER — Other Ambulatory Visit: Payer: Self-pay | Admitting: Primary Care

## 2019-09-21 DIAGNOSIS — M810 Age-related osteoporosis without current pathological fracture: Secondary | ICD-10-CM

## 2019-09-21 NOTE — Telephone Encounter (Signed)
Last prescribed on 10/04/2018 . Last appointment on 06/11/2019. Next future appointment on 10/02/2019

## 2019-09-21 NOTE — Telephone Encounter (Signed)
Refill sent to pharmacy.   

## 2019-09-26 DIAGNOSIS — H16302 Unspecified interstitial keratitis, left eye: Secondary | ICD-10-CM | POA: Diagnosis not present

## 2019-09-26 LAB — HM DIABETES EYE EXAM

## 2019-10-02 ENCOUNTER — Encounter: Payer: Self-pay | Admitting: Primary Care

## 2019-10-02 ENCOUNTER — Other Ambulatory Visit: Payer: Self-pay

## 2019-10-02 ENCOUNTER — Ambulatory Visit (INDEPENDENT_AMBULATORY_CARE_PROVIDER_SITE_OTHER): Payer: Medicare Other | Admitting: Primary Care

## 2019-10-02 VITALS — BP 140/80 | HR 78 | Temp 96.3°F | Ht 65.0 in | Wt 132.5 lb

## 2019-10-02 DIAGNOSIS — E119 Type 2 diabetes mellitus without complications: Secondary | ICD-10-CM

## 2019-10-02 DIAGNOSIS — N289 Disorder of kidney and ureter, unspecified: Secondary | ICD-10-CM

## 2019-10-02 DIAGNOSIS — I10 Essential (primary) hypertension: Secondary | ICD-10-CM | POA: Diagnosis not present

## 2019-10-02 LAB — BASIC METABOLIC PANEL
BUN: 13 mg/dL (ref 6–23)
CO2: 28 mEq/L (ref 19–32)
Calcium: 9.2 mg/dL (ref 8.4–10.5)
Chloride: 103 mEq/L (ref 96–112)
Creatinine, Ser: 0.94 mg/dL (ref 0.40–1.20)
GFR: 57.06 mL/min — ABNORMAL LOW (ref >60.00)
Glucose, Bld: 260 mg/dL — ABNORMAL HIGH (ref 70–99)
Potassium: 4.4 mEq/L (ref 3.5–5.1)
Sodium: 136 mEq/L (ref 135–145)

## 2019-10-02 NOTE — Assessment & Plan Note (Signed)
Compliant to current regimen. A1C pending.  Foot and eye exam UTD. Pneumonia vaccination UTD. Declines statin therapy. Managed on ARB.

## 2019-10-02 NOTE — Progress Notes (Signed)
Subjective:    Patient ID: Morgan Bowen, female    DOB: 02-16-1936, 84 y.o.   MRN: [id number]  HPI  This visit occurred during the SARS-CoV-2 public health emergency.  Safety protocols were in place, including screening questions prior to the visit, additional usage of staff PPE, and extensive cleaning of exam room while observing appropriate contact time as indicated for disinfecting solutions.   Morgan Bowen is a 84 year old female with a history of hypertension, type 2 diabetes, osteoporosis, anxiety and depression, decreased renal function who presents today for follow up of diabetes.  Current medications include: Glimepiride 3 mg, Metformin 500 mg BID,   She is checking her blood glucose on occasion.   Last A1C: 7.9 in November 2020 Last Eye Exam: Completed in December 2020 Last Foot Exam: UTD Pneumonia Vaccination: Completed in 2019 ACE/ARB: Irbesartan Statin: None. LDL of 119. Declines statin therapy.  BP Readings from Last 3 Encounters:  10/02/19 140/80  06/11/19 (!) 148/80  06/02/19 124/60      Review of Systems  Respiratory: Negative for shortness of breath.   Cardiovascular: Negative for chest pain.  Neurological: Negative for dizziness and numbness.       Past Medical History:  Diagnosis Date  . Acute cystitis 05/23/2018  . Anxiety   . Arthritis   . Cellulitis 12/13/2018  . Chickenpox   . Diabetes mellitus without complication ([place])   . GERD (gastroesophageal reflux disease)   . Hypertension   . Osteoporosis   . Type 2 diabetes mellitus ([place])   . Urinary tract infection      Social History   Socioeconomic History  . Marital status: Widowed    Spouse name: Not on file  . Number of children: Not on file  . Years of education: Not on file  . Highest education level: Not on file  Occupational History  . Not on file  Tobacco Use  . Smoking status: Never Smoker  . Smokeless tobacco: Never Used  Substance and Sexual Activity  . Alcohol use: No  .  Drug use: No  . Sexual activity: Not Currently  Other Topics Concern  . Not on file  Social History Narrative   ** Merged History Encounter **       Widowed. Moved from [place]. 1 son, 3 grandchildren. Retired. Once worked as a Agricultural engineer.    Social Determinants of Health   Financial Resource Strain: Low Risk   . Difficulty of Paying Living Expenses: Not hard at all  Food Insecurity: No Food Insecurity  . Worried About Charity fundraiser in the Last Year: Never true  . Ran Out of Food in the Last Year: Never true  Transportation Needs: No Transportation Needs  . Lack of Transportation (Medical): No  . Lack of Transportation (Non-Medical): No  Physical Activity: Sufficiently Active  . Days of Exercise per Week: 7 days  . Minutes of Exercise per Session: 30 min  Stress: No Stress Concern Present  . Feeling of Stress : Not at all  Social Connections:   . Frequency of Communication with Friends and Family: Not on file  . Frequency of Social Gatherings with Friends and Family: Not on file  . Attends Religious Services: Not on file  . Active Member of Clubs or Organizations: Not on file  . Attends Archivist Meetings: Not on file  . Marital Status: Not on file  Intimate Partner Violence: Not At Risk  . Fear of Current or Ex-Partner:  No  . Emotionally Abused: No  . Physically Abused: No  . Sexually Abused: No    Past Surgical History:  Procedure Laterality Date  . ABDOMINAL HYSTERECTOMY  1984  . ABDOMINAL HYSTERECTOMY    . APPENDECTOMY  1958  . CHOLECYSTECTOMY    . GALLBLADDER SURGERY    . MANDIBLE FRACTURE SURGERY  07/13/2016  . TONSILLECTOMY  1958  . TONSILLECTOMY    . WRIST FRACTURE SURGERY Right    2000    Family History  Problem Relation Age of Onset  . Hypertension Mother   . Stroke Mother   . Stroke Father   . Hypertension Father   . Hypertension Sister   . Kidney disease Sister   . Breast cancer Neg Hx     Allergies  Allergen  Reactions  . Sulfamethoxazole-Trimethoprim   . Penicillins Rash    Current Outpatient Medications on File Prior to Visit  Medication Sig Dispense Refill  . amLODipine (NORVASC) 10 MG tablet Take 1 tablet (10 mg total) by mouth daily. For blood pressure. 90 tablet 3  . cloNIDine (CATAPRES) 0.1 MG tablet TAKE 1 TABLET BY MOUTH ONCE DAILY AS NEEDED FOR SYSTOLIC BLOOD PRESSURE ABOVE 170 30 tablet 1  . diclofenac sodium (VOLTAREN) 1 % GEL Apply 2 g topically 4 (four) times daily.     Marland Kitchen glimepiride (AMARYL) 2 MG tablet Take 1.5 tablets (3 mg total) by mouth daily with breakfast. 135 tablet 3  . glucose blood (ONE TOUCH ULTRA TEST) test strip USE AS DIRECTED TEST 3 TIMES DAILY E11.9 100 each 2  . irbesartan (AVAPRO) 300 MG tablet TAKE 1 TABLET BY MOUTH EVERY DAY 90 tablet 1  . metFORMIN (GLUCOPHAGE) 500 MG tablet TAKE 1 TABLET BY MOUTH TWICE DAILY WITH FOOD FOR DIABETES. 180 tablet 1  . metoprolol succinate (TOPROL-XL) 25 MG 24 hr tablet TAKE 1 TABLET BY MOUTH EVERY DAY FOR BLOOD PRESSURE 90 tablet 1  . sertraline (ZOLOFT) 25 MG tablet TAKE 1 TABLET (25 MG TOTAL) BY MOUTH DAILY. FOR ANXIETY. 90 tablet 1  . UNABLE TO FIND USE AS DIRECTED 3 TIMES A DAY    . Vitamin D, Ergocalciferol, (DRISDOL) 1.25 MG (50000 UNIT) CAPS capsule TAKE 1 CAPSULE BY MOUTH ONCE A WEEK 12 capsule 1   No current facility-administered medications on file prior to visit.    BP 140/80   Pulse 78   Temp (!) 96.3 F (35.7 C) (Temporal)   Ht 5\' 5"  (1.651 m)   Wt 132 lb 8 oz (60.1 kg)   SpO2 98%   BMI 22.05 kg/m    Objective:   Physical Exam  Constitutional: She appears well-nourished.  Cardiovascular: Normal rate and regular rhythm.  Respiratory: Effort normal and breath sounds normal.  Musculoskeletal:     Cervical back: Neck supple.  Skin: Skin is warm and dry.  Psychiatric: She has a normal mood and affect.           Assessment & Plan:

## 2019-10-02 NOTE — Patient Instructions (Signed)
Stop by the lab prior to leaving today. I will notify you of your results once received.   Please schedule a physical with me for 6 months. You may also schedule a lab only appointment 3-4 days prior. We will discuss your lab results in detail during your physical.  It was a pleasure to see you today!

## 2019-10-02 NOTE — Assessment & Plan Note (Signed)
Stable in the office today, continue current regimen. 

## 2019-10-04 LAB — HEMOGLOBIN A1C: Hgb A1c MFr Bld: 8.6 % — ABNORMAL HIGH (ref 4.6–6.5)

## 2019-10-15 ENCOUNTER — Ambulatory Visit (INDEPENDENT_AMBULATORY_CARE_PROVIDER_SITE_OTHER): Payer: Medicare Other | Admitting: Podiatry

## 2019-10-15 ENCOUNTER — Other Ambulatory Visit: Payer: Self-pay

## 2019-10-15 ENCOUNTER — Encounter: Payer: Self-pay | Admitting: Podiatry

## 2019-10-15 DIAGNOSIS — M79674 Pain in right toe(s): Secondary | ICD-10-CM | POA: Diagnosis not present

## 2019-10-15 DIAGNOSIS — B351 Tinea unguium: Secondary | ICD-10-CM

## 2019-10-15 DIAGNOSIS — E119 Type 2 diabetes mellitus without complications: Secondary | ICD-10-CM

## 2019-10-15 DIAGNOSIS — M79675 Pain in left toe(s): Secondary | ICD-10-CM | POA: Diagnosis not present

## 2019-10-15 NOTE — Progress Notes (Addendum)
Complaint:  Visit Type: This patient presents to the office for at risk foot care patient requires this care by professional since  this patient will be at risk  due to having diabetes.  Patient is unable to cut her own toenails since she cannot reach down.  She presents for at risk foot care today.  Podiatric Exam: Vascular: dorsalis pedis and posterior tibial pulses are palpable bilateral. Capillary return is immediate. Temperature gradient is WNL. Skin turgor WNL  Sensorium: Normal Semmes Weinstein monofilament test. Normal tactile sensation bilaterally. Nail Exam: Pt has thick disfigured discolored nails with subungual debris noted bilateral entire nail hallux.  Nails are thick disfigured and discolored hallux toenails. Ulcer Exam: There is no evidence of ulcer or pre-ulcerative changes or infection. Orthopedic Exam: Muscle tone and strength are WNL. No limitations in general ROM. No crepitus or effusions noted. Foot type and digits show no abnormalities. Bony prominences are unremarkable. Skin: No Porokeratosis. No infection or ulcers  Diagnosis:  Onychomycosis, , Pain in right toe, pain in left toes  Midfoot arthritis.  Treatment & Plan Procedures and Treatment: Consent by patient was obtained for treatment procedures.   Debridement of mycotic and hypertrophic toenails, 1 through 5 bilateral and clearing of subungual debris. No ulceration, no infection noted.  Told the patient to return for periodic foot evaluation to reduce potential  Aa risk. complications. Return Visit-Office Procedure: Patient instructed to return to the office for a follow up visit 3 months for continued evaluation and treatment.    Gardiner Barefoot DPM

## 2019-11-20 ENCOUNTER — Telehealth: Payer: Self-pay

## 2019-11-20 NOTE — Telephone Encounter (Signed)
[facility] Night - Client Nonclinical Telephone Record AccessNurse Client [facility] Night - Client Client Site [place] Physician [name] - NP Contact Type Call Who Is Calling Patient / Member / Family / Caregiver Caller Name [place] Phone Number [phone] Patient Name Morgan Bowen Patient DOB Jan 19, 1936 Call Type Message Only Information Provided Reason for Call Request to Schedule Office Appointment Initial Comment Caller states that she has a rash that is spreading and she would like to be seen. Additional Comment Provided office hours. Caller declined triage. Disp. Time Disposition Final User 11/20/2019 7:50:05 AM General Information Provided Yes Arty Baumgartner Call Closed By: Arty Baumgartner Transaction Date/Time: 11/20/2019 7:47:55 AM (ET)

## 2019-11-20 NOTE — Telephone Encounter (Signed)
Called to schedule patient. Lvm asking her to call office.

## 2019-11-21 ENCOUNTER — Encounter: Payer: Self-pay | Admitting: Family Medicine

## 2019-11-21 ENCOUNTER — Other Ambulatory Visit: Payer: Self-pay

## 2019-11-21 ENCOUNTER — Ambulatory Visit (INDEPENDENT_AMBULATORY_CARE_PROVIDER_SITE_OTHER): Payer: Medicare Other | Admitting: Family Medicine

## 2019-11-21 VITALS — BP 154/72 | HR 78 | Temp 97.5°F | Ht 65.0 in | Wt 130.0 lb

## 2019-11-21 DIAGNOSIS — L309 Dermatitis, unspecified: Secondary | ICD-10-CM

## 2019-11-21 MED ORDER — TRIAMCINOLONE ACETONIDE 0.1 % EX CREA: 1.0000 "application " | TOPICAL_CREAM | Freq: Two times a day (BID) | CUTANEOUS | 0 refills | Status: DC

## 2019-11-21 NOTE — Patient Instructions (Signed)
Use the topical steroid cream on the rash Use twice a day for 2-3 weeks If worsening - return  Or if not improved within 3 weeks return to the office Avoid the dog while treating and one week finishing

## 2019-11-21 NOTE — Progress Notes (Signed)
   Subjective:     Morgan Bowen is a 84 y.o. female presenting for Rash (upper right leg. since 11/19/19. Itching present.)     HPI  #Rash - upper right leg - present x 2 days - tried a salve - used BFI powder - antiseptic powder which she used yesterday which relieved the itching - itchy and spreading - no hx of prior rash - no pain - no change - contact: no soap changes, no outside, did eat shrimp Saturday, took a b-complex vitamin, new contact with dog who is sitting on lap  Review of Systems   Social History   Tobacco Use  Smoking Status Never Smoker  Smokeless Tobacco Never Used        Objective:    BP Readings from Last 3 Encounters:  11/21/19 (!) 154/72  10/02/19 140/80  06/11/19 (!) 148/80   Wt Readings from Last 3 Encounters:  11/21/19 130 lb (59 kg)  10/02/19 132 lb 8 oz (60.1 kg)  06/11/19 130 lb 4 oz (59.1 kg)    BP (!) 154/72   Pulse 78   Temp (!) 97.5 F (36.4 C)   Ht 5\' 5"  (1.651 m)   Wt 130 lb (59 kg)   SpO2 98%   BMI 21.63 kg/m    Physical Exam Constitutional:      General: She is not in acute distress.    Appearance: She is well-developed. She is not diaphoretic.  HENT:     Right Ear: External ear normal.     Left Ear: External ear normal.  Eyes:     Conjunctiva/sclera: Conjunctivae normal.  Cardiovascular:     Rate and Rhythm: Normal rate.  Pulmonary:     Effort: Pulmonary effort is normal.  Musculoskeletal:     Cervical back: Neck supple.  Skin:    General: Skin is warm and dry.     Capillary Refill: Capillary refill takes less than 2 seconds.     Comments: Right upper thigh with patchy faintly erythematous papular rash with scales. Also with some chronic skin changes per patient - hypopigmented lesions  Neurological:     Mental Status: She is alert. Mental status is at baseline.  Psychiatric:        Mood and Affect: Mood normal.        Behavior: Behavior normal.           Assessment & Plan:   Problem List  Items Addressed This Visit    None    Visit Diagnoses    Dermatitis    -  Primary   Relevant Medications   triamcinolone cream (KENALOG) 0.1 %     Given scale discussed likely dermatitis and would improve with steroid.  Advised avoiding new dog until improved and slow re-introduction to see if this was the cause.   Return if symptoms worsen or fail to improve.  Lesleigh Noe, MD

## 2019-12-13 ENCOUNTER — Telehealth: Payer: Self-pay | Admitting: Primary Care

## 2019-12-13 NOTE — Progress Notes (Signed)
  Chronic Care Management   Note  12/13/2019 Name: ANAIZA AZBILL MRN: [id number] DOB: 1936-04-11  NAYLIN PERAZZO is a 84 y.o. year old female who is a primary care patient of [name], NP. I reached out to [name] by phone today in response to a referral sent by Ms. Biana A Floyd's PCP, [name], NP.   Ms. Masso was given information about Chronic Care Management services today including:  1. CCM service includes personalized support from designated clinical staff supervised by her physician, including individualized plan of care and coordination with other care providers 2. 24/7 contact phone numbers for assistance for urgent and routine care needs. 3. Service will only be billed when office clinical staff spend 20 minutes or more in a month to coordinate care. 4. Only one practitioner may furnish and bill the service in a calendar month. 5. The patient may stop CCM services at any time (effective at the end of the month) by phone call to the office staff.   Patient agreed to services and verbal consent obtained.    This note is not being shared with the patient for the following reason: To respect privacy (The patient or proxy has requested that the information not be shared).  Follow up plan:   Raynicia Dukes [employer] Scheduler

## 2020-01-15 ENCOUNTER — Other Ambulatory Visit: Payer: Self-pay | Admitting: Primary Care

## 2020-01-15 DIAGNOSIS — I10 Essential (primary) hypertension: Secondary | ICD-10-CM

## 2020-01-16 ENCOUNTER — Other Ambulatory Visit: Payer: Self-pay | Admitting: Primary Care

## 2020-01-16 DIAGNOSIS — E119 Type 2 diabetes mellitus without complications: Secondary | ICD-10-CM

## 2020-01-17 ENCOUNTER — Ambulatory Visit (INDEPENDENT_AMBULATORY_CARE_PROVIDER_SITE_OTHER): Payer: Medicare Other | Admitting: Podiatry

## 2020-01-17 ENCOUNTER — Encounter: Payer: Self-pay | Admitting: Podiatry

## 2020-01-17 ENCOUNTER — Other Ambulatory Visit: Payer: Self-pay

## 2020-01-17 DIAGNOSIS — E119 Type 2 diabetes mellitus without complications: Secondary | ICD-10-CM | POA: Diagnosis not present

## 2020-01-17 DIAGNOSIS — M79674 Pain in right toe(s): Secondary | ICD-10-CM | POA: Diagnosis not present

## 2020-01-17 DIAGNOSIS — B351 Tinea unguium: Secondary | ICD-10-CM

## 2020-01-17 DIAGNOSIS — M79675 Pain in left toe(s): Secondary | ICD-10-CM

## 2020-01-17 DIAGNOSIS — M129 Arthropathy, unspecified: Secondary | ICD-10-CM

## 2020-01-17 NOTE — Progress Notes (Addendum)
Complaint:  Visit Type: This patient presents to the office for at risk foot care patient requires this care by professional since  this patient will be at risk  due to having diabetes.  Patient is unable to cut her own toenails since she cannot reach down.  She presents for at risk foot care today.  Patient says she has injured her fifth toe right foot which is now discolored.  Podiatric Exam: Vascular: dorsalis pedis and posterior tibial pulses are palpable bilateral. Capillary return is immediate. Temperature gradient is WNL. Skin turgor WNL  Sensorium: Normal Semmes Weinstein monofilament test. Normal tactile sensation bilaterally. Nail Exam: Pt has thick disfigured discolored nails with subungual debris noted bilateral entire nail hallux.  Nails are thick disfigured and discolored hallux toenails. Ulcer Exam: There is no evidence of ulcer or pre-ulcerative changes or infection. Orthopedic Exam: Muscle tone and strength are WNL. No limitations in general ROM. No crepitus or effusions noted. Foot type and digits show no abnormalities. Bony prominences are unremarkable. Skin: No Porokeratosis. No infection or ulcers  Diagnosis:  Onychomycosis, , Pain in right toe, pain in left toes  Midfoot arthritis.  Treatment & Plan Procedures and Treatment: Consent by patient was obtained for treatment procedures.   Debridement of mycotic and hypertrophic toenails, 1 through 5 bilateral and clearing of subungual debris. No ulceration, no infection noted.  Told the patient to return for periodic foot evaluation to reduce potential  at risk. Complications.  Patient is not interested in evaluation of her fifth toe today. Return Visit-Office Procedure: Patient instructed to return to the office for a follow up visit 3 months for continued evaluation and treatment.    Gardiner Barefoot DPM

## 2020-01-25 ENCOUNTER — Telehealth: Payer: Self-pay

## 2020-01-25 NOTE — Telephone Encounter (Signed)
Per written referral from PCP, requesting referral in Epic for Morgan Bowen to chronic care management pharmacy services for the following conditions:   Essential hypertension, benign  [I10]  Type 2 diabetes mellitus [E11.9]  [name], PharmD Clinical Pharmacist [facility] at [facility] [phone]

## 2020-01-29 NOTE — Chronic Care Management (AMB) (Deleted)
Chronic Care Management Pharmacy  Name: Morgan Bowen  MRN: [id number] DOB: 03/26/1936  Chief Complaint/ HPI  Morgan Bowen presents for their {Initial/Follow-up:3041532} CCM visit with the clinical pharmacist {CHL HP Upstream Pharm visit YQMV:[id number]}.  PCP : [name], NP  Their chronic conditions include: HTN, type 2 diabetes, vitamin D deficiency  *** 5/6 consent  Office Visits: 11/21/19 OV Einar Pheasant, Jessica) - dermatitis. Advised to avoid new dog until symptoms improved. Started on triamcinolone 0.1% cream twice daily  10/02/19 OV - routine follow-up. A1c 8.6% from 7.9% (07/02/19). Patient denies statin therapy. No changes with other meds. F/u visit every 3 months.  Consult Visit: 01/16/17 Ok Edwards Prudence Davidson, Podiatry) - Onychomycosis. Pain in the right and left toes.    Medications: Outpatient Encounter Medications as of 01/30/2020  Medication Sig  . amLODipine (NORVASC) 10 MG tablet Take 1 tablet (10 mg total) by mouth daily. For blood pressure.  . cloNIDine (CATAPRES) 0.1 MG tablet TAKE 1 TABLET BY MOUTH ONCE DAILY AS NEEDED FOR SYSTOLIC BLOOD PRESSURE ABOVE 170  . glimepiride (AMARYL) 2 MG tablet Take 1.5 tablets (3 mg total) by mouth daily with breakfast.  . glucose blood (ONE TOUCH ULTRA TEST) test strip USE AS DIRECTED TEST 3 TIMES DAILY E11.9  . irbesartan (AVAPRO) 300 MG tablet TAKE 1 TABLET BY MOUTH EVERY DAY  . metFORMIN (GLUCOPHAGE) 500 MG tablet TAKE 1 TABLET BY MOUTH TWICE DAILY WITH FOOD FOR DIABETES.  . metoprolol succinate (TOPROL-XL) 25 MG 24 hr tablet TAKE 1 TABLET BY MOUTH EVERY DAY FOR BLOOD PRESSURE  . mupirocin ointment (BACTROBAN) 2 % mupirocin 2 % topical ointment  . triamcinolone cream (KENALOG) 0.1 % Apply 1 application topically 2 (two) times daily.  Marland Kitchen UNABLE TO FIND USE AS DIRECTED 3 TIMES A DAY  . Vitamin D, Ergocalciferol, (DRISDOL) 1.25 MG (50000 UNIT) CAPS capsule TAKE 1 CAPSULE BY MOUTH ONCE A WEEK   No facility-administered  encounter medications on file as of 01/30/2020.     Current Diagnosis/Assessment:  Goals Addressed   None     Hypertension   BP today is:  {CHL HP UPSTREAM Pharmacist BP ranges:[phone]}  Office blood pressures are  BP Readings from Last 3 Encounters:  11/21/19 (!) 154/72  10/02/19 140/80  06/11/19 (!) 148/80    Patient has failed these meds in the past: diltiazem, losartan, valsartan Patient is currently {CHL Controlled/Uncontrolled:[phone]} on the following medications:  . Amlodipine 10 mg 1 tablet once daily . Clonidine 0.1 mg 1 tablet once daily as needed for systolic blood pressure above 170 . Irbesartan 300 mg 1 tablet once daily . Metoprolol succinate 25 mg 1 tablet once daily  Patient checks BP at home {CHL HP BP Monitoring [phone]}  Patient home BP readings are ranging: ***  We discussed {CHL HP Upstream Pharmacy [phone]}  Plan  Continue {CHL HP Upstream Pharmacy [phone]}    Diabetes   Recent Relevant Labs: Lab Results  Component Value Date/Time   HGBA1C 8.6 (H) 10/02/2019 08:34 AM   HGBA1C 7.9 (A) 07/02/2019 08:52 AM   HGBA1C 8.1 (H) 03/27/2019 08:17 AM   GFR 57.06 (L) 10/02/2019 08:34 AM   GFR 56.44 (L) 03/27/2019 08:17 AM     Checking BG: {CHL HP Blood Glucose Monitoring Frequency:[id number]}  Recent FBG Readings: *** Recent pre-meal BG readings: *** Recent 2hr PP BG readings:  *** Recent HS BG readings: ***  Patient has failed these meds in past: None Patient is currently {CHL  Controlled/[phone]} on the following medications:  . Glimeperide 2 mg 1.5 tablet once daily with breakfast . Metformin 500 mg 1 tablet twice daily with food   Last diabetic Eye exam:  Lab Results  Component Value Date/Time   HMDIABEYEEXA No Retinopathy 08/01/2019 12:00 AM    Last diabetic Foot exam: No results found for: HMDIABFOOTEX   We discussed: {CHL HP Upstream Pharmacy  discussion:[phone]}  Plan  Continue {CHL HP Upstream Pharmacy Plans:[phone]}    Vitamin D Deficiency   Lab Results  Component Value Date   VD25OH 61.67 03/27/2019   Patient has failed these meds in past: *** Patient is currently {CHL Controlled/[phone]} on the following medications:   Vitamin D 50000 units 1 capsule once weekly  We discussed:  ***  Plan  Continue {CHL HP Upstream Pharmacy Plans:[phone]}   Dermatitis   Patient has failed these meds in past: *** Patient is currently {CHL Controlled/[phone]} on the following medications:  . Triamcinolone 0.1% cream twice daily  We discussed:  ***  Plan  Continue {CHL HP Upstream Pharmacy NLZJQ:[id number]}   OTCs/Health Maintenance   Patient is currently {CHL Controlled/[phone]} on the following medications: . OneTouch Ultra test strips use 3 times daily . Mupirocin 2% ointment  ***  We discussed:  ***  Plan  Continue {CHL HP Upstream Pharmacy IOXBD:[id number]}  Vaccines   Reviewed and discussed patient's vaccination history.    Immunization History  Administered Date(s) Administered  . Influenza, High Dose Seasonal PF 05/09/2012  . Influenza,inj,Quad PF,6+ Mos 03/30/2019  . Pneumococcal Conjugate-13 10/24/2017  . Pneumococcal Polysaccharide-23 08/23/2013  . Tdap 07/14/2015  . Zoster 08/06/2013, 12/08/2014    Plan  Recommended patient receive *** vaccine in *** office/pharmacy.    Medication Management   Pharmacy/Benefits: [facility] Adherence:  Pt endorses ***% compliance  We discussed: ***  Plan  {US Pharmacy TMHD:62229}   Follow up: *** month phone visit  Timer :  [id number] Maddock. Valladares 01/00/1900 [name] AARP 0 Yes CPP Chart prep 17:38:18 17:58:32 0:20:14

## 2020-01-30 ENCOUNTER — Other Ambulatory Visit: Payer: Self-pay

## 2020-01-30 ENCOUNTER — Ambulatory Visit: Payer: Medicare Other

## 2020-01-30 DIAGNOSIS — E119 Type 2 diabetes mellitus without complications: Secondary | ICD-10-CM

## 2020-01-30 DIAGNOSIS — I10 Essential (primary) hypertension: Secondary | ICD-10-CM

## 2020-01-30 NOTE — Chronic Care Management (AMB) (Signed)
Chronic Care Management Pharmacy  Name: Morgan Bowen  MRN: [id number] DOB: 22-May-1936  Chief Complaint/ HPI  Morgan Bowen,  84 y.o., female presents for their Initial CCM visit with the clinical pharmacist via telephone.  PCP : [name], NP  Their chronic conditions include: HTN, type 2 diabetes, vitamin D deficiency, osteoporosis, anxiety, depression  Patient concerns: denies medication or health concerns  Office Visits:  11/21/19 OV Einar Pheasant, Jessica) - dermatitis. Advised to avoid new dog until symptoms improved. Started on triamcinolone 0.1% cream twice daily  10/02/19 OV Carlis Abbott, [place]) routine follow-up. A1c 8.6% from 7.9% (07/02/19). Patient denies statin therapy. No changes with other meds. F/u visit every 3 months.  Consult Visit:  01/16/17 Ok Edwards Prudence Davidson, Podiatry) - Onychomycosis. Pain in the right and left toes.   Medications: Outpatient Encounter Medications as of 01/30/2020  Medication Sig  . amLODipine (NORVASC) 10 MG tablet Take 1 tablet (10 mg total) by mouth daily. For blood pressure.  Marland Kitchen glimepiride (AMARYL) 2 MG tablet Take 1.5 tablets (3 mg total) by mouth daily with breakfast.  . glucose blood (ONE TOUCH ULTRA TEST) test strip USE AS DIRECTED TEST 3 TIMES DAILY E11.9  . irbesartan (AVAPRO) 300 MG tablet TAKE 1 TABLET BY MOUTH EVERY DAY  . metFORMIN (GLUCOPHAGE) 500 MG tablet TAKE 1 TABLET BY MOUTH TWICE DAILY WITH FOOD FOR DIABETES.  . metoprolol succinate (TOPROL-XL) 25 MG 24 hr tablet TAKE 1 TABLET BY MOUTH EVERY DAY FOR BLOOD PRESSURE  . triamcinolone cream (KENALOG) 0.1 % Apply 1 application topically 2 (two) times daily.  . Vitamin D, Ergocalciferol, (DRISDOL) 1.25 MG (50000 UNIT) CAPS capsule TAKE 1 CAPSULE BY MOUTH ONCE A WEEK  . cloNIDine (CATAPRES) 0.1 MG tablet TAKE 1 TABLET BY MOUTH ONCE DAILY AS NEEDED FOR SYSTOLIC BLOOD PRESSURE ABOVE 170 (Patient not taking: Reported on 01/30/2020)  . mupirocin ointment (BACTROBAN) 2 % mupirocin 2 % topical ointment   . UNABLE TO FIND USE AS DIRECTED 3 TIMES A DAY   No facility-administered encounter medications on file as of 01/30/2020.   Current Diagnosis/Assessment:  SDOH Interventions     Most Recent Value  SDOH Interventions  Financial Strain Interventions Financial Counselor  [Referred to [facility]]     Goals Addressed            This Visit's Progress   . Chronic Care Management       CARE PLAN ENTRY  Current Barriers:  . Chronic Disease Management support, education, and care coordination needs related to Hypertension and Diabetes   Hypertension BP Readings from Last 3 Encounters:  11/21/19 (!) 154/72  10/02/19 140/80  06/11/19 (!) 148/80 .  Pharmacist Clinical Goal(s): o Over the next 12 weeks, patient will work with PharmD and providers to maintain BP goal <140/90 mmHg . Current regimen:  . Amlodipine 10 mg - 1 tablet once daily . Irbesartan 300 mg - 1 tablet once daily . Metoprolol succinate 25 mg - 1 tablet once daily . Clonidine 0.1 mg  - 1 tablet daily for systolic blood pressure above 170 . Interventions: o Reviewed home blood pressure readings . Patient self care activities - Over the next 12 weeks, patient will: o Continue to monitor blood pressure several days per week, document, and provide at future appointments o Ensure daily salt intake < 2300 mg/day  Diabetes Lab Results  Component Value Date/Time   HGBA1C 8.6 (H) 10/02/2019 08:34 AM   HGBA1C 7.9 (A) 07/02/2019 08:52 AM   HGBA1C 8.1 (  H) 03/27/2019 08:17 AM .  Pharmacist Clinical Goal(s): o Over the next 12 weeks, patient will work with PharmD and providers to achieve A1c goal <7% . Current regimen:  Marland Kitchen Glimepiride 2 mg - 1 and 1/2 tablet once daily with breakfast . Metformin 500 mg - 1 tablet twice daily with food  . Interventions: o Discuss dietary and exercise goals  . Patient self care activities - Over the next 12 weeks, patient will: o Check blood sugar once daily, document, and provide at future  appointment o Contact provider with any episodes of hypoglycemia o Slowly increase exercise with goal of 30 minutes, 5 days per week o Eat a healthy diet high in vegetables, fruits and whole grains with low-fat dairy products, chicken, fish, legumes, non-tropical vegetable oils and nuts. Limit intake of sweets, sugar-sweetened beverages and red meats.  Medication Cost: . [place] Part D plans prefer [facility] education careers [facility]. Prices may be lower at one of these preferred pharmacies. Call the number on the back of your insurance card to verify. You can also call [phone] to reach a local [place] counselor who will help you compare Medicare plans and answer any questions.  Initial goal documentation      Hypertension   CMP Latest Ref Rng & Units 10/02/2019 06/02/2019 03/27/2019  Glucose 70 - 99 mg/dL 260(H) 221(H) 163(H)  BUN 6 - 23 mg/dL 13 16 14   Creatinine 0.40 - 1.20 mg/dL 0.94 0.90 0.95  Sodium 135 - 145 mEq/L 136 139 135  Potassium 3.5 - 5.1 mEq/L 4.4 4.5 4.9  Chloride 96 - 112 mEq/L 103 102 102  CO2 19 - 32 mEq/L 28 23 27   Calcium 8.4 - 10.5 mg/dL 9.2 9.6 9.5  Total Protein 6.5 - 8.1 g/dL - 8.7(H) 7.7  Total Bilirubin 0.3 - 1.2 mg/dL - 0.5 0.6  Alkaline Phos 38 - 126 U/L - 81 66  AST 15 - 41 U/L - 22 17  ALT 0 - 44 U/L - 19 16   Office blood pressures are  BP Readings from Last 3 Encounters:  11/21/19 (!) 154/72  10/02/19 140/80  06/11/19 (!) 148/80   BP goal < 140/90 mmHg Patient has failed these meds in the past: diltiazem, losartan, valsartan Patient is currently controlled on the following medications:  . Amlodipine 10 mg - 1 tablet once daily . Irbesartan 300 mg - 1 tablet once daily . Metoprolol succinate 25 mg - 1 tablet once daily  PRN:  Clonidine 0.1 mg - 1 tablet once daily as needed for systolic blood pressure above 170  Patient checks BP at home: 3-5 days/week with [employer] arm monitor  Patient home BP readings are ranging: 130s/60s, occasionally SBP  150 but not often   We discussed: reports has not needed clonidine since starting amlodipine 05/2019  Plan: Continue current medications   Diabetes   Reports diagnosed in 1996; reports A1c was controlled until last 12 months. Used to go to health club every day. Since COVID has not been able to return.   Recent Relevant Labs: Lab Results  Component Value Date/Time   HGBA1C 8.6 (H) 10/02/2019 08:34 AM   HGBA1C 7.9 (A) 07/02/2019 08:52 AM   HGBA1C 8.1 (H) 03/27/2019 08:17 AM   GFR 57.06 (L) 10/02/2019 08:34 AM   GFR 56.44 (L) 03/27/2019 08:17 AM    Kidney Function Lab Results  Component Value Date/Time   CREATININE 0.94 10/02/2019 08:34 AM   CREATININE 0.90 06/02/2019 12:59 AM   GFR  57.06 (L) 10/02/2019 08:34 AM   GFRNONAA 60 (L) 06/02/2019 12:59 AM   GFRAA >60 06/02/2019 12:59 AM   K 4.4 10/02/2019 08:34 AM   K 4.5 06/02/2019 12:59 AM   Checking BG: every other day  Usually checks after breakfast and late afternoon and reports blood glucose is erratic  A1c goal < 7% Patient has failed these meds in past: None Patient is currently uncontrolled on the following medications:  . Glimepiride 2 mg - 1 and 1/2 tablet once daily with breakfast . Metformin 500 mg - 1 tablet twice daily with meals   Last diabetic eye exam:  Lab Results  Component Value Date/Time   HMDIABEYEEXA No Retinopathy 08/01/2019 12:00 AM    Last diabetic foot exam:  10/23/19  We discussed: Reports A1c was higher in February due to diet/eating out more with friends at living facility. Reports she is eating in now. She would like to adjust diet and exercise until August lab work, then reassess if medication changes are needed.  Plan: Continue current medications; Slowly increase exercise with goal of 30 minutes, 5 days per week. Eat a healthy diet high in vegetables, fruits and whole grains with low-fat dairy products, chicken, fish, legumes, non-tropical vegetable oils and nuts. Limit intake of sweets,  sugar-sweetened beverages and red meats.   Vitamin D Deficiency   Lab Results  Component Value Date   VD25OH 61.67 03/27/2019   Patient has failed these meds in past: none reported Patient is currently controlled on the following medications:   Vitamin D 50,000 units - 1 capsule once weekly  We discussed: confirmed adherence  Plan: Continue current medications   Dermatitis   Patient is currently controlled on the following medications:  . Triamcinolone 0.1% cream - apply to affected area twice daily  We discussed: takes PRN, works well   Plan: Continue current medications   Depression   Patient has failed these meds in past: none Patient is currently controlled on the following medications:   No pharmacotherapy  We discussed: Denies taking sertraline, reports she never started the medication. Denies interest in medications and denies anxiety or depressed symptoms.   Plan: Continue management without medication.  OTCs/Health Maintenance   Patient is currently on the following medications: . OneTouch Ultra test strips  . A-F Betafood - PRN indigestion . B-complex - 1 tablet every other day  . Vitamin C - 1 tablet daily . Vitamin E - 1 tablet daily  Plan: Continue current medications  Medication Management   [facility]efits[facility]  Cost: reports BP medication and test strips are expensive Adherence: < 5 day gap between refills  We discussed: Prices may be lower at your insurance preferred pharmacies. Call the number on the back of your insurance card to verify.   Follow up:  3 month phone visit   [name], PharmD Clinical Pharmacist [facility] at [facility] [phone]

## 2020-01-31 ENCOUNTER — Other Ambulatory Visit: Payer: Self-pay | Admitting: Primary Care

## 2020-02-13 NOTE — Patient Instructions (Addendum)
Dear Morgan Bowen,  It was a pleasure meeting you during our initial appointment on January 30, 2020. Below is a summary of the goals we discussed and components of chronic care management. Please contact me anytime with questions or concerns.   Visit Information  Goals Addressed            This Visit's Progress   . Chronic Care Management       CARE PLAN ENTRY  Current Barriers:  . Chronic Disease Management support, education, and care coordination needs related to Hypertension and Diabetes   Hypertension BP Readings from Last 3 Encounters:  11/21/19 (!) 154/72  10/02/19 140/80  06/11/19 (!) 148/80 .  Pharmacist Clinical Goal(s): o Over the next 12 weeks, patient will work with PharmD and providers to maintain BP goal <140/90 mmHg . Current regimen:  . Amlodipine 10 mg - 1 tablet once daily . Irbesartan 300 mg - 1 tablet once daily . Metoprolol succinate 25 mg - 1 tablet once daily . Clonidine 0.1 mg  - 1 tablet daily for systolic blood pressure above 170 . Interventions: o Reviewed home blood pressure readings . Patient self care activities - Over the next 12 weeks, patient will: o Continue to monitor blood pressure several days per week, document, and provide at future appointments o Ensure daily salt intake < 2300 mg/day  Diabetes Lab Results  Component Value Date/Time   HGBA1C 8.6 (H) 10/02/2019 08:34 AM   HGBA1C 7.9 (A) 07/02/2019 08:52 AM   HGBA1C 8.1 (H) 03/27/2019 08:17 AM .  Pharmacist Clinical Goal(s): o Over the next 12 weeks, patient will work with PharmD and providers to achieve A1c goal <7% . Current regimen:  Marland Kitchen Glimepiride 2 mg - 1 and 1/2 tablet once daily with breakfast . Metformin 500 mg - 1 tablet twice daily with food  . Interventions: o Discuss dietary and exercise goals  . Patient self care activities - Over the next 12 weeks, patient will: o Check blood sugar once daily, document, and provide at future appointment o Contact provider with any  episodes of hypoglycemia o Slowly increase exercise with goal of 30 minutes, 5 days per week o Eat a healthy diet high in vegetables, fruits and whole grains with low-fat dairy products, chicken, fish, legumes, non-tropical vegetable oils and nuts. Limit intake of sweets, sugar-sweetened beverages and red meats.  Medication Cost: . [place] Part D plans prefer [facility]. Prices may be lower at one of these preferred pharmacies. Call the number on the back of your insurance card to verify. You can also call [phone] to reach a local [place] counselor who will help you compare Medicare plans and answer any questions.  Initial goal documentation       Morgan Bowen was given information about Chronic Care Management services today including:  1. CCM service includes personalized support from designated clinical staff supervised by her physician, including individualized plan of care and coordination with other care providers 2. 24/7 contact phone numbers for assistance for urgent and routine care needs. 3. Standard insurance, coinsurance, copays and deductibles apply for chronic care management only during months in which we provide at least 20 minutes of these services. Most insurances cover these services at 100%, however patients may be responsible for any copay, coinsurance and/or deductible if applicable. This service may help you avoid the need for more expensive face-to-face services. 4. Only one practitioner may furnish and bill the service in a calendar month. 5.  The patient may stop CCM services at any time (effective at the end of the month) by phone call to the office staff.  Patient agreed to services and verbal consent obtained.   The patient verbalized understanding of instructions provided today and agreed to receive a mailed copy of patient instruction and/or educational materials. Telephone follow up appointment with pharmacy team member scheduled for:  May 01, 2020 at 9:30 AM (telephone)  [name], PharmD Clinical Pharmacist [facility] [phone]   Diabetes Mellitus and Nutrition, Adult When you have diabetes (diabetes mellitus), it is very important to have healthy eating habits because your blood sugar (glucose) levels are greatly affected by what you eat and drink. Eating healthy foods in the appropriate amounts, at about the same times every day, can help you:  Control your blood glucose.  Lower your risk of heart disease.  Improve your blood pressure.  Reach or maintain a healthy weight. Every person with diabetes is different, and each person has different needs for a meal plan. Your health care provider may recommend that you work with a diet and nutrition specialist (dietitian) to make a meal plan that is best for you. Your meal plan may vary depending on factors such as:  The calories you need.  The medicines you take.  Your weight.  Your blood glucose, blood pressure, and cholesterol levels.  Your activity level.  Other health conditions you have, such as heart or kidney disease. How do carbohydrates affect me? Carbohydrates, also called carbs, affect your blood glucose level more than any other type of food. Eating carbs naturally raises the amount of glucose in your blood. Carb counting is a method for keeping track of how many carbs you eat. Counting carbs is important to keep your blood glucose at a healthy level, especially if you use insulin or take certain oral diabetes medicines. It is important to know how many carbs you can safely have in each meal. This is different for every person. Your dietitian can help you calculate how many carbs you should have at each meal and for each snack. Foods that contain carbs include:  Bread, cereal, rice, pasta, and crackers.  Potatoes and corn.  Peas, beans, and lentils.  Milk and yogurt.  Fruit and juice.  Desserts, such as  cakes, cookies, ice cream, and candy. How does alcohol affect me? Alcohol can cause a sudden decrease in blood glucose (hypoglycemia), especially if you use insulin or take certain oral diabetes medicines. Hypoglycemia can be a life-threatening condition. Symptoms of hypoglycemia (sleepiness, dizziness, and confusion) are similar to symptoms of having too much alcohol. If your health care provider says that alcohol is safe for you, follow these guidelines:  Limit alcohol intake to no more than 1 drink per day for nonpregnant women and 2 drinks per day for men. One drink equals 12 oz of beer, 5 oz of wine, or 1 oz of hard liquor.  Do not drink on an empty stomach.  Keep yourself hydrated with water, diet soda, or unsweetened iced tea.  Keep in mind that regular soda, juice, and other mixers may contain a lot of sugar and must be counted as carbs. What are tips for following this plan?  Reading food labels  Start by checking the serving size on the "Nutrition Facts" label of packaged foods and drinks. The amount of calories, carbs, fats, and other nutrients listed on the label is based on one serving of the item. Many items  contain more than one serving per package.  Check the total grams (g) of carbs in one serving. You can calculate the number of servings of carbs in one serving by dividing the total carbs by 15. For example, if a food has 30 g of total carbs, it would be equal to 2 servings of carbs.  Check the number of grams (g) of saturated and trans fats in one serving. Choose foods that have low or no amount of these fats.  Check the number of milligrams (mg) of salt (sodium) in one serving. Most people should limit total sodium intake to less than 2,300 mg per day.  Always check the nutrition information of foods labeled as "low-fat" or "nonfat". These foods may be higher in added sugar or refined carbs and should be avoided.  Talk to your dietitian to identify your daily goals for  nutrients listed on the label. Shopping  Avoid buying canned, premade, or processed foods. These foods tend to be high in fat, sodium, and added sugar.  Shop around the outside edge of the grocery store. This includes fresh fruits and vegetables, bulk grains, fresh meats, and fresh dairy. Cooking  Use low-heat cooking methods, such as baking, instead of high-heat cooking methods like deep frying.  Cook using healthy oils, such as olive, canola, or sunflower oil.  Avoid cooking with butter, cream, or high-fat meats. Meal planning  Eat meals and snacks regularly, preferably at the same times every day. Avoid going long periods of time without eating.  Eat foods high in fiber, such as fresh fruits, vegetables, beans, and whole grains. Talk to your dietitian about how many servings of carbs you can eat at each meal.  Eat 4-6 ounces (oz) of lean protein each day, such as lean meat, chicken, fish, eggs, or tofu. One oz of lean protein is equal to: ? 1 oz of meat, chicken, or fish. ? 1 egg. ?  cup of tofu.  Eat some foods each day that contain healthy fats, such as avocado, nuts, seeds, and fish. Lifestyle  Check your blood glucose regularly.  Exercise regularly as told by your health care provider. This may include: ? 150 minutes of moderate-intensity or vigorous-intensity exercise each week. This could be brisk walking, biking, or water aerobics. ? Stretching and doing strength exercises, such as yoga or weightlifting, at least 2 times a week.  Take medicines as told by your health care provider.  Do not use any products that contain nicotine or tobacco, such as cigarettes and e-cigarettes. If you need help quitting, ask your health care provider.  Work with a Social worker or diabetes educator to identify strategies to manage stress and any emotional and social challenges. Questions to ask a health care provider  Do I need to meet with a diabetes educator?  Do I need to meet with a  dietitian?  What number can I call if I have questions?  When are the best times to check my blood glucose? Where to find more information:  American Diabetes Association: diabetes.org  Academy of Nutrition and Dietetics: www.eatright.[employer] of Diabetes and Digestive and Kidney Diseases (NIH): [employer] Summary  A healthy meal plan will help you control your blood glucose and maintain a healthy lifestyle.  Working with a diet and nutrition specialist (dietitian) can help you make a meal plan that is best for you.  Keep in mind that carbohydrates (carbs) and alcohol have immediate effects on your blood glucose levels. It is important to  count carbs and to use alcohol carefully. This information is not intended to replace advice given to you by your health care provider. Make sure you discuss any questions you have with your health care provider. Document Revised: 07/08/2017 Document Reviewed: 08/30/2016 Elsevier Patient Education  2020 [employer].

## 2020-02-14 NOTE — Progress Notes (Signed)
I have collaborated with the care management provider regarding care management and care coordination activities outlined in this encounter and have reviewed this encounter including documentation in the note and care plan. I am certifying that I agree with the content of this note and encounter as supervising physician.  

## 2020-02-21 ENCOUNTER — Other Ambulatory Visit: Payer: Self-pay | Admitting: Primary Care

## 2020-02-21 DIAGNOSIS — E559 Vitamin D deficiency, unspecified: Secondary | ICD-10-CM

## 2020-02-21 DIAGNOSIS — E785 Hyperlipidemia, unspecified: Secondary | ICD-10-CM

## 2020-02-21 DIAGNOSIS — E119 Type 2 diabetes mellitus without complications: Secondary | ICD-10-CM

## 2020-02-21 DIAGNOSIS — I10 Essential (primary) hypertension: Secondary | ICD-10-CM

## 2020-02-25 ENCOUNTER — Telehealth: Payer: Self-pay | Admitting: Primary Care

## 2020-02-25 NOTE — Progress Notes (Signed)
  Chronic Care Management   Outreach Note  02/25/2020 Name: Morgan Bowen MRN: [id number] DOB: 04/24/1936  Referred by: [name], NP Reason for referral : No chief complaint on file.   An unsuccessful telephone outreach was attempted today. The patient was referred to the pharmacist for assistance with care management and care coordination.   Follow Up Plan:   Earney [place] [employer] Scheduler

## 2020-03-04 ENCOUNTER — Other Ambulatory Visit: Payer: Self-pay | Admitting: Primary Care

## 2020-03-04 DIAGNOSIS — M810 Age-related osteoporosis without current pathological fracture: Secondary | ICD-10-CM

## 2020-03-04 NOTE — Telephone Encounter (Signed)
Last prescribed on 09/30/2019 Last OV (follow up ) with Allie Bossier on 10/02/2019 Future OV scheduled on 04/03/2020 and lab appt on 03/31/2020

## 2020-03-05 NOTE — Telephone Encounter (Signed)
Refills sent to pharmacy. 

## 2020-03-10 ENCOUNTER — Other Ambulatory Visit: Payer: Self-pay | Admitting: Primary Care

## 2020-03-10 DIAGNOSIS — I10 Essential (primary) hypertension: Secondary | ICD-10-CM

## 2020-03-26 ENCOUNTER — Telehealth: Payer: Self-pay | Admitting: *Deleted

## 2020-03-26 NOTE — Telephone Encounter (Signed)
Noted, patient to see Webb Silversmith on 03/27/20.

## 2020-03-26 NOTE — Telephone Encounter (Signed)
After speaking with Allie Bossier NP patient was called and advised if the support hose helps her calf to feel better to continue to wear then.  Patient verbalized understanding.

## 2020-03-26 NOTE — Telephone Encounter (Signed)
Patient called stating that she started with pain in her right calf Saturday. Patient stated  the only thing that she did different Saturday was wear a new pair of shoes for about 30 minutes. Patient stated that her calf is not red or swollen. Patient denies any SOB or difficulty breathing. Patient stated that when she puts her leg on the hammock it is painful. Patient stated that the pain comes and goes and at times there is tingling in her right foot. Patient stated the pain level is a 3-4 .Patient stated that she is worried that she may have a blood clot. Patient denies ever having a blood clot previously. Patient stated that she has a retired Marine scientist that lives above her and she told her to get her leg checked out.  After speaking with Allie Bossier NP patient was scheduled for an appointment with Webb Silversmith NP tomorrow. Patient was given ER precautions and she verbalized understanding.  Patient stated that she has been wearing support knee highs and wants to know if she should continue wearing them until her appointment tomorrow.

## 2020-03-26 NOTE — Telephone Encounter (Signed)
Re route to PCP

## 2020-03-27 ENCOUNTER — Other Ambulatory Visit: Payer: Self-pay

## 2020-03-27 ENCOUNTER — Ambulatory Visit (INDEPENDENT_AMBULATORY_CARE_PROVIDER_SITE_OTHER): Payer: Medicare Other | Admitting: Internal Medicine

## 2020-03-27 ENCOUNTER — Encounter: Payer: Self-pay | Admitting: Internal Medicine

## 2020-03-27 ENCOUNTER — Ambulatory Visit
Admission: RE | Admit: 2020-03-27 | Discharge: 2020-03-27 | Disposition: A | Discharge status: Home / Self Care | Payer: Medicare Other | Source: Ambulatory Visit | Attending: Internal Medicine | Admitting: Internal Medicine

## 2020-03-27 ENCOUNTER — Other Ambulatory Visit: Payer: Self-pay | Admitting: Primary Care

## 2020-03-27 VITALS — BP 134/70 | HR 86 | Ht 65.0 in | Wt 128.0 lb

## 2020-03-27 DIAGNOSIS — M79661 Pain in right lower leg: Secondary | ICD-10-CM

## 2020-03-27 DIAGNOSIS — I82441 Acute embolism and thrombosis of right tibial vein: Secondary | ICD-10-CM | POA: Diagnosis not present

## 2020-03-27 MED ORDER — APIXABAN 5 MG PO TABS: ORAL_TABLET | ORAL | 0 refills | Status: DC

## 2020-03-27 NOTE — Progress Notes (Signed)
Subjective:    Patient ID: Morgan Bowen, female    DOB: 04-29-1936, 84 y.o.   MRN: [id number]  HPI  Pt presents to the clinic today with c/o right calf pain. This started 5 days ago. She describes the pain as pressure. The pain is worse with ambulation. She is unsure if the pain radiates at all. She does have some chronic tingling in her right foot which she does feel like is worse the last few days. She has not noticed any swelling, redness or warmth. She denies any injury to the area but did walk around in some brand new shoes from 30 minutes to 1 hour. She has tried elevating her leg which caused her pain. She has not tried any medications OTC.  Review of Systems      Past Medical History:  Diagnosis Date  . Acute cystitis 05/23/2018  . Anxiety   . Arthritis   . Cellulitis 12/13/2018  . Chickenpox   . Diabetes mellitus without complication ([place])   . GERD (gastroesophageal reflux disease)   . Hypertension   . Osteoporosis   . Type 2 diabetes mellitus ([place])   . Urinary tract infection     Current Outpatient Medications  Medication Sig Dispense Refill  . amLODipine (NORVASC) 10 MG tablet Take 1 tablet (10 mg total) by mouth daily. For blood pressure. 90 tablet 3  . cloNIDine (CATAPRES) 0.1 MG tablet TAKE 1 TABLET BY MOUTH ONCE DAILY AS NEEDED FOR SYSTOLIC BLOOD PRESSURE ABOVE 170 (Patient not taking: Reported on 01/30/2020) 30 tablet 1  . glimepiride (AMARYL) 2 MG tablet Take 1.5 tablets (3 mg total) by mouth daily with breakfast. 135 tablet 3  . glucose blood (ONE TOUCH ULTRA TEST) test strip USE AS DIRECTED TEST 3 TIMES DAILY E11.9 100 each 2  . irbesartan (AVAPRO) 300 MG tablet TAKE 1 TABLET BY MOUTH EVERY DAY 90 tablet 1  . metFORMIN (GLUCOPHAGE) 500 MG tablet TAKE 1 TABLET BY MOUTH TWICE DAILY WITH FOOD FOR DIABETES. 180 tablet 1  . metoprolol succinate (TOPROL-XL) 25 MG 24 hr tablet TAKE 1 TABLET BY MOUTH EVERY DAY FOR BLOOD PRESSURE 90 tablet 1  . mupirocin ointment  (BACTROBAN) 2 % mupirocin 2 % topical ointment    . triamcinolone cream (KENALOG) 0.1 % Apply 1 application topically 2 (two) times daily. 30 g 0  . UNABLE TO FIND USE AS DIRECTED 3 TIMES A DAY    . Vitamin D, Ergocalciferol, (DRISDOL) 1.25 MG (50000 UNIT) CAPS capsule TAKE 1 CAPSULE BY MOUTH ONE TIME PER WEEK 12 capsule 0   No current facility-administered medications for this visit.    Allergies  Allergen Reactions  . Sulfamethoxazole-Trimethoprim   . Penicillins Rash    Family History  Problem Relation Age of Onset  . Hypertension Mother   . Stroke Mother   . Stroke Father   . Hypertension Father   . Hypertension Sister   . Kidney disease Sister   . Breast cancer Neg Hx        Constitutional: Denies fever, malaise, fatigue, headache or abrupt weight changes.  Respiratory: Denies difficulty breathing, shortness of breath, cough or sputum production.   Cardiovascular: Denies chest pain, chest tightness, palpitations or swelling in the hands or feet.  Musculoskeletal: Pt reports right calf pain. Denies decrease in range of motion, difficulty with gait, or joint pain and swelling.  Skin: Denies redness, rashes, lesions or ulcercations.  Neurological: Pt reports tingling, right foot. Denies numbness, weakness or problems  with coordination.    No other specific complaints in a complete review of systems (except as listed in HPI above).  Objective:   Physical Exam BP 134/70 (BP Location: Left Arm, Patient Position: Sitting, Cuff Size: Normal)   Pulse 86   Ht 5\' 5"  (1.651 m)   Wt 128 lb (58.1 kg)   SpO2 97%   BMI 21.30 kg/m   Wt Readings from Last 3 Encounters:  11/21/19 130 lb (59 kg)  10/02/19 132 lb 8 oz (60.1 kg)  06/11/19 130 lb 4 oz (59.1 kg)    General: Appears her stated age, well developed, well nourished in NAD. Skin: Warm, dry and intact. No redness or warmth noted. Cardiovascular: Normal rate and rhythm. S1,S2 noted.  No murmur, rubs or gallops noted. No  JVD or BLE edema. Pedal pulse 2+ bilaterally. Negative Homan's sign on the right. Pulmonary/Chest: Normal effort and positive vesicular breath sounds. No respiratory distress. No wheezes, rales or ronchi noted.  Musculoskeletal: Normal flexion and extension of the right knee. No joint swelling noted. Pain with palpation over the posterior calf. Strength 5/5 BLE. Gait slow and steady with use of cane. Neurological: Alert and oriented.   BMET    Component Value Date/Time   NA 136 10/02/2019 0834   K 4.4 10/02/2019 0834   CL 103 10/02/2019 0834   CO2 28 10/02/2019 0834   GLUCOSE 260 (H) 10/02/2019 0834   BUN 13 10/02/2019 0834   CREATININE 0.94 10/02/2019 0834   CALCIUM 9.2 10/02/2019 0834   GFRNONAA 60 (L) 06/02/2019 0059   GFRAA >60 06/02/2019 0059    Lipid Panel     Component Value Date/Time   CHOL 204 (H) 03/27/2019 0817   TRIG 190.0 (H) 03/27/2019 0817   HDL 46.80 03/27/2019 0817   CHOLHDL 4 03/27/2019 0817   VLDL 38.0 03/27/2019 0817   LDLCALC 119 (H) 03/27/2019 0817    CBC    Component Value Date/Time   WBC 9.5 06/02/2019 0059   RBC 4.16 06/02/2019 0059   HGB 13.0 06/02/2019 0059   HCT 38.2 06/02/2019 0059   PLT 288 06/02/2019 0059   MCV 91.8 06/02/2019 0059   MCH 31.3 06/02/2019 0059   MCHC 34.0 06/02/2019 0059   RDW 12.8 06/02/2019 0059   LYMPHSABS 2.7 06/02/2019 0059   MONOABS 0.7 06/02/2019 0059   EOSABS 0.2 06/02/2019 0059   BASOSABS 0.1 06/02/2019 0059    Hgb A1C Lab Results  Component Value Date   HGBA1C 8.6 (H) 10/02/2019            Assessment & Plan:  Right Calf Pain:  Muscular vs DVT US venous doppler RLE today to r/o DVT  Will follow up after ultrasound results to discuss symptoms and treatment plan, return precautions discussed  Webb Silversmith, NP This visit occurred during the SARS-CoV-2 public health emergency.  Safety protocols were in place, including screening questions prior to the visit, additional usage of staff PPE, and  extensive cleaning of exam room while observing appropriate contact time as indicated for disinfecting solutions.

## 2020-03-31 ENCOUNTER — Ambulatory Visit: Payer: Medicare Other

## 2020-03-31 ENCOUNTER — Other Ambulatory Visit (INDEPENDENT_AMBULATORY_CARE_PROVIDER_SITE_OTHER): Payer: Medicare Other

## 2020-03-31 ENCOUNTER — Other Ambulatory Visit: Payer: Self-pay

## 2020-03-31 DIAGNOSIS — I10 Essential (primary) hypertension: Secondary | ICD-10-CM | POA: Diagnosis not present

## 2020-03-31 DIAGNOSIS — E559 Vitamin D deficiency, unspecified: Secondary | ICD-10-CM | POA: Diagnosis not present

## 2020-03-31 DIAGNOSIS — E785 Hyperlipidemia, unspecified: Secondary | ICD-10-CM

## 2020-03-31 DIAGNOSIS — E119 Type 2 diabetes mellitus without complications: Secondary | ICD-10-CM | POA: Diagnosis not present

## 2020-03-31 LAB — LIPID PANEL
Cholesterol: 174 mg/dL (ref 0–200)
HDL: 42.9 mg/dL (ref >39.00)
LDL Cholesterol: 96 mg/dL (ref 0–99)
NonHDL: 130.88
Total CHOL/HDL Ratio: 4
Triglycerides: 172 mg/dL — ABNORMAL HIGH (ref 0.0–149.0)
VLDL: 34.4 mg/dL (ref 0.0–40.0)

## 2020-03-31 LAB — COMPREHENSIVE METABOLIC PANEL
ALT: 16 U/L (ref 0–35)
AST: 17 U/L (ref 0–37)
Albumin: 4.2 g/dL (ref 3.5–5.2)
Alkaline Phosphatase: 64 U/L (ref 39–117)
BUN: 20 mg/dL (ref 6–23)
CO2: 25 mEq/L (ref 19–32)
Calcium: 9.4 mg/dL (ref 8.4–10.5)
Chloride: 102 mEq/L (ref 96–112)
Creatinine, Ser: 0.94 mg/dL (ref 0.40–1.20)
GFR: 56.99 mL/min — ABNORMAL LOW (ref >60.00)
Glucose, Bld: 164 mg/dL — ABNORMAL HIGH (ref 70–99)
Potassium: 4.5 mEq/L (ref 3.5–5.1)
Sodium: 137 mEq/L (ref 135–145)
Total Bilirubin: 0.6 mg/dL (ref 0.2–1.2)
Total Protein: 7.7 g/dL (ref 6.0–8.3)

## 2020-03-31 LAB — HEMOGLOBIN A1C: Hgb A1c MFr Bld: 7.7 % — ABNORMAL HIGH (ref 4.6–6.5)

## 2020-03-31 LAB — VITAMIN D 25 HYDROXY (VIT D DEFICIENCY, FRACTURES): VITD: 67.95 ng/mL (ref 30.00–100.00)

## 2020-04-01 ENCOUNTER — Ambulatory Visit (INDEPENDENT_AMBULATORY_CARE_PROVIDER_SITE_OTHER): Payer: Medicare Other

## 2020-04-01 DIAGNOSIS — Z0000 Encounter for general adult medical examination without abnormal findings: Secondary | ICD-10-CM

## 2020-04-01 NOTE — Progress Notes (Signed)
Subjective:   Morgan Bowen is a 84 y.o. female who presents for Medicare Annual (Subsequent) preventive examination.  Review of Systems: N/A      I connected with the patient today by telephone and verified that I am speaking with the correct person using two identifiers. Location patient: home Location nurse: work Persons participating in the telephone visit: patient, nurse.   I discussed the limitations, risks, security and privacy concerns of performing an evaluation and management service by telephone and the availability of in person appointments. I also discussed with the patient that there may be a patient responsible charge related to this service. The patient expressed understanding and verbally consented to this telephonic visit.        Cardiac Risk Factors include: advanced age (>87men, >61 women);diabetes mellitus;hypertension     Objective:    Today's Vitals   04/01/20 1320  PainSc: 3    There is no height or weight on file to calculate BMI.  Advanced Directives 04/01/2020 06/01/2019 03/27/2019 06/06/2018 06/05/2018 03/16/2018  Does Patient Have a Medical Advance Directive? Yes Yes Yes Yes Yes Yes  Type of Paramedic of [place];Living will Healthcare Power of [place];Living will Living will;Healthcare Power of [place];Living will  Copy of [place] in Chart? No - copy requested - No - copy requested - - No - copy requested    Current Medications (verified) Outpatient Encounter Medications as of 04/01/2020  Medication Sig  . amLODipine (NORVASC) 10 MG tablet Take 1 tablet (10 mg total) by mouth daily. For blood pressure.  Marland Kitchen apixaban (ELIQUIS) 5 MG TABS tablet Take 2 tablets by mouth twice daily for 10 days, then take 1 tablet twice daily thereafter. For blood clot.  . cloNIDine (CATAPRES) 0.1 MG tablet TAKE 1 TABLET BY MOUTH ONCE DAILY AS  NEEDED FOR SYSTOLIC BLOOD PRESSURE ABOVE 170  . glimepiride (AMARYL) 2 MG tablet Take 1.5 tablets (3 mg total) by mouth daily with breakfast.  . glucose blood (ONE TOUCH ULTRA TEST) test strip USE AS DIRECTED TEST 3 TIMES DAILY E11.9  . irbesartan (AVAPRO) 300 MG tablet TAKE 1 TABLET BY MOUTH EVERY DAY  . metFORMIN (GLUCOPHAGE) 500 MG tablet TAKE 1 TABLET BY MOUTH TWICE DAILY WITH FOOD FOR DIABETES.  . metoprolol succinate (TOPROL-XL) 25 MG 24 hr tablet TAKE 1 TABLET BY MOUTH EVERY DAY FOR BLOOD PRESSURE  . mupirocin ointment (BACTROBAN) 2 % mupirocin 2 % topical ointment  . triamcinolone cream (KENALOG) 0.1 % Apply 1 application topically 2 (two) times daily.  Marland Kitchen UNABLE TO FIND USE AS DIRECTED 3 TIMES A DAY  . Vitamin D, Ergocalciferol, (DRISDOL) 1.25 MG (50000 UNIT) CAPS capsule TAKE 1 CAPSULE BY MOUTH ONE TIME PER WEEK   No facility-administered encounter medications on file as of 04/01/2020.    Allergies (verified) Sulfamethoxazole-trimethoprim and Penicillins   History: Past Medical History:  Diagnosis Date  . Acute cystitis 05/23/2018  . Anxiety   . Arthritis   . Cellulitis 12/13/2018  . Chickenpox   . Diabetes mellitus without complication ([place])   . GERD (gastroesophageal reflux disease)   . Hypertension   . Osteoporosis   . Type 2 diabetes mellitus ([place])   . Urinary tract infection    Past Surgical History:  Procedure Laterality Date  . ABDOMINAL HYSTERECTOMY  1984  . ABDOMINAL HYSTERECTOMY    . APPENDECTOMY  1958  . CHOLECYSTECTOMY    .  GALLBLADDER SURGERY    . MANDIBLE FRACTURE SURGERY  07/13/2016  . TONSILLECTOMY  1958  . TONSILLECTOMY    . WRIST FRACTURE SURGERY Right    2000   Family History  Problem Relation Age of Onset  . Hypertension Mother   . Stroke Mother   . Stroke Father   . Hypertension Father   . Hypertension Sister   . Kidney disease Sister   . Breast cancer Neg Hx    Social History   Socioeconomic History  . Marital status: Widowed     Spouse name: Not on file  . Number of children: Not on file  . Years of education: Not on file  . Highest education level: Not on file  Occupational History  . Not on file  Tobacco Use  . Smoking status: Never Smoker  . Smokeless tobacco: Never Used  Vaping Use  . Vaping Use: Never used  Substance and Sexual Activity  . Alcohol use: No  . Drug use: No  . Sexual activity: Not Currently  Other Topics Concern  . Not on file  Social History Narrative   ** Merged History Encounter **       Widowed. Moved from [place]. 1 son, 3 grandchildren. Retired. Once worked as a Agricultural engineer.    Social Determinants of Health   Financial Resource Strain: Low Risk   . Difficulty of Paying Living Expenses: Not hard at all  Food Insecurity: No Food Insecurity  . Worried About Charity fundraiser in the Last Year: Never true  . Ran Out of Food in the Last Year: Never true  Transportation Needs: No Transportation Needs  . Lack of Transportation (Medical): No  . Lack of Transportation (Non-Medical): No  Physical Activity: Inactive  . Days of Exercise per Week: 0 days  . Minutes of Exercise per Session: 0 min  Stress: No Stress Concern Present  . Feeling of Stress : Not at all  Social Connections:   . Frequency of Communication with Friends and Family: Not on file  . Frequency of Social Gatherings with Friends and Family: Not on file  . Attends Religious Services: Not on file  . Active Member of Clubs or Organizations: Not on file  . Attends Archivist Meetings: Not on file  . Marital Status: Not on file    Tobacco Counseling Counseling given: Not Answered   Clinical Intake:  Pre-visit preparation completed: Yes  Pain : 0-10 Pain Score: 3  Pain Type: Chronic pain Pain Location: Leg Pain Orientation: Right Pain Descriptors / Indicators: Aching Pain Onset: More than a month ago Pain Frequency: Intermittent     Nutritional Risks: None Diabetes: Yes CBG done?:  No Did pt. bring in CBG monitor from home?: No  How often do you need to have someone help you when you read instructions, pamphlets, or other written materials from your doctor or pharmacy?: 1 - Never What is the last grade level you completed in school?: 12th  Diabetic: Yes Nutrition Risk Assessment:  Has the patient had any N/V/D within the last 2 months?  No  Does the patient have any non-healing wounds?  No  Has the patient had any unintentional weight loss or weight gain?  No   Diabetes:  Is the patient diabetic?  Yes  If diabetic, was a CBG obtained today?  No  Did the patient bring in their glucometer from home?  No  How often do you monitor your CBG's? Twice daily.   Financial  Strains and Diabetes Management:  Are you having any financial strains with the device, your supplies or your medication? No .  Does the patient want to be seen by Chronic Care Management for management of their diabetes?  No  Would the patient like to be referred to a Nutritionist or for Diabetic Management?  No   Diabetic Exams:  Diabetic Eye Exam: Completed 08/01/2019 Diabetic Foot Exam: Completed 01/17/2020   Interpreter Needed?: No  Information entered by :: CJohnson, LPN   Activities of Daily Living In your present state of health, do you have any difficulty performing the following activities: 04/01/2020  Hearing? N  Vision? N  Difficulty concentrating or making decisions? N  Walking or climbing stairs? N  Dressing or bathing? N  Doing errands, shopping? N  Preparing Food and eating ? N  Using the Toilet? N  In the past six months, have you accidently leaked urine? N  Do you have problems with loss of bowel control? N  Managing your Medications? N  Managing your Finances? N  Housekeeping or managing your Housekeeping? N  Some recent data might be hidden    Patient Care Team: [name], NP as PCP - General (Internal Medicine) Carlis Abbott Leticia Penna, NP (Internal  Medicine) [name], [facility] as Pharmacist (Pharmacist)  Indicate any recent Medical Services you may have received from other than [facility] providers in the past year (date may be approximate).     Assessment:   This is a routine wellness examination for [place].  Hearing/Vision screen  Hearing Screening   125Hz  250Hz  500Hz  1000Hz  2000Hz  3000Hz  4000Hz  6000Hz  8000Hz   Right ear:           Left ear:           Vision Screening Comments: Patient gets annual eye exams   Dietary issues and exercise activities discussed: Current Exercise Habits: The patient does not participate in regular exercise at present, Exercise limited by: None identified  Goals    . Chronic Care Management     CARE PLAN ENTRY  Current Barriers:  . Chronic Disease Management support, education, and care coordination needs related to Hypertension and Diabetes   Hypertension BP Readings from Last 3 Encounters:  11/21/19 (!) 154/72  10/02/19 140/80  06/11/19 (!) 148/80 .  Pharmacist Clinical Goal(s): o Over the next 12 weeks, patient will work with PharmD and providers to maintain BP goal <140/90 mmHg . Current regimen:  . Amlodipine 10 mg - 1 tablet once daily . Irbesartan 300 mg - 1 tablet once daily . Metoprolol succinate 25 mg - 1 tablet once daily . Clonidine 0.1 mg  - 1 tablet daily for systolic blood pressure above 170 . Interventions: o Reviewed home blood pressure readings . Patient self care activities - Over the next 12 weeks, patient will: o Continue to monitor blood pressure several days per week, document, and provide at future appointments o Ensure daily salt intake < 2300 mg/day  Diabetes Lab Results  Component Value Date/Time   HGBA1C 8.6 (H) 10/02/2019 08:34 AM   HGBA1C 7.9 (A) 07/02/2019 08:52 AM   HGBA1C 8.1 (H) 03/27/2019 08:17 AM .  Pharmacist Clinical Goal(s): o Over the next 12 weeks, patient will work with PharmD and providers to achieve A1c goal <7% . Current regimen:   Marland Kitchen Glimepiride 2 mg - 1 and 1/2 tablet once daily with breakfast . Metformin 500 mg - 1 tablet twice daily with food  . Interventions: o Discuss dietary and exercise  goals  . Patient self care activities - Over the next 12 weeks, patient will: o Check blood sugar once daily, document, and provide at future appointment o Contact provider with any episodes of hypoglycemia o Slowly increase exercise with goal of 30 minutes, 5 days per week o Eat a healthy diet high in vegetables, fruits and whole grains with low-fat dairy products, chicken, fish, legumes, non-tropical vegetable oils and nuts. Limit intake of sweets, sugar-sweetened beverages and red meats.  Medication Cost: . [place] Part D plans prefer Higher education careers adviser. Prices may be lower at one of these preferred pharmacies. Call the number on the back of your insurance card to verify. You can also call [phone] to reach a local [place] counselor who will help you compare Medicare plans and answer any questions.  Initial goal documentation    . Patient Stated     Starting 03/16/2018, I will continue to take medications as prescribed.     . Patient Stated     03/27/2019, to remain healthy    . Patient Stated     04/01/2020, I will maintain and continue medications as prescribed.       Depression Screen PHQ 2/9 Scores 04/01/2020 05/15/2019 03/27/2019 03/16/2018 08/26/2017  PHQ - 2 Score 0 0 0 0 3  PHQ- 9 Score 0 2 0 0 6    Fall Risk Fall Risk  04/01/2020 03/27/2019 03/16/2018 10/24/2017  Falls in the past year? 0 0 No Yes  Number falls in past yr: 0 - - 1  Injury with Fall? 0 - - Yes  Risk for fall due to : History of fall(s);Medication side effect Impaired balance/gait;Medication side effect - -  Follow up Falls evaluation completed;Falls prevention discussed Falls evaluation completed;Falls prevention discussed - -    Any stairs in or around the home? Yes  If so, are there any without handrails? No  Home free of loose  throw rugs in walkways, pet beds, electrical cords, etc? Yes  Adequate lighting in your home to reduce risk of falls? Yes   ASSISTIVE DEVICES UTILIZED TO PREVENT FALLS:  Life alert? No  Use of a cane, walker or w/c? Yes Grab bars in the bathroom? No  Shower chair or bench in shower? No  Elevated toilet seat or a handicapped toilet? No   TIMED UP AND GO:  Was the test performed? N/A, telephonic visit .    Cognitive Function: MMSE - Mini Mental State Exam 04/01/2020 03/27/2019 03/16/2018  Orientation to time 5 5 5   Orientation to Place 5 5 5   Registration 3 3 3   Attention/ Calculation 5 5 0  Recall 3 3 3   Language- name 2 objects - 0 0  Language- repeat 1 1 1   Language- follow 3 step command - 0 3  Language- read & follow direction - 0 0  Write a sentence - 0 0  Copy design - 0 0  Total score - 22 20  Mini Cog  Mini-Cog screen was completed. Maximum score is 22. A value of 0 denotes this part of the MMSE was not completed or the patient failed this part of the Mini-Cog screening.       Immunizations Immunization History  Administered Date(s) Administered  . Influenza, High Dose Seasonal PF 05/09/2012  . Influenza,inj,Quad PF,6+ Mos 03/30/2019  . Pneumococcal Conjugate-13 10/24/2017  . Pneumococcal Polysaccharide-23 08/23/2013  . Tdap 07/14/2015  . Zoster 08/06/2013, 12/08/2014    TDAP status: Up to date Flu Vaccine status:  due, Patient will get sometime in October she states Pneumococcal vaccine status: Up to date Covid-19 vaccine status: Declined, Education has been provided regarding the importance of this vaccine but patient still declined. Advised may receive this vaccine at local pharmacy or Health Dept.or vaccine clinic. Aware to provide a copy of the vaccination record if obtained from local pharmacy or Health Dept. Verbalized acceptance and understanding.  Qualifies for Shingles Vaccine? Yes   Zostavax completed Yes   Shingrix Completed?: No.    Education has  been provided regarding the importance of this vaccine. Patient has been advised to call insurance company to determine out of pocket expense if they have not yet received this vaccine. Advised may also receive vaccine at local pharmacy or Health Dept. Verbalized acceptance and understanding.  Screening Tests Health Maintenance  Topic Date Due  . INFLUENZA VACCINE  03/09/2020  . COVID-19 Vaccine (1) 04/17/2020 (Originally 02/10/1948)  . OPHTHALMOLOGY EXAM  07/31/2020  . HEMOGLOBIN A1C  10/01/2020  . FOOT EXAM  01/16/2021  . TETANUS/TDAP  07/13/2025  . DEXA SCAN  Completed  . PNA vac Low Risk Adult  Completed    Health Maintenance  Health Maintenance Due  Topic Date Due  . INFLUENZA VACCINE  03/09/2020    Colorectal cancer screening: No longer required.  Mammogram status: Completed 06/04/2019. Repeat every year Bone Density status: Completed 07/23/2019. Results reflect: Bone density results: OSTEOPOROSIS. Repeat every 2 years.  Lung Cancer Screening: (Low Dose CT Chest recommended if Age 24-80 years, 30 pack-year currently smoking OR have quit w/in 15years.) does not qualify.    Additional Screening:  Hepatitis C Screening: does not qualify; Completed N/A  Vision Screening: Recommended annual ophthalmology exams for early detection of glaucoma and other disorders of the eye. Is the patient up to date with their annual eye exam?  Yes  Who is the provider or what is the name of the office in which the patient attends annual eye exams? Dr. Marvel Plan  If pt is not established with a provider, would they like to be referred to a provider to establish care? No .   Dental Screening: Recommended annual dental exams for proper oral hygiene  Community Resource Referral / Chronic Care Management: CRR required this visit?  No   CCM required this visit?  No      Plan:     I have personally reviewed and noted the following in the patient's chart:   . Medical and social  history . Use of alcohol, tobacco or illicit drugs  . Current medications and supplements . Functional ability and status . Nutritional status . Physical activity . Advanced directives . List of other physicians . Hospitalizations, surgeries, and ER visits in previous 12 months . Vitals . Screenings to include cognitive, depression, and falls . Referrals and appointments  In addition, I have reviewed and discussed with patient certain preventive protocols, quality metrics, and best practice recommendations. A written personalized care plan for preventive services as well as general preventive health recommendations were provided to patient.   Due to this being a telephonic visit, the after visit summary with patients personalized plan was offered to patient via mail or my-chart. Patient preferred to pick up at office at next visit.   Andrez Grime, LPN   [phone]

## 2020-04-01 NOTE — Progress Notes (Signed)
PCP notes:  Health Maintenance: Flu- due, will get in October she states COVID- declined   Abnormal Screenings: none   Patient concerns: none   Nurse concerns: none   Next PCP appt.: 04/03/2020 @ 9 am

## 2020-04-01 NOTE — Patient Instructions (Signed)
Morgan Bowen , Thank you for taking time to come for your Medicare Wellness Visit. I appreciate your ongoing commitment to your health goals. Please review the following plan we discussed and let me know if I can assist you in the future.   Screening recommendations/referrals: Colonoscopy: no longer required Mammogram: Up to date, completed 06/04/2019, due 05/2020 Bone Density: Up to date, completed 07/23/2019, due 07/2021 Recommended yearly ophthalmology/optometry visit for glaucoma screening and checkup Recommended yearly dental visit for hygiene and checkup  Vaccinations: Influenza vaccine: due, will get this in October  Pneumococcal vaccine: Completed series Tdap vaccine: Up to date, completed 07/14/2015 due 07/2025 Shingles vaccine: due, check with your insurance regarding coverage    Covid-19:declined  Advanced directives: Please bring a copy of your POA (Power of Attorney) and/or Living Will to your next appointment.   Conditions/risks identified: diabetes, hypertension  Next appointment: Follow up in one year for your annual wellness visit    Preventive Care 82 Years and Older, Female Preventive care refers to lifestyle choices and visits with your health care provider that can promote health and wellness. What does preventive care include?  A yearly physical exam. This is also called an annual well check.  Dental exams once or twice a year.  Routine eye exams. Ask your health care provider how often you should have your eyes checked.  Personal lifestyle choices, including:  Daily care of your teeth and gums.  Regular physical activity.  Eating a healthy diet.  Avoiding tobacco and drug use.  Limiting alcohol use.  Practicing safe sex.  Taking low-dose aspirin every day.  Taking vitamin and mineral supplements as recommended by your health care provider. What happens during an annual well check? The services and screenings done by your health care provider  during your annual well check will depend on your age, overall health, lifestyle risk factors, and family history of disease. Counseling  Your health care provider may ask you questions about your:  Alcohol use.  Tobacco use.  Drug use.  Emotional well-being.  Home and relationship well-being.  Sexual activity.  Eating habits.  History of falls.  Memory and ability to understand (cognition).  Work and work Statistician.  Reproductive health. Screening  You may have the following tests or measurements:  Height, weight, and BMI.  Blood pressure.  Lipid and cholesterol levels. These may be checked every 5 years, or more frequently if you are over 82 years old.  Skin check.  Lung cancer screening. You may have this screening every year starting at age 82 if you have a 30-pack-year history of smoking and currently smoke or have quit within the past 15 years.  Fecal occult blood test (FOBT) of the stool. You may have this test every year starting at age 82.  Flexible sigmoidoscopy or colonoscopy. You may have a sigmoidoscopy every 5 years or a colonoscopy every 10 years starting at age 82.  Hepatitis C blood test.  Hepatitis B blood test.  Sexually transmitted disease (STD) testing.  Diabetes screening. This is done by checking your blood sugar (glucose) after you have not eaten for a while (fasting). You may have this done every 1-3 years.  Bone density scan. This is done to screen for osteoporosis. You may have this done starting at age 82.  Mammogram. This may be done every 1-2 years. Talk to your health care provider about how often you should have regular mammograms. Talk with your health care provider about your test results, treatment options,  and if necessary, the need for more tests. Vaccines  Your health care provider may recommend certain vaccines, such as:  Influenza vaccine. This is recommended every year.  Tetanus, diphtheria, and acellular pertussis  (Tdap, Td) vaccine. You may need a Td booster every 10 years.  Zoster vaccine. You may need this after age 82.  Pneumococcal 13-valent conjugate (PCV13) vaccine. One dose is recommended after age 82.  Pneumococcal polysaccharide (PPSV23) vaccine. One dose is recommended after age 82. Talk to your health care provider about which screenings and vaccines you need and how often you need them. This information is not intended to replace advice given to you by your health care provider. Make sure you discuss any questions you have with your health care provider. Document Released: 08/22/2015 Document Revised: 04/14/2016 Document Reviewed: 05/27/2015 Elsevier Interactive Patient Education  2017 [place] Prevention in the Home Falls can cause injuries. They can happen to people of all ages. There are many things you can do to make your home safe and to help prevent falls. What can I do on the outside of my home?  Regularly fix the edges of walkways and driveways and fix any cracks.  Remove anything that might make you trip as you walk through a door, such as a raised step or threshold.  Trim any bushes or trees on the path to your home.  Use bright outdoor lighting.  Clear any walking paths of anything that might make someone trip, such as rocks or tools.  Regularly check to see if handrails are loose or broken. Make sure that both sides of any steps have handrails.  Any raised decks and porches should have guardrails on the edges.  Have any leaves, snow, or ice cleared regularly.  Use sand or salt on walking paths during winter.  Clean up any spills in your garage right away. This includes oil or grease spills. What can I do in the bathroom?  Use night lights.  Install grab bars by the toilet and in the tub and shower. Do not use towel bars as grab bars.  Use non-skid mats or decals in the tub or shower.  If you need to sit down in the shower, use a plastic, non-slip  stool.  Keep the floor dry. Clean up any water that spills on the floor as soon as it happens.  Remove soap buildup in the tub or shower regularly.  Attach bath mats securely with double-sided non-slip rug tape.  Do not have throw rugs and other things on the floor that can make you trip. What can I do in the bedroom?  Use night lights.  Make sure that you have a light by your bed that is easy to reach.  Do not use any sheets or blankets that are too big for your bed. They should not hang down onto the floor.  Have a firm chair that has side arms. You can use this for support while you get dressed.  Do not have throw rugs and other things on the floor that can make you trip. What can I do in the kitchen?  Clean up any spills right away.  Avoid walking on wet floors.  Keep items that you use a lot in easy-to-reach places.  If you need to reach something above you, use a strong step stool that has a grab bar.  Keep electrical cords out of the way.  Do not use floor polish or wax that makes floors slippery. If  you must use wax, use non-skid floor wax.  Do not have throw rugs and other things on the floor that can make you trip. What can I do with my stairs?  Do not leave any items on the stairs.  Make sure that there are handrails on both sides of the stairs and use them. Fix handrails that are broken or loose. Make sure that handrails are as long as the stairways.  Check any carpeting to make sure that it is firmly attached to the stairs. Fix any carpet that is loose or worn.  Avoid having throw rugs at the top or bottom of the stairs. If you do have throw rugs, attach them to the floor with carpet tape.  Make sure that you have a light switch at the top of the stairs and the bottom of the stairs. If you do not have them, ask someone to add them for you. What else can I do to help prevent falls?  Wear shoes that:  Do not have high heels.  Have rubber bottoms.  Are  comfortable and fit you well.  Are closed at the toe. Do not wear sandals.  If you use a stepladder:  Make sure that it is fully opened. Do not climb a closed stepladder.  Make sure that both sides of the stepladder are locked into place.  Ask someone to hold it for you, if possible.  Clearly mark and make sure that you can see:  Any grab bars or handrails.  First and last steps.  Where the edge of each step is.  Use tools that help you move around (mobility aids) if they are needed. These include:  Canes.  Walkers.  Scooters.  Crutches.  Turn on the lights when you go into a dark area. Replace any light bulbs as soon as they burn out.  Set up your furniture so you have a clear path. Avoid moving your furniture around.  If any of your floors are uneven, fix them.  If there are any pets around you, be aware of where they are.  Review your medicines with your doctor. Some medicines can make you feel dizzy. This can increase your chance of falling. Ask your doctor what other things that you can do to help prevent falls. This information is not intended to replace advice given to you by your health care provider. Make sure you discuss any questions you have with your health care provider. Document Released: 05/22/2009 Document Revised: 01/01/2016 Document Reviewed: 08/30/2014 Elsevier Interactive Patient Education  2017 [employer].

## 2020-04-02 DIAGNOSIS — L570 Actinic keratosis: Secondary | ICD-10-CM | POA: Diagnosis not present

## 2020-04-02 DIAGNOSIS — L820 Inflamed seborrheic keratosis: Secondary | ICD-10-CM | POA: Diagnosis not present

## 2020-04-02 DIAGNOSIS — D229 Melanocytic nevi, unspecified: Secondary | ICD-10-CM | POA: Diagnosis not present

## 2020-04-02 DIAGNOSIS — L818 Other specified disorders of pigmentation: Secondary | ICD-10-CM | POA: Diagnosis not present

## 2020-04-03 ENCOUNTER — Encounter: Payer: Self-pay | Admitting: Primary Care

## 2020-04-03 ENCOUNTER — Other Ambulatory Visit: Payer: Self-pay

## 2020-04-03 ENCOUNTER — Ambulatory Visit (INDEPENDENT_AMBULATORY_CARE_PROVIDER_SITE_OTHER): Payer: Medicare Other | Admitting: Primary Care

## 2020-04-03 DIAGNOSIS — N289 Disorder of kidney and ureter, unspecified: Secondary | ICD-10-CM | POA: Diagnosis not present

## 2020-04-03 DIAGNOSIS — F329 Major depressive disorder, single episode, unspecified: Secondary | ICD-10-CM | POA: Diagnosis not present

## 2020-04-03 DIAGNOSIS — E119 Type 2 diabetes mellitus without complications: Secondary | ICD-10-CM

## 2020-04-03 DIAGNOSIS — F32A Depression, unspecified: Secondary | ICD-10-CM

## 2020-04-03 DIAGNOSIS — F419 Anxiety disorder, unspecified: Secondary | ICD-10-CM | POA: Diagnosis not present

## 2020-04-03 DIAGNOSIS — I82441 Acute embolism and thrombosis of right tibial vein: Secondary | ICD-10-CM

## 2020-04-03 DIAGNOSIS — E559 Vitamin D deficiency, unspecified: Secondary | ICD-10-CM

## 2020-04-03 DIAGNOSIS — M810 Age-related osteoporosis without current pathological fracture: Secondary | ICD-10-CM

## 2020-04-03 DIAGNOSIS — I10 Essential (primary) hypertension: Secondary | ICD-10-CM

## 2020-04-03 HISTORY — DX: Acute embolism and thrombosis of right tibial vein: I82.441

## 2020-04-03 NOTE — Assessment & Plan Note (Signed)
Intermittent, waxes and wanes, overall manges well on her own. Discussed to notify if it becomes unmanageable.

## 2020-04-03 NOTE — Assessment & Plan Note (Signed)
Compliant to Eliquis for which she began on 03/30/20. Continue for 3-6 months given that this was likely provoked. No prior history of DVT, no family history of clotting disorders.

## 2020-04-03 NOTE — Progress Notes (Signed)
Subjective:    Patient ID: Morgan Bowen, female    DOB: 01-04-1936, 84 y.o.   MRN: [id number]  HPI  This visit occurred during the SARS-CoV-2 public health emergency.  Safety protocols were in place, including screening questions prior to the visit, additional usage of staff PPE, and extensive cleaning of exam room while observing appropriate contact time as indicated for disinfecting solutions.   Morgan Bowen is a 84 year old female who presents today for [facility] Part 2.   Recently diagnosed with an age indeterminate isolated calf vein DVT within tibial veins. Eliquis was prescribed last week, she started taking four days ago. She endorses that she's been more sedentary since Covid-19, not going anywhere or doing much. She's never had a history of DVT/PE, no family history of DVT/PE/clotting disorders.   She has noticed that her HR fluctuated between 65 and 121 since starting Eliquis. This has occurred once. She's not sure if this is anxiety as she's been worried about her new DVT.   Immunizations: -Tetanus: Completed in 2016 -Influenza: Due this season  -Shingles: Completed -Pneumonia: UTD -Covid-19: Has not completed, contemplating.   Eye exam: UTD  Mammogram: Completed in 2020 Dexa: Completed in 2020, osteoporosis  Hep C Screen: Negative  BP Readings from Last 3 Encounters:  04/03/20 140/78  03/27/20 134/70  11/21/19 (!) 154/72     Review of Systems  Respiratory: Negative for shortness of breath.   Cardiovascular: Negative for chest pain.  Gastrointestinal: Negative for constipation and diarrhea.  Neurological: Negative for dizziness and headaches.       Past Medical History:  Diagnosis Date  . Acute cystitis 05/23/2018  . Anxiety   . Arthritis   . Cellulitis 12/13/2018  . Chickenpox   . Diabetes mellitus without complication ([place])   . GERD (gastroesophageal reflux disease)   . Hypertension   . Osteoporosis   . Type 2 diabetes mellitus ([place])   . Urinary tract  infection      Social History   Socioeconomic History  . Marital status: Widowed    Spouse name: Not on file  . Number of children: Not on file  . Years of education: Not on file  . Highest education level: Not on file  Occupational History  . Not on file  Tobacco Use  . Smoking status: Never Smoker  . Smokeless tobacco: Never Used  Vaping Use  . Vaping Use: Never used  Substance and Sexual Activity  . Alcohol use: No  . Drug use: No  . Sexual activity: Not Currently  Other Topics Concern  . Not on file  Social History Narrative   ** Merged History Encounter **       Widowed. Moved from [place]. 1 son, 3 grandchildren. Retired. Once worked as a Agricultural engineer.    Social Determinants of Health   Financial Resource Strain: Low Risk   . Difficulty of Paying Living Expenses: Not hard at all  Food Insecurity: No Food Insecurity  . Worried About Charity fundraiser in the Last Year: Never true  . Ran Out of Food in the Last Year: Never true  Transportation Needs: No Transportation Needs  . Lack of Transportation (Medical): No  . Lack of Transportation (Non-Medical): No  Physical Activity: Inactive  . Days of Exercise per Week: 0 days  . Minutes of Exercise per Session: 0 min  Stress: No Stress Concern Present  . Feeling of Stress : Not at all  Social Connections:   .  Frequency of Communication with Friends and Family: Not on file  . Frequency of Social Gatherings with Friends and Family: Not on file  . Attends Religious Services: Not on file  . Active Member of Clubs or Organizations: Not on file  . Attends Archivist Meetings: Not on file  . Marital Status: Not on file  Intimate Partner Violence: Not At Risk  . Fear of Current or Ex-Partner: No  . Emotionally Abused: No  . Physically Abused: No  . Sexually Abused: No    Past Surgical History:  Procedure Laterality Date  . ABDOMINAL HYSTERECTOMY  1984  . ABDOMINAL HYSTERECTOMY    . APPENDECTOMY   1958  . CHOLECYSTECTOMY    . GALLBLADDER SURGERY    . MANDIBLE FRACTURE SURGERY  07/13/2016  . TONSILLECTOMY  1958  . TONSILLECTOMY    . WRIST FRACTURE SURGERY Right    2000    Family History  Problem Relation Age of Onset  . Hypertension Mother   . Stroke Mother   . Stroke Father   . Hypertension Father   . Hypertension Sister   . Kidney disease Sister   . Breast cancer Neg Hx     Allergies  Allergen Reactions  . Sulfamethoxazole-Trimethoprim   . Penicillins Rash    Current Outpatient Medications on File Prior to Visit  Medication Sig Dispense Refill  . amLODipine (NORVASC) 10 MG tablet Take 1 tablet (10 mg total) by mouth daily. For blood pressure. 90 tablet 3  . apixaban (ELIQUIS) 5 MG TABS tablet Take 2 tablets by mouth twice daily for 10 days, then take 1 tablet twice daily thereafter. For blood clot. 80 tablet 0  . cloNIDine (CATAPRES) 0.1 MG tablet TAKE 1 TABLET BY MOUTH ONCE DAILY AS NEEDED FOR SYSTOLIC BLOOD PRESSURE ABOVE 170 30 tablet 1  . glimepiride (AMARYL) 2 MG tablet Take 1.5 tablets (3 mg total) by mouth daily with breakfast. 135 tablet 3  . glucose blood (ONE TOUCH ULTRA TEST) test strip USE AS DIRECTED TEST 3 TIMES DAILY E11.9 100 each 2  . irbesartan (AVAPRO) 300 MG tablet TAKE 1 TABLET BY MOUTH EVERY DAY 90 tablet 1  . metFORMIN (GLUCOPHAGE) 500 MG tablet TAKE 1 TABLET BY MOUTH TWICE DAILY WITH FOOD FOR DIABETES. 180 tablet 1  . metoprolol succinate (TOPROL-XL) 25 MG 24 hr tablet TAKE 1 TABLET BY MOUTH EVERY DAY FOR BLOOD PRESSURE 90 tablet 1  . mupirocin ointment (BACTROBAN) 2 % mupirocin 2 % topical ointment    . triamcinolone cream (KENALOG) 0.1 % Apply 1 application topically 2 (two) times daily. 30 g 0  . UNABLE TO FIND USE AS DIRECTED 3 TIMES A DAY    . Vitamin D, Ergocalciferol, (DRISDOL) 1.25 MG (50000 UNIT) CAPS capsule TAKE 1 CAPSULE BY MOUTH ONE TIME PER WEEK 12 capsule 0   No current facility-administered medications on file prior to visit.      BP 140/78   Pulse 78   Temp (!) 96.2 F (35.7 C) (Temporal)   Ht 5\' 5"  (1.651 m)   Wt 127 lb (57.6 kg)   SpO2 98%   BMI 21.13 kg/m    Objective:   Physical Exam Neck:     Vascular: No carotid bruit.  Cardiovascular:     Rate and Rhythm: Normal rate and regular rhythm.     Comments: Mild swelling noted to right calf, no erythema compared to left. Pulmonary:     Effort: Pulmonary effort is normal.  Breath sounds: Normal breath sounds.  Abdominal:     General: Abdomen is flat.     Palpations: Abdomen is soft.     Tenderness: There is no abdominal tenderness.  Musculoskeletal:     Cervical back: Neck supple.  Skin:    General: Skin is warm and dry.            Assessment & Plan:

## 2020-04-03 NOTE — Assessment & Plan Note (Signed)
Stable compared to prior labs. Continue to monitor.

## 2020-04-03 NOTE — Patient Instructions (Signed)
Continue Eliquis as prescribed.   Increase your activity level as discussed.  Continue calcium and vitamin D.  Continue to work on a healthy diet.   Please schedule a follow up appointment in 6 months for diabetes check.   It was a pleasure to see you today!

## 2020-04-03 NOTE — Assessment & Plan Note (Signed)
Compliant to calcium and vitamin D. Declines bisphosphonate and Prolia despite recommendations.  Encouraged weight bearing exercise. Repeat in December 2022.

## 2020-04-03 NOTE — Assessment & Plan Note (Addendum)
Improved with recent labs with A1C of 7.7.  Continue glimepiride 3 mg, metformin BID.  Eye and foot exam UTD. Pneumonia vaccination UTD. Managed on ARB. Declines statin therapy despite recommendations.   Follow up in 6 months.

## 2020-04-03 NOTE — Assessment & Plan Note (Signed)
Stable on current regimen. Continue to monitor.

## 2020-04-03 NOTE — Assessment & Plan Note (Signed)
Controlled in the office today, continue clonidine, irbesartan, metoprolol succinate. CMP reviewed.

## 2020-04-08 DIAGNOSIS — Z23 Encounter for immunization: Secondary | ICD-10-CM | POA: Diagnosis not present

## 2020-04-11 ENCOUNTER — Other Ambulatory Visit: Payer: Self-pay

## 2020-04-11 ENCOUNTER — Encounter: Payer: Self-pay | Admitting: Emergency Medicine

## 2020-04-11 DIAGNOSIS — Z79899 Other long term (current) drug therapy: Secondary | ICD-10-CM | POA: Insufficient documentation

## 2020-04-11 DIAGNOSIS — E119 Type 2 diabetes mellitus without complications: Secondary | ICD-10-CM | POA: Insufficient documentation

## 2020-04-11 DIAGNOSIS — Z7984 Long term (current) use of oral hypoglycemic drugs: Secondary | ICD-10-CM | POA: Diagnosis not present

## 2020-04-11 DIAGNOSIS — N390 Urinary tract infection, site not specified: Secondary | ICD-10-CM | POA: Diagnosis present

## 2020-04-11 DIAGNOSIS — Z7901 Long term (current) use of anticoagulants: Secondary | ICD-10-CM | POA: Insufficient documentation

## 2020-04-11 DIAGNOSIS — R300 Dysuria: Secondary | ICD-10-CM | POA: Insufficient documentation

## 2020-04-11 DIAGNOSIS — N3090 Cystitis, unspecified without hematuria: Secondary | ICD-10-CM | POA: Insufficient documentation

## 2020-04-11 DIAGNOSIS — I10 Essential (primary) hypertension: Secondary | ICD-10-CM | POA: Insufficient documentation

## 2020-04-11 NOTE — ED Triage Notes (Signed)
Pt report this evening developed urinary symptoms, frequency and burning sensation. Pt denies any other symptom, pt ambulatory to triage room with cane slow but steady gait.

## 2020-04-12 ENCOUNTER — Emergency Department
Admission: EM | Admit: 2020-04-12 | Discharge: 2020-04-12 | Disposition: A | Discharge status: Home / Self Care | Payer: Medicare Other | Attending: Emergency Medicine | Admitting: Emergency Medicine

## 2020-04-12 DIAGNOSIS — N3090 Cystitis, unspecified without hematuria: Secondary | ICD-10-CM | POA: Diagnosis not present

## 2020-04-12 LAB — URINALYSIS, COMPLETE (UACMP) WITH MICROSCOPIC
Bilirubin Urine: NEGATIVE
Glucose, UA: 50 mg/dL — AB
Ketones, ur: NEGATIVE mg/dL
Nitrite: NEGATIVE
Protein, ur: NEGATIVE mg/dL
Specific Gravity, Urine: 1.01 (ref 1.005–1.030)
WBC, UA: >50 WBC/hpf — ABNORMAL HIGH (ref 0–5)
pH: 5 (ref 5.0–8.0)

## 2020-04-12 LAB — CBC WITH DIFFERENTIAL/PLATELET
Abs Immature Granulocytes: 0.05 10*3/uL (ref 0.00–0.07)
Basophils Absolute: 0.1 10*3/uL (ref 0.0–0.1)
Basophils Relative: 1 %
Eosinophils Absolute: 0.3 10*3/uL (ref 0.0–0.5)
Eosinophils Relative: 3 %
HCT: 36.6 % (ref 36.0–46.0)
Hemoglobin: 12.5 g/dL (ref 12.0–15.0)
Immature Granulocytes: 1 %
Lymphocytes Relative: 32 %
Lymphs Abs: 2.9 10*3/uL (ref 0.7–4.0)
MCH: 31.3 pg (ref 26.0–34.0)
MCHC: 34.2 g/dL (ref 30.0–36.0)
MCV: 91.7 fL (ref 80.0–100.0)
Monocytes Absolute: 0.7 10*3/uL (ref 0.1–1.0)
Monocytes Relative: 7 %
Neutro Abs: 5 10*3/uL (ref 1.7–7.7)
Neutrophils Relative %: 56 %
Platelets: 255 10*3/uL (ref 150–400)
RBC: 3.99 MIL/uL (ref 3.87–5.11)
RDW: 13.2 % (ref 11.5–15.5)
WBC: 9 10*3/uL (ref 4.0–10.5)
nRBC: 0 % (ref 0.0–0.2)

## 2020-04-12 LAB — BASIC METABOLIC PANEL
Anion gap: 12 (ref 5–15)
BUN: 16 mg/dL (ref 8–23)
CO2: 22 mmol/L (ref 22–32)
Calcium: 9.3 mg/dL (ref 8.9–10.3)
Chloride: 102 mmol/L (ref 98–111)
Creatinine, Ser: 0.89 mg/dL (ref 0.44–1.00)
GFR calc Af Amer: >60 mL/min (ref >60)
GFR calc non Af Amer: >60 mL/min (ref >60)
Glucose, Bld: 207 mg/dL — ABNORMAL HIGH (ref 70–99)
Potassium: 4.4 mmol/L (ref 3.5–5.1)
Sodium: 136 mmol/L (ref 135–145)

## 2020-04-12 LAB — LACTIC ACID, PLASMA: Lactic Acid, Venous: 1.4 mmol/L (ref 0.5–1.9)

## 2020-04-12 MED ORDER — NITROFURANTOIN MONOHYD MACRO 100 MG PO CAPS: 100.0000 mg | ORAL_CAPSULE | Freq: Two times a day (BID) | ORAL | 0 refills | Status: AC

## 2020-04-12 NOTE — ED Provider Notes (Signed)
[facility] Emergency Department Provider Note  ____________________________________________   First MD Initiated Contact with Patient 04/12/20 [phone]     (approximate)  I have reviewed the triage vital signs and the nursing notes.   HISTORY  Chief Complaint Urinary Tract Infection   HPI Morgan Bowen is a 84 y.o. female with a past medical history of GERD, HTN, osteoporosis, DM, and prior urinary tract infections as well as right lower extremity DVT currently on Eliquis who presents for assessment of burning with urination associate with urinary frequency that began last night.  Patient states this is similar to a urinary tract infection she has had in the past.  She denies any fevers, chills, cough, chest pain, shortness of breath, nausea, vomiting, diarrhea, dysuria, blood in her urine, rash, acute extremity pain, recent falls or injuries, or other acute complaints.  Denies tobacco or EtOH use.  No other acute concerns at this time.  She states this feels like her typical urinary tract infection.         Past Medical History:  Diagnosis Date  . Acute cystitis 05/23/2018  . Anxiety   . Arthritis   . Cellulitis 12/13/2018  . Chickenpox   . Diabetes mellitus without complication ([place])   . GERD (gastroesophageal reflux disease)   . Hypertension   . Osteoporosis   . Type 2 diabetes mellitus ([place])   . Urinary tract infection     Patient Active Problem List   Diagnosis Date Noted  . Acute deep vein thrombosis (DVT) of right tibial vein ([place]) 04/03/2020  . Pain due to onychomycosis of toenails of both feet 01/22/2019  . Vitamin D deficiency 06/26/2018  . Decreased renal function 03/21/2018  . Non-traumatic compression fracture of lumbar vertebra with routine healing 08/26/2017  . Anxiety and depression 08/26/2017  . Type 2 diabetes mellitus ([place]) 04/27/2017  . Essential hypertension 04/27/2017  . Osteoporosis 04/27/2017    Past Surgical History:    Procedure Laterality Date  . ABDOMINAL HYSTERECTOMY  1984  . ABDOMINAL HYSTERECTOMY    . APPENDECTOMY  1958  . CHOLECYSTECTOMY    . GALLBLADDER SURGERY    . MANDIBLE FRACTURE SURGERY  07/13/2016  . TONSILLECTOMY  1958  . TONSILLECTOMY    . WRIST FRACTURE SURGERY Right    2000    Prior to Admission medications   Medication Sig Start Date End Date Taking? Authorizing Provider  amLODipine (NORVASC) 10 MG tablet Take 1 tablet (10 mg total) by mouth daily. For blood pressure. 06/11/19   [name], NP  apixaban (ELIQUIS) 5 MG TABS tablet Take 2 tablets by mouth twice daily for 10 days, then take 1 tablet twice daily thereafter. For blood clot. 03/27/20   [name], NP  cloNIDine (CATAPRES) 0.1 MG tablet TAKE 1 TABLET BY MOUTH ONCE DAILY AS NEEDED FOR SYSTOLIC BLOOD PRESSURE ABOVE 170 08/06/19   [name], NP  glimepiride (AMARYL) 2 MG tablet Take 1.5 tablets (3 mg total) by mouth daily with breakfast. 03/30/19   [name], NP  glucose blood (ONE TOUCH ULTRA TEST) test strip USE AS DIRECTED TEST 3 TIMES DAILY E11.9 01/18/19   [name], NP  irbesartan (AVAPRO) 300 MG tablet TAKE 1 TABLET BY MOUTH EVERY DAY 03/11/20   [name], NP  metFORMIN (GLUCOPHAGE) 500 MG tablet TAKE 1 TABLET BY MOUTH TWICE DAILY WITH FOOD FOR DIABETES. 01/16/20   [name], NP  metoprolol succinate (TOPROL-XL) 25 MG 24  hr tablet TAKE 1 TABLET BY MOUTH EVERY DAY FOR BLOOD PRESSURE 01/15/20   [name], NP  mupirocin ointment (BACTROBAN) 2 % mupirocin 2 % topical ointment    [provider]  nitrofurantoin, macrocrystal-monohydrate, (MACROBID) 100 MG capsule Take 1 capsule (100 mg total) by mouth 2 (two) times daily for 7 days. 04/12/20 04/19/20  Lucrezia Starch, MD  triamcinolone cream (KENALOG) 0.1 % Apply 1 application topically 2 (two) times daily. 11/21/19   Lesleigh Noe, MD  UNABLE TO FIND USE AS DIRECTED 3 TIMES A DAY 05/05/15   [provider]  Vitamin D, Ergocalciferol, (DRISDOL) 1.25 MG (50000 UNIT) CAPS capsule TAKE 1 CAPSULE BY MOUTH ONE TIME PER WEEK 03/05/20   [name], NP    Allergies Sulfamethoxazole-trimethoprim and Penicillins  Family History  Problem Relation Age of Onset  . Hypertension Mother   . Stroke Mother   . Stroke Father   . Hypertension Father   . Hypertension Sister   . Kidney disease Sister   . Breast cancer Neg Hx     Social History Social History   Tobacco Use  . Smoking status: Never Smoker  . Smokeless tobacco: Never Used  Vaping Use  . Vaping Use: Never used  Substance Use Topics  . Alcohol use: No  . Drug use: No    Review of Systems  Review of Systems  Constitutional: Negative for chills and fever.  HENT: Negative for sore throat.   Eyes: Negative for pain.  Respiratory: Negative for cough and stridor.   Cardiovascular: Negative for chest pain.  Gastrointestinal: Negative for vomiting.  Genitourinary: Positive for dysuria and frequency.  Skin: Negative for rash.  Neurological: Negative for seizures, loss of consciousness and headaches.  Psychiatric/Behavioral: Negative for suicidal ideas.  All other systems reviewed and are negative.     ____________________________________________   PHYSICAL EXAM:  VITAL SIGNS: ED Triage Vitals  Enc Vitals Group     BP 04/11/20 2329 (!) 153/69     Pulse Rate 04/11/20 2329 83     Resp 04/11/20 2329 20     Temp 04/11/20 2329 97.9 F (36.6 C)     Temp Source 04/11/20 2329 Oral     SpO2 04/11/20 2329 98 %     Weight 04/11/20 2331 125 lb (56.7 kg)     Height 04/11/20 2331 5\' 5"  (1.651 m)     Head Circumference --      Peak Flow --      Pain Score 04/11/20 2331 0     Pain Loc --      Pain Edu? --      Excl. in [place]? --    Vitals:   04/12/20 0428 04/12/20 0638  BP: (!) 164/59 (!) 134/59  Pulse: 87 72  Resp: 16 16  Temp: 98.6 F (37 C) 98.2 F (36.8 C)  SpO2: 97% 97%   Physical Exam Vitals and  nursing note reviewed.  Constitutional:      General: She is not in acute distress.    Appearance: She is well-developed.  HENT:     Head: Normocephalic and atraumatic.     Right Ear: External ear normal.     Left Ear: External ear normal.     Nose: Nose normal.     Mouth/Throat:     Mouth: Mucous membranes are moist.  Eyes:     Conjunctiva/sclera: Conjunctivae normal.  Cardiovascular:     Rate and Rhythm: Normal rate and regular  rhythm.     Heart sounds: No murmur heard.   Pulmonary:     Effort: Pulmonary effort is normal. No respiratory distress.     Breath sounds: Normal breath sounds.  Abdominal:     Palpations: Abdomen is soft.     Tenderness: There is no abdominal tenderness. There is no right CVA tenderness or left CVA tenderness.  Musculoskeletal:     Cervical back: Neck supple.  Skin:    General: Skin is warm and dry.     Capillary Refill: Capillary refill takes less than 2 seconds.  Neurological:     Mental Status: She is alert and oriented to person, place, and time.  Psychiatric:        Mood and Affect: Mood normal.      ____________________________________________   LABS (all labs ordered are listed, but only abnormal results are displayed)  Labs Reviewed  URINALYSIS, COMPLETE (UACMP) WITH MICROSCOPIC - Abnormal; Notable for the following components:      Result Value   Color, Urine YELLOW (*)    APPearance HAZY (*)    Glucose, UA 50 (*)    Hgb urine dipstick MODERATE (*)    Leukocytes,Ua MODERATE (*)    WBC, UA >50 (*)    Bacteria, UA RARE (*)    All other components within normal limits  BASIC METABOLIC PANEL - Abnormal; Notable for the following components:   Glucose, Bld 207 (*)    All other components within normal limits  URINE CULTURE  CBC WITH DIFFERENTIAL/PLATELET  LACTIC ACID, PLASMA   ____________________________________________  ____________________________________________   PROCEDURES  Procedure(s) performed (including  Critical Care):  Procedures   ____________________________________________   INITIAL IMPRESSION / ASSESSMENT AND PLAN / ED COURSE        Patient presents with above to history exam for assessment of burning with urination associated urinary frequency that began last night and feels very similar to prior UTIs.  Patient is afebrile hemodynamically stable on arrival.  Exam as above without any abdominal tenderness or CVA tenderness.  UA is consistent with cystitis.  Patient has a glucose of 200 but otherwise no significant other metabolic derangements.  WBC count is WNL.  Overall presentation is most consistent with uncomplicated cystitis.  Presentation, exam, vital signs, and work-up not consistent with pyelonephritis, diverticulitis, appendicitis, kidney stone, or other immediate life-threatening intra-abdominal pathology.  Urine culture ordered and Rx written for nitrofurantoin.  Patient strict follow-up with PCP in the next 3 to 5 days.  Discharged stable condition.  Strict return precautions advised and discussed.   ____________________________________________   FINAL CLINICAL IMPRESSION(S) / ED DIAGNOSES  Final diagnoses:  Cystitis    Medications - No data to display   ED Discharge Orders         Ordered    nitrofurantoin, macrocrystal-monohydrate, (MACROBID) 100 MG capsule  2 times daily        04/12/20 4097           Note:  This document was prepared using Dragon voice recognition software and may include unintentional dictation errors.   Lucrezia Starch, MD 04/12/20 [phone]

## 2020-04-12 NOTE — ED Notes (Signed)
Pt given water and crackers before discharge

## 2020-04-12 NOTE — ED Notes (Signed)
Pt states she drove herself and feels comfortable driving herself home. Pt ate provided snack and water. Pt walked to lobby, gait steady with cane. Pt alert and oriented x 4

## 2020-04-12 NOTE — ED Notes (Signed)
Pt states she had symptoms of UTI starting last evening. Pt alert and oriented, walks with cane, gait steady

## 2020-04-12 NOTE — ED Notes (Signed)
Lab called to add on urine culture.  ?

## 2020-04-13 ENCOUNTER — Emergency Department: Payer: Medicare Other

## 2020-04-13 ENCOUNTER — Other Ambulatory Visit: Payer: Self-pay

## 2020-04-13 DIAGNOSIS — R000 Tachycardia, unspecified: Secondary | ICD-10-CM | POA: Diagnosis not present

## 2020-04-13 DIAGNOSIS — R002 Palpitations: Secondary | ICD-10-CM | POA: Diagnosis not present

## 2020-04-13 DIAGNOSIS — E119 Type 2 diabetes mellitus without complications: Secondary | ICD-10-CM | POA: Insufficient documentation

## 2020-04-13 DIAGNOSIS — Z7901 Long term (current) use of anticoagulants: Secondary | ICD-10-CM | POA: Diagnosis not present

## 2020-04-13 DIAGNOSIS — Z79899 Other long term (current) drug therapy: Secondary | ICD-10-CM | POA: Insufficient documentation

## 2020-04-13 DIAGNOSIS — I10 Essential (primary) hypertension: Secondary | ICD-10-CM | POA: Diagnosis not present

## 2020-04-13 DIAGNOSIS — Z7984 Long term (current) use of oral hypoglycemic drugs: Secondary | ICD-10-CM | POA: Insufficient documentation

## 2020-04-13 LAB — BASIC METABOLIC PANEL
Anion gap: 11 (ref 5–15)
BUN: 16 mg/dL (ref 8–23)
CO2: 22 mmol/L (ref 22–32)
Calcium: 9.4 mg/dL (ref 8.9–10.3)
Chloride: 106 mmol/L (ref 98–111)
Creatinine, Ser: 0.91 mg/dL (ref 0.44–1.00)
GFR calc Af Amer: >60 mL/min (ref >60)
GFR calc non Af Amer: 59 mL/min — ABNORMAL LOW (ref >60)
Glucose, Bld: 268 mg/dL — ABNORMAL HIGH (ref 70–99)
Potassium: 4.1 mmol/L (ref 3.5–5.1)
Sodium: 139 mmol/L (ref 135–145)

## 2020-04-13 LAB — URINE CULTURE: Culture: 10000 — AB

## 2020-04-13 LAB — CBC
HCT: 34.9 % — ABNORMAL LOW (ref 36.0–46.0)
Hemoglobin: 12 g/dL (ref 12.0–15.0)
MCH: 31.4 pg (ref 26.0–34.0)
MCHC: 34.4 g/dL (ref 30.0–36.0)
MCV: 91.4 fL (ref 80.0–100.0)
Platelets: 255 10*3/uL (ref 150–400)
RBC: 3.82 MIL/uL — ABNORMAL LOW (ref 3.87–5.11)
RDW: 13.2 % (ref 11.5–15.5)
WBC: 8.8 10*3/uL (ref 4.0–10.5)
nRBC: 0 % (ref 0.0–0.2)

## 2020-04-13 LAB — TROPONIN I (HIGH SENSITIVITY): Troponin I (High Sensitivity): 6 ng/L (ref <18)

## 2020-04-13 MED ORDER — LACTATED RINGERS IV BOLUS
1000.0000 mL | Freq: Once | INTRAVENOUS | Status: AC
  Administered 2020-04-13: 1000 mL via INTRAVENOUS

## 2020-04-13 NOTE — ED Triage Notes (Addendum)
Patient c/o tachycarida. Patient placed on antibiotics by this ED for UTI on Friday. Patient called pharmacist and was told that tachycardia was a possible side effect of the medication and to d/c med. Patient further instructed to go to ED or urgent care.   Patient also on eliquis for DVT in right calf dx 2 weeks ago.   Patient had her COVID vaccine on Tuesday.

## 2020-04-14 ENCOUNTER — Emergency Department
Admission: EM | Admit: 2020-04-14 | Discharge: 2020-04-14 | Disposition: A | Discharge status: Home / Self Care | Payer: Medicare Other | Attending: Emergency Medicine | Admitting: Emergency Medicine

## 2020-04-14 DIAGNOSIS — R000 Tachycardia, unspecified: Secondary | ICD-10-CM | POA: Diagnosis not present

## 2020-04-14 DIAGNOSIS — R002 Palpitations: Secondary | ICD-10-CM

## 2020-04-14 LAB — TROPONIN I (HIGH SENSITIVITY): Troponin I (High Sensitivity): 10 ng/L (ref <18)

## 2020-04-14 LAB — MAGNESIUM: Magnesium: 1.6 mg/dL — ABNORMAL LOW (ref 1.7–2.4)

## 2020-04-14 MED ORDER — FOSFOMYCIN TROMETHAMINE 3 G PO PACK
3.0000 g | PACK | Freq: Once | ORAL | Status: AC
  Administered 2020-04-14: 3 g via ORAL
  Filled 2020-04-14: qty 3

## 2020-04-14 MED ORDER — MAGNESIUM SULFATE 2 GM/50ML IV SOLN
2.0000 g | Freq: Once | INTRAVENOUS | Status: AC
  Administered 2020-04-14: 2 g via INTRAVENOUS
  Filled 2020-04-14: qty 50

## 2020-04-14 NOTE — ED Notes (Signed)
Reviewed discharge instructions and follow-up care with patient. Patient verbalized understanding of all information reviewed. Patient stable, with no distress noted at this time.    

## 2020-04-14 NOTE — ED Provider Notes (Signed)
[facility] Emergency Department Provider Note  ____________________________________________   First MD Initiated Contact with Patient 04/14/20 [phone]     (approximate)  I have reviewed the triage vital signs and the nursing notes.   HISTORY  Chief Complaint Tachycardia    HPI Morgan Bowen is a 84 y.o. female with medical history as listed below who presents for evaluation of rapid heartbeat at home.  She says that she recently was started on Macrobid for urinary tract infection.  She has taken about 3 doses of it.  Tonight she was getting ready for bed and she was checking her oxygen level and her heart rate and found that her heart rate was in the 120s.  She said that she could feel the rapid heart rate but otherwise felt fine with no additional symptoms.  Specifically she denies fever/chills, sore throat, chest pain, shortness of breath, nausea, vomiting, abdominal pain, and dysuria.  Nothing particular made the rapid heart rate better or worse.  She called the pharmacist and was told that this could be a side effect of her medication and that she should stop taking it and go to the emergency department, so she did so.  Due to overwhelming ED and hospital patient volumes, she has been awaiting an extended period of time, and she has not developed any symptoms including the rapid heart rate for many hours.  Nothing in particular made the symptoms better or worse.  She has not suffered from arrhythmias or rapid heart rate in the past.  She has no history of heart problems and is checked regularly.  She has a history of recently diagnosed DVT in the right tibial vein for which she is currently taking Eliquis.         Past Medical History:  Diagnosis Date  . Acute cystitis 05/23/2018  . Anxiety   . Arthritis   . Cellulitis 12/13/2018  . Chickenpox   . Diabetes mellitus without complication ([place])   . GERD (gastroesophageal reflux disease)   . Hypertension   .  Osteoporosis   . Type 2 diabetes mellitus ([place])   . Urinary tract infection     Patient Active Problem List   Diagnosis Date Noted  . Acute deep vein thrombosis (DVT) of right tibial vein ([place]) 04/03/2020  . Pain due to onychomycosis of toenails of both feet 01/22/2019  . Vitamin D deficiency 06/26/2018  . Decreased renal function 03/21/2018  . Non-traumatic compression fracture of lumbar vertebra with routine healing 08/26/2017  . Anxiety and depression 08/26/2017  . Type 2 diabetes mellitus ([place]) 04/27/2017  . Essential hypertension 04/27/2017  . Osteoporosis 04/27/2017    Past Surgical History:  Procedure Laterality Date  . ABDOMINAL HYSTERECTOMY  1984  . ABDOMINAL HYSTERECTOMY    . APPENDECTOMY  1958  . CHOLECYSTECTOMY    . GALLBLADDER SURGERY    . MANDIBLE FRACTURE SURGERY  07/13/2016  . TONSILLECTOMY  1958  . TONSILLECTOMY    . WRIST FRACTURE SURGERY Right    2000    Prior to Admission medications   Medication Sig Start Date End Date Taking? Authorizing Provider  amLODipine (NORVASC) 10 MG tablet Take 1 tablet (10 mg total) by mouth daily. For blood pressure. 06/11/19   [name], NP  apixaban (ELIQUIS) 5 MG TABS tablet Take 2 tablets by mouth twice daily for 10 days, then take 1 tablet twice daily thereafter. For blood clot. 03/27/20   [name], NP  cloNIDine (CATAPRES) 0.1  MG tablet TAKE 1 TABLET BY MOUTH ONCE DAILY AS NEEDED FOR SYSTOLIC BLOOD PRESSURE ABOVE 170 08/06/19   [name], NP  glimepiride (AMARYL) 2 MG tablet Take 1.5 tablets (3 mg total) by mouth daily with breakfast. 03/30/19   [name], NP  glucose blood (ONE TOUCH ULTRA TEST) test strip USE AS DIRECTED TEST 3 TIMES DAILY E11.9 01/18/19   [name], NP  irbesartan (AVAPRO) 300 MG tablet TAKE 1 TABLET BY MOUTH EVERY DAY 03/11/20   [name], NP  metFORMIN (GLUCOPHAGE) 500 MG tablet TAKE 1 TABLET BY MOUTH TWICE DAILY WITH FOOD FOR DIABETES. 01/16/20   [name], NP  metoprolol succinate (TOPROL-XL) 25 MG 24 hr tablet TAKE 1 TABLET BY MOUTH EVERY DAY FOR BLOOD PRESSURE 01/15/20   [name], NP  mupirocin ointment (BACTROBAN) 2 % mupirocin 2 % topical ointment    [provider]  nitrofurantoin, macrocrystal-monohydrate, (MACROBID) 100 MG capsule Take 1 capsule (100 mg total) by mouth 2 (two) times daily for 7 days. 04/12/20 04/19/20  Lucrezia Starch, MD  triamcinolone cream (KENALOG) 0.1 % Apply 1 application topically 2 (two) times daily. 11/21/19   Lesleigh Noe, MD  UNABLE TO FIND USE AS DIRECTED 3 TIMES A DAY 05/05/15   [provider]  Vitamin D, Ergocalciferol, (DRISDOL) 1.25 MG (50000 UNIT) CAPS capsule TAKE 1 CAPSULE BY MOUTH ONE TIME PER WEEK 03/05/20   [name], NP    Allergies Sulfamethoxazole-trimethoprim and Penicillins  Family History  Problem Relation Age of Onset  . Hypertension Mother   . Stroke Mother   . Stroke Father   . Hypertension Father   . Hypertension Sister   . Kidney disease Sister   . Breast cancer Neg Hx     Social History Social History   Tobacco Use  . Smoking status: Never Smoker  . Smokeless tobacco: Never Used  Vaping Use  . Vaping Use: Never used  Substance Use Topics  . Alcohol use: No  . Drug use: No    Review of Systems Constitutional: No fever/chills Eyes: No visual changes. ENT: No sore throat. Cardiovascular: Rapid heart rate Respiratory: Denies shortness of breath. Gastrointestinal: No abdominal pain.  No nausea, no vomiting.  No diarrhea.  No constipation. Genitourinary: Negative for dysuria. Musculoskeletal: Negative for neck pain.  Negative for back pain. Integumentary: Negative for rash. Neurological: Negative for headaches, focal weakness or numbness.   ____________________________________________   PHYSICAL EXAM:  VITAL SIGNS: ED Triage Vitals  Enc Vitals Group     BP 04/13/20 2114 (!) 202/78     Pulse Rate 04/13/20 2114 (!)  125     Resp 04/13/20 2114 18     Temp 04/13/20 2114 98.3 F (36.8 C)     Temp Source 04/14/20 0141 Oral     SpO2 04/13/20 2114 97 %     Weight 04/13/20 2115 56.7 kg (125 lb)     Height 04/13/20 2115 1.651 m (5\' 5" )     Head Circumference --      Peak Flow --      Pain Score 04/13/20 2114 0     Pain Loc --      Pain Edu? --      Excl. in [place]? --     Constitutional: Alert and oriented.  Eyes: Conjunctivae are normal.  Head: Atraumatic. Nose: No congestion/rhinnorhea. Mouth/Throat: Patient is wearing a mask. Neck: No stridor.  No meningeal signs.  Cardiovascular: Normal rate, regular rhythm. Good peripheral circulation. Grossly normal heart sounds. Respiratory: Normal respiratory effort.  No retractions. Gastrointestinal: Soft and nontender. No distention.  Musculoskeletal: No lower extremity tenderness nor edema. No gross deformities of extremities. Neurologic:  Normal speech and language. No gross focal neurologic deficits are appreciated.  Skin:  Skin is warm, dry and intact. Psychiatric: Mood and affect are normal. Speech and behavior are normal.  ____________________________________________   LABS (all labs ordered are listed, but only abnormal results are displayed)  Labs Reviewed  BASIC METABOLIC PANEL - Abnormal; Notable for the following components:      Result Value   Glucose, Bld 268 (*)    GFR calc non Af Amer 59 (*)    All other components within normal limits  CBC - Abnormal; Notable for the following components:   RBC 3.82 (*)    HCT 34.9 (*)    All other components within normal limits  MAGNESIUM - Abnormal; Notable for the following components:   Magnesium 1.6 (*)    All other components within normal limits  TROPONIN I (HIGH SENSITIVITY)  TROPONIN I (HIGH SENSITIVITY)   ____________________________________________  EKG  ED ECG REPORT I, Hinda Kehr, the attending physician, personally viewed and interpreted this ECG.  Date: 04/13/2020 EKG  Time: 21:24 Rate: 124 Rhythm: sinus tachycardia QRS Axis: normal Intervals: normal ST/T Wave abnormalities: Non-specific ST segment / T-wave changes, but no clear evidence of acute ischemia. Narrative Interpretation: no definitive evidence of acute ischemia; does not meet STEMI criteria.   ____________________________________________  RADIOLOGY I, Hinda Kehr, personally viewed and evaluated these images (plain radiographs) as part of my medical decision making, as well as reviewing the written report by the radiologist.  ED MD interpretation:  No acute abnormalities  Official radiology report(s): DG Chest 2 View  Result Date: 04/13/2020 CLINICAL DATA:  Tachycardia EXAM: CHEST - 2 VIEW COMPARISON:  06/02/2019 FINDINGS: Lungs are well expanded, symmetric, and clear. No pneumothorax or pleural effusion. Cardiac size within normal limits. Pulmonary vascularity is normal. Osseous structures are age-appropriate. No acute bone abnormality. IMPRESSION: No active cardiopulmonary disease. Electronically Signed   By: Fidela Salisbury MD   On: 04/13/2020 22:18    ____________________________________________   PROCEDURES   Procedure(s) performed (including Critical Care):  Procedures   ____________________________________________   INITIAL IMPRESSION / MDM / [place] / ED COURSE  As part of my medical decision making, I reviewed the following data within the [facility] results, electronic medical record, EKG reviewed.   Differential diagnosis includes, but is not limited to, electrolyte or metabolic abnormality, sinus tachycardia not otherwise specified, dehydration, PE, acute infection, SVT, A. fib with RVR.  Other than "feeling my heartbeat", the patient was asymptomatic.  She has had normal vital signs since coming to the emergency department.  Her heart rate has typically been between 90 and 100 but she has no other symptoms including no chest pain,  shortness of breath, nor hypoxemia.  Her lab work was all within normal limits other than some mild hypomagnesemia which theoretically could lead to dysrhythmias.  I repleted the magnesium with magnesium 2 g IV.  Even in the extremely unlikely that that her very slightly elevated heart rate and are the episode she experienced at home are the results of a PE, she is already being treated with Eliquis and she has had no other symptoms.  No indication for further evaluation or treatment.  The patient seems more comfortable after being in  the emergency department for 9-1/2 hours and is appropriate for discharge and outpatient follow-up.  I gave my usual and customary return precautions.         ____________________________________________  FINAL CLINICAL IMPRESSION(S) / ED DIAGNOSES  Final diagnoses:  Palpitations     MEDICATIONS GIVEN DURING THIS VISIT:  Medications  magnesium sulfate IVPB 2 g 50 mL (2 g Intravenous New Bag/Given 04/14/20 0533)  fosfomycin (MONUROL) packet 3 g (has no administration in time range)  lactated ringers bolus 1,000 mL (0 mLs Intravenous Stopped 04/13/20 2300)     ED Discharge Orders    None      *Please note:  IKHLAS ALBO was evaluated in Emergency Department on 04/14/2020 for the symptoms described in the history of present illness. She was evaluated in the context of the global COVID-19 pandemic, which necessitated consideration that the patient might be at risk for infection with the SARS-CoV-2 virus that causes COVID-19. Institutional protocols and algorithms that pertain to the evaluation of patients at risk for COVID-19 are in a state of rapid change based on information released by regulatory bodies including the CDC and federal and state organizations. These policies and algorithms were followed during the patient's care in the ED.  Some ED evaluations and interventions may be delayed as a result of limited staffing during and after the pandemic.*  Note:   This document was prepared using Dragon voice recognition software and may include unintentional dictation errors.   Hinda Kehr, MD 04/14/20 [phone]

## 2020-04-14 NOTE — Discharge Instructions (Addendum)
Your workup in the Emergency Department today was reassuring.  We did not find any specific abnormalities.  We recommend you drink plenty of fluids, take your regular medications and/or any new ones prescribed today, and follow up with the doctor(s) listed in these documents as recommended.  Return to the Emergency Department if you develop new or worsening symptoms that concern you.  

## 2020-04-15 ENCOUNTER — Telehealth: Payer: Self-pay

## 2020-04-15 NOTE — Telephone Encounter (Signed)
Pt was seen 04/12/20 [facility] ED.

## 2020-04-15 NOTE — Telephone Encounter (Signed)
[place] Night - Client TELEPHONE ADVICE RECORD [facility] Patient Name: Morgan Bowen Gender: Female DOB: 05-May-1936 Age: 84 Y 11 M 30 D Return Phone Number: [phone] (Primary), [phone] (Secondary) Address: [address] City/State/Zip: [place] [address] Client [facility] Night - Client Client Site [place] Physician [name] - NP Contact Type Call Who Is Calling Patient / Member / Family / Caregiver Call Type Triage / Clinical Relationship To Patient Self Return Phone Number [phone] (Primary) Chief Complaint Urination Pain Reason for Call Symptomatic / Request for [place] has urination frequency and pain. Translation No Nurse Assessment Nurse: Hassell Done, RN, Mateo Flow Date/Time Eilene Ghazi Time): 04/11/2020 10:12:57 PM Confirm and document reason for call. If symptomatic, describe symptoms. ---Caller states that she is having urination pain and frequency that started tonight. She doesn't have a fever. She is having back pain but that is not abnormal for her. Has the patient had close contact with a person known or suspected to have the novel coronavirus illness OR traveled / lives in area with major community spread (including international travel) in the last 14 days from the onset of symptoms? * If Asymptomatic, screen for exposure and travel within the last 14 days. ---No Does the patient have any new or worsening symptoms? ---Yes Will a triage be completed? ---Yes Related visit to physician within the last 2 weeks? ---No Does the PT have any chronic conditions? (i.e. diabetes, asthma, this includes High risk factors for pregnancy, etc.) ---Yes List chronic conditions. ---HTN, Diabetes, blood clot in right leg Is this a behavioral health or substance abuse call? ---No Guidelines Guideline Title Affirmed Question Affirmed Notes  Nurse Date/Time Eilene Ghazi Time) Urination Pain - Female Diabetes mellitus or weak immune system (e.g., HIV positive, cancer chemo, splenectomy, organ transplant, chronic steroids) Hassell Done, RN, Mateo Flow 04/11/2020 10:14:35 PM Disp. Time Eilene Ghazi Time) Disposition Final User PLEASE NOTE: All timestamps contained within this report are represented as [place] Standard Time. CONFIDENTIALTY NOTICE: This fax transmission is intended only for the addressee. It contains information that is legally privileged, confidential or otherwise protected from use or disclosure. If you are not the intended recipient, you are strictly prohibited from reviewing, disclosing, copying using or disseminating any of this information or taking any action in reliance on or regarding this information. If you have received this fax in error, please notify us immediately by telephone so that we can arrange for its return to [place]. Phone: [phone], Toll-Free: [phone], Fax: [phone] Page: 2 of 2 Call Id: [id number] 04/11/2020 10:16:09 PM See HCP within 4 Hours (or PCP triage) Yes Hassell Done, RN, Monika Salk Disagree/Comply Comply Caller Understands Yes PreDisposition Did not know what to do Care Advice Given Per Guideline SEE HCP WITHIN 4 HOURS (OR PCP TRIAGE): * IF OFFICE WILL BE CLOSED AND NO PCP (PRIMARY CARE PROVIDER) SECOND-LEVEL TRIAGE: You need to be seen within the next 3 or 4 hours. A nearby Urgent Care Center [facility]) is often a good source of care. Another choice is to go to the ED. Go sooner if you become worse. CARE ADVICE given per Urination Pain - Female (Adult) guideline. CALL BACK IF: * You become worse. Referrals [facility] - ED

## 2020-04-15 NOTE — Telephone Encounter (Signed)
Noted, chart reviewed

## 2020-04-15 NOTE — Telephone Encounter (Signed)
[facility] Night - Client TELEPHONE ADVICE RECORD [facility] Patient Name: Morgan Bowen Gender: Female DOB: 01-03-1936 Age: 84 Y 2 M 1 D Return Phone Number: [phone] (Primary), [phone] (Secondary) Address: [address] City/State/Zip: [place] [address] Client [facility] Night - Client Client Site [place] Physician [name] - NP Contact Type Call Who Is Calling Patient / Member / Family / Caregiver Call Type Triage / Clinical Relationship To Patient Self Return Phone Number [phone] (Primary) Chief Complaint Heart palpitations or irregular heartbeat Reason for Call Symptomatic / Request for [place] states she was in the ER Friday for a bladder infection Nitrofurantoin 100mg  and her pulse is high as high as 122 Translation No Nurse Assessment Nurse: Caryn Section, RN, Butch Penny Date/Time (Eastern Time): 04/13/2020 8:05:30 PM Confirm and document reason for call. If symptomatic, describe symptoms. ---Caller states that she was seen in the ED on Friday night and DX with a UTI. She was prescribed Nitrofurantoin 100mg  and she has been taking it as prescribed. She reports that her heartrate was 122 BPM at approx 7:45 pm. No chest pain and pt denies palpitations. Current heart rate is 109 BPM. SpO% is 97%. Both readings via finger pulse oximeter. No SOB or dizziness. No fever, vomiting or diarrhea. Reports the UTI is improving. Normal PO fluid intake and urination. Has the patient had close contact with a person known or suspected to have the novel coronavirus illness OR traveled / lives in area with major community spread (including international travel) in the last 14 days from the onset of symptoms? * If Asymptomatic, screen for exposure and travel within the last 14 days. ---No Does the patient have any new or worsening symptoms?  ---Yes Will a triage be completed? ---Yes Related visit to physician within the last 2 weeks? ---Yes Does the PT have any chronic conditions? (i.e. diabetes, asthma, this includes High risk factors for pregnancy, etc.) ---Yes List chronic conditions. ---HTN, DM, anxiety, right leg DVT (pt is on Eliquis) Is this a behavioral health or substance abuse call? ---No PLEASE NOTE: All timestamps contained within this report are represented as [place] Standard Time. CONFIDENTIALTY NOTICE: This fax transmission is intended only for the addressee. It contains information that is legally privileged, confidential or otherwise protected from use or disclosure. If you are not the intended recipient, you are strictly prohibited from reviewing, disclosing, copying using or disseminating any of this information or taking any action in reliance on or regarding this information. If you have received this fax in error, please notify us immediately by telephone so that we can arrange for its return to [place]. Phone: [phone], Toll-Free: [phone], Fax: [phone] Page: 2 of 2 Call Id: [id number] Guidelines Guideline Title Affirmed Question Affirmed Notes Nurse Date/Time Eilene Ghazi Time) Heart Rate and Heartbeat Questions Age > 60 years (Exception: brief heartbeat symptoms that went away and now feels well) Caryn Section, RN, Butch Penny 04/13/2020 8:11:47 PM Disp. Time Eilene Ghazi Time) Disposition Final User 04/13/2020 8:15:12 PM See HCP within 4 Hours (or PCP triage) Yes Fisher, RN, [place]el [place] Disagree/Comply Comply Caller Understands Yes PreDisposition Call Doctor Care Advice Given Per Guideline SEE HCP WITHIN 4 HOURS (OR PCP TRIAGE): * IF OFFICE WILL BE CLOSED AND NO PCP (PRIMARY CARE PROVIDER) SECOND-LEVEL TRIAGE: You need to be seen within the next 3 or 4 hours. A nearby Urgent Care Center [facility]) is often a good source of care. Another choice  is to go to the ED. Go sooner if you become worse. BRING MEDICINES: *  Please bring a list of your current medicines when you go to see the doctor. * It is also a good idea to bring the pill bottles too. This will help the doctor to make certain you are taking the right medicines and the right dose. CALL BACK IF: * You become worse. CARE ADVICE given per Heart Rate and Heartbeat Questions (Adult) guideline. Referrals GO TO FACILITY UNDECIDED

## 2020-04-15 NOTE — Telephone Encounter (Signed)
per chart review tab pt seen [facility] ED on 04/14/20.

## 2020-04-15 NOTE — Telephone Encounter (Signed)
[facility] Night - Client TELEPHONE ADVICE RECORD [facility] Patient Name: Morgan Bowen Gender: Female DOB: 1936/02/02 Age: 84 Y 17 M 30 D Return Phone Number: [phone] (Primary), [phone] (Secondary) Address: [address] City/State/Zip: [place] [address] Client [name] Night - Client Client Site [place] Physician [name] - NP Contact Type Call Who Is Calling Patient / Member / Family / Caregiver Call Type Triage / Clinical Relationship To Patient Self Return Phone Number [phone] (Primary) Chief Complaint Urination Pain Reason for Call Symptomatic / Request for [place] has urination frequency and pain. Translation No Nurse Assessment Nurse: Hassell Done, RN, Mateo Flow Date/Time Eilene Ghazi Time): 04/11/2020 10:12:57 PM Confirm and document reason for call. If symptomatic, describe symptoms. ---Caller states that she is having urination pain and frequency that started tonight. She doesn't have a fever. She is having back pain but that is not abnormal for her. Has the patient had close contact with a person known or suspected to have the novel coronavirus illness OR traveled / lives in area with major community spread (including international travel) in the last 14 days from the onset of symptoms? * If Asymptomatic, screen for exposure and travel within the last 14 days. ---No Does the patient have any new or worsening symptoms? ---Yes Will a triage be completed? ---Yes Related visit to physician within the last 2 weeks? ---No Does the PT have any chronic conditions? (i.e. diabetes, asthma, this includes High risk factors for pregnancy, etc.) ---Yes List chronic conditions. ---HTN, Diabetes, blood clot in right leg Is this a behavioral health or substance abuse call? ---No Guidelines Guideline Title Affirmed Question Affirmed Notes  Nurse Date/Time Eilene Ghazi Time) Urination Pain - Female Diabetes mellitus or weak immune system (e.g., HIV positive, cancer chemo, splenectomy, organ transplant, chronic steroids) Hassell Done, RN, Mateo Flow 04/11/2020 10:14:35 PM Disp. Time Eilene Ghazi Time) Disposition Final User PLEASE NOTE: All timestamps contained within this report are represented as [place] Standard Time. CONFIDENTIALTY NOTICE: This fax transmission is intended only for the addressee. It contains information that is legally privileged, confidential or otherwise protected from use or disclosure. If you are not the intended recipient, you are strictly prohibited from reviewing, disclosing, copying using or disseminating any of this information or taking any action in reliance on or regarding this information. If you have received this fax in error, please notify us immediately by telephone so that we can arrange for its return to [place]. Phone: [phone], Toll-Free: [phone], Fax: [phone] Page: 2 of 2 Call Id: [id number] 04/11/2020 10:16:09 PM See HCP within 4 Hours (or PCP triage) Yes Hassell Done, RN, Monika Salk Disagree/Comply Comply Caller Understands Yes PreDisposition Did not know what to do Care Advice Given Per Guideline SEE HCP WITHIN 4 HOURS (OR PCP TRIAGE): * IF OFFICE WILL BE CLOSED AND NO PCP (PRIMARY CARE PROVIDER) SECOND-LEVEL TRIAGE: You need to be seen within the next 3 or 4 hours. A nearby Urgent Care Center [facility]) is often a good source of care. Another choice is to go to the ED. Go sooner if you become worse. CARE ADVICE given per Urination Pain - Female (Adult) guideline. CALL BACK IF: * You become worse. Referrals [facility] - ED

## 2020-04-19 ENCOUNTER — Other Ambulatory Visit: Payer: Self-pay | Admitting: Primary Care

## 2020-04-19 DIAGNOSIS — E119 Type 2 diabetes mellitus without complications: Secondary | ICD-10-CM

## 2020-04-21 ENCOUNTER — Other Ambulatory Visit: Payer: Self-pay

## 2020-04-21 ENCOUNTER — Encounter: Payer: Self-pay | Admitting: Primary Care

## 2020-04-21 ENCOUNTER — Ambulatory Visit (INDEPENDENT_AMBULATORY_CARE_PROVIDER_SITE_OTHER): Payer: Medicare Other | Admitting: Podiatry

## 2020-04-21 ENCOUNTER — Encounter: Payer: Self-pay | Admitting: Podiatry

## 2020-04-21 ENCOUNTER — Ambulatory Visit (INDEPENDENT_AMBULATORY_CARE_PROVIDER_SITE_OTHER): Payer: Medicare Other | Admitting: Primary Care

## 2020-04-21 DIAGNOSIS — M79674 Pain in right toe(s): Secondary | ICD-10-CM | POA: Diagnosis not present

## 2020-04-21 DIAGNOSIS — R002 Palpitations: Secondary | ICD-10-CM

## 2020-04-21 DIAGNOSIS — M79675 Pain in left toe(s): Secondary | ICD-10-CM | POA: Diagnosis not present

## 2020-04-21 DIAGNOSIS — E119 Type 2 diabetes mellitus without complications: Secondary | ICD-10-CM | POA: Diagnosis not present

## 2020-04-21 DIAGNOSIS — B351 Tinea unguium: Secondary | ICD-10-CM | POA: Diagnosis not present

## 2020-04-21 DIAGNOSIS — M129 Arthropathy, unspecified: Secondary | ICD-10-CM

## 2020-04-21 DIAGNOSIS — I82441 Acute embolism and thrombosis of right tibial vein: Secondary | ICD-10-CM

## 2020-04-21 DIAGNOSIS — Z7901 Long term (current) use of anticoagulants: Secondary | ICD-10-CM | POA: Insufficient documentation

## 2020-04-21 LAB — MAGNESIUM: Magnesium: 1.5 mg/dL (ref 1.5–2.5)

## 2020-04-21 LAB — TSH: TSH: 3.4 u[IU]/mL (ref 0.35–4.50)

## 2020-04-21 NOTE — Assessment & Plan Note (Addendum)
Recent emergency department visit for this, home monitor with tachycardia.  Exam today benign. Work up in the ED without cause for symptoms.   Will check TSH and repeat Magnesium level. She appears stable today, asymptomatic.  Consider cardiology referral if symptoms return.

## 2020-04-21 NOTE — Patient Instructions (Signed)
Stop by the lab prior to leaving today. I will notify you of your results once received.   It was a pleasure to see you today!  

## 2020-04-21 NOTE — Progress Notes (Signed)
Complaint:  Visit Type: This patient presents to the office for at risk foot care patient requires this care by professional since  this patient will be at risk  due to having diabetes.  Patient is unable to cut her own toenails since she cannot reach down.  She presents for at risk foot care today.  Patient says she has developed a thrombosis and is taking eliquis.  Podiatric Exam: Vascular: dorsalis pedis and posterior tibial pulses are palpable bilateral. Capillary return is immediate. Temperature gradient is WNL. Skin turgor WNL  Sensorium: Normal Semmes Weinstein monofilament test. Normal tactile sensation bilaterally. Nail Exam: Pt has thick disfigured discolored nails with subungual debris noted bilateral entire nail hallux.  Nails are thick disfigured and discolored hallux toenails. Ulcer Exam: There is no evidence of ulcer or pre-ulcerative changes or infection. Orthopedic Exam: Muscle tone and strength are WNL. No limitations in general ROM. No crepitus or effusions noted. Foot type and digits show no abnormalities. Bony prominences are unremarkable. Skin: No Porokeratosis. No infection or ulcers  Diagnosis:  Onychomycosis, , Pain in right toe, pain in left toes  Midfoot arthritis.  Treatment & Plan Procedures and Treatment: Consent by patient was obtained for treatment procedures.   Debridement of mycotic and hypertrophic toenails, 1 through 5 bilateral and clearing of subungual debris. No ulceration, no infection noted.  Told the patient to return for periodic foot evaluation to reduce potential  at risk. Complications Return Visit-Office Procedure: Patient instructed to return to the office for a follow up visit 3 months for continued evaluation and treatment.    Gardiner Barefoot DPM

## 2020-04-21 NOTE — Progress Notes (Signed)
Subjective:    Patient ID: Morgan Bowen, female    DOB: Aug 20, 1935, 84 y.o.   MRN: [id number]  HPI  This visit occurred during the SARS-CoV-2 public health emergency.  Safety protocols were in place, including screening questions prior to the visit, additional usage of staff PPE, and extensive cleaning of exam room while observing appropriate contact time as indicated for disinfecting solutions.   Morgan Bowen is a 84 year old female with a history of hypertension, DVT, osteoporosis, anxiety and depression, vitamin D deficiency, type 2 diabetes who presents today for emergency department follow up.  She presented to [facility] ED on 04/12/20 with a chief complaint of urinary frequency with dysuria. During her stay in the ED labs were grossly unremarkable except for glucose reading of 200. UA consistent for cystitis so she was treated with Macrobid course. Urine culture returned which was negative.  She presented again to [facility] ED on 04/14/20 with reports of palpitations with tachycardia, rate of 120. During her stay in the ED work up showed mild hypomagnesemia, was treated with IV magnesium. Suspicion was low for PE given recent initiation of Eliquis. She was treated with a one time dose of Fosfomycin. She felt better after work up, so she was discharged home later that day.   Since her discharge home she's feeling better. She denies dysuria, palpitations, dizziness, headaches.  She is compliant to Eliquis daily as prescribed. Home blood pressure readings have been stable.   BP Readings from Last 3 Encounters:  04/21/20 140/78  04/14/20 (!) 168/70  04/12/20 (!) 157/65     Review of Systems  Eyes: Negative for visual disturbance.  Respiratory: Negative for shortness of breath.   Cardiovascular: Negative for chest pain and palpitations.  Neurological: Negative for dizziness and headaches.       Past Medical History:  Diagnosis Date   Acute cystitis 05/23/2018   Anxiety    Arthritis     Cellulitis 12/13/2018   Chickenpox    Diabetes mellitus without complication (HCC)    GERD (gastroesophageal reflux disease)    Hypertension    Osteoporosis    Type 2 diabetes mellitus ([place])    Urinary tract infection      Social History   Socioeconomic History   Marital status: Widowed    Spouse name: Not on file   Number of children: Not on file   Years of education: Not on file   Highest education level: Not on file  Occupational History   Not on file  Tobacco Use   Smoking status: Never Smoker   Smokeless tobacco: Never Used  Vaping Use   Vaping Use: Never used  Substance and Sexual Activity   Alcohol use: No   Drug use: No   Sexual activity: Not Currently  Other Topics Concern   Not on file  Social History Narrative   ** Merged History Encounter **       Widowed. Moved from [place]. 1 son, 3 grandchildren. Retired. Once worked as a Agricultural engineer.    Social Determinants of Health   Financial Resource Strain: Low Risk    Difficulty of Paying Living Expenses: Not hard at all  Food Insecurity: No Food Insecurity   Worried About Charity fundraiser in the Last Year: Never true   Bone Gap in the Last Year: Never true  Transportation Needs: No Transportation Needs   Lack of Transportation (Medical): No   Lack of Transportation (Non-Medical): No  Physical Activity: Inactive  Days of Exercise per Week: 0 days   Minutes of Exercise per Session: 0 min  Stress: No Stress Concern Present   Feeling of Stress : Not at all  Social Connections:    Frequency of Communication with Friends and Family: Not on file   Frequency of Social Gatherings with Friends and Family: Not on file   Attends Religious Services: Not on Electrical engineer or Organizations: Not on file   Attends Archivist Meetings: Not on file   Marital Status: Not on file  Intimate Partner Violence: Not At Risk   Fear of Current or  Ex-Partner: No   Emotionally Abused: No   Physically Abused: No   Sexually Abused: No    Past Surgical History:  Procedure Laterality Date   ABDOMINAL HYSTERECTOMY  [place]  07/13/2016   TONSILLECTOMY  1958   TONSILLECTOMY     WRIST FRACTURE SURGERY Right    2000    Family History  Problem Relation Age of Onset   Hypertension Mother    Stroke Mother    Stroke Father    Hypertension Father    Hypertension Sister    Kidney disease Sister    Breast cancer Neg Hx     Allergies  Allergen Reactions   Sulfamethoxazole-Trimethoprim    Penicillins Rash    Current Outpatient Medications on File Prior to Visit  Medication Sig Dispense Refill   amLODipine (NORVASC) 10 MG tablet Take 1 tablet (10 mg total) by mouth daily. For blood pressure. 90 tablet 3   apixaban (ELIQUIS) 5 MG TABS tablet Take 2 tablets by mouth twice daily for 10 days, then take 1 tablet twice daily thereafter. For blood clot. 80 tablet 0   cloNIDine (CATAPRES) 0.1 MG tablet TAKE 1 TABLET BY MOUTH ONCE DAILY AS NEEDED FOR SYSTOLIC BLOOD PRESSURE ABOVE 170 30 tablet 1   diclofenac Sodium (VOLTAREN) 1 % GEL diclofenac 1 % topical gel     diltiazem (TIAZAC) 300 MG 24 hr capsule diltiazem CD 300 mg capsule,extended release 24 hr     glimepiride (AMARYL) 2 MG tablet TAKE 1.5 TABLETS (3 MG TOTAL) BY MOUTH DAILY WITH BREAKFAST. 135 tablet 0   glucose blood (ONE TOUCH ULTRA TEST) test strip USE AS DIRECTED TEST 3 TIMES DAILY E11.9 100 each 2   irbesartan (AVAPRO) 300 MG tablet TAKE 1 TABLET BY MOUTH EVERY DAY 90 tablet 1   metFORMIN (GLUCOPHAGE) 500 MG tablet TAKE 1 TABLET BY MOUTH TWICE DAILY WITH FOOD FOR DIABETES. 180 tablet 1   metoprolol succinate (TOPROL-XL) 25 MG 24 hr tablet TAKE 1 TABLET BY MOUTH EVERY DAY FOR BLOOD PRESSURE 90 tablet 1   mupirocin ointment  (BACTROBAN) 2 % mupirocin 2 % topical ointment     triamcinolone cream (KENALOG) 0.1 % Apply 1 application topically 2 (two) times daily. 30 g 0   UNABLE TO FIND USE AS DIRECTED 3 TIMES A DAY     Vitamin D, Ergocalciferol, (DRISDOL) 1.25 MG (50000 UNIT) CAPS capsule TAKE 1 CAPSULE BY MOUTH ONE TIME PER WEEK 12 capsule 0   losartan (COZAAR) 50 MG tablet losartan 50 mg tablet  TAKE 1 TABLET BY MOUTH EVERY DAY (Patient not taking: Reported on 04/21/2020)     No current facility-administered medications on file prior to visit.    BP  140/78    Pulse 78    Temp 97.6 F (36.4 C) (Temporal)    Ht 5\' 5"  (1.651 m)    Wt 129 lb (58.5 kg)    SpO2 95%    BMI 21.47 kg/m    Objective:   Physical Exam Cardiovascular:     Rate and Rhythm: Normal rate and regular rhythm.  Pulmonary:     Effort: Pulmonary effort is normal.     Breath sounds: Normal breath sounds.  Musculoskeletal:     Cervical back: Neck supple.  Skin:    General: Skin is warm and dry.            Assessment & Plan:

## 2020-04-26 ENCOUNTER — Other Ambulatory Visit: Payer: Self-pay | Admitting: Primary Care

## 2020-04-26 DIAGNOSIS — I82441 Acute embolism and thrombosis of right tibial vein: Secondary | ICD-10-CM

## 2020-04-28 NOTE — Chronic Care Management (AMB) (Deleted)
Chronic Care Management Pharmacy  Name: Morgan Bowen  MRN: [id number] DOB: 04-20-1936  Chief Complaint/ HPI  [name],  84 y.o., female presents for their Follow-Up CCM visit with the clinical pharmacist via telephone due to COVID-19 Pandemic.  PCP : [name], NP  Their chronic conditions include: HTN, type 2 diabetes, vitamin D deficiency, osteoporosis, anxiety, depression  Patient concerns: denies medication or health concerns  Office Visits:  04/21/20: Ok Edwards Carlis Abbott, Anda Kraft) - follow-up from ED visit for palpitations. Checking TSH and repeat Magnesium elvel.   04/03/20: Theodoro Clock, And[place]) - continue Eliquis as prescribed for 3-6 months. Increase activity level, calcium and vitamin D, healthy diet. Patient declined bisphosphonate and Prolia.   04/01/20: AWV with nursing - patient declined COVID vaccine. Educated on Shingrix vaccine.   03/27/20: Maretta Bees, [place]) - right calf pain. Ruling out DVT with doppler. Doppler positive for DVT in the calf. Begin Eliquis 5 mg - 2 tablets twice daily for 10 days then take 1 tablet bid therafter.   11/21/19 OV Einar Pheasant, [place]) - dermatitis. Advised to avoid new dog until symptoms improved. Started on triamcinolone 0.1% cream twice daily  10/02/19 OV Carlis Abbott, [place]) routine follow-up. A1c 8.6% from 7.9% (07/02/19). Patient denies statin therapy. No changes with other meds. F/u visit every 3 months.  Consult Visit:  04/21/20 OV (podiatrist) - nail debridement.   04/14/20 ED - palpitations. No medication changes upon discharge. Pulse 90-120 bpm.   94/21 ED - cystitis. Macrobid prescribed.   04/02/20 OV (Dermatology) - benign lesions. Cryotherapy performed.   01/16/17 OV Prudence Davidson, Podiatry) - Onychomycosis. Pain in the right and left toes.   Medications: Outpatient Encounter Medications as of 05/02/2020  Medication Sig  . amLODipine (NORVASC) 10 MG tablet Take 1 tablet (10 mg total) by mouth daily. For blood pressure.  . cloNIDine (CATAPRES) 0.1  MG tablet TAKE 1 TABLET BY MOUTH ONCE DAILY AS NEEDED FOR SYSTOLIC BLOOD PRESSURE ABOVE 170  . diclofenac Sodium (VOLTAREN) 1 % GEL diclofenac 1 % topical gel  . diltiazem (TIAZAC) 300 MG 24 hr capsule diltiazem CD 300 mg capsule,extended release 24 hr  . ELIQUIS 5 MG TABS tablet TAKE 2 TABLETS BY MOUTH TWICE DAILY FOR 10 DAYS, THEN TAKE 1 TABLET TWICE DAILY THEREAFTER. FOR BLOOD CLOT.  Marland Kitchen glimepiride (AMARYL) 2 MG tablet TAKE 1.5 TABLETS (3 MG TOTAL) BY MOUTH DAILY WITH BREAKFAST.  Marland Kitchen glucose blood (ONE TOUCH ULTRA TEST) test strip USE AS DIRECTED TEST 3 TIMES DAILY E11.9  . irbesartan (AVAPRO) 300 MG tablet TAKE 1 TABLET BY MOUTH EVERY DAY  . losartan (COZAAR) 50 MG tablet losartan 50 mg tablet  TAKE 1 TABLET BY MOUTH EVERY DAY (Patient not taking: Reported on 04/21/2020)  . metFORMIN (GLUCOPHAGE) 500 MG tablet TAKE 1 TABLET BY MOUTH TWICE DAILY WITH FOOD FOR DIABETES.  . metoprolol succinate (TOPROL-XL) 25 MG 24 hr tablet TAKE 1 TABLET BY MOUTH EVERY DAY FOR BLOOD PRESSURE  . mupirocin ointment (BACTROBAN) 2 % mupirocin 2 % topical ointment  . triamcinolone cream (KENALOG) 0.1 % Apply 1 application topically 2 (two) times daily.  Marland Kitchen UNABLE TO FIND USE AS DIRECTED 3 TIMES A DAY  . Vitamin D, Ergocalciferol, (DRISDOL) 1.25 MG (50000 UNIT) CAPS capsule TAKE 1 CAPSULE BY MOUTH ONE TIME PER WEEK   No facility-administered encounter medications on file as of 05/02/2020.   Current Diagnosis/Assessment:   Goals Addressed   None    Hypertension   CMP Latest Ref Rng &  Units 04/13/2020 04/12/2020 03/31/2020  Glucose 70 - 99 mg/dL 268(H) 207(H) 164(H)  BUN 8 - 23 mg/dL 16 16 20   Creatinine 0.44 - 1.00 mg/dL 0.91 0.89 0.94  Sodium 135 - 145 mmol/L 139 136 137  Potassium 3.5 - 5.1 mmol/L 4.1 4.4 4.5  Chloride 98 - 111 mmol/L 106 102 102  CO2 22 - 32 mmol/L 22 22 25   Calcium 8.9 - 10.3 mg/dL 9.4 9.3 9.4  Total Protein 6.0 - 8.3 g/dL - - 7.7  Total Bilirubin 0.2 - 1.2 mg/dL - - 0.6  Alkaline Phos 39 -  117 U/L - - 64  AST 0 - 37 U/L - - 17  ALT 0 - 35 U/L - - 16   Office blood pressures are  BP Readings from Last 3 Encounters:  04/21/20 140/78  04/14/20 (!) 168/70  04/12/20 (!) 157/65   BP goal < 140/90 mmHg Patient has failed these meds in the past: diltiazem, losartan, valsartan Patient is currently controlled on the following medications:  . Amlodipine 10 mg - 1 tablet once daily . Irbesartan 300 mg - 1 tablet once daily . Metoprolol succinate 25 mg - 1 tablet once daily  PRN:  Clonidine 0.1 mg - 1 tablet once daily as needed for systolic blood pressure above 170  Patient checks BP at home: 3-5 days/week with Omron arm monitor  Patient home BP readings are ranging: 130s/60s, occasionally SBP 150 but not often   We discussed: reports has not needed clonidine since starting amlodipine 05/2019  Plan: Continue current medications   Diabetes   Reports diagnosed in 1996; reports A1c was controlled until last 12 months. Used to go to health club every day. Since COVID has not been able to return.   Recent Relevant Labs: Lab Results  Component Value Date/Time   HGBA1C 7.7 (H) 03/31/2020 08:24 AM   HGBA1C 8.6 (H) 10/02/2019 08:34 AM   GFR 56.99 (L) 03/31/2020 08:24 AM   GFR 57.06 (L) 10/02/2019 08:34 AM    Kidney Function Lab Results  Component Value Date/Time   CREATININE 0.91 04/13/2020 09:37 PM   CREATININE 0.89 04/12/2020 05:09 AM   GFR 56.99 (L) 03/31/2020 08:24 AM   GFRNONAA 59 (L) 04/13/2020 09:37 PM   GFRAA >60 04/13/2020 09:37 PM   K 4.1 04/13/2020 09:37 PM   K 4.4 04/12/2020 05:09 AM   Checking BG: every other day  Usually checks after breakfast and late afternoon and reports blood glucose is erratic  A1c goal < 7% Patient has failed these meds in past: None Patient is currently uncontrolled on the following medications:  . Glimepiride 2 mg - 1 and 1/2 tablet once daily with breakfast . Metformin 500 mg - 1 tablet twice daily with meals   Last  diabetic eye exam:  Lab Results  Component Value Date/Time   HMDIABEYEEXA No Retinopathy 08/01/2019 12:00 AM    Last diabetic foot exam:  10/23/19  We discussed: Reports A1c was higher in February due to diet/eating out more with friends at living facility. Reports she is eating in now. She would like to adjust diet and exercise until August lab work, then reassess if medication changes are needed.  Plan: Continue current medications; Slowly increase exercise with goal of 30 minutes, 5 days per week. Eat a healthy diet high in vegetables, fruits and whole grains with low-fat dairy products, chicken, fish, legumes, non-tropical vegetable oils and nuts. Limit intake of sweets, sugar-sweetened beverages and red meats.   Vitamin D  Deficiency   Lab Results  Component Value Date   VD25OH 67.95 03/31/2020   Patient has failed these meds in past: none reported Patient is currently controlled on the following medications:   Vitamin D 50,000 units - 1 capsule once weekly  We discussed: confirmed adherence  Plan: Continue current medications   Dermatitis   Patient is currently controlled on the following medications:  . Triamcinolone 0.1% cream - apply to affected area twice daily  We discussed: takes PRN, works well   Plan: Continue current medications   Depression   Patient has failed these meds in past: none Patient is currently controlled on the following medications:   No pharmacotherapy  We discussed: Denies taking sertraline, reports she never started the medication. Denies interest in medications and denies anxiety or depressed symptoms.   Plan: Continue management without medication.  OTCs/Health Maintenance   Patient is currently on the following medications: . OneTouch Ultra test strips  . A-F Betafood - PRN indigestion . B-complex - 1 tablet every other day  . Vitamin C - 1 tablet daily . Vitamin E - 1 tablet daily  Plan: Continue current  medications  Medication Management   Pharm[facility]Benefits[facility]  Cost: reports BP medication and test strips are expensive Adherence: < 5 day gap between refills  We discussed: Prices may be lower at your insurance preferred pharmacies. Call the number on the back of your insurance card to verify.   Follow up:  3 month phone visit   [name], PharmD Clinical Pharmacist [facility] [phone]

## 2020-04-29 DIAGNOSIS — Z23 Encounter for immunization: Secondary | ICD-10-CM | POA: Diagnosis not present

## 2020-05-01 ENCOUNTER — Telehealth: Payer: Medicare Other

## 2020-05-02 ENCOUNTER — Telehealth: Payer: Medicare Other

## 2020-05-05 ENCOUNTER — Ambulatory Visit: Payer: Medicare Other

## 2020-05-05 ENCOUNTER — Other Ambulatory Visit: Payer: Self-pay

## 2020-05-05 DIAGNOSIS — I10 Essential (primary) hypertension: Secondary | ICD-10-CM

## 2020-05-05 DIAGNOSIS — E119 Type 2 diabetes mellitus without complications: Secondary | ICD-10-CM

## 2020-05-05 NOTE — Patient Instructions (Signed)
Visit Information  Goals Addressed            This Visit's Progress    Chronic Care Management       CARE PLAN ENTRY  Current Barriers:   Chronic Disease Management support, education, and care coordination needs related to Hypertension and Diabetes   Hypertension BP Readings from Last 3 Encounters:  04/21/20 140/78  04/14/20 (!) 168/70  04/12/20 (!) 157/65   Pharmacist Clinical Goal(s): o Over the next 12 weeks, patient will work with PharmD and providers to maintain BP goal <140/90 mmHg  Current regimen:   Amlodipine 10 mg - 1 tablet once daily  Irbesartan 300 mg - 1 tablet once daily  Metoprolol succinate 25 mg - 1 tablet once daily  Clonidine 0.1 mg  - 1 tablet daily for systolic blood pressure above 170  Interventions: o Reviewed home blood pressure readings.  o Discussed daily walking for exercise.   Patient self care activities - Over the next 12 weeks, patient will: o Continue to monitor blood pressure several days per week, document, and provide at future appointments o Ensure daily salt intake < 2300 mg/day  Diabetes Lab Results  Component Value Date/Time   HGBA1C 7.7 (H) 03/31/2020 08:24 AM   HGBA1C 8.6 (H) 10/02/2019 08:34 AM   Pharmacist Clinical Goal(s): o Over the next 12 weeks, patient will work with PharmD and providers to achieve A1c goal <7%  Current regimen:   Glimepiride 2 mg - 1 and 1/2 tablet once daily with breakfast  Metformin 500 mg - 1 tablet twice daily with food   Interventions: o Discuss dietary and exercise goals  o Reviewed home blood sugar readings.   Patient self care activities - Over the next 12 weeks, patient will: o Check blood sugar once daily, document, and provide at future appointment o Contact provider with any episodes of hypoglycemia o Slowly increase exercise with goal of 30 minutes, 5 days per week o Eat a healthy diet high in vegetables, fruits and whole grains with low-fat dairy products, chicken, fish,  legumes, non-tropical vegetable oils and nuts. Limit intake of sweets, sugar-sweetened beverages and red meats.  Medication Cost:  [place] prefer Higher education careers adviser.    You can also call [phone] to reach a local [place] counselor who will help you compare Medicare plans and answer any questions.  Discussed considering Eliquis Patient Assistance for 2022 when meets 3% of Out of [place] prescription cost.   Please see past updates related to this goal by clicking on the "Past Updates" button in the selected goal        The patient verbalized understanding of instructions provided today and declined a print copy of patient instruction materials.   Telephone follow up appointment with pharmacy team member scheduled for:07/2020  Sherre Poot, PharmD, [facility] Clinical Pharmacist [place] [phone] (office) [phone] (mobile)

## 2020-05-05 NOTE — Chronic Care Management (AMB) (Signed)
Chronic Care Management Pharmacy  Name: Morgan Bowen  MRN: [id number] DOB: [birth year]  Chief Complaint/ HPI  Morgan Bowen,  84 y.o., female presents for their Follow-Up CCM visit with the clinical pharmacist via telephone due to COVID-19 Pandemic.  PCP : Morgan Koch, NP  Their chronic conditions include: HTN, type 2 diabetes, vitamin D deficiency, osteoporosis, anxiety, depression  Patient concerns: denies medication or health concerns  Office Visits:  04/21/20: Morgan Bowen Morgan Bowen, Morgan Bowen) - follow-up from ED visit for palpitations. Checking TSH and repeat Magnesium elvel.   04/03/20: Morgan Bowen, Morgan Bowen) - continue Eliquis as prescribed for 3-6 months. Increase activity level, calcium and vitamin D, healthy diet. Patient declined bisphosphonate and Prolia.   04/01/20: AWV with nursing - patient declined COVID vaccine. Educated on Shingrix vaccine.   03/27/20: Morgan Bowen, Morgan Bowen) - right calf pain. Ruling out DVT with doppler. Doppler positive for DVT in the calf. Begin Eliquis 5 mg - 2 tablets twice daily for 10 days then take 1 tablet bid therafter.   11/21/19 OV Morgan Bowen, Morgan Bowen) - dermatitis. Advised to avoid new dog until symptoms improved. Started on triamcinolone 0.1% cream twice daily  10/02/19 OV Morgan Bowen, [place]) routine follow-up. A1c 8.6% from 7.9% (07/02/19). Patient denies statin therapy. No changes with other meds. F/u visit every 3 months.  Consult Visit:  04/21/20 OV (podiatrist) - nail debridement.   04/14/20 ED - palpitations. No medication changes upon discharge. Pulse 90-120 bpm.   94/21 ED - cystitis. Macrobid prescribed.   04/02/20 OV (Dermatology) - benign lesions. Cryotherapy performed.   01/16/17 OV Morgan Bowen, Podiatry) - Onychomycosis. Pain in the right and left toes.   Medications: Outpatient Encounter Medications as of 05/05/2020  Medication Sig  . amLODipine (NORVASC) 10 MG tablet Take 1 tablet (10 mg total) by mouth daily. For blood pressure.  . cloNIDine (CATAPRES) 0.1  MG tablet TAKE 1 TABLET BY MOUTH ONCE DAILY AS NEEDED FOR SYSTOLIC BLOOD PRESSURE ABOVE 170  . diclofenac Sodium (VOLTAREN) 1 % GEL diclofenac 1 % topical gel  . diltiazem (TIAZAC) 300 MG 24 hr capsule diltiazem CD 300 mg capsule,extended release 24 hr  . ELIQUIS 5 MG TABS tablet TAKE 2 TABLETS BY MOUTH TWICE DAILY FOR 10 DAYS, THEN TAKE 1 TABLET TWICE DAILY THEREAFTER. FOR BLOOD CLOT.  Marland Kitchen glimepiride (AMARYL) 2 MG tablet TAKE 1.5 TABLETS (3 MG TOTAL) BY MOUTH DAILY WITH BREAKFAST.  Marland Kitchen glucose blood (ONE TOUCH ULTRA TEST) test strip USE AS DIRECTED TEST 3 TIMES DAILY E11.9  . irbesartan (AVAPRO) 300 MG tablet TAKE 1 TABLET BY MOUTH EVERY DAY  . losartan (COZAAR) 50 MG tablet losartan 50 mg tablet  TAKE 1 TABLET BY MOUTH EVERY DAY (Patient not taking: Reported on 04/21/2020)  . metFORMIN (GLUCOPHAGE) 500 MG tablet TAKE 1 TABLET BY MOUTH TWICE DAILY WITH FOOD FOR DIABETES.  . metoprolol succinate (TOPROL-XL) 25 MG 24 hr tablet TAKE 1 TABLET BY MOUTH EVERY DAY FOR BLOOD PRESSURE  . mupirocin ointment (BACTROBAN) 2 % mupirocin 2 % topical ointment  . triamcinolone cream (KENALOG) 0.1 % Apply 1 application topically 2 (two) times daily.  Marland Kitchen UNABLE TO FIND USE AS DIRECTED 3 TIMES A DAY  . Vitamin D, Ergocalciferol, (DRISDOL) 1.25 MG (50000 UNIT) CAPS capsule TAKE 1 CAPSULE BY MOUTH ONE TIME PER WEEK   No facility-administered encounter medications on file as of 05/05/2020.   Current Diagnosis/Assessment:   Goals Addressed   None    DVT    Patient has failed these  meds in past: none reported Patient is currently controlled on the following medications:  . Eliquis 5 mg bid   We discussed:  Patient had a DVT in leg in August 2021. She is using Eliquis. Feels that her blood clot came from sedentary lifestyle during COVID. She has started walking 15 minutes daily. She reports Eliquis is expensive but covered by her insurance. We discussed the option of applying for patient assistance when meets 3% of  out-of-pocket expense for medications.   Plan  Continue current medications  Hypertension   CMP Latest Ref Rng & Units 04/13/2020 04/12/2020 03/31/2020  Glucose 70 - 99 mg/dL 268(H) 207(H) 164(H)  BUN 8 - 23 mg/dL 16 16 20   Creatinine 0.44 - 1.00 mg/dL 0.91 0.89 0.94  Sodium 135 - 145 mmol/L 139 136 137  Potassium 3.5 - 5.1 mmol/L 4.1 4.4 4.5  Chloride 98 - 111 mmol/L 106 102 102  CO2 22 - 32 mmol/L 22 22 25   Calcium 8.9 - 10.3 mg/dL 9.4 9.3 9.4  Total Protein 6.0 - 8.3 g/dL - - 7.7  Total Bilirubin 0.2 - 1.2 mg/dL - - 0.6  Alkaline Phos 39 - 117 U/L - - 64  AST 0 - 37 U/L - - 17  ALT 0 - 35 U/L - - 16   Office blood pressures are  BP Readings from Last 3 Encounters:  04/21/20 140/78  04/14/20 (!) 168/70  04/12/20 (!) 157/65   BP goal < 140/90 mmHg Patient has failed these meds in the past: diltiazem, losartan, valsartan Patient is currently controlled on the following medications:  . Amlodipine 10 mg - 1 tablet once daily . Irbesartan 300 mg - 1 tablet once daily . Metoprolol succinate 25 mg - 1 tablet once daily  PRN:  Clonidine 0.1 mg - 1 tablet once daily as needed for systolic blood pressure above 170  Patient checks BP at home: 3-5 days/week with Omron arm monitor  Patient home BP readings are ranging: 130s/60s, occasionally SBP 150 but not often   We discussed: reports has not needed clonidine since starting amlodipine 05/2019  Update 05/05/2020 - Recent blood pressure readings have been wonderful since medication changes. BP has been 126-133/60-65 mmHg. Has not had to use clonidine since adding amlodipine. Patient reports that one of her blood pressure medications is $60. She is going to ask Morgan Bowen to call in a future refill to [facility] to determine if cheaper since preferred pharmacy. Encouraged patient to contact [place] counselor to evaluate medicare plan for 2022.   Plan: Continue current medications   Diabetes   Reports diagnosed in 1996; reports A1c was  controlled until last 12 months. Used to go to health club every day. Since COVID has not been able to return.   Recent Relevant Labs: Lab Results  Component Value Date/Time   HGBA1C 7.7 (H) 03/31/2020 08:24 AM   HGBA1C 8.6 (H) 10/02/2019 08:34 AM   GFR 56.99 (L) 03/31/2020 08:24 AM   GFR 57.06 (L) 10/02/2019 08:34 AM    Kidney Function Lab Results  Component Value Date/Time   CREATININE 0.91 04/13/2020 09:37 PM   CREATININE 0.89 04/12/2020 05:09 AM   GFR 56.99 (L) 03/31/2020 08:24 AM   GFRNONAA 59 (L) 04/13/2020 09:37 PM   GFRAA >60 04/13/2020 09:37 PM   K 4.1 04/13/2020 09:37 PM   K 4.4 04/12/2020 05:09 AM   Checking BG: every other day  Usually checks after breakfast and late afternoon and reports blood glucose is erratic  A1c goal < 7% Patient has failed these meds in past: None Patient is currently uncontrolled on the following medications:  . Glimepiride 2 mg - 1 and 1/2 tablet once daily with breakfast . Metformin 500 mg - 1 tablet twice daily with meals   Last diabetic eye exam:  Lab Results  Component Value Date/Time   HMDIABEYEEXA No Retinopathy 08/01/2019 12:00 AM    Last diabetic foot exam:  10/23/19  We discussed: Reports A1c was higher in February due to diet/eating out more with friends at living facility. Reports she is eating in now. She would like to adjust diet and exercise until August lab work, then reassess if medication changes are needed.  Update 05/05/2020 - Maintaining blood sugar with diet. Patient reports that she was drinking regular soda most days during the quarantine out of habit. She is working to improve diet. Patient has tried to resume activity since August's blood clot. She walks 15 minutes daily right now indoor and outdoor. Recent blood sugar readings have been erratic. Patient likes to see 135 mg/dL and sees that reading most mornings. Denies any low blood sugar readings.    Plan: Continue current medications;   Vitamin D Deficiency    Lab Results  Component Value Date   VD25OH 67.95 03/31/2020   Patient has failed these meds in past: none reported Patient is currently controlled on the following medications:   Vitamin D 50,000 units - 1 capsule once weekly  We discussed: confirmed adherence  Plan: Continue current medications   Osteopenia / Osteoporosis   Last DEXA Scan: 07/23/2019  T-Score femoral neck: -2.7  T-Score forearm radius: -3.3   VITD  Date Value Ref Range Status  03/31/2020 67.95 30.00 - 100.00 ng/mL Final     Patient is a candidate for pharmacologic treatment due to history of hip fracture  and history of vertebral fracture  Patient has failed these meds in past: none reported Patient is currently uncontrolled on the following medications:  . Calcium/magnesium supplement daily   We discussed:  Recommend [phone] units of vitamin D daily. Recommend 1200 mg of calcium daily from dietary and supplemental sources. Recommend weight-bearing and muscle strengthening exercises for building and maintaining bone density.   Diet: patient eats dark leafy green vegetables daily. Eats cheese, yogurt and cottage cheese.   Patient has been concerened to start Prolia or bisphosphonate due to commercials. Encouraged patient to reconsider due to risk of future fracture or broken bone. Patient will think about it and let Morgan Bowen or pharmacist know.   Plan  Continue control with diet and exercise. Consider bisphosphonate due to osteoporosis and history of vertebral fracture.    Depression   Patient has failed these meds in past: none Patient is currently controlled on the following medications:   No pharmacotherapy  We discussed: Denies taking sertraline, reports she never started the medication. Denies interest in medications and denies anxiety or depressed symptoms.   Plan: Continue management without medication.  OTCs/Health Maintenance   Patient is currently on the following  medications: . OneTouch Ultra test strips  . A-F Betafood - PRN indigestion . B-complex - 1 tablet every other day  . Vitamin C - 1 tablet daily . Vitamin E - 1 tablet daily  Plan: Continue current medications   Vaccines   Reviewed and discussed patient's vaccination history.    Immunization History  Administered Date(s) Administered  . Influenza, High Dose Seasonal PF 05/09/2012  . Influenza,inj,Quad PF,6+ Mos 03/30/2019  . PFIZER SARS-COV-2  Vaccination 04/08/2020, 04/29/2020  . Pneumococcal Conjugate-13 10/24/2017  . Pneumococcal Polysaccharide-23 08/23/2013  . Tdap 07/14/2015  . Zoster 08/06/2013, 12/08/2014    Plan  Recommended patient receive annual flu vaccine in office.    Medication Management   Ph[facility]Benefits[facility] [place]: reports BP medication and test strips are expensive Adherence: < 5 day gap between refills  We discussed: Prices may be lower at your insurance preferred pharmacies. Call the number on the back of your insurance card to verify.   Update: Patient has determined that [facility] or [facility] are her cheapest option for medications. She plans to have prescriptions sent to [facility] from [place] next time. Encouraged patient to contact [place] counselor to determine best plan for 2022.   Follow up: 3 month phone visit to revisit bisphosphonate treatmetn and review blood sugar/blood pressure readings.   Sherre Poot, PharmD, [facility] Clinical Pharmacist [facility] [phone] (office) [phone] (mobile)

## 2020-05-16 ENCOUNTER — Other Ambulatory Visit: Payer: Self-pay

## 2020-05-16 ENCOUNTER — Encounter: Payer: Self-pay | Admitting: Emergency Medicine

## 2020-05-16 ENCOUNTER — Emergency Department: Payer: Medicare Other

## 2020-05-16 ENCOUNTER — Emergency Department
Admission: EM | Admit: 2020-05-16 | Discharge: 2020-05-16 | Disposition: A | Discharge status: Home / Self Care | Payer: Medicare Other | Attending: Emergency Medicine | Admitting: Emergency Medicine

## 2020-05-16 ENCOUNTER — Telehealth: Payer: Self-pay

## 2020-05-16 DIAGNOSIS — I10 Essential (primary) hypertension: Secondary | ICD-10-CM | POA: Insufficient documentation

## 2020-05-16 DIAGNOSIS — Z7984 Long term (current) use of oral hypoglycemic drugs: Secondary | ICD-10-CM | POA: Diagnosis not present

## 2020-05-16 DIAGNOSIS — E119 Type 2 diabetes mellitus without complications: Secondary | ICD-10-CM | POA: Insufficient documentation

## 2020-05-16 DIAGNOSIS — Z7901 Long term (current) use of anticoagulants: Secondary | ICD-10-CM | POA: Diagnosis not present

## 2020-05-16 DIAGNOSIS — Z79899 Other long term (current) drug therapy: Secondary | ICD-10-CM | POA: Insufficient documentation

## 2020-05-16 DIAGNOSIS — M79605 Pain in left leg: Secondary | ICD-10-CM | POA: Diagnosis not present

## 2020-05-16 DIAGNOSIS — M79662 Pain in left lower leg: Secondary | ICD-10-CM | POA: Diagnosis not present

## 2020-05-16 NOTE — ED Provider Notes (Signed)
[facility] Emergency Department Provider Note   ____________________________________________    I have reviewed the triage vital signs and the nursing notes.   HISTORY  Chief Complaint Leg Pain     HPI Morgan Bowen is a 84 y.o. female with history of diabetes, DVT in the right leg diagnosed approximately 1 month ago on Eliquis presents with complaints of cramping and mild swelling in her left leg/calf.  No shortness of breath or pleurisy.  No chest pain.  Has been compliant with Eliquis.  No injury to the area.  No fevers.  No rash.  Past Medical History:  Diagnosis Date  . Acute cystitis 05/23/2018  . Anxiety   . Arthritis   . Cellulitis 12/13/2018  . Chickenpox   . Diabetes mellitus without complication ([place])   . GERD (gastroesophageal reflux disease)   . Hypertension   . Osteoporosis   . Type 2 diabetes mellitus ([place])   . Urinary tract infection     Patient Active Problem List   Diagnosis Date Noted  . On apixaban therapy 04/21/2020  . Palpitations 04/21/2020  . Acute deep vein thrombosis (DVT) of right tibial vein ([place]) 04/03/2020  . Pain due to onychomycosis of toenails of both feet 01/22/2019  . Vitamin D deficiency 06/26/2018  . Decreased renal function 03/21/2018  . Non-traumatic compression fracture of lumbar vertebra with routine healing 08/26/2017  . Anxiety and depression 08/26/2017  . Type 2 diabetes mellitus ([place]) 04/27/2017  . Essential hypertension 04/27/2017  . Osteoporosis 04/27/2017    Past Surgical History:  Procedure Laterality Date  . ABDOMINAL HYSTERECTOMY  1984  . ABDOMINAL HYSTERECTOMY    . APPENDECTOMY  1958  . CHOLECYSTECTOMY    . GALLBLADDER SURGERY    . MANDIBLE FRACTURE SURGERY  07/13/2016  . TONSILLECTOMY  1958  . TONSILLECTOMY    . WRIST FRACTURE SURGERY Right    2000    Prior to Admission medications   Medication Sig Start Date End Date Taking? Authorizing Provider  amLODipine (NORVASC) 10  MG tablet Take 1 tablet (10 mg total) by mouth daily. For blood pressure. 06/11/19   [name], NP  Calcium-Magnesium-Vitamin D [phone] MG-MG-UNIT TABS Take 1 tablet by mouth daily.    [provider]  cloNIDine (CATAPRES) 0.1 MG tablet TAKE 1 TABLET BY MOUTH ONCE DAILY AS NEEDED FOR SYSTOLIC BLOOD PRESSURE ABOVE 170 Patient not taking: Reported on 05/05/2020 08/06/19   [name], NP  diclofenac Sodium (VOLTAREN) 1 % GEL diclofenac 1 % topical gel 03/22/18   [provider]  diltiazem (TIAZAC) 300 MG 24 hr capsule diltiazem CD 300 mg capsule,extended release 24 hr    [provider]  ELIQUIS 5 MG TABS tablet TAKE 2 TABLETS BY MOUTH TWICE DAILY FOR 10 DAYS, THEN TAKE 1 TABLET TWICE DAILY THEREAFTER. FOR BLOOD CLOT. 04/28/20   [name], NP  glimepiride (AMARYL) 2 MG tablet TAKE 1.5 TABLETS (3 MG TOTAL) BY MOUTH DAILY WITH BREAKFAST. 04/19/20   [name], NP  glucose blood (ONE TOUCH ULTRA TEST) test strip USE AS DIRECTED TEST 3 TIMES DAILY E11.9 01/18/19   [name], NP  irbesartan (AVAPRO) 300 MG tablet TAKE 1 TABLET BY MOUTH EVERY DAY 03/11/20   [name], NP  losartan (COZAAR) 50 MG tablet losartan 50 mg tablet  TAKE 1 TABLET BY MOUTH EVERY DAY Patient not taking: Reported on 04/21/2020    [provider]  metFORMIN (GLUCOPHAGE) 500  MG tablet TAKE 1 TABLET BY MOUTH TWICE DAILY WITH FOOD FOR DIABETES. 01/16/20   [name], NP  metoprolol succinate (TOPROL-XL) 25 MG 24 hr tablet TAKE 1 TABLET BY MOUTH EVERY DAY FOR BLOOD PRESSURE 01/15/20   [name], NP  mupirocin ointment (BACTROBAN) 2 % mupirocin 2 % topical ointment    [provider]  triamcinolone cream (KENALOG) 0.1 % Apply 1 application topically 2 (two) times daily. 11/21/19   Lesleigh Noe, MD  UNABLE TO FIND USE AS DIRECTED 3 TIMES A DAY 05/05/15   [provider]  Vitamin D, Ergocalciferol, (DRISDOL) 1.25 MG (50000 UNIT)  CAPS capsule TAKE 1 CAPSULE BY MOUTH ONE TIME PER WEEK 03/05/20   [name], NP     Allergies Sulfamethoxazole-trimethoprim and Penicillins  Family History  Problem Relation Age of Onset  . Hypertension Mother   . Stroke Mother   . Stroke Father   . Hypertension Father   . Hypertension Sister   . Kidney disease Sister   . Breast cancer Neg Hx     Social History Social History   Tobacco Use  . Smoking status: Never Smoker  . Smokeless tobacco: Never Used  Vaping Use  . Vaping Use: Never used  Substance Use Topics  . Alcohol use: No  . Drug use: No    Review of Systems  Constitutional: No fever/chills Eyes: No visual changes.  ENT: No sore throat. Cardiovascular: As above Respiratory: As above Gastrointestinal: No abdominal pain.   Genitourinary: Negative for dysuria. Musculoskeletal: As above Skin: Negative for rash. Neurological: Negative for numbness or tingling   ____________________________________________   PHYSICAL EXAM:  VITAL SIGNS: ED Triage Vitals  Enc Vitals Group     BP 05/16/20 1142 (!) 172/63     Pulse Rate 05/16/20 1142 93     Resp 05/16/20 1142 16     Temp 05/16/20 1142 97.7 F (36.5 C)     Temp Source 05/16/20 1142 Oral     SpO2 05/16/20 1142 100 %     Weight 05/16/20 1147 58.1 kg (128 lb)     Height 05/16/20 1147 1.651 m (5\' 5" )     Head Circumference --      Peak Flow --      Pain Score 05/16/20 1147 4     Pain Loc --      Pain Edu? --      Excl. in [place]? --     Constitutional: Alert and oriented.  Nose: No congestion/rhinnorhea. Mouth/Throat: Mucous membranes are moist.    Cardiovascular: Normal rate, regular rhythm.   Good peripheral circulation. Respiratory: Normal respiratory effort.  No retractions.   Musculoskeletal: Minimal swelling to the left lower leg: Mild tenderness palpation of the calf, no redness or rash, 2+ distal pulses Neurologic:  Normal speech and language. No gross focal neurologic deficits are  appreciated.  Skin:  Skin is warm, dry and intact.  Psychiatric: Mood and affect are normal. Speech and behavior are normal.  ____________________________________________   LABS (all labs ordered are listed, but only abnormal results are displayed)  Labs Reviewed - No data to display ____________________________________________  EKG  None ____________________________________________  RADIOLOGY  Ultrasound reviewed by me, no evidence of DVT Confirmed by radiology ____________________________________________   PROCEDURES  Procedure(s) performed: No  Procedures   Critical Care performed: No ____________________________________________   INITIAL IMPRESSION / ASSESSMENT AND PLAN / ED COURSE  Pertinent labs & imaging results that were available during my care  of the patient were reviewed by me and considered in my medical decision making (see chart for details).  Patient presents with left calf pain as detailed above.  Concerning for possible DVT however she has been compliant with Eliquis.  Differential also includes musculoskeletal injury, extremity is warm and well perfused, no varicose veins.  Ultrasound is negative for DVT, will have the patient follow-up with PCP/orthopedics as needed,    ____________________________________________   FINAL CLINICAL IMPRESSION(S) / ED DIAGNOSES  Final diagnoses:  Left leg pain        Note:  This document was prepared using Dragon voice recognition software and may include unintentional dictation errors.   Lavonia Drafts, MD 05/16/20 1335

## 2020-05-16 NOTE — ED Triage Notes (Signed)
Pt to ED via POV from Urgent Care for left leg pain since last night. Pt states that she was dx with DVT last month in her right leg. Pt states that she is not having any swelling but is having pain in the lower leg and calf. Pt is in NAD. Pt states that she is on blood thinners.

## 2020-05-16 NOTE — Telephone Encounter (Signed)
I spoke with pt; pt having extreme pain in lower lt leg pain level now is 5; no swelling,no redness. Pt said had blood clot in rt leg and just had pain then. Pt said that [facility] does not do doppler studies and was not seeing pt. No CP or SOB. pts neighbor is driving pt to [facility] where pt said she had a doppler done there a month ago for the blood clot in rt leg; if [facility] ED. Sending note to Gentry Fitz NP as PCP and to DR [facility] as being in office.

## 2020-05-16 NOTE — Telephone Encounter (Signed)
[facility] - Client TELEPHONE ADVICE RECORD [facility] Patient Name: Morgan Bowen Gender: Female DOB: May 08, 1936 Age: 84 Y 3 M 3 D Return Phone Number: [phone] (Primary), [phone] (Secondary) Address: City/State/Zip: [place] [address] Client [employer] [facility] - Client Client Site [facility] - Day Physician [name] - NP Contact Type Call Who Is Calling Patient / Member / Family / Caregiver Call Type Triage / Clinical Relationship To Patient Self Return Phone Number [phone] (Primary) Chief Complaint Leg Pain Reason for Call Symptomatic / Request for [name] is stating that she may have a blood clot in left leg causing her extreme pain. She has a history of blood clots in the right leg. [place] Translation No Nurse Assessment Nurse: Radford Pax, RN, Eugene Garnet Date/Time (Eastern Time): 05/16/2020 9:42:44 AM Confirm and document reason for call. If symptomatic, describe symptoms. ---Caller is stating that she may have a blood clot in left leg causing her extreme pain. She has a history of blood clots in the right leg. Lower leg between calf and ankle. Does the patient have any new or worsening symptoms? ---Yes Will a triage be completed? ---Yes Related visit to physician within the last 2 weeks? ---No Does the PT have any chronic conditions? (i.e. diabetes, asthma, this includes High risk factors for pregnancy, etc.) ---Yes List chronic conditions. ---diabetes, HTN Is this a behavioral health or substance abuse call? ---No Guidelines Guideline Title Affirmed Question Affirmed Notes Nurse Date/Time Eilene Ghazi Time) Leg Pain [1] Thigh or calf pain AND [2] only 1 side AND [3] present > 1 hour (Exception: chronic unchanged pain) Turner, RN, Eugene Garnet 05/16/2020 9:43:49 AM Disp. Time Eilene Ghazi Time) Disposition Final  User 05/16/2020 9:49:09 AM See HCP within 4 Hours (or PCP triage) Yes Turner, RN, Eugene Garnet PLEASE NOTE: All timestamps contained within this report are represented as [place] Standard Time. CONFIDENTIALTY NOTICE: This fax transmission is intended only for the addressee. It contains information that is legally privileged, confidential or otherwise protected from use or disclosure. If you are not the intended recipient, you are strictly prohibited from reviewing, disclosing, copying using or disseminating any of this information or taking any action in reliance on or regarding this information. If you have received this fax in error, please notify us immediately by telephone so that we can arrange for its return to [place]. Phone: [phone], Toll-Free: [phone], Fax: [phone] Page: 2 of 2 Call Id: [id number] [place] Disagree/Comply Comply Caller Understands Yes PreDisposition Call Doctor Care Advice Given Per Guideline SEE HCP (OR PCP TRIAGE) WITHIN 4 HOURS: * IF OFFICE WILL BE CLOSED AND NO PCP (PRIMARY CARE PROVIDER) SECOND-LEVEL TRIAGE: You need to be seen within the next 3 or 4 hours. A nearby Urgent Care Center [facility]) is often a good source of care. Another choice is to go to the ED. Go sooner if you become worse. CALL EMS IF: * You develop any chest pain or shortness of breath. CARE ADVICE given per Leg Pain (Adult) guideline. Comments User: Morgan Cassette, RN Date/Time Eilene Ghazi Time): 84/01/6598 3:57:01 AM [place] in office stated that there were no appts available today and that pt could go to UC. Advised pt to go to [facility] in [place]. Referrals GO TO FACILITY OTHER - SPECIFY

## 2020-05-16 NOTE — Telephone Encounter (Signed)
Aware, thanks  Will watch for correspondence

## 2020-05-22 ENCOUNTER — Other Ambulatory Visit: Payer: Self-pay | Admitting: Primary Care

## 2020-05-22 DIAGNOSIS — I82441 Acute embolism and thrombosis of right tibial vein: Secondary | ICD-10-CM

## 2020-05-23 MED ORDER — APIXABAN 5 MG PO TABS: 5.0000 mg | ORAL_TABLET | Freq: Every day | ORAL | 0 refills | Status: DC

## 2020-05-23 NOTE — Addendum Note (Signed)
Addended by: Francella Solian on: 05/23/2020 09:39 AM   Modules accepted: Orders

## 2020-05-23 NOTE — Addendum Note (Signed)
Addended by: Francella Solian on: 05/23/2020 07:38 AM   Modules accepted: Orders

## 2020-05-25 ENCOUNTER — Other Ambulatory Visit: Payer: Self-pay | Admitting: Primary Care

## 2020-05-25 DIAGNOSIS — M810 Age-related osteoporosis without current pathological fracture: Secondary | ICD-10-CM

## 2020-06-01 ENCOUNTER — Other Ambulatory Visit: Payer: Self-pay | Admitting: Primary Care

## 2020-06-01 DIAGNOSIS — I10 Essential (primary) hypertension: Secondary | ICD-10-CM

## 2020-06-20 ENCOUNTER — Other Ambulatory Visit: Payer: Self-pay

## 2020-06-20 ENCOUNTER — Ambulatory Visit
Admission: EM | Admit: 2020-06-20 | Discharge: 2020-06-20 | Disposition: A | Discharge status: Home / Self Care | Payer: Medicare Other | Attending: Family Medicine | Admitting: Family Medicine

## 2020-06-20 DIAGNOSIS — M79631 Pain in right forearm: Secondary | ICD-10-CM | POA: Diagnosis not present

## 2020-06-20 DIAGNOSIS — L03113 Cellulitis of right upper limb: Secondary | ICD-10-CM | POA: Diagnosis not present

## 2020-06-20 MED ORDER — CEPHALEXIN 500 MG PO CAPS: 500.0000 mg | ORAL_CAPSULE | Freq: Four times a day (QID) | ORAL | 0 refills | Status: DC

## 2020-06-20 NOTE — ED Provider Notes (Signed)
[facility]   [id number] 06/20/20 Arrival Time: 1212  CC: RASH  SUBJECTIVE:  Morgan Bowen is a 84 y.o. female who presents with a skin complaint that began last week. Reports that she fell and had a skin tear to the R forearm. The area seemed to be healing, but she has had an increase in redness and pain over the last 2 days. Denies precipitating event or trauma. Denies changes in soaps, detergents, close contacts with similar rash, known trigger or environmental trigger, allergy. Denies medications change or starting a new medication recently. There are no aggravating or alleviating factors. Denies similar symptoms in the past. Denies fever, chills, nausea, vomiting, discharge, oral lesions, SOB, chest pain, abdominal pain, changes in bowel or bladder function.    ROS: As per HPI.  All other pertinent ROS negative.     Past Medical History:  Diagnosis Date   Acute cystitis 05/23/2018   Anxiety    Arthritis    Cellulitis 12/13/2018   Chickenpox    Diabetes mellitus without complication (HCC)    GERD (gastroesophageal reflux disease)    Hypertension    Osteoporosis    Type 2 diabetes mellitus ([place])    Urinary tract infection    Past Surgical History:  Procedure Laterality Date   ABDOMINAL HYSTERECTOMY  1984   ABDOMINAL HYSTERECTOMY     APPENDECTOMY  1958   CHOLECYSTECTOMY     GALLBLADDER SURGERY     MANDIBLE FRACTURE SURGERY  07/13/2016   TONSILLECTOMY  1958   TONSILLECTOMY     WRIST FRACTURE SURGERY Right    2000   Allergies  Allergen Reactions   Sulfamethoxazole-Trimethoprim    Penicillins Rash   No current facility-administered medications on file prior to encounter.   Current Outpatient Medications on File Prior to Encounter  Medication Sig Dispense Refill   amLODipine (NORVASC) 10 MG tablet TAKE 1 TABLET BY MOUTH EVERY DAY FOR BLOOD PRESSURE 90 tablet 0   apixaban (ELIQUIS) 5 MG TABS tablet Take 1 tablet (5 mg total) by mouth  daily. 90 tablet 0   Calcium-Magnesium-Vitamin D [phone] MG-MG-UNIT TABS Take 1 tablet by mouth daily.     cloNIDine (CATAPRES) 0.1 MG tablet TAKE 1 TABLET BY MOUTH ONCE DAILY AS NEEDED FOR SYSTOLIC BLOOD PRESSURE ABOVE 170 (Patient not taking: Reported on 05/05/2020) 30 tablet 1   diclofenac Sodium (VOLTAREN) 1 % GEL diclofenac 1 % topical gel     diltiazem (TIAZAC) 300 MG 24 hr capsule diltiazem CD 300 mg capsule,extended release 24 hr     glimepiride (AMARYL) 2 MG tablet TAKE 1.5 TABLETS (3 MG TOTAL) BY MOUTH DAILY WITH BREAKFAST. 135 tablet 0   glucose blood (ONE TOUCH ULTRA TEST) test strip USE AS DIRECTED TEST 3 TIMES DAILY E11.9 100 each 2   irbesartan (AVAPRO) 300 MG tablet TAKE 1 TABLET BY MOUTH EVERY DAY 90 tablet 1   losartan (COZAAR) 50 MG tablet losartan 50 mg tablet  TAKE 1 TABLET BY MOUTH EVERY DAY (Patient not taking: Reported on 04/21/2020)     metFORMIN (GLUCOPHAGE) 500 MG tablet TAKE 1 TABLET BY MOUTH TWICE DAILY WITH FOOD FOR DIABETES. 180 tablet 1   metoprolol succinate (TOPROL-XL) 25 MG 24 hr tablet TAKE 1 TABLET BY MOUTH EVERY DAY FOR BLOOD PRESSURE 90 tablet 1   mupirocin ointment (BACTROBAN) 2 % mupirocin 2 % topical ointment     triamcinolone cream (KENALOG) 0.1 % Apply 1 application topically 2 (two) times daily. 30 g 0  UNABLE TO FIND USE AS DIRECTED 3 TIMES A DAY     Vitamin D, Ergocalciferol, (DRISDOL) 1.25 MG (50000 UNIT) CAPS capsule TAKE 1 CAPSULE BY MOUTH ONE TIME PER WEEK 12 capsule 0   Social History   Socioeconomic History   Marital status: Widowed    Spouse name: Not on file   Number of children: Not on file   Years of education: Not on file   Highest education level: Not on file  Occupational History   Not on file  Tobacco Use   Smoking status: Never Smoker   Smokeless tobacco: Never Used  Vaping Use   Vaping Use: Never used  Substance and Sexual Activity   Alcohol use: No   Drug use: No   Sexual activity: Not  Currently  Other Topics Concern   Not on file  Social History Narrative   ** Merged History Encounter **       Widowed. Moved from [place]. 1 son, 3 grandchildren. Retired. Once worked as a Agricultural engineer.    Social Determinants of Health   Financial Resource Strain: Low Risk    Difficulty of Paying Living Expenses: Not hard at all  Food Insecurity: No Food Insecurity   Worried About Charity fundraiser in the Last Year: Never true   [place] in the Last Year: Never true  Transportation Needs: No Transportation Needs   Lack of Transportation (Medical): No   Lack of Transportation (Non-Medical): No  Physical Activity: Inactive   Days of Exercise per Week: 0 days   Minutes of Exercise per Session: 0 min  Stress: No Stress Concern Present   Feeling of Stress : Not at all  Social Connections:    Frequency of Communication with Friends and Family: Not on file   Frequency of Social Gatherings with Friends and Family: Not on file   Attends Religious Services: Not on Electrical engineer or Organizations: Not on file   Attends Archivist Meetings: Not on file   Marital Status: Not on file  Intimate Partner Violence: Not At Risk   Fear of Current or Ex-Partner: No   Emotionally Abused: No   Physically Abused: No   Sexually Abused: No   Family History  Problem Relation Age of Onset   Hypertension Mother    Stroke Mother    Stroke Father    Hypertension Father    Hypertension Sister    Kidney disease Sister    Breast cancer Neg Hx     OBJECTIVE: Vitals:   06/20/20 1218 06/20/20 1219  BP: (!) 173/75   Pulse: 85   Resp: 16   Temp: 98.5 F (36.9 C)   TempSrc: Oral   SpO2: 98%   Weight:  125 lb (56.7 kg)  Height:  5\' 5"  (1.651 m)    General appearance: alert; no distress Head: NCAT Lungs: clear to auscultation bilaterally Heart: regular rate and rhythm.  Radial pulse 2+ bilaterally Extremities: no edema Skin:  warm and dry; skin tear present to R forearm with erythema, warmth and tender to touch Psychological: alert and cooperative; normal mood and affect  ASSESSMENT & PLAN:  1. Cellulitis of right upper extremity   2. Right forearm pain     Meds ordered this encounter  Medications   cephALEXin (KEFLEX) 500 MG capsule    Sig: Take 1 capsule (500 mg total) by mouth 4 (four) times daily.    Dispense:  20 capsule  Refill:  0    Order Specific Question:   Supervising Provider    Answer:   Chase Picket [[id number]]    Prescribed Keflex Take as prescribed and to completion Avoid hot showers/ baths Moisturize skin daily  Follow up with PCP if symptoms persists Return or go to the ER if you have any new or worsening symptoms such as fever, chills, nausea, vomiting, redness, swelling, discharge, if symptoms do not improve with medications  Reviewed expectations re: course of current medical issues. Questions answered. Outlined signs and symptoms indicating need for more acute intervention. Patient verbalized understanding. After Visit Summary given.   Faustino Congress, NP 06/20/20 1605

## 2020-06-20 NOTE — Discharge Instructions (Signed)
I have sent in Keflex for you to take four times per day for 5 days  Follow up with this office or with primary care if symptoms are persisting.  Follow up in the ER for high fever, trouble swallowing, trouble breathing, other concerning symptoms.

## 2020-06-20 NOTE — ED Triage Notes (Addendum)
Pt reports  Scraping R arm 1 week ago when getting out of the shower. Pt is unsure what it was scraped on. Area has some redness, and warm to touch.

## 2020-07-09 ENCOUNTER — Other Ambulatory Visit: Payer: Self-pay | Admitting: Primary Care

## 2020-07-09 DIAGNOSIS — I82441 Acute embolism and thrombosis of right tibial vein: Secondary | ICD-10-CM

## 2020-07-09 DIAGNOSIS — E119 Type 2 diabetes mellitus without complications: Secondary | ICD-10-CM

## 2020-07-09 DIAGNOSIS — I10 Essential (primary) hypertension: Secondary | ICD-10-CM

## 2020-07-09 DIAGNOSIS — M810 Age-related osteoporosis without current pathological fracture: Secondary | ICD-10-CM

## 2020-07-11 NOTE — Telephone Encounter (Signed)
Pharmacy requests refill on: Eliquis 5 mg   LAST REFILL: 05/23/2020 (Q-90, R-0) LAST OV: 04/21/2020 NEXT OV: 10/06/2020 PHARMACY: [id number] [place], [place]   *Too early fo refill  Pharmacy requests refill on: Amlodipine Besylate 10 mg   LAST REFILL: 06/03/2020 (Q-90, R-0) LAST OV: 04/21/2020 NEXT OV: 10/07/2019 PHARMACY: [id number] [place], [place]   *Too early for refill  Pharmacy requests refill on: Vitamin D2 1.25 mg   LAST REFILL:05/26/2020 (Q-12, R-0)  LAST OV: 04/21/2020 NEXT OV: 02/28/202 PHARMACY: [id number] [place], [place] requests refill CW:TPELGKBOQUC 2 mg    LAST REFILL: 04/19/2020 (Q-135, R-0) LAST OV: 04/21/2020 NEXT OV: 10/06/2020 PHARMACY: [id number] [place], [place]  Hgb A1C (03/31/2020): 7.7  Pharmacy requests refill on: Irbesartan 300 mg   LAST REFILL: 03/11/2020 (Q-90, R-1)  LAST OV: 04/21/2020 NEXT OV: 10/06/2020 PHARMACY: [id number] [place], [place]   *Too early for refill

## 2020-07-14 ENCOUNTER — Other Ambulatory Visit: Payer: Self-pay

## 2020-07-14 ENCOUNTER — Ambulatory Visit: Payer: Medicare Other

## 2020-07-14 DIAGNOSIS — I10 Essential (primary) hypertension: Secondary | ICD-10-CM

## 2020-07-14 DIAGNOSIS — E119 Type 2 diabetes mellitus without complications: Secondary | ICD-10-CM

## 2020-07-14 NOTE — Chronic Care Management (AMB) (Signed)
Chronic Care Management Pharmacy  Name: Morgan Bowen  MRN: [id number] DOB: 08/01/1936  Chief Complaint/ HPI  [name],  84 y.o., female presents for their Follow-Up CCM visit with the clinical pharmacist via telephone.  PCP : [name], NP  Their chronic conditions include: HTN, type 2 diabetes, vitamin D deficiency, osteoporosis, anxiety, depression  Patient concerns: denies medication or health concerns, reports cellulitis has cleared up on her arm  Office Visits:  05/05/20: CCM Emeterio Reeve) - discussed osteoporosis treatment   04/21/20: Ok Edwards Carlis Abbott, Anda Kraft) - follow-up from ED visit for palpitations. Checking TSH and repeat Magnesium level.   04/03/20: Theodoro Clock, Anda Kraft) - continue Eliquis as prescribed for 3-6 months. Increase activity level, calcium and vitamin D, healthy diet. Patient declined bisphosphonate and Prolia.   04/01/20: AWV with nursing - patient declined COVID vaccine. Educated on Shingrix vaccine.   03/27/20: Maretta Bees, [place]) - right calf pain. Ruling out DVT with doppler. Doppler positive for DVT in the calf. Begin Eliquis 5 mg - 2 tablets twice daily for 10 days then take 1 tablet bid therafter.   11/21/19 OV Einar Pheasant, [place]) - dermatitis. Advised to avoid new dog until symptoms improved. Started on triamcinolone 0.1% cream twice daily  10/02/19 OV Carlis Abbott, [place]) routine follow-up. A1c 8.6% from 7.9% (07/02/19). Patient denies statin therapy. No changes with other meds. F/u visit every 3 months.  Consult Visit:  06/20/20 ED - cellulitis upper extremity, rx keflex  05/16/20 ED - left leg pain, no dvt on ultrasounds, pt had DVT 1 month ago, compliant to Eliquis   04/21/20 OV (podiatrist) - nail debridement  04/14/20 ED - palpitations. No medication changes upon discharge. Pulse 90-120 bpm.   04/12/20 ED - cystitis. Macrobid prescribed.   04/02/20 OV (Dermatology) - benign lesions. Cryotherapy performed.   01/16/17 OV Prudence Davidson, Podiatry) - Onychomycosis. Pain in  the right and left toes.   Medications: Outpatient Encounter Medications as of 07/14/2020  Medication Sig   Calcium-Magnesium-Vitamin D [phone] MG-MG-UNIT TABS Take 1 tablet by mouth daily.   cephALEXin (KEFLEX) 500 MG capsule Take 1 capsule (500 mg total) by mouth 4 (four) times daily.   diclofenac Sodium (VOLTAREN) 1 % GEL diclofenac 1 % topical gel   glimepiride (AMARYL) 2 MG tablet TAKE 1.5 TABLETS (3 MG TOTAL) BY MOUTH DAILY WITH BREAKFAST.   glucose blood (ONE TOUCH ULTRA TEST) test strip USE AS DIRECTED TEST 3 TIMES DAILY E11.9   metFORMIN (GLUCOPHAGE) 500 MG tablet TAKE 1 TABLET BY MOUTH TWICE DAILY WITH FOOD FOR DIABETES. (Patient taking differently: 1,000 mg 2 (two) times daily with a meal. Take 2 tablets by mouth twice daily with food for diabetes.)   mupirocin ointment (BACTROBAN) 2 % mupirocin 2 % topical ointment   triamcinolone cream (KENALOG) 0.1 % Apply 1 application topically 2 (two) times daily.   UNABLE TO FIND USE AS DIRECTED 3 TIMES A DAY   Vitamin D, Ergocalciferol, (DRISDOL) 1.25 MG (50000 UNIT) CAPS capsule TAKE 1 CAPSULE BY MOUTH ONE TIME PER WEEK   [DISCONTINUED] amLODipine (NORVASC) 10 MG tablet TAKE 1 TABLET BY MOUTH EVERY DAY FOR BLOOD PRESSURE   [DISCONTINUED] apixaban (ELIQUIS) 5 MG TABS tablet Take 1 tablet (5 mg total) by mouth daily.   [DISCONTINUED] cloNIDine (CATAPRES) 0.1 MG tablet TAKE 1 TABLET BY MOUTH ONCE DAILY AS NEEDED FOR SYSTOLIC BLOOD PRESSURE ABOVE 170 (Patient not taking: Reported on 05/05/2020)   [DISCONTINUED] diltiazem (TIAZAC) 300 MG 24 hr capsule diltiazem CD 300 mg  capsule,extended release 24 hr   [DISCONTINUED] irbesartan (AVAPRO) 300 MG tablet TAKE 1 TABLET BY MOUTH EVERY DAY   [DISCONTINUED] losartan (COZAAR) 50 MG tablet losartan 50 mg tablet  TAKE 1 TABLET BY MOUTH EVERY DAY (Patient not taking: Reported on 04/21/2020)   [DISCONTINUED] metoprolol succinate (TOPROL-XL) 25 MG 24 hr tablet TAKE 1 TABLET BY MOUTH EVERY  DAY FOR BLOOD PRESSURE   No facility-administered encounter medications on file as of 07/14/2020.   Current Diagnosis/Assessment:   Goals Addressed            This Visit's Progress    Chronic Care Management       CARE PLAN ENTRY  Current Barriers:   Chronic Disease Management support, education, and care coordination needs related to Hypertension and Diabetes   Hypertension BP Readings from Last 3 Encounters:  06/20/20 (!) 173/75  05/16/20 (!) 168/60  04/21/20 140/78   Pharmacist Clinical Goal(s): o Over the next 12 weeks, patient will work with PharmD and providers to maintain BP goal <140/90 mmHg  Current regimen:   Amlodipine 10 mg - 1 tablet once daily  Irbesartan 300 mg - 1 tablet once daily  Metoprolol succinate 25 mg - 1 tablet once daily  Clonidine 0.1 mg  - 1 tablet daily for systolic blood pressure above 170  Interventions: o Reviewed home blood pressure readings.  o Discussed daily walking for exercise.   Patient self care activities - Over the next 12 weeks, patient will: o Continue to monitor blood pressure several days per week, document, and provide at future appointments o Ensure daily salt intake < 2300 mg/day  Diabetes Lab Results  Component Value Date/Time   HGBA1C 7.7 (H) 03/31/2020 08:24 AM   HGBA1C 8.6 (H) 10/02/2019 08:34 AM   Pharmacist Clinical Goal(s): o Over the next 12 weeks, patient will work with PharmD and providers to achieve A1c goal <7%  Current regimen:   Glimepiride 2 mg - 1 and 1/2 tablet once daily with breakfast  Metformin 500 mg - 2 tablets (1000 mg) twice daily with food   Interventions: o Discuss dietary and exercise goals  o Reviewed home blood sugar readings.   Patient self care activities - Over the next 12 weeks, patient will: o Check blood sugar once daily, document, and provide at future appointment o Contact provider with any episodes of hypoglycemia o Purchase a larger pillbox for your metformin    Medication Cost:  [place] prefer [facility] or Product/process development scientist.    You can also call [phone] to reach a local [place] counselor who will help you compare Medicare plans and answer any questions.  My assistant will call you in January to help review BP medication cost.  Please see past updates related to this goal by clicking on the "Past Updates" button in the selected goal      Patient Stated   On track    Starting 03/16/2018, I will continue to take medications as prescribed.       Hypertension   CMP Latest Ref Rng & Units 04/13/2020 04/12/2020 03/31/2020  Glucose 70 - 99 mg/dL 268(H) 207(H) 164(H)  BUN 8 - 23 mg/dL 16 16 20   Creatinine 0.44 - 1.00 mg/dL 0.91 0.89 0.94  Sodium 135 - 145 mmol/L 139 136 137  Potassium 3.5 - 5.1 mmol/L 4.1 4.4 4.5  Chloride 98 - 111 mmol/L 106 102 102  CO2 22 - 32 mmol/L 22 22 25   Calcium 8.9 - 10.3 mg/dL  9.4 9.3 9.4  Total Protein 6.0 - 8.3 g/dL - - 7.7  Total Bilirubin 0.2 - 1.2 mg/dL - - 0.6  Alkaline Phos 39 - 117 U/L - - 64  AST 0 - 37 U/L - - 17  ALT 0 - 35 U/L - - 16   Office blood pressures are  BP Readings from Last 3 Encounters:  06/20/20 (!) 173/75  05/16/20 (!) 168/60  04/21/20 140/78   BP goal < 140/90 mmHg Patient has failed these meds in the past: diltiazem, losartan, valsartan Patient is currently controlled on the following medications:   Amlodipine 10 mg - 1 tablet once daily  Irbesartan 300 mg - 1 tablet once daily  Metoprolol succinate 25 mg - 1 tablet once daily  PRN:  Clonidine 0.1 mg - 1 tablet once daily as needed for systolic blood pressure above 170  Patient checks BP at home: 3-5 days/week with [employer] arm monitor   Update 05/05/2020 - Recent blood pressure readings have been wonderful since medication changes. BP has been 126-133/60-65 mmHg. Has not had to use clonidine since adding amlodipine. Patient reports that one of her blood pressure medications is $60. She is going to ask Anda Kraft to  call in a future refill to [facility] to determine if cheaper since preferred pharmacy.   Update 07/14/20:Recent blood pressure ranging 120-130/60-70s. Prescriptions sent to [facility] for price check. Unable to check until closer to next fill date. Pt reports some difficulty dealing with pharmacy changes/cost so we will check these for her.   Plan: Continue current medications; will have CMA check price on Irebsartan at [facility] and contact patient.   Diabetes   Reports diagnosed in 1996; reports A1c was controlled until last 12 months.   Recent Relevant Labs: Lab Results  Component Value Date/Time   HGBA1C 7.7 (H) 03/31/2020 08:24 AM   HGBA1C 8.6 (H) 10/02/2019 08:34 AM   GFR 56.99 (L) 03/31/2020 08:24 AM   GFR 57.06 (L) 10/02/2019 08:34 AM    Kidney Function Lab Results  Component Value Date/Time   CREATININE 0.91 04/13/2020 09:37 PM   CREATININE 0.89 04/12/2020 05:09 AM   GFR 56.99 (L) 03/31/2020 08:24 AM   GFRNONAA 59 (L) 04/13/2020 09:37 PM   GFRAA >60 04/13/2020 09:37 PM   K 4.1 04/13/2020 09:37 PM   K 4.4 04/12/2020 05:09 AM   Checking BG: every other day or so - forgets to check daily  Usually checks after breakfast and late afternoon and reports blood glucose is erratic. Unable to provide range as she reports it just varies too much.  A1c goal < 7% Patient has failed these meds in past: None Patient is currently uncontrolled on the following medications:   Glimepiride 2 mg - 1 and 1/2 tablet once daily with breakfast  Metformin 500 mg - 1 tablet twice daily with meals   Last diabetic eye exam:  Lab Results  Component Value Date/Time   HMDIABEYEEXA No Retinopathy 08/01/2019 12:00 AM    Last diabetic foot exam:  10/23/19 Statin - declines ARB - On irbesartan   Update 05/05/2020 - Maintaining blood sugar with diet. Patient reports that she was drinking regular soda most days during the quarantine out of habit. She is working to improve diet.  Patient has tried to resume activity since August's blood clot. She walks 15 minutes daily right now indoor and outdoor. Recent blood sugar readings have been erratic. Patient likes to see 135 mg/dL and sees that reading most mornings. Denies  any low blood sugar readings.    Update 07/11/20: Reports home BG is good. Walking daily outside for about 10-15 minute since August, thus A1c may be lower. Reports taking glimepiride 1 and 1/2 every morning between 7-8 AM, and metformin 2 tablets around 11 AM and bedtime (4 tablets daily -this differs from rx). Denies running out early despite taking double dose than prescribed. Reports she does miss a dose every now and then. Reports she uses a pillbox for everything except the metformin because it does not fit in the pillbox.   Plan: Continue current medications; Recommend purchasing larger pill box to improve metformin adherence. Continue taking metformin 500 mg, 2 tablets BID per patient report. Updated medication list.   Vitamin D Deficiency   Lab Results  Component Value Date   VD25OH 67.95 03/31/2020   Patient has failed these meds in past: none reported Patient is currently controlled on the following medications:   Vitamin D 50,000 units - 1 capsule once weekly  We discussed: confirmed adherence  No update/changes 07/14/20  Plan: Continue current medications   Osteopenia / Osteoporosis    VITD  Date Value Ref Range Status  03/31/2020 67.95 30.00 - 100.00 ng/mL Final    Last DEXA Scan: 07/23/2019             T-Score femoral neck: -2.7             T-Score forearm radius: -3.3  Patient is a candidate for pharmacologic treatment due to history of hip fracture  and history of vertebral fracture  Patient has failed these meds in past: none reported Patient is currently uncontrolled on the following medications:   Calcium/magnesium supplement daily  Vitamin D 50000 units weekly   We discussed: Recommend 1200 mg of calcium daily from  dietary and supplemental sources. Recommend weight-bearing and muscle strengthening exercises for building and maintaining bone density.   Diet: patient eats dark leafy green vegetables daily. Eats cheese, yogurt and cottage cheese.   Update 05/05/20: Patient has been concerned to start Prolia or bisphosphonate due to commercials. Encouraged patient to reconsider due to risk of future fracture or broken bone. Patient will think about it and let Anda Kraft or pharmacist know.   Update 07/14/20:Patient reports she has thought about treatment and plans to discuss starting Prolia with Anda Kraft at February 2022 appt.   Plan: Continue control with diet and exercise. Consider bisphosphonate due to osteoporosis and history of vertebral fracture.  Dermatitis   Patient is currently controlled on the following medications:   Triamcinolone 0.1% cream - apply to affected area twice daily  We discussed: takes PRN, works well   No update/changes 07/14/20  Plan: Continue current medications   Depression   Patient has failed these meds in past: none Patient is currently controlled on the following medications:   No pharmacotherapy  We discussed: Denies taking sertraline, reports she never started the medication. Denies interest in medications and denies anxiety or depressed symptoms.   No update/changes 07/14/20  Plan: Continue management without medication.  Vaccines   Reviewed and discussed patient's vaccination history.        Immunization History  Administered Date(s) Administered   Influenza, High Dose Seasonal PF 05/09/2012   Influenza,inj,Quad PF,6+ Mos 03/30/2019   PFIZER SARS-COV-2 Vaccination 04/08/2020, 04/29/2020   Pneumococcal Conjugate-13 10/24/2017   Pneumococcal Polysaccharide-23 08/23/2013   Tdap 07/14/2015   Zoster 08/06/2013, 12/08/2014    Plan: Recommended patient receive annual flu vaccine in office.   OTCs/Health Maintenance  Patient is currently on the  following medications:  OneTouch Ultra test strips   A-F Betafood - PRN indigestion  B-complex - 1 tablet every other day   Vitamin C - 1 tablet daily  Vitamin E - 1 tablet daily  No update/changes 07/14/20  Plan: Continue current medications  Medication Management   Pharmacy/Benefits: [facility] [place]: reports BP medication and test strips are expensive Adherence: < 5 day gap between refills  We discussed: Prices may be lower at your insurance preferred pharmacies - [facility], [facility]   Update 07/14/20: [facility]ed [facility].  Follow up: 3 months, telephone visit (10/18/19 at 8:30 AM); CMA call in January to review BP medication cost concerns/metformin adherence   [name], PharmD Clinical Pharmacist [place] [facility] [facility] at [facility] [phone]

## 2020-07-17 ENCOUNTER — Telehealth: Payer: Self-pay

## 2020-07-17 DIAGNOSIS — I10 Essential (primary) hypertension: Secondary | ICD-10-CM

## 2020-07-17 MED ORDER — AMLODIPINE BESYLATE 10 MG PO TABS: ORAL_TABLET | ORAL | 3 refills | Status: DC

## 2020-07-17 MED ORDER — IRBESARTAN 300 MG PO TABS: 300.0000 mg | ORAL_TABLET | Freq: Every day | ORAL | 3 refills | Status: DC

## 2020-07-17 MED ORDER — METOPROLOL SUCCINATE ER 25 MG PO TB24: ORAL_TABLET | ORAL | 3 refills | Status: DC

## 2020-07-17 NOTE — Telephone Encounter (Signed)
Spoke to patient she is trying to walk daily. For over 15 minutes daily. Patient is in agreeing to stopping eliquis if you are ok with it.

## 2020-07-17 NOTE — Telephone Encounter (Signed)
Ok to send in.  

## 2020-07-17 NOTE — Telephone Encounter (Signed)
Please notify patient that the Eliquis medication was for a duration of 3 months total.  It looks like she started this medication in late August, so she would be finished with her medication by late November. Has she been taking this twice daily?   It seems as though her blood clot was provoked by her sedentary lifestyle, if she still sedentary?  Or is she more active than she was this spring and summer?  If she has remained active then I recommend we discontinue her Eliquis given the risk for bleeding from falls. If she remains sedentary then we may need to discuss the risks of falls with Eliquis on continued treatment versus discontinuation.  Let me know.  Will send new Rxs for other medications to pharmacy.

## 2020-07-17 NOTE — Telephone Encounter (Signed)
Patient called today and she will be out of Eliquis tomorrow 12/10. She reports the pharmacy is telling her to call us for refills but we are telling her to call the pharmacy. [facility]? I will also call them to see if there is an issue we can resolve so this doesn't happen in the future.  Also sent the message below to [facility] earlier this week. Could we send her BP meds to [facility] to check price?  ===View-only below this line=== ----- Message ----- From: [name], [facility] Sent: 07/14/2020  10:50 AM EST To: Francella Solian, CMA Subject: Refill request                                 Pt would like to check prices on her BP medications at [facility]. Could we send new prescriptions for metoprolol, irbesartan, and amlodipine to [place], [place]?   [name], PharmD Clinical Pharmacist [place] [facility] at [facility] [phone]

## 2020-07-17 NOTE — Telephone Encounter (Signed)
Noted, will discontinue given 3 month compliance, increased activity, and risks for bleeding given age/falls.

## 2020-07-18 ENCOUNTER — Ambulatory Visit: Payer: Self-pay

## 2020-07-18 NOTE — Chronic Care Management (AMB) (Signed)
Called patient to review BP medication price with insurance preferred pharmacy [facility]) [facility] Pharmacy unable to provide price at this time until she is due for refill. [facility] and consider change once she is running low on irbesartan. Patient also wanted to be sure she didn't need to wean off Eliquis. Discussed no need to wean off. Patient also concerned about risk of recurrent DVT. Reassured her of appropriate treatment duration for provoked DVT 3 months. Discussed risks versus benefits of continuation of therapy.  [name], PharmD Clinical Pharmacist [place] [facility] at [facility] [phone]

## 2020-07-20 NOTE — Patient Instructions (Signed)
Dear Morgan Bowen,  Below is a summary of the goals we discussed during our follow up appointment on July 20, 2020. Please contact me anytime with questions or concerns.   Visit Information  Goals Addressed            This Visit's Progress    Chronic Care Management       CARE PLAN ENTRY  Current Barriers:   Chronic Disease Management support, education, and care coordination needs related to Hypertension and Diabetes   Hypertension BP Readings from Last 3 Encounters:  06/20/20 (!) 173/75  05/16/20 (!) 168/60  04/21/20 140/78   Pharmacist Clinical Goal(s): o Over the next 12 weeks, patient will work with PharmD and providers to maintain BP goal <140/90 mmHg  Current regimen:   Amlodipine 10 mg - 1 tablet once daily  Irbesartan 300 mg - 1 tablet once daily  Metoprolol succinate 25 mg - 1 tablet once daily  Clonidine 0.1 mg  - 1 tablet daily for systolic blood pressure above 170  Interventions: o Reviewed home blood pressure readings.  o Discussed daily walking for exercise.   Patient self care activities - Over the next 12 weeks, patient will: o Continue to monitor blood pressure several days per week, document, and provide at future appointments o Ensure daily salt intake < 2300 mg/day  Diabetes Lab Results  Component Value Date/Time   HGBA1C 7.7 (H) 03/31/2020 08:24 AM   HGBA1C 8.6 (H) 10/02/2019 08:34 AM   Pharmacist Clinical Goal(s): o Over the next 12 weeks, patient will work with PharmD and providers to achieve A1c goal <7%  Current regimen:   Glimepiride 2 mg - 1 and 1/2 tablet once daily with breakfast  Metformin 500 mg - 2 tablets (1000 mg) twice daily with food   Interventions: o Discuss dietary and exercise goals  o Reviewed home blood sugar readings.   Patient self care activities - Over the next 12 weeks, patient will: o Check blood sugar once daily, document, and provide at future appointment o Contact provider with any episodes of  hypoglycemia o Purchase a larger pillbox for your metformin   Medication Cost:  [place] prefer [facility] or Product/process development scientist.    You can also call [phone] to reach a local [place] counselor who will help you compare Medicare plans and answer any questions.  My assistant will call you in January to help review BP medication cost.  Please see past updates related to this goal by clicking on the "Past Updates" button in the selected goal      Patient Stated   On track    Starting 03/16/2018, I will continue to take medications as prescribed.        The patient verbalized understanding of instructions, educational materials, and care plan provided today and agreed to receive a mailed copy of patient instructions, educational materials, and care plan.   The pharmacy team will reach out to the patient again over the next 30 days.   [name], PharmD Clinical Pharmacist [facility] at [facility] [phone]   Diabetes Mellitus and Nutrition, Adult When you have diabetes (diabetes mellitus), it is very important to have healthy eating habits because your blood sugar (glucose) levels are greatly affected by what you eat and drink. Eating healthy foods in the appropriate amounts, at about the same times every day, can help you:  Control your blood glucose.  Lower your risk of heart disease.  Improve your  blood pressure.  Reach or maintain a healthy weight. Every person with diabetes is different, and each person has different needs for a meal plan. Your health care provider may recommend that you work with a diet and nutrition specialist (dietitian) to make a meal plan that is best for you. Your meal plan may vary depending on factors such as:  The calories you need.  The medicines you take.  Your weight.  Your blood glucose, blood pressure, and cholesterol levels.  Your activity level.  Other health conditions you have, such as heart or  kidney disease. How do carbohydrates affect me? Carbohydrates, also called carbs, affect your blood glucose level more than any other type of food. Eating carbs naturally raises the amount of glucose in your blood. Carb counting is a method for keeping track of how many carbs you eat. Counting carbs is important to keep your blood glucose at a healthy level, especially if you use insulin or take certain oral diabetes medicines. It is important to know how many carbs you can safely have in each meal. This is different for every person. Your dietitian can help you calculate how many carbs you should have at each meal and for each snack. Foods that contain carbs include:  Bread, cereal, rice, pasta, and crackers.  Potatoes and corn.  Peas, beans, and lentils.  Milk and yogurt.  Fruit and juice.  Desserts, such as cakes, cookies, ice cream, and candy. How does alcohol affect me? Alcohol can cause a sudden decrease in blood glucose (hypoglycemia), especially if you use insulin or take certain oral diabetes medicines. Hypoglycemia can be a life-threatening condition. Symptoms of hypoglycemia (sleepiness, dizziness, and confusion) are similar to symptoms of having too much alcohol. If your health care provider says that alcohol is safe for you, follow these guidelines:  Limit alcohol intake to no more than 1 drink per day for nonpregnant women and 2 drinks per day for men. One drink equals 12 oz of beer, 5 oz of wine, or 1 oz of hard liquor.  Do not drink on an empty stomach.  Keep yourself hydrated with water, diet soda, or unsweetened iced tea.  Keep in mind that regular soda, juice, and other mixers may contain a lot of sugar and must be counted as carbs. What are tips for following this plan?  Reading food labels  Start by checking the serving size on the "Nutrition Facts" label of packaged foods and drinks. The amount of calories, carbs, fats, and other nutrients listed on the label is  based on one serving of the item. Many items contain more than one serving per package.  Check the total grams (g) of carbs in one serving. You can calculate the number of servings of carbs in one serving by dividing the total carbs by 15. For example, if a food has 30 g of total carbs, it would be equal to 2 servings of carbs.  Check the number of grams (g) of saturated and trans fats in one serving. Choose foods that have low or no amount of these fats.  Check the number of milligrams (mg) of salt (sodium) in one serving. Most people should limit total sodium intake to less than 2,300 mg per day.  Always check the nutrition information of foods labeled as "low-fat" or "nonfat". These foods may be higher in added sugar or refined carbs and should be avoided.  Talk to your dietitian to identify your daily goals for nutrients listed on the label.  Shopping  Avoid buying canned, premade, or processed foods. These foods tend to be high in fat, sodium, and added sugar.  Shop around the outside edge of the grocery store. This includes fresh fruits and vegetables, bulk grains, fresh meats, and fresh dairy. Cooking  Use low-heat cooking methods, such as baking, instead of high-heat cooking methods like deep frying.  Cook using healthy oils, such as olive, canola, or sunflower oil.  Avoid cooking with butter, cream, or high-fat meats. Meal planning  Eat meals and snacks regularly, preferably at the same times every day. Avoid going long periods of time without eating.  Eat foods high in fiber, such as fresh fruits, vegetables, beans, and whole grains. Talk to your dietitian about how many servings of carbs you can eat at each meal.  Eat 4-6 ounces (oz) of lean protein each day, such as lean meat, chicken, fish, eggs, or tofu. One oz of lean protein is equal to: ? 1 oz of meat, chicken, or fish. ? 1 egg. ?  cup of tofu.  Eat some foods each day that contain healthy fats, such as avocado,  nuts, seeds, and fish. Lifestyle  Check your blood glucose regularly.  Exercise regularly as told by your health care provider. This may include: ? 150 minutes of moderate-intensity or vigorous-intensity exercise each week. This could be brisk walking, biking, or water aerobics. ? Stretching and doing strength exercises, such as yoga or weightlifting, at least 2 times a week.  Take medicines as told by your health care provider.  Do not use any products that contain nicotine or tobacco, such as cigarettes and e-cigarettes. If you need help quitting, ask your health care provider.  Work with a Social worker or diabetes educator to identify strategies to manage stress and any emotional and social challenges. Questions to ask a health care provider  Do I need to meet with a diabetes educator?  Do I need to meet with a dietitian?  What number can I call if I have questions?  When are the best times to check my blood glucose? Where to find more information:  American Diabetes Association: diabetes.org  Academy of Nutrition and Dietetics: www.eatright.[employer] of Diabetes and Digestive and Kidney Diseases (NIH): [employer] Summary  A healthy meal plan will help you control your blood glucose and maintain a healthy lifestyle.  Working with a diet and nutrition specialist (dietitian) can help you make a meal plan that is best for you.  Keep in mind that carbohydrates (carbs) and alcohol have immediate effects on your blood glucose levels. It is important to count carbs and to use alcohol carefully. This information is not intended to replace advice given to you by your health care provider. Make sure you discuss any questions you have with your health care provider. Document Revised: 07/08/2017 Document Reviewed: 08/30/2016 Elsevier Patient Education  2020 [employer].

## 2020-07-21 ENCOUNTER — Ambulatory Visit: Payer: Medicare Other | Admitting: Podiatry

## 2020-08-13 ENCOUNTER — Telehealth: Payer: Self-pay

## 2020-08-13 NOTE — Chronic Care Management (AMB) (Addendum)
Chronic Care Management Pharmacy Assistant   Name: Morgan Bowen  MRN: [id number] DOB: 03-15-1936  Reason for Encounter: Disease State  Patient Questions:  1.  Have you seen any other providers since your last visit? No  2.  Any changes in your medicines or health? No   PCP : [name], NP  Allergies:   Allergies  Allergen Reactions   Sulfamethoxazole-Trimethoprim    Penicillins Rash    Medications: Outpatient Encounter Medications as of 08/13/2020  Medication Sig   amLODipine (NORVASC) 10 MG tablet TAKE 1 TABLET BY MOUTH EVERY DAY FOR BLOOD PRESSURE   Calcium-Magnesium-Vitamin D 500-250-125 MG-MG-UNIT TABS Take 1 tablet by mouth daily.   cephALEXin (KEFLEX) 500 MG capsule Take 1 capsule (500 mg total) by mouth 4 (four) times daily.   diclofenac Sodium (VOLTAREN) 1 % GEL diclofenac 1 % topical gel   glimepiride (AMARYL) 2 MG tablet TAKE 1.5 TABLETS (3 MG TOTAL) BY MOUTH DAILY WITH BREAKFAST.   glucose blood (ONE TOUCH ULTRA TEST) test strip USE AS DIRECTED TEST 3 TIMES DAILY E11.9   irbesartan (AVAPRO) 300 MG tablet Take 1 tablet (300 mg total) by mouth daily. For blood pressure.   metFORMIN (GLUCOPHAGE) 500 MG tablet TAKE 1 TABLET BY MOUTH TWICE DAILY WITH FOOD FOR DIABETES. (Patient taking differently: 1,000 mg 2 (two) times daily with a meal. Take 2 tablets by mouth twice daily with food for diabetes.)   metoprolol succinate (TOPROL-XL) 25 MG 24 hr tablet TAKE 1 TABLET BY MOUTH EVERY DAY FOR BLOOD PRESSURE and heart rate.   mupirocin ointment (BACTROBAN) 2 % mupirocin 2 % topical ointment   triamcinolone cream (KENALOG) 0.1 % Apply 1 application topically 2 (two) times daily.   UNABLE TO FIND USE AS DIRECTED 3 TIMES A DAY   Vitamin D, Ergocalciferol, (DRISDOL) 1.25 MG (50000 UNIT) CAPS capsule TAKE 1 CAPSULE BY MOUTH ONE TIME PER WEEK   No facility-administered encounter medications on file as of 08/13/2020.    Current Diagnosis: Patient Active Problem List    Diagnosis Date Noted   On apixaban therapy 04/21/2020   Palpitations 04/21/2020   Acute deep vein thrombosis (DVT) of right tibial vein ([place]) 04/03/2020   Pain due to onychomycosis of toenails of both feet 01/22/2019   Vitamin D deficiency 06/26/2018   Decreased renal function 03/21/2018   Non-traumatic compression fracture of lumbar vertebra with routine healing 08/26/2017   Anxiety and depression 08/26/2017   Type 2 diabetes mellitus ([place]) 04/27/2017   Essential hypertension 04/27/2017   Osteoporosis 04/27/2017   Recent Relevant Labs: Lab Results  Component Value Date/Time   HGBA1C 7.7 (H) 03/31/2020 08:24 AM   HGBA1C 8.6 (H) 10/02/2019 08:34 AM    Kidney Function Lab Results  Component Value Date/Time   CREATININE 0.91 04/13/2020 09:37 PM   CREATININE 0.89 04/12/2020 05:09 AM   GFR 56.99 (L) 03/31/2020 08:24 AM   GFRNONAA 59 (L) 04/13/2020 09:37 PM   GFRAA >60 04/13/2020 09:37 PM   08/12/20 Left message to return call.   Current antihyperglycemic regimen:  Glimepiride 2 mg - 1 and 1/2 tablet once daily with breakfast Metformin 500 mg - 2 tablets (1000 mg) twice daily with food   What recent interventions/DTPs have been made to improve glycemic control:  Continue checking blood sugar  Have there been any recent hospitalizations or ED visits since last visit with CPP? No   Patient denies hypoglycemic symptoms, including Pale, Sweaty, Shaky, Hungry, Nervous/irritable and Vision changes  Patient denies hyperglycemic symptoms, including blurry vision, excessive thirst, fatigue, polyuria and weakness   How often are you checking your blood sugar? States she checks a couple of times a week  What are your blood sugars ranging?  Fasting:  08/14/20 137  08/15/20 102 Before meals: N/A After meals: N/A Bedtime: N/A  During the week, how often does your blood glucose drop below 70? Never   Are you checking your feet daily/regularly? Yes, states feel look ok  Adherence  Review: Is the patient currently on a STATIN medication? No Is the patient currently on ACE/ARB medication? Yes Does the patient have >5 day gap between last estimated fill dates? CPP to review.  Reviewed chart prior to disease state call. Spoke with patient regarding BP  Recent Office Vitals: BP Readings from Last 3 Encounters:  06/20/20 (!) 173/75  05/16/20 (!) 168/60  04/21/20 140/78   Pulse Readings from Last 3 Encounters:  06/20/20 85  05/16/20 89  04/21/20 78    Wt Readings from Last 3 Encounters:  06/20/20 125 lb (56.7 kg)  05/16/20 128 lb (58.1 kg)  04/21/20 129 lb (58.5 kg)     Kidney Function Lab Results  Component Value Date/Time   CREATININE 0.91 04/13/2020 09:37 PM   CREATININE 0.89 04/12/2020 05:09 AM   GFR 56.99 (L) 03/31/2020 08:24 AM   GFRNONAA 59 (L) 04/13/2020 09:37 PM   GFRAA >60 04/13/2020 09:37 PM    BMP Latest Ref Rng & Units 04/13/2020 04/12/2020 03/31/2020  Glucose 70 - 99 mg/dL 188(C) 166(A) 630(Z)  BUN 8 - 23 mg/dL 16 16 20   Creatinine 0.44 - 1.00 mg/dL 6.01 0.93  Sodium 135 - 145 mmol/L 139 136 137  Potassium 3.5 - 5.1 mmol/L 4.1 4.4 4.5  Chloride 98 - 111 mmol/L 106 102 102  CO2 22 - 32 mmol/L 22 22 25   Calcium 8.9 - 10.3 mg/dL 9.4 9.3 9.4    Current antihypertensive regimen:  Amlodipine 10 mg - 1 tablet once daily Irbesartan 300 mg - 1 tablet once daily Metoprolol succinate 25 mg - 1 tablet once daily  Clonidine 0.1 mg  - 1 tablet daily for systolic blood pressure above 2.35  How often are you checking your Blood Pressure? Daily sometimes every other day  Current home BP readings:  08/13/20 - 117/62 08/14/20- 122/68  What recent interventions/DTPs have been made by any provider to improve Blood Pressure control since last CPP Visit:   Continue home BP checks, daily walks and exercise.   Any recent hospitalizations or ED visits since last visit with CPP? No   What diet changes have been made to improve Blood Pressure Control?   Denies any changes  What exercise is being done to improve your Blood Pressure Control?  States she is trying to walk daily.   Adherence Review: Is the patient currently on ACE/ARB medication? Yes Does the patient have >5 day gap between last estimated fill dates? CPP to review  Patient states she is doing very well. She has increased her walking and noticed her blood pressure is improving. She is very happy with her recent readings. She states that she takes 2 metformin in the morning and 2 in the evening. She is still using her same pill box but has "corrected" how she was distributing the medications.   Patient requested prices on medications at [facility] compared to [facility]. Provided patient price verification on 90 day supply from [facility] on the following medications: Irbesartan: $15.00 Metoprolol: $15.00 Amlodipine: $  14.93  Follow-Up:  Medication Cost Review and Pharmacist Review   [name], CPP notified  Margaretmary Dys, [place] Assistant [phone]  I have reviewed the care management and care coordination activities outlined in this encounter and I am certifying that I agree with the content of this note. No further action required.  [name], PharmD Clinical Pharmacist [facility] at [facility] [phone]

## 2020-08-26 ENCOUNTER — Other Ambulatory Visit: Payer: Self-pay | Admitting: Primary Care

## 2020-08-26 DIAGNOSIS — E119 Type 2 diabetes mellitus without complications: Secondary | ICD-10-CM

## 2020-08-26 DIAGNOSIS — I10 Essential (primary) hypertension: Secondary | ICD-10-CM

## 2020-08-26 DIAGNOSIS — M810 Age-related osteoporosis without current pathological fracture: Secondary | ICD-10-CM

## 2020-08-28 NOTE — Telephone Encounter (Signed)
Ok to refill the vit d

## 2020-08-28 NOTE — Telephone Encounter (Signed)
Refills sent to pharmacy. 

## 2020-09-01 ENCOUNTER — Other Ambulatory Visit: Payer: Self-pay | Admitting: Primary Care

## 2020-09-01 DIAGNOSIS — I10 Essential (primary) hypertension: Secondary | ICD-10-CM

## 2020-10-06 ENCOUNTER — Ambulatory Visit: Payer: Medicare Other | Admitting: Primary Care

## 2020-10-07 ENCOUNTER — Encounter: Payer: Self-pay | Admitting: Primary Care

## 2020-10-07 ENCOUNTER — Ambulatory Visit (INDEPENDENT_AMBULATORY_CARE_PROVIDER_SITE_OTHER): Payer: Medicare Other | Admitting: Primary Care

## 2020-10-07 ENCOUNTER — Other Ambulatory Visit: Payer: Self-pay

## 2020-10-07 VITALS — BP 146/60 | HR 77 | Temp 96.8°F | Ht 65.0 in | Wt 131.4 lb

## 2020-10-07 DIAGNOSIS — E119 Type 2 diabetes mellitus without complications: Secondary | ICD-10-CM | POA: Diagnosis not present

## 2020-10-07 LAB — POCT GLYCOSYLATED HEMOGLOBIN (HGB A1C): Hemoglobin A1C: 8 % — AB (ref 4.0–5.6)

## 2020-10-07 LAB — HM DIABETES EYE EXAM

## 2020-10-07 MED ORDER — GLIMEPIRIDE 2 MG PO TABS: 2.0000 mg | ORAL_TABLET | Freq: Two times a day (BID) | ORAL | 3 refills | Status: DC

## 2020-10-07 MED ORDER — METFORMIN HCL 1000 MG PO TABS: 1000.0000 mg | ORAL_TABLET | Freq: Two times a day (BID) | ORAL | 3 refills | Status: DC

## 2020-10-07 NOTE — Assessment & Plan Note (Signed)
Uncontrolled with A1C today of 8.0, discussed our goal of <8.0.   She has been up and down with her A1c over the years, never consistently below 8.  Continue with Metformin 1000 mg twice daily. Increase glimepiride to 2 mg twice daily.  She will work on her diet, she will also continue with daily walks. Repeat A1c in 3 months, follow-up in 6 months.

## 2020-10-07 NOTE — Progress Notes (Signed)
Subjective:    Patient ID: Morgan Bowen, female    DOB: 11/11/1935, 85 y.o.   MRN: [id number]  HPI  This visit occurred during the SARS-CoV-2 public health emergency.  Safety protocols were in place, including screening questions prior to the visit, additional usage of staff PPE, and extensive cleaning of exam room while observing appropriate contact time as indicated for disinfecting solutions.   Morgan Bowen is a 85 year old female with a history of hypertension, DVT, type 2 diabetes, anxiety and depression who presents today for follow-up of diabetes.  Current medications include: Glimepiride 3 mg daily, Metformin 1000 mg twice daily.   She is checking her blood glucose on occasion and sees readings of low 100's to mid 100's. Sometimes will see a reading in the 300's.   Last A1C: 7.15 March 2020 Last Eye Exam: Due and scheduled for April  Last Foot Exam: UTD Pneumonia Vaccination: 2019 ACE/ARB: Irbesartan Statin: None, has declined  She admits to a poor diet over the last 3 months, but has started walking every day.  BP Readings from Last 3 Encounters:  10/07/20 (!) 146/60  06/20/20 (!) 173/75  05/16/20 (!) 168/60     Review of Systems  Respiratory: Negative for shortness of breath.   Cardiovascular: Negative for chest pain.  Neurological: Negative for dizziness and numbness.       Past Medical History:  Diagnosis Date  . Acute cystitis 05/23/2018  . Anxiety   . Arthritis   . Cellulitis 12/13/2018  . Chickenpox   . Diabetes mellitus without complication ([place])   . GERD (gastroesophageal reflux disease)   . Hypertension   . Osteoporosis   . Type 2 diabetes mellitus ([place])   . Urinary tract infection      Social History   Socioeconomic History  . Marital status: Widowed    Spouse name: Not on file  . Number of children: Not on file  . Years of education: Not on file  . Highest education level: Not on file  Occupational History  . Not on file  Tobacco Use  .  Smoking status: Never Smoker  . Smokeless tobacco: Never Used  Vaping Use  . Vaping Use: Never used  Substance and Sexual Activity  . Alcohol use: No  . Drug use: No  . Sexual activity: Not Currently  Other Topics Concern  . Not on file  Social History Narrative   ** Merged History Encounter **       Widowed. Moved from [place]. 1 son, 3 grandchildren. Retired. Once worked as a Agricultural engineer.    Social Determinants of Health   Financial Resource Strain: Low Risk   . Difficulty of Paying Living Expenses: Not hard at all  Food Insecurity: No Food Insecurity  . Worried About Charity fundraiser in the Last Year: Never true  . Ran Out of Food in the Last Year: Never true  Transportation Needs: No Transportation Needs  . Lack of Transportation (Medical): No  . Lack of Transportation (Non-Medical): No  Physical Activity: Inactive  . Days of Exercise per Week: 0 days  . Minutes of Exercise per Session: 0 min  Stress: No Stress Concern Present  . Feeling of Stress : Not at all  Social Connections: Not on file  Intimate Partner Violence: Not At Risk  . Fear of Current or Ex-Partner: No  . Emotionally Abused: No  . Physically Abused: No  . Sexually Abused: No    Past Surgical History:  Procedure  Laterality Date  . ABDOMINAL HYSTERECTOMY  1984  . ABDOMINAL HYSTERECTOMY    . APPENDECTOMY  1958  . CHOLECYSTECTOMY    . GALLBLADDER SURGERY    . MANDIBLE FRACTURE SURGERY  07/13/2016  . TONSILLECTOMY  1958  . TONSILLECTOMY    . WRIST FRACTURE SURGERY Right    2000    Family History  Problem Relation Age of Onset  . Hypertension Mother   . Stroke Mother   . Stroke Father   . Hypertension Father   . Hypertension Sister   . Kidney disease Sister   . Breast cancer Neg Hx     Allergies  Allergen Reactions  . Sulfamethoxazole-Trimethoprim   . Penicillins Rash    Current Outpatient Medications on File Prior to Visit  Medication Sig Dispense Refill  . amLODipine  (NORVASC) 10 MG tablet TAKE 1 TABLET BY MOUTH EVERY DAY FOR BLOOD PRESSURE 90 tablet 1  . Calcium-Magnesium-Vitamin D 299-371-696 MG-MG-UNIT TABS Take 1 tablet by mouth daily.    Marland Kitchen glucose blood (ONE TOUCH ULTRA TEST) test strip USE AS DIRECTED TEST 3 TIMES DAILY E11.9 100 each 2  . irbesartan (AVAPRO) 300 MG tablet Take 1 tablet (300 mg total) by mouth daily. For blood pressure. 90 tablet 1  . metoprolol succinate (TOPROL-XL) 25 MG 24 hr tablet TAKE 1 TABLET BY MOUTH EVERY DAY FOR BLOOD PRESSURE 90 tablet 1  . UNABLE TO FIND USE AS DIRECTED 3 TIMES A DAY    . Vitamin D, Ergocalciferol, (DRISDOL) 1.25 MG (50000 UNIT) CAPS capsule TAKE 1 CAPSULE BY MOUTH ONE TIME PER WEEK 12 capsule 0   No current facility-administered medications on file prior to visit.    BP (!) 146/60   Pulse 77   Temp (!) 96.8 F (36 C) (Temporal)   Ht 5\' 5"  (1.651 m)   Wt 131 lb 6.4 oz (59.6 kg)   SpO2 98%   BMI 21.87 kg/m    Objective:   Physical Exam Constitutional:      Appearance: She is well-nourished.  Cardiovascular:     Rate and Rhythm: Normal rate and regular rhythm.  Pulmonary:     Effort: Pulmonary effort is normal.     Breath sounds: Normal breath sounds.  Musculoskeletal:     Cervical back: Neck supple.  Skin:    General: Skin is warm and dry.  Psychiatric:        Mood and Affect: Mood and affect normal.            Assessment & Plan:

## 2020-10-07 NOTE — Patient Instructions (Addendum)
We changed your metformin to the 1000 mg tablet, take this twice daily for diabetes.  We increased the dose of your glimepiride 2 mg, take this twice daily for diabetes.   Continue walking everyday!  Schedule a lab appointment for 3 months for A1c check. Schedule your physical with me and your wellness visit with the nurse in 6 months.  It was a pleasure to see you today!

## 2020-10-08 ENCOUNTER — Telehealth: Payer: Self-pay | Admitting: Primary Care

## 2020-10-08 NOTE — Telephone Encounter (Signed)
Sorry not sure what this message is saying. Did they want [place] to give script for meter or something? /

## 2020-10-08 NOTE — Telephone Encounter (Signed)
[facility] wantd to know if she was approved for the [employer] and supplies

## 2020-10-09 NOTE — Telephone Encounter (Signed)
He stated that they sent an order for the [facility] and wanted to know if she was approved after seeing Anda Kraft

## 2020-10-10 NOTE — Telephone Encounter (Signed)
I don't think that she needs the [facility], is there a reason why she's wanting one? If she really wants 1, then okay to send in.  I am not sure if insurance will cover or not.

## 2020-10-10 NOTE — Telephone Encounter (Signed)
Please advise 

## 2020-10-13 NOTE — Telephone Encounter (Signed)
Noted will keep eye out for PPW.

## 2020-10-13 NOTE — Telephone Encounter (Signed)
Fatima Sanger called again and stated that she called them and asked about getting the machine and she had met their critia and faxed over the form.

## 2020-10-13 NOTE — Telephone Encounter (Signed)
ppw received and faxed with last office note for review.

## 2020-10-17 ENCOUNTER — Telehealth: Payer: Medicare Other

## 2020-10-24 ENCOUNTER — Telehealth: Payer: Self-pay

## 2020-10-24 ENCOUNTER — Other Ambulatory Visit: Payer: Self-pay

## 2020-10-24 ENCOUNTER — Ambulatory Visit (INDEPENDENT_AMBULATORY_CARE_PROVIDER_SITE_OTHER): Payer: Medicare Other

## 2020-10-24 DIAGNOSIS — I10 Essential (primary) hypertension: Secondary | ICD-10-CM

## 2020-10-24 DIAGNOSIS — E119 Type 2 diabetes mellitus without complications: Secondary | ICD-10-CM | POA: Diagnosis not present

## 2020-10-24 DIAGNOSIS — M810 Age-related osteoporosis without current pathological fracture: Secondary | ICD-10-CM

## 2020-10-24 MED ORDER — CLONIDINE HCL 0.1 MG PO TABS: ORAL_TABLET | ORAL | 0 refills | Status: DC

## 2020-10-24 MED ORDER — VITAMIN D (ERGOCALCIFEROL) 1.25 MG (50000 UNIT) PO CAPS: ORAL_CAPSULE | ORAL | 0 refills | Status: DC

## 2020-10-24 NOTE — Patient Instructions (Signed)
Dear Morgan Bowen,  Below is a summary of the goals we discussed during our follow up appointment on October 24, 2020. Please contact me anytime with questions or concerns.   Visit Information   Patient Care Plan: CCM Pharmacy Care Plan    Problem Identified: CHL AMB "PATIENT-SPECIFIC PROBLEM"     Long-Range Goal: Disease Management   Start Date: 10/24/2020  Priority: High  Note:   Current Barriers:  . None identified  Pharmacist Clinical Goal(s):  Marland Kitchen Patient will achieve control of diabetes as evidenced by A1c < 8% through collaboration with PharmD and provider.   Interventions: . 1:1 collaboration with [name], NP regarding development and update of comprehensive plan of care as evidenced by provider attestation and co-signature . Inter-disciplinary care team collaboration (see longitudinal plan of care) . Comprehensive medication review performed; medication list updated in electronic medical record  Hypertension (BP goal <140/90) -Controlled - home readings always within goal despite elevations in clinic -Current treatment: . Irbesartan 300 mg - 1 tablet daily . Metoprolol succinate 25 mg - 1 tablet daily  . Amlodipine 10 mg - 1 tablet daily  . Clonidine - patient has never taken; requesting refill for BP > 170 just in case -Medications previously tried: none   -Current home readings: usually 130s/60-70s -Current exercise habits: walking daily  -Denies hypotensive/hypertensive symptoms -Educated on Symptoms of hypotension and importance of maintaining adequate hydration; -Counseled to monitor BP at home weekly, document, and provide log at future appointments -Recommended to continue current medication  Diabetes (A1c goal <8%) -Controlled -Current medications: . Glimepiride 2 mg - 2 tablets with lunch (per patient report) . Metformin 1000 mg - 1 tablet twice daily with breakfast and supper (per patient report) -Medications previously tried: none   -Current  home glucose readings - checking daily  . fasting glucose: 88, 87, 86, 85, 84, 83, 82 . post prandial glucose: none -Denies hypoglycemic/hyperglycemic symptoms -She denies any BG < 70 -Reviewed management of hypoglycemic episodes -Counseled to check feet daily and get yearly eye exams - eye exam scheduled for April -Recommended to continue current medication  Patient Goals/Self-Care Activities . Patient will:  - check glucose daily, document, and provide at future appointments  - check blood pressure weekly or with symptoms   Follow Up Plan: Telephone follow up appointment with care management team member scheduled for: May 26, 2021 at 8:30 AM       The patient verbalized understanding of instructions, educational materials, and care plan provided today and agreed to receive a mailed copy of patient instructions, educational materials, and care plan.   Verbal consent obtained for [facility] enhanced pharmacy services (medication synchronization, adherence packaging, delivery coordination). A medication sync plan was created to allow patient to get all medications delivered once every 30 to 90 days per patient preference. Patient understands they have freedom to choose pharmacy and clinical pharmacist will coordinate care between all prescribers and [facility].  Patient to call Sharyn Lull with any pharmacy refill needs.  [name], PharmD Clinical Pharmacist [facility] at [facility] [phone]

## 2020-10-24 NOTE — Progress Notes (Signed)
Chronic Care Management Pharmacy Note  10/24/2020 Name:  Morgan Bowen MRN:  [id number] DOB:  07/14/1936  Subjective: Morgan Bowen is an 85 y.o. year old female who is a primary patient of [name], NP.  The CCM team was consulted for assistance with disease management and care coordination needs.    Engaged with patient by telephone for follow up visit in response to provider referral for pharmacy case management and/or care coordination services.   Consent to Services:  The patient was given information about Chronic Care Management services, agreed to services, and gave verbal consent prior to initiation of services.  Please see initial visit note for detailed documentation.   Patient Care Team: [name], NP as PCP - General (Internal Medicine) [name], NP (Internal Medicine) [name], [facility] as Pharmacist (Pharmacist)  Recent office visits: 10/07/20 - PCP - A1c goal < 8%, continue metformin 1000 mg BID, increase glimepiride to 2 mg twice daily.   Recent consult visits: None recent  Hospital visits: None in previous 6 months  Objective:  Lab Results  Component Value Date   CREATININE 0.91 04/13/2020   BUN 16 04/13/2020   GFR 56.99 (L) 03/31/2020   GFRNONAA 59 (L) 04/13/2020   GFRAA >60 04/13/2020   NA 139 04/13/2020   K 4.1 04/13/2020   CALCIUM 9.4 04/13/2020   CO2 22 04/13/2020   GLUCOSE 268 (H) 04/13/2020    Lab Results  Component Value Date/Time   HGBA1C 8.0 (A) 10/07/2020 10:52 AM   HGBA1C 7.7 (H) 03/31/2020 08:24 AM   HGBA1C 8.6 (H) 10/02/2019 08:34 AM   GFR 56.99 (L) 03/31/2020 08:24 AM   GFR 57.06 (L) 10/02/2019 08:34 AM    Last diabetic Eye exam:  Lab Results  Component Value Date/Time   HMDIABEYEEXA No Retinopathy 10/07/2020 12:00 AM    Last diabetic Foot exam: 01/17/20   Lab Results  Component Value Date   CHOL 174 03/31/2020   HDL 42.90 03/31/2020   LDLCALC 96 03/31/2020   TRIG 172.0 (H) 03/31/2020   CHOLHDL  4 03/31/2020    Hepatic Function Latest Ref Rng & Units 03/31/2020 06/02/2019 03/27/2019  Total Protein 6.0 - 8.3 g/dL 7.7 8.7(H) 7.7  Albumin 3.5 - 5.2 g/dL 4.2 4.4 4.1  AST 0 - 37 U/L 17 22 17   ALT 0 - 35 U/L 16 19 16   Alk Phosphatase 39 - 117 U/L 64 81 66  Total Bilirubin 0.2 - 1.2 mg/dL 0.6 0.5 0.6    Lab Results  Component Value Date/Time   TSH 3.40 04/21/2020 12:08 PM    CBC Latest Ref Rng & Units 04/13/2020 04/12/2020 06/02/2019  WBC 4.0 - 10.5 K/uL 8.8 9.0 9.5  Hemoglobin 12.0 - 15.0 g/dL 12.0 12.5 13.0  Hematocrit 36.0 - 46.0 % 34.9(L) 36.6 38.2  Platelets 150 - 400 K/uL 255 255 288    Lab Results  Component Value Date/Time   VD25OH 67.95 03/31/2020 08:24 AM   VD25OH 61.67 03/27/2019 08:17 AM   Clinical ASCVD: No  The ASCVD Risk score Mikey Bussing DC Jr., et al., 2013) failed to calculate for the following reasons:   The 2013 ASCVD risk score is only valid for ages 62 to 69    Depression screen PHQ 2/9 04/01/2020 05/15/2019 03/27/2019  Decreased Interest 0 0 0  Down, Depressed, Hopeless 0 0 0  PHQ - 2 Score 0 0 0  Altered sleeping 0 1 0  Tired, decreased energy 0 0 0  Change in appetite 0 0 0  Feeling bad or failure about yourself  0 0 0  Trouble concentrating 0 0 0  Moving slowly or fidgety/restless 0 1 0  Suicidal thoughts 0 0 0  PHQ-9 Score 0 2 0  Difficult doing work/chores Not difficult at all Not difficult at all -    Social History   Tobacco Use  Smoking Status Never Smoker  Smokeless Tobacco Never Used   BP Readings from Last 3 Encounters:  10/07/20 (!) 146/60  06/20/20 (!) 173/75  05/16/20 (!) 168/60   Pulse Readings from Last 3 Encounters:  10/07/20 77  06/20/20 85  05/16/20 89   Wt Readings from Last 3 Encounters:  10/07/20 131 lb 6.4 oz (59.6 kg)  06/20/20 125 lb (56.7 kg)  05/16/20 128 lb (58.1 kg)   BMI Readings from Last 3 Encounters:  10/07/20 21.87 kg/m  06/20/20 20.80 kg/m  05/16/20 21.30 kg/m    Assessment/Interventions: Review  of patient past medical history, allergies, medications, health status, including review of consultants reports, laboratory and other test data, was performed as part of comprehensive evaluation and provision of chronic care management services.   SDOH:  (Social Determinants of Health) assessments and interventions performed: Yes SDOH Interventions   Flowsheet Row Most Recent Value  SDOH Interventions   Financial Strain Interventions Intervention Not Indicated      CCM Care Plan  Allergies  Allergen Reactions   Sulfamethoxazole-Trimethoprim    Penicillins Rash    Medications Reviewed Today    Reviewed by [name], [facility] (Pharmacist) on 10/24/20 at 1054  Med List Status: <None>  Medication Order Taking? Sig Documenting Provider Last Dose Status Informant  amLODipine (NORVASC) 10 MG tablet [id number] Yes TAKE 1 TABLET BY MOUTH EVERY DAY FOR BLOOD PRESSURE [name], NP Taking Active   Calcium-Magnesium-Vitamin D [phone] MG-MG-UNIT TABS [id number] Yes Take 1 tablet by mouth daily. [provider] Taking Active   glimepiride (AMARYL) 2 MG tablet [id number] Yes Take 1 tablet (2 mg total) by mouth 2 (two) times daily with a meal. [name], NP Taking Active   glucose blood (ONE TOUCH ULTRA TEST) test strip [id number] Yes USE AS DIRECTED TEST 3 TIMES DAILY E11.9 [name], NP Taking Active   irbesartan (AVAPRO) 300 MG tablet [id number] Yes Take 1 tablet (300 mg total) by mouth daily. For blood pressure. [name], NP Taking Active   metFORMIN (GLUCOPHAGE) 1000 MG tablet [id number] Yes Take 1 tablet (1,000 mg total) by mouth 2 (two) times daily with a meal. For diabetes. [name], NP Taking Active   metoprolol succinate (TOPROL-XL) 25 MG 24 hr tablet [id number] Yes TAKE 1 TABLET BY MOUTH EVERY DAY FOR BLOOD PRESSURE [name], NP Taking Active   UNABLE TO FIND [id number] Yes USE AS DIRECTED 3 TIMES A DAY [provider]  Taking Active   Vitamin D, Ergocalciferol, (DRISDOL) 1.25 MG (50000 UNIT) CAPS capsule [id number] Yes TAKE 1 CAPSULE BY MOUTH ONE TIME PER WEEK [name], NP Taking Active           Patient Active Problem List   Diagnosis Date Noted   On apixaban therapy 04/21/2020   Palpitations 04/21/2020   Acute deep vein thrombosis (DVT) of right tibial vein ([place]) 04/03/2020   Pain due to onychomycosis of toenails of both feet 01/22/2019   Vitamin D deficiency 06/26/2018   Decreased renal function 03/21/2018   Non-traumatic compression fracture of lumbar vertebra with routine  healing 08/26/2017   Anxiety and depression 08/26/2017   Type 2 diabetes mellitus ([place]) 04/27/2017   Essential hypertension 04/27/2017   Osteoporosis 04/27/2017    Immunization History  Administered Date(s) Administered   Influenza, High Dose Seasonal PF 05/09/2012   Influenza,inj,Quad PF,6+ Mos 03/30/2019   PFIZER(Purple Top)SARS-COV-2 Vaccination 04/08/2020, 04/29/2020   Pneumococcal Conjugate-13 10/24/2017   Pneumococcal Polysaccharide-23 08/23/2013   Tdap 07/14/2015   Zoster 08/06/2013, 12/08/2014    Conditions to be addressed/monitored:  Hypertension and Diabetes  Care Plan : [place]  Updates made by [name], [place] since 10/24/2020 12:00 AM    Problem: CHL AMB "PATIENT-SPECIFIC PROBLEM"     Long-Range Goal: Disease Management   Start Date: 10/24/2020  Priority: High  Note:   Current Barriers:   None identified  Pharmacist Clinical Goal(s):   Patient will achieve control of diabetes as evidenced by A1c < 8% through collaboration with PharmD and provider.   Interventions:  1:1 collaboration with [name], NP regarding development and update of comprehensive plan of care as evidenced by provider attestation and co-signature  Inter-disciplinary care team collaboration (see longitudinal plan of care)  Comprehensive medication review performed;  medication list updated in electronic medical record  Hypertension (BP goal <140/90) -Controlled - home readings always within goal despite elevations in clinic -Current treatment:  Irbesartan 300 mg - 1 tablet daily  Metoprolol succinate 25 mg - 1 tablet daily   Amlodipine 10 mg - 1 tablet daily   Clonidine - patient has never taken; requesting refill for BP > 170 just in case -Medications previously tried: none   -Current home readings: usually 130s/60-70s -Current exercise habits: walking daily  -Denies hypotensive/hypertensive symptoms -Educated on Symptoms of hypotension and importance of maintaining adequate hydration; -Counseled to monitor BP at home weekly, document, and provide log at future appointments -Recommended to continue current medication  Diabetes (A1c goal <8%) -Controlled -Current medications:  Glimepiride 2 mg - 2 tablets with lunch (per patient report)  Metformin 1000 mg - 1 tablet twice daily with breakfast and supper (per patient report) -Medications previously tried: none   -Current home glucose readings - checking daily   fasting glucose: 88, 87, 86, 85, 84, 83, 82  post prandial glucose: none -Denies hypoglycemic/hyperglycemic symptoms -She denies any BG < 70 -Reviewed management of hypoglycemic episodes -Counseled to check feet daily and get yearly eye exams - eye exam scheduled for April -Recommended to continue current medication  Patient Goals/Self-Care Activities  Patient will:  - check glucose daily, document, and provide at future appointments  - check blood pressure weekly or with symptoms   Follow Up Plan: Telephone follow up appointment with care management team member scheduled for: May 26, 2021 at 8:30 AM      Medication Assistance: None required.  Patient affirms current coverage meets needs.  [id number] [address] [place] [id number]: [phone]: [phone] [facility] Uses pill box? Yes   We discussed: Benefits of medication sync and delivery  Verbal consent obtained for [facility] enhanced pharmacy services (medication synchronization, adherence packaging, delivery coordination). A medication sync plan was created to allow patient to get all medications delivered once every 30 to 90 days per patient preference. Patient understands they have freedom to choose pharmacy and clinical pharmacist will coordinate care between all prescribers and [facility].  Reviewed medication routine:  Amlodipine 10 mg -  1 daily at breakfast  Calcium/vitamin D - 1 daily at breakfast (will continue to purchase online)   Glimepiride 2 mg - 2 daily at lunch (will need early refill due to dose change)  Irbesartan 300 mg 1 daily at bedtime  Metformin 1000 mg - 1 daily at breakfast and supper  Metoprolol succinate ER 25 mg - 1 daily at bedtime  Vitamin D2 50,0000 IU - 1 tablet every Friday at breakfast (2 on hand - needs refill)  Discussed sync plan with patient.   Care Plan and Follow Up Patient Decision:  Patient agrees to Care Plan and Follow-up.  [name], PharmD Clinical Pharmacist [facility] at [facility] [phone]

## 2020-10-24 NOTE — Chronic Care Management (AMB) (Signed)
Following appointment with [name] on 10/24/20 patient requested to onboard with [facility]. Medication count was completed at initial visit and onboarding form was completed. [facility] for delivery, med sync, and adherence packaging from [facility].  Follow-Up:  Coordination of Enhanced Pharmacy Services and Pharmacist Review  [name], CPP notified  Margaretmary Dys, [place] [phone]  Total time spent for month: 40

## 2020-10-24 NOTE — Telephone Encounter (Signed)
Noted, prescription sent to pharmacy. 

## 2020-10-24 NOTE — Telephone Encounter (Signed)
Patient would like a refill on Vitamin D2 50,000 IU and clonidine 0.1 mg - TID PRN SBP > 170 (not active on med list). She reports PCP wanted to keep clonidine on hand in case. Her home BP has been around 130/70s so she has never taken it. She is switching her pharmacy to [facility] for delivery and med sync.  [name], PharmD Clinical Pharmacist [facility] at [facility] [phone]

## 2020-11-18 LAB — HM DIABETES EYE EXAM

## 2020-11-20 ENCOUNTER — Other Ambulatory Visit: Payer: Self-pay

## 2020-11-20 ENCOUNTER — Telehealth: Payer: Self-pay

## 2020-11-20 DIAGNOSIS — M79671 Pain in right foot: Secondary | ICD-10-CM | POA: Diagnosis not present

## 2020-11-20 DIAGNOSIS — I10 Essential (primary) hypertension: Secondary | ICD-10-CM

## 2020-11-20 MED ORDER — AMLODIPINE BESYLATE 10 MG PO TABS: ORAL_TABLET | ORAL | 1 refills | Status: DC

## 2020-11-20 NOTE — Telephone Encounter (Signed)
Pt needs a refill on amlodipine to [facility] for pill packaging. [facility] although 90 DS + 1 refill prescribed 08/28/20.  Thank you,   [name], PharmD  Clinical Pharmacist  [facility] at [facility]  [phone]

## 2020-11-20 NOTE — Telephone Encounter (Signed)
Refill sent in

## 2020-11-24 ENCOUNTER — Telehealth: Payer: Self-pay

## 2020-11-24 NOTE — Telephone Encounter (Signed)
Fax received from DME (Continous glucose monitor) requesting last office visit notes. Have faxed to number provided from [facility].

## 2020-12-16 ENCOUNTER — Telehealth: Payer: Self-pay

## 2020-12-16 NOTE — Chronic Care Management (AMB) (Signed)
ENTERED IN ERROR

## 2020-12-18 ENCOUNTER — Encounter: Payer: Self-pay | Admitting: Podiatry

## 2020-12-18 ENCOUNTER — Other Ambulatory Visit: Payer: Self-pay

## 2020-12-18 ENCOUNTER — Ambulatory Visit (INDEPENDENT_AMBULATORY_CARE_PROVIDER_SITE_OTHER): Payer: Medicare Other | Admitting: Podiatry

## 2020-12-18 DIAGNOSIS — M79674 Pain in right toe(s): Secondary | ICD-10-CM | POA: Diagnosis not present

## 2020-12-18 DIAGNOSIS — M79675 Pain in left toe(s): Secondary | ICD-10-CM

## 2020-12-18 DIAGNOSIS — E119 Type 2 diabetes mellitus without complications: Secondary | ICD-10-CM

## 2020-12-18 DIAGNOSIS — D689 Coagulation defect, unspecified: Secondary | ICD-10-CM

## 2020-12-18 DIAGNOSIS — Z7901 Long term (current) use of anticoagulants: Secondary | ICD-10-CM

## 2020-12-18 DIAGNOSIS — B351 Tinea unguium: Secondary | ICD-10-CM

## 2020-12-18 NOTE — Progress Notes (Signed)
Complaint:  Visit Type: This patient presents to the office for at risk foot care patient requires this care by professional since  this patient will be at risk  due to having diabetes.  Patient is unable to cut her own toenails since she cannot reach down.  She presents for at risk foot care today.    Podiatric Exam: Vascular: dorsalis pedis and posterior tibial pulses are palpable bilateral. Capillary return is immediate. Temperature gradient is WNL. Skin turgor WNL  Sensorium: Normal Semmes Weinstein monofilament test. Normal tactile sensation bilaterally. Nail Exam: Pt has thick disfigured discolored nails with subungual debris noted bilateral entire nail hallux.  Nails are thick disfigured and discolored hallux toenails. Ulcer Exam: There is no evidence of ulcer or pre-ulcerative changes or infection. Orthopedic Exam: Muscle tone and strength are WNL. No limitations in general ROM. No crepitus or effusions noted. Foot type and digits show no abnormalities. Bony prominences are unremarkable. Skin: No Porokeratosis. No infection or ulcers  Diagnosis:  Onychomycosis, , Pain in right toe, pain in left toes  Midfoot arthritis.  Treatment & Plan Procedures and Treatment: Consent by patient was obtained for treatment procedures.   Debridement of mycotic and hypertrophic toenails, 1 through 5 bilateral and clearing of subungual debris. No ulceration, no infection noted.  Told the patient to return for periodic foot evaluation to reduce potential  at risk. Complications Return Visit-Office Procedure: Patient instructed to return to the office for a follow up visit 3 months for continued evaluation and treatment.    Gardiner Barefoot DPM

## 2020-12-23 ENCOUNTER — Other Ambulatory Visit: Payer: Self-pay

## 2020-12-23 DIAGNOSIS — M810 Age-related osteoporosis without current pathological fracture: Secondary | ICD-10-CM

## 2020-12-23 NOTE — Telephone Encounter (Signed)
Which pharmacy? Yes, okay to continue. I have the Rx pended, okay to send to whatever pharmacy she is needing.

## 2020-12-23 NOTE — Telephone Encounter (Signed)
Received fax for refill on vit D. Did she need to continue on 50,000 or go on otc

## 2020-12-24 ENCOUNTER — Telehealth: Payer: Self-pay

## 2020-12-24 NOTE — Chronic Care Management (AMB) (Addendum)
Chronic Care Management Pharmacy Assistant   Name: Morgan Bowen  MRN: [id number] DOB: 03-04-1936  Reason for Encounter: Medication Adherence and Delivery Coordination   Recent office visits:  None since last CCM Contact  Recent consult visits:  12/18/20- Dr. Gardiner Barefoot- Podiatry. No Changes. Follow up in 3 months.  11/20/20- [place]. Right foot pain. No additional data available.   Hospital visits:  None in previous 6 months  Medications: Outpatient Encounter Medications as of 12/24/2020  Medication Sig   amLODipine (NORVASC) 10 MG tablet One tablet by mouth every day for blood pressure.   Calcium-Magnesium-Vitamin D [phone] MG-MG-UNIT TABS Take 1 tablet by mouth daily.   cloNIDine (CATAPRES) 0.1 MG tablet Take 1 tablet by mouth once daily as needed for systolic blood pressure greater than 170.   glimepiride (AMARYL) 2 MG tablet Take 1 tablet (2 mg total) by mouth 2 (two) times daily with a meal.   glucose blood (ONE TOUCH ULTRA TEST) test strip USE AS DIRECTED TEST 3 TIMES DAILY E11.9   irbesartan (AVAPRO) 300 MG tablet Take 1 tablet (300 mg total) by mouth daily. For blood pressure.   metFORMIN (GLUCOPHAGE) 1000 MG tablet Take 1 tablet (1,000 mg total) by mouth 2 (two) times daily with a meal. For diabetes.   metoprolol succinate (TOPROL-XL) 25 MG 24 hr tablet TAKE 1 TABLET BY MOUTH EVERY DAY FOR BLOOD PRESSURE   UNABLE TO FIND USE AS DIRECTED 3 TIMES A DAY   Vitamin D, Ergocalciferol, (DRISDOL) 1.25 MG (50000 UNIT) CAPS capsule TAKE 1 CAPSULE BY MOUTH ONE TIME PER WEEK for low vitamin D.   No facility-administered encounter medications on file as of 12/24/2020.   BP Readings from Last 3 Encounters:  10/07/20 (!) 146/60  06/20/20 (!) 173/75  05/16/20 (!) 168/60    Lab Results  Component Value Date   HGBA1C 8.0 (A) 10/07/2020    Recent OV, Consult or Hospital visit:   12/18/20- Dr. Gardiner Barefoot- Podiatry. No Changes. Follow up in 3 months.  11/20/20-  [place]. Right foot pain. No additional data available No medication changes indicated  Patient obtains medications through Vials  90 Days   Patient is due for first adherence delivery on: 12/31/20  Spoke with patient on 12/28/20 and reviewed medications and coordinated delivery.  This delivery to include: Vials  90 Days Non Safety Caps Vitamin D2 1250 mcg (50,000 units) - 1 tablet every Friday (breakfast) Glimepiride 2mg  - 2 tablets daily (2- lunch) Amlodipine 10mg -  1 tablet daily (breakfast) Irbesartan 300mg - 1 tablet daily (bedtime) Metoprolol 25mg - 1 tablet daily (bedtime) Metformin 1000mg  1 tablet twice daily (1 breakfast, 1 evening meal)  PRN Clonidine 0.1mg  - 1 tablet once daily as needed for systolic blood pressure greater than 170  Patient declined the following medications this month: No medications declined.   Refills requested from PCP include:  Vitamin D2 Irbesartan 300 mg Metoprolol 25 mg ER Requested 12/24/20 from [facility] to fulfill patients 90 day fill request.   Confirmed delivery date of 12/31/20, advised patient that pharmacy will contact her the morning of delivery.  Recent blood pressure readings are as follows: 153/61, 77 159/69, 84 143/67, 82 137/64, 79 153/70, 71 156/63, 86 151/66, 71  Recent blood glucose readings are as follows: Fasting: 79, 89, 108, 119, 112, 114, 126, 124, 107  Before Meals:N/A  After Meals: 270  Bedtime: 130   Follow-Up:  Comptroller and Pharmacist Review  [employer]  Andree Elk, CPP notified  Margaretmary Dys, [place] [facility] Assistant [phone]  I have reviewed the care management and care coordination activities outlined in this encounter and I am certifying that I agree with the content of this note. No further action required.  [name], PharmD Clinical Pharmacist [place] [facility] at [facility] [phone]

## 2020-12-25 MED ORDER — VITAMIN D (ERGOCALCIFEROL) 1.25 MG (50000 UNIT) PO CAPS: ORAL_CAPSULE | ORAL | 0 refills | Status: DC

## 2020-12-25 NOTE — Telephone Encounter (Signed)
Called patient she wants sent to  [facility] sent in refill as pended.

## 2020-12-29 ENCOUNTER — Telehealth: Payer: Self-pay

## 2020-12-29 DIAGNOSIS — I10 Essential (primary) hypertension: Secondary | ICD-10-CM

## 2020-12-29 NOTE — Telephone Encounter (Signed)
-----   Message from [place] sent at 12/24/2020  3:40 PM EDT ----- Regarding: Refill Patient needs refills on the following medications: Vitamin 2 from  Irbesartan 300 mg Metoprolol 25 mg ER  She has about a month left on most of the medications but she is requesting 90 day fill. If appropriate please send refill to [facility].  Thank you,  Margaretmary Dys, [place] [facility] Assistant [phone]

## 2020-12-30 MED ORDER — IRBESARTAN 300 MG PO TABS: 300.0000 mg | ORAL_TABLET | Freq: Every day | ORAL | 0 refills | Status: DC

## 2020-12-30 MED ORDER — METOPROLOL SUCCINATE ER 25 MG PO TB24
ORAL_TABLET | ORAL | 0 refills | Status: DC
Start: 2020-12-30 — End: 2021-03-30

## 2020-12-30 NOTE — Telephone Encounter (Signed)
Refills sent to pharmacy. 

## 2021-01-07 ENCOUNTER — Ambulatory Visit: Payer: Medicare Other | Admitting: Primary Care

## 2021-01-13 ENCOUNTER — Encounter: Payer: Self-pay | Admitting: Primary Care

## 2021-01-13 ENCOUNTER — Other Ambulatory Visit: Payer: Self-pay

## 2021-01-13 ENCOUNTER — Ambulatory Visit (INDEPENDENT_AMBULATORY_CARE_PROVIDER_SITE_OTHER): Payer: Medicare Other | Admitting: Primary Care

## 2021-01-13 VITALS — BP 144/72 | HR 73 | Temp 98.4°F | Ht 65.0 in | Wt 127.0 lb

## 2021-01-13 DIAGNOSIS — E119 Type 2 diabetes mellitus without complications: Secondary | ICD-10-CM

## 2021-01-13 LAB — POCT GLYCOSYLATED HEMOGLOBIN (HGB A1C): Hemoglobin A1C: 7.3 % — AB (ref 4.0–5.6)

## 2021-01-13 NOTE — Patient Instructions (Signed)
Continue metformin and glimepiride for diabetes.  Please schedule a physical to meet with me in 3 months.   It was a pleasure to see you today!

## 2021-01-13 NOTE — Progress Notes (Signed)
Subjective:    Patient ID: Morgan Bowen, female    DOB: 18-Aug-1935, 85 y.o.   MRN: [id number]  HPI  Morgan Bowen is a very pleasant 85 y.o. female with a history of type 2 diabetes, hypertension, DVT, anxiety and depression, decreased renal function who presents today for follow up of diabetes.  She is also needing her handicap placard renewed.   Current medications include: Glimepiride 2 mg BID, metformin 1000 mg BID.   She is checking her blood glucose 1 times daily and is getting readings of low 100's.   Last A1C: 8.0 in March 2022, 7.3 today Last Eye Exam: UTD Last Foot Exam: Due Pneumonia Vaccination: UTD ACE/ARB: irbesartan  Statin: None   BP Readings from Last 3 Encounters:  01/13/21 (!) 144/72  10/07/20 (!) 146/60  06/20/20 (!) 173/75           Review of Systems  Eyes: Negative for visual disturbance.  Respiratory: Negative for shortness of breath.   Cardiovascular: Negative for chest pain.  Neurological: Negative for dizziness and numbness.         Past Medical History:  Diagnosis Date  . Acute cystitis 05/23/2018  . Anxiety   . Arthritis   . Cellulitis 12/13/2018  . Chickenpox   . Diabetes mellitus without complication ([place])   . GERD (gastroesophageal reflux disease)   . Hypertension   . Osteoporosis   . Type 2 diabetes mellitus ([place])   . Urinary tract infection     Social History   Socioeconomic History  . Marital status: Widowed    Spouse name: Not on file  . Number of children: Not on file  . Years of education: Not on file  . Highest education level: Not on file  Occupational History  . Not on file  Tobacco Use  . Smoking status: Never Smoker  . Smokeless tobacco: Never Used  Vaping Use  . Vaping Use: Never used  Substance and Sexual Activity  . Alcohol use: No  . Drug use: No  . Sexual activity: Not Currently  Other Topics Concern  . Not on file  Social History Narrative   ** Merged History Encounter **        Widowed. Moved from [place]. 1 son, 3 grandchildren. Retired. Once worked as a Agricultural engineer.    Social Determinants of Health   Financial Resource Strain: Low Risk   . Difficulty of Paying Living Expenses: Not very hard  Food Insecurity: No Food Insecurity  . Worried About Charity fundraiser in the Last Year: Never true  . Ran Out of Food in the Last Year: Never true  Transportation Needs: No Transportation Needs  . Lack of Transportation (Medical): No  . Lack of Transportation (Non-Medical): No  Physical Activity: Inactive  . Days of Exercise per Week: 0 days  . Minutes of Exercise per Session: 0 min  Stress: No Stress Concern Present  . Feeling of Stress : Not at all  Social Connections: Not on file  Intimate Partner Violence: Not At Risk  . Fear of Current or Ex-Partner: No  . Emotionally Abused: No  . Physically Abused: No  . Sexually Abused: No    Past Surgical History:  Procedure Laterality Date  . ABDOMINAL HYSTERECTOMY  1984  . ABDOMINAL HYSTERECTOMY    . APPENDECTOMY  1958  . CHOLECYSTECTOMY    . GALLBLADDER SURGERY    . MANDIBLE FRACTURE SURGERY  07/13/2016  . TONSILLECTOMY  1958  . TONSILLECTOMY    .  WRIST FRACTURE SURGERY Right    2000    Family History  Problem Relation Age of Onset  . Hypertension Mother   . Stroke Mother   . Stroke Father   . Hypertension Father   . Hypertension Sister   . Kidney disease Sister   . Breast cancer Neg Hx     Allergies  Allergen Reactions  . Sulfamethoxazole-Trimethoprim   . Penicillins Rash    Current Outpatient Medications on File Prior to Visit  Medication Sig Dispense Refill  . amLODipine (NORVASC) 10 MG tablet One tablet by mouth every day for blood pressure. 90 tablet 1  . Calcium-Magnesium-Vitamin D [phone] MG-MG-UNIT TABS Take 1 tablet by mouth daily.    . cloNIDine (CATAPRES) 0.1 MG tablet Take 1 tablet by mouth once daily as needed for systolic blood pressure greater than 170. 30 tablet 0   . glimepiride (AMARYL) 2 MG tablet Take 1 tablet (2 mg total) by mouth 2 (two) times daily with a meal. 180 tablet 3  . glucose blood (ONE TOUCH ULTRA TEST) test strip USE AS DIRECTED TEST 3 TIMES DAILY E11.9 100 each 2  . irbesartan (AVAPRO) 300 MG tablet Take 1 tablet (300 mg total) by mouth daily. For blood pressure. 90 tablet 0  . metFORMIN (GLUCOPHAGE) 1000 MG tablet Take 1 tablet (1,000 mg total) by mouth 2 (two) times daily with a meal. For diabetes. 180 tablet 3  . metoprolol succinate (TOPROL-XL) 25 MG 24 hr tablet TAKE 1 TABLET BY MOUTH EVERY DAY FOR BLOOD PRESSURE 90 tablet 0  . UNABLE TO FIND USE AS DIRECTED 3 TIMES A DAY    . Vitamin D, Ergocalciferol, (DRISDOL) 1.25 MG (50000 UNIT) CAPS capsule TAKE 1 CAPSULE BY MOUTH ONE TIME PER WEEK for low vitamin D. 12 capsule 0   No current facility-administered medications on file prior to visit.    BP (!) 144/72   Pulse 73   Temp 98.4 F (36.9 C) (Temporal)   Ht 5\' 5"  (1.651 m)   Wt 127 lb (57.6 kg)   SpO2 98%   BMI 21.13 kg/m  Objective:   Physical Exam Cardiovascular:     Rate and Rhythm: Normal rate and regular rhythm.  Pulmonary:     Effort: Pulmonary effort is normal.  Musculoskeletal:     Cervical back: Neck supple.  Skin:    General: Skin is warm and dry.           Assessment & Plan:      This visit occurred during the SARS-CoV-2 public health emergency.  Safety protocols were in place, including screening questions prior to the visit, additional usage of staff PPE, and extensive cleaning of exam room while observing appropriate contact time as indicated for disinfecting solutions.

## 2021-01-13 NOTE — Assessment & Plan Note (Signed)
Improved and well controlled with A1C today of 7.3!  Continue metformin 1000 mg BID and glimepiride 2 mg BID.   Follow up in 3 months for CPE. Foot exam today.

## 2021-03-23 ENCOUNTER — Other Ambulatory Visit: Payer: Self-pay

## 2021-03-23 ENCOUNTER — Ambulatory Visit (INDEPENDENT_AMBULATORY_CARE_PROVIDER_SITE_OTHER): Payer: Medicare Other | Admitting: Podiatry

## 2021-03-23 ENCOUNTER — Encounter: Payer: Self-pay | Admitting: Podiatry

## 2021-03-23 DIAGNOSIS — M79674 Pain in right toe(s): Secondary | ICD-10-CM | POA: Diagnosis not present

## 2021-03-23 DIAGNOSIS — B351 Tinea unguium: Secondary | ICD-10-CM | POA: Diagnosis not present

## 2021-03-23 DIAGNOSIS — D689 Coagulation defect, unspecified: Secondary | ICD-10-CM

## 2021-03-23 DIAGNOSIS — E119 Type 2 diabetes mellitus without complications: Secondary | ICD-10-CM

## 2021-03-23 DIAGNOSIS — M79675 Pain in left toe(s): Secondary | ICD-10-CM | POA: Diagnosis not present

## 2021-03-23 NOTE — Progress Notes (Signed)
Complaint:  Visit Type: This patient presents to the office for at risk foot care patient requires this care by professional since  this patient will be at risk  due to having diabetes.  Patient is unable to cut her own toenails since she cannot reach down.  She presents for at risk foot care today.    Podiatric Exam: Vascular: dorsalis pedis and posterior tibial pulses are palpable bilateral. Capillary return is immediate. Temperature gradient is WNL. Skin turgor WNL  Sensorium: Normal Semmes Weinstein monofilament test. Normal tactile sensation bilaterally. Nail Exam: Pt has thick disfigured discolored nails with subungual debris noted bilateral entire nail hallux.  Nails are thick disfigured and discolored hallux toenails. Ulcer Exam: There is no evidence of ulcer or pre-ulcerative changes or infection. Orthopedic Exam: Muscle tone and strength are WNL. No limitations in general ROM. No crepitus or effusions noted. Foot type and digits show no abnormalities. Bony prominences are unremarkable. Skin: No Porokeratosis. No infection or ulcers  Diagnosis:  Onychomycosis, , Pain in right toe, pain in left toes  Midfoot arthritis.  Treatment & Plan Procedures and Treatment: Consent by patient was obtained for treatment procedures.   Debridement of mycotic and hypertrophic toenails, 1 through 5 bilateral and clearing of subungual debris. No ulceration, no infection noted.  Told the patient to return for periodic foot evaluation to reduce potential  at risk complications   Return Visit-Office Procedure: Patient instructed to return to the office for a follow up visit 3 months for continued evaluation and treatment.    Hai Grabe DPM 

## 2021-03-24 ENCOUNTER — Telehealth: Payer: Self-pay

## 2021-03-24 NOTE — Chronic Care Management (AMB) (Addendum)
Chronic Care Management Pharmacy Assistant   Name: Morgan Bowen  MRN: [id number] DOB: March 29, 1936  Reason for Encounter: Medication Adherence and Delivery Coordination    Recent office visits:  01/13/21- Morgan Bowen PCP- Patient presents for follow up diabetes. No medication changes. Follow up 3 months.  Recent consult visits:  03/23/21- Podiatry-Patient presents for toenail trimming.No medication changes  Hospital visits:  None in previous 6 months  Medications: Outpatient Encounter Medications as of 03/24/2021  Medication Sig   amLODipine (NORVASC) 10 MG tablet One tablet by mouth every day for blood pressure.   Calcium-Magnesium-Vitamin D [id number] MG-MG-UNIT TABS Take 1 tablet by mouth daily.   cloNIDine (CATAPRES) 0.1 MG tablet Take 1 tablet by mouth once daily as needed for systolic blood pressure greater than 170.   glimepiride (AMARYL) 2 MG tablet Take 1 tablet (2 mg total) by mouth 2 (two) times daily with a meal.   glucose blood (ONE TOUCH ULTRA TEST) test strip USE AS DIRECTED TEST 3 TIMES DAILY E11.9   irbesartan (AVAPRO) 300 MG tablet Take 1 tablet (300 mg total) by mouth daily. For blood pressure.   metFORMIN (GLUCOPHAGE) 1000 MG tablet Take 1 tablet (1,000 mg total) by mouth 2 (two) times daily with a meal. For diabetes.   metoprolol succinate (TOPROL-XL) 25 MG 24 hr tablet TAKE 1 TABLET BY MOUTH EVERY DAY FOR BLOOD PRESSURE   UNABLE TO FIND USE AS DIRECTED 3 TIMES A DAY   Vitamin D, Ergocalciferol, (DRISDOL) 1.25 MG (50000 UNIT) CAPS capsule TAKE 1 CAPSULE BY MOUTH ONE TIME PER WEEK for low vitamin D.   No facility-administered encounter medications on file as of 03/24/2021.   BP Readings from Last 3 Encounters:  01/13/21 (!) 144/72  10/07/20 (!) 146/60  06/20/20 (!) 173/75    Lab Results  Component Value Date   HGBA1C 7.3 (A) 01/13/2021      Recent OV, Consult or Hospital visit:  03/23/21 Podiatry, 01/13/21 PCP  No medication changes indicated   Last  adherence delivery date:  12/31/20      Patient is due for next adherence delivery on: 04/02/21  Spoke with patient on 03/24/21 reviewed medications and coordinated delivery.  This delivery to include: Vials  90 Days VIAL medications: Vitamin D 12.50 mcg (50,000 units) - 1 tablet every Friday (breakfast) Glimepiride '2mg'$  - 2 tablets daily (2- lunch) Amlodipine '10mg'$ -  1 tablet daily (breakfast) Irbesartan '300mg'$ - 1 tablet daily (bedtime) Metoprolol '25mg'$ - 1 tablet daily (bedtime) Metformin '1000mg'$  1 tablet twice daily (1 breakfast, 1 evening meal)  Patient declined the following medications this month: None   Any concerns about your medications? No  How often do you forget or accidentally miss a dose? Never  Do you use a pillbox? Yes  Is patient in packaging No  Refills requested from PCP include: Vitamin D, Irbesartan, Metoprolol (requested 8/11 and 8/19)  Confirmed delivery date of 04/02/2021, advised patient that pharmacy will contact them the morning of delivery.  Recent blood pressure readings  as follows: 03/24/2021  139/68  67P  Recent blood glucose readings are as follows: Fasting:  03/24/2021  [place], CPP notified  Morgan Bowen, [place] Assistant [phone]  I have reviewed the care management and care coordination activities outlined in this encounter and I am certifying that I agree with the content of this note. Requested refills from PCP since they have not yet been received through pharmacy request on 8/11. No further action required.  Morgan Bowen, PharmD Clinical Pharmacist [facility] at [facility] [phone]

## 2021-03-30 ENCOUNTER — Other Ambulatory Visit: Payer: Self-pay

## 2021-03-30 DIAGNOSIS — I10 Essential (primary) hypertension: Secondary | ICD-10-CM

## 2021-03-30 DIAGNOSIS — M810 Age-related osteoporosis without current pathological fracture: Secondary | ICD-10-CM

## 2021-03-30 MED ORDER — VITAMIN D (ERGOCALCIFEROL) 1.25 MG (50000 UNIT) PO CAPS: ORAL_CAPSULE | ORAL | 0 refills | Status: DC

## 2021-03-30 MED ORDER — IRBESARTAN 300 MG PO TABS: 300.0000 mg | ORAL_TABLET | Freq: Every day | ORAL | 0 refills | Status: DC

## 2021-03-30 MED ORDER — METOPROLOL SUCCINATE ER 25 MG PO TB24: ORAL_TABLET | ORAL | 0 refills | Status: DC

## 2021-03-30 NOTE — Telephone Encounter (Signed)
Ok to continue refill at once a week dose. Refill request received from pharmacy.

## 2021-04-10 ENCOUNTER — Ambulatory Visit: Payer: Medicare Other

## 2021-04-14 ENCOUNTER — Other Ambulatory Visit: Payer: Self-pay

## 2021-04-14 ENCOUNTER — Ambulatory Visit (INDEPENDENT_AMBULATORY_CARE_PROVIDER_SITE_OTHER): Payer: Medicare Other | Admitting: Primary Care

## 2021-04-14 VITALS — BP 160/62 | HR 97 | Temp 97.8°F | Ht 65.0 in | Wt 124.5 lb

## 2021-04-14 DIAGNOSIS — E559 Vitamin D deficiency, unspecified: Secondary | ICD-10-CM

## 2021-04-14 DIAGNOSIS — M810 Age-related osteoporosis without current pathological fracture: Secondary | ICD-10-CM

## 2021-04-14 DIAGNOSIS — E785 Hyperlipidemia, unspecified: Secondary | ICD-10-CM

## 2021-04-14 DIAGNOSIS — E119 Type 2 diabetes mellitus without complications: Secondary | ICD-10-CM

## 2021-04-14 DIAGNOSIS — F32A Depression, unspecified: Secondary | ICD-10-CM | POA: Diagnosis not present

## 2021-04-14 DIAGNOSIS — I10 Essential (primary) hypertension: Secondary | ICD-10-CM | POA: Diagnosis not present

## 2021-04-14 DIAGNOSIS — F419 Anxiety disorder, unspecified: Secondary | ICD-10-CM | POA: Diagnosis not present

## 2021-04-14 MED ORDER — SERTRALINE HCL 25 MG PO TABS: 25.0000 mg | ORAL_TABLET | Freq: Every day | ORAL | 0 refills | Status: DC

## 2021-04-14 NOTE — Assessment & Plan Note (Signed)
Appears anxious today, she doesn't wish to disclose specific details. Chronic history.  Discussed options, she would like to try medication. Rx for sertraline 25 mg sent to pharmacy.   We discussed possible side effects of headache, GI upset, drowsiness, and SI/HI. If thoughts of SI/HI develop, we discussed to present to the emergency immediately. Patient verbalized understanding.   She will update in 4 weeks.

## 2021-04-14 NOTE — Progress Notes (Signed)
Subjective:    Patient ID: Morgan Bowen, female    DOB: June 01, 1936, 85 y.o.   MRN: [id number]  HPI  Morgan Bowen is a very pleasant 85 y.o. female with a history of hypertension, type 2 diabetes, osteoporosis, anxiety and depression, palpitations who presents today for follow up of chronic conditions.  She does not wish to pursue a mammogram.   Chronic history of anxiety and depression, increased anxiety over the last 1-2 weeks. Symptoms include feeling palpitations and nervous, occur daily. She's never been treated with medications, would like something to take the edge off.   Compliant to her glimepiride 2 mg BID and metformin 1000 mg BID.   Compliant to her irbesartan 300 mg, amlodipine 10 mg daily, metoprolol succinate 25 mg daily, and clonidine 0.1 PRN (no recent use). She is checking her BP at home which is running mostly in the 130's-140's/70's. She denies chest pain, headaches.   BP Readings from Last 3 Encounters:  04/14/21 (!) 178/72  01/13/21 (!) 144/72  10/07/20 (!) 146/60       Review of Systems  Eyes:  Negative for visual disturbance.  Respiratory:  Negative for shortness of breath.   Cardiovascular:  Negative for chest pain.  Gastrointestinal:  Negative for abdominal pain, constipation and diarrhea.  Neurological:  Negative for dizziness and headaches.  Psychiatric/Behavioral:  The patient is nervous/anxious.         Past Medical History:  Diagnosis Date   Acute cystitis 05/23/2018   Anxiety    Arthritis    Cellulitis 12/13/2018   Chickenpox    Diabetes mellitus without complication ([facility])    GERD (gastroesophageal reflux disease)    Hypertension    Osteoporosis    Type 2 diabetes mellitus ([place])    Urinary tract infection     Social History   Socioeconomic History   Marital status: Widowed    Spouse name: Not on file   Number of children: Not on file   Years of education: Not on file   Highest education level: Not on file  Occupational History    Not on file  Tobacco Use   Smoking status: Never   Smokeless tobacco: Never  Vaping Use   Vaping Use: Never used  Substance and Sexual Activity   Alcohol use: No   Drug use: No   Sexual activity: Not Currently  Other Topics Concern   Not on file  Social History Narrative   ** Merged History Encounter **       Widowed. Moved from [place]. 1 son, 3 grandchildren. Retired. Once worked as a Agricultural engineer.    Social Determinants of Health   Financial Resource Strain: Low Risk    Difficulty of Paying Living Expenses: Not very hard  Food Insecurity: Not on file  Transportation Needs: Not on file  Physical Activity: Not on file  Stress: Not on file  Social Connections: Not on file  Intimate Partner Violence: Not on file    Past Surgical History:  Procedure Laterality Date   [place]  07/13/2016   TONSILLECTOMY  1958   TONSILLECTOMY     WRIST FRACTURE SURGERY Right    2000    Family History  Problem Relation Age of Onset   Hypertension Mother    Stroke Mother    Stroke Father  Hypertension Father    Hypertension Sister    Kidney disease Sister    Breast cancer Neg Hx     Allergies  Allergen Reactions   Sulfamethoxazole-Trimethoprim    Penicillins Rash    Current Outpatient Medications on File Prior to Visit  Medication Sig Dispense Refill   amLODipine (NORVASC) 10 MG tablet One tablet by mouth every day for blood pressure. 90 tablet 1   Calcium-Magnesium-Vitamin D [id number] MG-MG-UNIT TABS Take 1 tablet by mouth daily.     cloNIDine (CATAPRES) 0.1 MG tablet Take 1 tablet by mouth once daily as needed for systolic blood pressure greater than 170. 30 tablet 0   glimepiride (AMARYL) 2 MG tablet Take 1 tablet (2 mg total) by mouth 2 (two) times daily with a meal. 180 tablet 3   glucose blood (ONE TOUCH ULTRA TEST)  test strip USE AS DIRECTED TEST 3 TIMES DAILY E11.9 100 each 2   irbesartan (AVAPRO) 300 MG tablet Take 1 tablet (300 mg total) by mouth daily. For blood pressure. 90 tablet 0   metFORMIN (GLUCOPHAGE) 1000 MG tablet Take 1 tablet (1,000 mg total) by mouth 2 (two) times daily with a meal. For diabetes. 180 tablet 3   metoprolol succinate (TOPROL-XL) 25 MG 24 hr tablet TAKE 1 TABLET BY MOUTH EVERY DAY FOR BLOOD PRESSURE 90 tablet 0   UNABLE TO FIND USE AS DIRECTED 3 TIMES A DAY     Vitamin D, Ergocalciferol, (DRISDOL) 1.25 MG (50000 UNIT) CAPS capsule TAKE 1 CAPSULE BY MOUTH ONE TIME PER WEEK for low vitamin D. 12 capsule 0   No current facility-administered medications on file prior to visit.    BP (!) 178/72   Pulse 97   Temp 97.8 F (36.6 C) (Temporal)   Ht '5\' 5"'$  (1.651 m)   Wt 124 lb 8 oz (56.5 kg)   SpO2 98%   BMI 20.72 kg/m  Objective:   Physical Exam Cardiovascular:     Rate and Rhythm: Normal rate and regular rhythm.  Pulmonary:     Effort: Pulmonary effort is normal.     Breath sounds: Normal breath sounds.  Abdominal:     General: Bowel sounds are normal.     Tenderness: There is no abdominal tenderness.  Musculoskeletal:     Cervical back: Neck supple.  Skin:    General: Skin is warm and dry.  Psychiatric:        Mood and Affect: Mood normal.          Assessment & Plan:      This visit occurred during the SARS-CoV-2 public health emergency.  Safety protocols were in place, including screening questions prior to the visit, additional usage of staff PPE, and extensive cleaning of exam room while observing appropriate contact time as indicated for disinfecting solutions.

## 2021-04-14 NOTE — Assessment & Plan Note (Signed)
Above goal in the office today, home readings are at goal. She does appear anxious today, will be treating anxiety.   She will continue to monitor BP at home and report readings if they are consistently at or above 105/90.  Continue amlodipine 10 mg, irbesartan 300 mg, and metoprolol succinate 25 mg. She has never used clonidine 0.1 mg.   CMP pending.

## 2021-04-14 NOTE — Assessment & Plan Note (Signed)
Compliant to glimepiride 2 mg BID and metformin 1000 mg BID. Continue same.  Repeat A1C pending.   Managed on ARB.  Refuses statin therapy. Pneumonia vaccine UTD.   Follow up in 6 months.

## 2021-04-14 NOTE — Addendum Note (Signed)
Addended by: Cloyd Stagers on: 04/14/2021 10:17 AM   Modules accepted: Orders

## 2021-04-14 NOTE — Assessment & Plan Note (Signed)
Compliant to vitamin D and calcium, continues to refuse RX treatment.

## 2021-04-14 NOTE — Patient Instructions (Signed)
Stop by the lab prior to leaving today. I will notify you of your results once received.   Start sertraline (Zoloft) 25 mg for anxiety and depression. Take 1/2 tablet by mouth once daily for about one week, then increase to 1 full tablet thereafter.   Please contact me if you experience any problems when starting this medication or with the dose increase to 1 full tablet. Common side effects should abate within 1-2 weeks.   Please update me in about 4 weeks.  Please schedule a follow up visit for 6 months for diabetes check.  It was a pleasure to see you today!

## 2021-04-14 NOTE — Assessment & Plan Note (Signed)
Compliant to vitamin D 50,000 units weekly, continue same. Repeat vitamin D level pending

## 2021-04-15 ENCOUNTER — Other Ambulatory Visit (INDEPENDENT_AMBULATORY_CARE_PROVIDER_SITE_OTHER): Payer: Medicare Other

## 2021-04-15 ENCOUNTER — Encounter: Payer: Medicare Other | Admitting: Primary Care

## 2021-04-15 DIAGNOSIS — E785 Hyperlipidemia, unspecified: Secondary | ICD-10-CM

## 2021-04-15 DIAGNOSIS — E559 Vitamin D deficiency, unspecified: Secondary | ICD-10-CM | POA: Diagnosis not present

## 2021-04-15 DIAGNOSIS — E119 Type 2 diabetes mellitus without complications: Secondary | ICD-10-CM

## 2021-04-15 DIAGNOSIS — I10 Essential (primary) hypertension: Secondary | ICD-10-CM | POA: Diagnosis not present

## 2021-04-15 LAB — COMPREHENSIVE METABOLIC PANEL
ALT: 15 U/L (ref 0–35)
AST: 13 U/L (ref 0–37)
Albumin: 4.2 g/dL (ref 3.5–5.2)
Alkaline Phosphatase: 72 U/L (ref 39–117)
BUN: 19 mg/dL (ref 6–23)
CO2: 25 mEq/L (ref 19–32)
Calcium: 9.3 mg/dL (ref 8.4–10.5)
Chloride: 102 mEq/L (ref 96–112)
Creatinine, Ser: 0.98 mg/dL (ref 0.40–1.20)
GFR: 53.5 mL/min — ABNORMAL LOW (ref >60.00)
Glucose, Bld: 222 mg/dL — ABNORMAL HIGH (ref 70–99)
Potassium: 3.7 mEq/L (ref 3.5–5.1)
Sodium: 138 mEq/L (ref 135–145)
Total Bilirubin: 0.6 mg/dL (ref 0.2–1.2)
Total Protein: 7.9 g/dL (ref 6.0–8.3)

## 2021-04-15 LAB — LIPID PANEL
Cholesterol: 185 mg/dL (ref 0–200)
HDL: 56.7 mg/dL (ref >39.00)
LDL Cholesterol: 108 mg/dL — ABNORMAL HIGH (ref 0–99)
NonHDL: 127.97
Total CHOL/HDL Ratio: 3
Triglycerides: 101 mg/dL (ref 0.0–149.0)
VLDL: 20.2 mg/dL (ref 0.0–40.0)

## 2021-04-15 LAB — VITAMIN D 25 HYDROXY (VIT D DEFICIENCY, FRACTURES): VITD: 78.93 ng/mL (ref 30.00–100.00)

## 2021-04-15 LAB — HEMOGLOBIN A1C: Hgb A1c MFr Bld: 8.2 % — ABNORMAL HIGH (ref 4.6–6.5)

## 2021-04-18 ENCOUNTER — Emergency Department: Payer: Medicare Other

## 2021-04-18 ENCOUNTER — Other Ambulatory Visit: Payer: Self-pay

## 2021-04-18 ENCOUNTER — Emergency Department
Admission: EM | Admit: 2021-04-18 | Discharge: 2021-04-18 | Disposition: A | Discharge status: Home / Self Care | Payer: Medicare Other | Attending: Emergency Medicine | Admitting: Emergency Medicine

## 2021-04-18 DIAGNOSIS — I10 Essential (primary) hypertension: Secondary | ICD-10-CM | POA: Insufficient documentation

## 2021-04-18 DIAGNOSIS — R000 Tachycardia, unspecified: Secondary | ICD-10-CM | POA: Diagnosis not present

## 2021-04-18 DIAGNOSIS — Z7984 Long term (current) use of oral hypoglycemic drugs: Secondary | ICD-10-CM | POA: Diagnosis not present

## 2021-04-18 DIAGNOSIS — R002 Palpitations: Secondary | ICD-10-CM | POA: Diagnosis not present

## 2021-04-18 DIAGNOSIS — E119 Type 2 diabetes mellitus without complications: Secondary | ICD-10-CM | POA: Diagnosis not present

## 2021-04-18 DIAGNOSIS — Z79899 Other long term (current) drug therapy: Secondary | ICD-10-CM | POA: Insufficient documentation

## 2021-04-18 LAB — BASIC METABOLIC PANEL
Anion gap: 11 (ref 5–15)
BUN: 25 mg/dL — ABNORMAL HIGH (ref 8–23)
CO2: 21 mmol/L — ABNORMAL LOW (ref 22–32)
Calcium: 9.1 mg/dL (ref 8.9–10.3)
Chloride: 104 mmol/L (ref 98–111)
Creatinine, Ser: 1.12 mg/dL — ABNORMAL HIGH (ref 0.44–1.00)
GFR, Estimated: 49 mL/min — ABNORMAL LOW (ref >60)
Glucose, Bld: 169 mg/dL — ABNORMAL HIGH (ref 70–99)
Potassium: 3.2 mmol/L — ABNORMAL LOW (ref 3.5–5.1)
Sodium: 136 mmol/L (ref 135–145)

## 2021-04-18 LAB — CBC
HCT: 33.5 % — ABNORMAL LOW (ref 36.0–46.0)
Hemoglobin: 12.1 g/dL (ref 12.0–15.0)
MCH: 32.4 pg (ref 26.0–34.0)
MCHC: 36.1 g/dL — ABNORMAL HIGH (ref 30.0–36.0)
MCV: 89.8 fL (ref 80.0–100.0)
Platelets: 262 10*3/uL (ref 150–400)
RBC: 3.73 MIL/uL — ABNORMAL LOW (ref 3.87–5.11)
RDW: 12.9 % (ref 11.5–15.5)
WBC: 9.3 10*3/uL (ref 4.0–10.5)
nRBC: 0 % (ref 0.0–0.2)

## 2021-04-18 LAB — TROPONIN I (HIGH SENSITIVITY): Troponin I (High Sensitivity): 5 ng/L (ref <18)

## 2021-04-18 NOTE — ED Notes (Addendum)
Pt states that she started taking 'an anxiety pill' 3 days ago. States that she read on the prescription paperwork that fast heart rate is a side effect.  She states that she has taken a half pill for the past week.

## 2021-04-18 NOTE — ED Notes (Signed)
Pt resting in bed, NAD, friend at bedside.

## 2021-04-18 NOTE — ED Provider Notes (Signed)
[facility] Emergency Department Provider Note   ____________________________________________   Event Date/Time   First MD Initiated Contact with Patient 04/18/21 1725     (approximate)  I have reviewed the triage vital signs and the nursing notes.   HISTORY  Chief Complaint Palpitations    HPI Morgan Bowen is a 85 y.o. female who presents for palpitations and tachycardia  LOCATION: Chest DURATION: Began a few minutes prior to arrival TIMING: Resolved since onset SEVERITY: Mild QUALITY: Palpitations CONTEXT: Patient states that she just started a new psychiatric medication of Zoloft approximately 2 days prior to arrival and has had similar symptoms when starting medications in the past.  Patient noticed that she was having the palpitations and checked her heart rate on a pulse ox that she has at home and found to be in the 140s MODIFYING FACTORS: Denies any exacerbating or relieving factors ASSOCIATED SYMPTOMS: Mild intermittent sharp central chest pain that lasts less than 1 second and resolved spontaneously   Per medical record review, patient has history of GERD          Past Medical History:  Diagnosis Date   Acute cystitis 05/23/2018   Anxiety    Arthritis    Cellulitis 12/13/2018   Chickenpox    Diabetes mellitus without complication (HCC)    GERD (gastroesophageal reflux disease)    Hypertension    Osteoporosis    Type 2 diabetes mellitus ([place])    Urinary tract infection     Patient Active Problem List   Diagnosis Date Noted   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   On apixaban therapy 04/21/2020   Palpitations 04/21/2020   Acute deep vein thrombosis (DVT) of right tibial vein ([place]) 04/03/2020   Pain due to onychomycosis of toenails of both feet 01/22/2019   Vitamin D deficiency 06/26/2018   Decreased renal function 03/21/2018   Non-traumatic compression fracture of lumbar vertebra with routine healing 08/26/2017    Anxiety and depression 08/26/2017   Type 2 diabetes mellitus ([place]) 04/27/2017   Essential hypertension 04/27/2017   Osteoporosis 04/27/2017    Past Surgical History:  Procedure Laterality Date   ABDOMINAL HYSTERECTOMY  1984   ABDOMINAL HYSTERECTOMY     [place]  07/13/2016   TONSILLECTOMY  1958   TONSILLECTOMY     WRIST FRACTURE SURGERY Right    2000    Prior to Admission medications   Medication Sig Start Date End Date Taking? Authorizing Provider  amLODipine (NORVASC) 10 MG tablet One tablet by mouth every day for blood pressure. 11/20/20   [name], NP  Calcium-Magnesium-Vitamin D [phone] MG-MG-UNIT TABS Take 1 tablet by mouth daily.    [provider]  cloNIDine (CATAPRES) 0.1 MG tablet Take 1 tablet by mouth once daily as needed for systolic blood pressure greater than 170. 10/24/20   [name], NP  glimepiride (AMARYL) 2 MG tablet Take 1 tablet (2 mg total) by mouth 2 (two) times daily with a meal. 10/07/20   [name], NP  glucose blood (ONE TOUCH ULTRA TEST) test strip USE AS DIRECTED TEST 3 TIMES DAILY E11.9 01/18/19   [name], NP  irbesartan (AVAPRO) 300 MG tablet Take 1 tablet (300 mg total) by mouth daily. For blood pressure. 03/30/21   [name], NP  metFORMIN (GLUCOPHAGE) 1000 MG tablet Take 1 tablet (1,000 mg total) by  mouth 2 (two) times daily with a meal. For diabetes. 10/07/20   [name], NP  metoprolol succinate (TOPROL-XL) 25 MG 24 hr tablet TAKE 1 TABLET BY MOUTH EVERY DAY FOR BLOOD PRESSURE 03/30/21   [name], NP  sertraline (ZOLOFT) 25 MG tablet Take 1 tablet (25 mg total) by mouth daily. For anxiety. 04/14/21   [name], NP  UNABLE TO FIND USE AS DIRECTED 3 TIMES A DAY 05/05/15   [provider]  Vitamin D, Ergocalciferol, (DRISDOL) 1.25 MG (50000 UNIT) CAPS capsule TAKE 1 CAPSULE BY MOUTH ONE  TIME PER WEEK for low vitamin D. 03/30/21   [name], NP    Allergies Sulfamethoxazole-trimethoprim and Penicillins  Family History  Problem Relation Age of Onset   Hypertension Mother    Stroke Mother    Stroke Father    Hypertension Father    Hypertension Sister    Kidney disease Sister    Breast cancer Neg Hx     Social History Social History   Tobacco Use   Smoking status: Never   Smokeless tobacco: Never  Vaping Use   Vaping Use: Never used  Substance Use Topics   Alcohol use: No   Drug use: No    Review of Systems Constitutional: No fever/chills Eyes: No visual changes. ENT: No sore throat. Cardiovascular: Endorses palpitations.  Endorses intermittent sharp chest pain. Respiratory: Denies shortness of breath. Gastrointestinal: No abdominal pain.  No nausea, no vomiting.  No diarrhea. Genitourinary: Negative for dysuria. Musculoskeletal: Negative for acute arthralgias Skin: Negative for rash. Neurological: Negative for headaches, weakness/numbness/paresthesias in any extremity Psychiatric: Negative for suicidal ideation/homicidal ideation   ____________________________________________   PHYSICAL EXAM:  VITAL SIGNS: ED Triage Vitals [04/18/21 1717]  Enc Vitals Group     BP (!) 189/68     Pulse Rate (!) 124     Resp 20     Temp 98 F (36.7 C)     Temp Source Oral     SpO2 98 %     Weight 121 lb (54.9 kg)     Height '5\' 5"'$  (1.651 m)     Head Circumference      Peak Flow      Pain Score 0     Pain Loc      Pain Edu?      Excl. in [place]?    Constitutional: Alert and oriented. Well appearing and in no acute distress. Eyes: Conjunctivae are normal. PERRL. Head: Atraumatic. Nose: No congestion/rhinnorhea. Mouth/Throat: Mucous membranes are moist. Neck: No stridor Cardiovascular: Grossly normal heart sounds.  Good peripheral circulation. Respiratory: Normal respiratory effort.  No retractions. Gastrointestinal: Soft and nontender. No  distention. Musculoskeletal: No obvious deformities Neurologic:  Normal speech and language. No gross focal neurologic deficits are appreciated. Skin:  Skin is warm and dry. No rash noted. Psychiatric: Mood and affect are normal. Speech and behavior are normal.  ____________________________________________   LABS (all labs ordered are listed, but only abnormal results are displayed)  Labs Reviewed  BASIC METABOLIC PANEL - Abnormal; Notable for the following components:      Result Value   Potassium 3.2 (*)    CO2 21 (*)    Glucose, Bld 169 (*)    BUN 25 (*)    Creatinine, Ser 1.12 (*)    GFR, Estimated 49 (*)    All other components within normal limits  CBC - Abnormal; Notable for the following components:   RBC 3.73 (*)  HCT 33.5 (*)    MCHC 36.1 (*)    All other components within normal limits  TROPONIN I (HIGH SENSITIVITY)  TROPONIN I (HIGH SENSITIVITY)   ____________________________________________  EKG  ED ECG REPORT I, Naaman Plummer, the attending physician, personally viewed and interpreted this ECG.  Date: 04/18/2021 EKG Time: 1714 Rate: 117 Rhythm: normal sinus rhythm QRS Axis: normal Intervals: normal ST/T Wave abnormalities: normal Narrative Interpretation: no evidence of acute ischemia  ____________________________________________  RADIOLOGY  ED MD interpretation: 2 view chest x-ray shows no evidence of acute abnormalities including no pneumonia, pneumothorax, or widened mediastinum  Official radiology report(s): DG Chest 2 View  Result Date: 04/18/2021 CLINICAL DATA:  Palpitations. EXAM: CHEST - 2 VIEW COMPARISON:  Chest x-ray dated April 13, 2020. FINDINGS: The heart size and mediastinal contours are within normal limits. Both lungs are clear. The visualized skeletal structures are unremarkable. IMPRESSION: No active cardiopulmonary disease. Electronically Signed   By: Titus Dubin M.D.   On: 04/18/2021 18:22     ____________________________________________   PROCEDURES  Procedure(s) performed (including Critical Care):  .1-3 Lead EKG Interpretation Performed by: Naaman Plummer, MD Authorized by: Naaman Plummer, MD     Interpretation: normal     ECG rate:  83   ECG rate assessment: normal     Rhythm: sinus rhythm     Ectopy: none     Conduction: normal     ____________________________________________   INITIAL IMPRESSION / ASSESSMENT AND PLAN / ED COURSE  As part of my medical decision making, I reviewed the following data within the electronic medical record, if available:  Nursing notes reviewed and incorporated, Labs reviewed, EKG interpreted, Old chart reviewed, Radiograph reviewed and Notes from prior ED visits reviewed and incorporated        85 year old female presents with palpitations. EKG: No STEMI and no evidence of Brugadas sign, delta wave, epsilon wave, significantly prolonged QTc, or malignant arrhythmia. Based on H&P and testing, this patient appears to be low risk for emergent causes of palpitations such as, but not limited to, a malignant cardiac arrhythmia, ACS, pulmonary embolism, thyrotoxicosis, PNA, PTX. Patient's sinus tachycardia resolved after observation here in our emergency department.  Patient was encouraged to stop her Zoloft until follow-up with her primary care physician as she has had multiple episodes of similar reactions to medications in the past including Macrobid.  Patient stressed understanding The patient has been given strict return precautions and understands the need for further outpatient testing and treatment.  Dispo: Discharge home with PCP follow-up     ____________________________________________   FINAL CLINICAL IMPRESSION(S) / ED DIAGNOSES  Final diagnoses:  Palpitations  Sinus tachycardia     ED Discharge Orders     None        Note:  This document was prepared using Dragon voice recognition software and may  include unintentional dictation errors.    Naaman Plummer, MD 04/18/21 [place]

## 2021-04-18 NOTE — ED Notes (Signed)
Pt states medication is Zoloft.

## 2021-04-18 NOTE — ED Triage Notes (Signed)
Pt to ED for heart palpitations that started a few minutes ago. Denies cp. Denies n/v/shob

## 2021-05-19 ENCOUNTER — Telehealth: Payer: Self-pay

## 2021-05-19 NOTE — Progress Notes (Signed)
    Chronic Care Management Pharmacy Assistant   Name: ANAYELLI LAI  MRN: [id number] DOB: 04-06-1936  Reason for Encounter: Reminder Call   Medications: Outpatient Encounter Medications as of 05/19/2021  Medication Sig   amLODipine (NORVASC) 10 MG tablet One tablet by mouth every day for blood pressure.   Calcium-Magnesium-Vitamin D [phone] MG-MG-UNIT TABS Take 1 tablet by mouth daily.   cloNIDine (CATAPRES) 0.1 MG tablet Take 1 tablet by mouth once daily as needed for systolic blood pressure greater than 170.   glimepiride (AMARYL) 2 MG tablet Take 1 tablet (2 mg total) by mouth 2 (two) times daily with a meal.   glucose blood (ONE TOUCH ULTRA TEST) test strip USE AS DIRECTED TEST 3 TIMES DAILY E11.9   irbesartan (AVAPRO) 300 MG tablet Take 1 tablet (300 mg total) by mouth daily. For blood pressure.   metFORMIN (GLUCOPHAGE) 1000 MG tablet Take 1 tablet (1,000 mg total) by mouth 2 (two) times daily with a meal. For diabetes.   metoprolol succinate (TOPROL-XL) 25 MG 24 hr tablet TAKE 1 TABLET BY MOUTH EVERY DAY FOR BLOOD PRESSURE   sertraline (ZOLOFT) 25 MG tablet Take 1 tablet (25 mg total) by mouth daily. For anxiety.   UNABLE TO FIND USE AS DIRECTED 3 TIMES A DAY   Vitamin D, Ergocalciferol, (DRISDOL) 1.25 MG (50000 UNIT) CAPS capsule TAKE 1 CAPSULE BY MOUTH ONE TIME PER WEEK for low vitamin D.   No facility-administered encounter medications on file as of 05/19/2021.   Unsuccessful attempt to contact [name]. Voicemail was left  to remind her of her upcoming telephone visit with [name] on 05/26/2021 at 8:30 AM. Patient was reminded to have all medications, supplements and any blood glucose and blood pressure readings available for review at appointment.  Star Rating Drugs: Medication:  Last Fill: Day Supply Irbesartan 300mg  03/30/2021 90 Metformin 1000mg  03/27/2021 90  Glimepiride 2mg  03/27/2021 [place], CPP notified  [name], [place] [facility] Pharmacy  Assistant [phone]   Time Spent: 10 minutes

## 2021-05-26 ENCOUNTER — Ambulatory Visit (INDEPENDENT_AMBULATORY_CARE_PROVIDER_SITE_OTHER): Payer: Medicare Other

## 2021-05-26 ENCOUNTER — Other Ambulatory Visit: Payer: Self-pay

## 2021-05-26 VITALS — BP 145/75 | HR 69

## 2021-05-26 DIAGNOSIS — I10 Essential (primary) hypertension: Secondary | ICD-10-CM

## 2021-05-26 DIAGNOSIS — E119 Type 2 diabetes mellitus without complications: Secondary | ICD-10-CM

## 2021-05-26 NOTE — Progress Notes (Signed)
Chronic Care Management Pharmacy Note  05/26/2021 Name:  Morgan Bowen MRN:  [id number] DOB:  07/13/1936  Summary: Pt did not tolerate sertraline (palpitations) and prefers to remain off medication for anxiety at this time. Her fasting BG within goal, however recent A1c elevated 8.2%. Encouraged to check some BG readings after meals. Home BP elevated this morning - 145/70s. Last check at home it was in 130s. Home BP averages within goal. She tends to have white coat syndrome in office. She will continue to monitor home BP.  Recommendations: No medication changes  Plan:  - CMA BG log in 2 weeks (check post prandial readings)  Subjective: Morgan Bowen is an 85 y.o. year old female who is a primary patient of [name], NP.  The CCM team was consulted for assistance with disease management and care coordination needs.    Engaged with patient by telephone for follow up visit in response to provider referral for pharmacy case management and/or care coordination services.   Consent to Services:  The patient was given information about Chronic Care Management services, agreed to services, and gave verbal consent prior to initiation of services.  Please see initial visit note for detailed documentation.   Patient Care Team: [name], NP as PCP - General (Internal Medicine) [name], NP (Internal Medicine) [name], [facility] as Pharmacist (Pharmacist)  Recent office visits: 04/14/21 - PCP - Pt presented for follow up depression, anxiety, DM, HTN, and osteoporosis. More anxious recently, start sertraline 25 mg daily. Home BP 130-140s/70s. Follow up 4 weeks. 01/13/21- [name], PCP- Patient presents for follow up diabetes. No medication changes. Follow up 3 months.  Recent consult visits: 03/23/21 - Podiatry - Patient presents for toenail trimming. No medication changes.   Hospital visits: 04/18/21 - ED Visit - Palpitations, Patient states that she just started  a new psychiatric medication of Zoloft approximately 2 days prior to arrival and has had similar symptoms when starting medications in the past.  Patient noticed that she was having the palpitations and checked her heart rate on a pulse ox that she has at home and found to be in the 140s. Patient was encouraged to stop her Zoloft until follow-up with her primary care physician as she has had multiple episodes of similar reactions to medications in the past including Macrobid.  Patient stressed understanding.  Objective:  Lab Results  Component Value Date   CREATININE 1.12 (H) 04/18/2021   BUN 25 (H) 04/18/2021   GFR 53.50 (L) 04/15/2021   GFRNONAA 49 (L) 04/18/2021   GFRAA >60 04/13/2020   NA 136 04/18/2021   K 3.2 (L) 04/18/2021   CALCIUM 9.1 04/18/2021   CO2 21 (L) 04/18/2021   GLUCOSE 169 (H) 04/18/2021   Lab Results  Component Value Date/Time   HGBA1C 8.2 (H) 04/15/2021 08:19 AM   HGBA1C 7.3 (A) 01/13/2021 10:33 AM   HGBA1C 8.0 (A) 10/07/2020 10:52 AM   HGBA1C 7.7 (H) 03/31/2020 08:24 AM   GFR 53.50 (L) 04/15/2021 08:19 AM   GFR 56.99 (L) 03/31/2020 08:24 AM    Last diabetic Eye exam:  Lab Results  Component Value Date/Time   HMDIABEYEEXA No Retinopathy 10/07/2020 12:00 AM    Last diabetic Foot exam: 01/13/21  Lab Results  Component Value Date   CHOL 185 04/15/2021   HDL 56.70 04/15/2021   LDLCALC 108 (H) 04/15/2021   TRIG 101.0 04/15/2021   CHOLHDL 3 04/15/2021    Hepatic Function Latest Ref  Rng & Units 04/15/2021 03/31/2020 06/02/2019  Total Protein 6.0 - 8.3 g/dL 7.9 7.7 8.7(H)  Albumin 3.5 - 5.2 g/dL 4.2 4.2 4.4  AST 0 - 37 U/L 13 17 22   ALT 0 - 35 U/L 15 16 19   Alk Phosphatase 39 - 117 U/L 72 64 81  Total Bilirubin 0.2 - 1.2 mg/dL 0.6 0.6 0.5    Lab Results  Component Value Date/Time   TSH 3.40 04/21/2020 12:08 PM    CBC Latest Ref Rng & Units 04/18/2021 04/13/2020 04/12/2020  WBC 4.0 - 10.5 K/uL 9.3 8.8 9.0  Hemoglobin 12.0 - 15.0 g/dL 12.1 12.0 12.5   Hematocrit 36.0 - 46.0 % 33.5(L) 34.9(L) 36.6  Platelets 150 - 400 K/uL 262 255 255    Lab Results  Component Value Date/Time   VD25OH 78.93 04/15/2021 08:19 AM   VD25OH 67.95 03/31/2020 08:24 AM   Clinical ASCVD: No  The ASCVD Risk score (Arnett DK, et al., 2019) failed to calculate for the following reasons:   The 2019 ASCVD risk score is only valid for ages 68 to 27    Depression screen PHQ 2/9 04/14/2021 04/01/2020 05/15/2019  Decreased Interest 3 0 0  Down, Depressed, Hopeless 0 0 0  PHQ - 2 Score 3 0 0  Altered sleeping 2 0 1  Tired, decreased energy 0 0 0  Change in appetite 0 0 0  Feeling bad or failure about yourself  0 0 0  Trouble concentrating 0 0 0  Moving slowly or fidgety/restless 0 0 1  Suicidal thoughts 0 0 0  PHQ-9 Score 5 0 2  Difficult doing work/chores Somewhat difficult Not difficult at all Not difficult at all    Social History   Tobacco Use  Smoking Status Never  Smokeless Tobacco Never   BP Readings from Last 3 Encounters:  04/18/21 (!) 163/53  04/14/21 (!) 160/62  01/13/21 (!) 144/72   Pulse Readings from Last 3 Encounters:  04/18/21 84  04/14/21 97  01/13/21 73   Wt Readings from Last 3 Encounters:  04/18/21 121 lb (54.9 kg)  04/14/21 124 lb 8 oz (56.5 kg)  01/13/21 127 lb (57.6 kg)   BMI Readings from Last 3 Encounters:  04/18/21 20.14 kg/m  04/14/21 20.72 kg/m  01/13/21 21.13 kg/m    Assessment/Interventions: Review of patient past medical history, allergies, medications, health status, including review of consultants reports, laboratory and other test data, was performed as part of comprehensive evaluation and provision of chronic care management services.   SDOH:  (Social Determinants of Health) assessments and interventions performed: Yes    CCM Care Plan  Allergies  Allergen Reactions   Sulfamethoxazole-Trimethoprim    Penicillins Rash    Medications Reviewed Today     Reviewed by [name], [facility] (Pharmacist)  on 05/26/21 at 0910  Med List Status: <None>   Medication Order Taking? Sig Documenting Provider Last Dose Status Informant  amLODipine (NORVASC) 10 MG tablet [id number] Yes One tablet by mouth every day for blood pressure. [name], NP Taking Active   Calcium-Magnesium-Vitamin D [phone] MG-MG-UNIT TABS [id number] Yes Take 1 tablet by mouth daily. [provider] Taking Active   cloNIDine (CATAPRES) 0.1 MG tablet [id number]  Take 1 tablet by mouth once daily as needed for systolic blood pressure greater than 170. [name], NP  Active   glimepiride (AMARYL) 2 MG tablet [id number] Yes Take 1 tablet (2 mg total) by mouth 2 (two) times daily with a meal. [name]  K, NP Taking Active   glucose blood (ONE TOUCH ULTRA TEST) test strip [id number] Yes USE AS DIRECTED TEST 3 TIMES DAILY E11.9 [name], NP Taking Active   irbesartan (AVAPRO) 300 MG tablet [id number] Yes Take 1 tablet (300 mg total) by mouth daily. For blood pressure. [name], NP Taking Active   metFORMIN (GLUCOPHAGE) 1000 MG tablet [id number] Yes Take 1 tablet (1,000 mg total) by mouth 2 (two) times daily with a meal. For diabetes. [name], NP Taking Active   metoprolol succinate (TOPROL-XL) 25 MG 24 hr tablet [id number] Yes TAKE 1 TABLET BY MOUTH EVERY DAY FOR BLOOD PRESSURE [name], NP Taking Active   sertraline (ZOLOFT) 25 MG tablet [id number] No Take 1 tablet (25 mg total) by mouth daily. For anxiety.  Patient not taking: Reported on 05/26/2021   [name], NP Not Taking Active   Vitamin D, Ergocalciferol, (DRISDOL) 1.25 MG (50000 UNIT) CAPS capsule [id number] Yes TAKE 1 CAPSULE BY MOUTH ONE TIME PER WEEK for low vitamin D. 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   On apixaban therapy 04/21/2020   Palpitations 04/21/2020   Acute deep vein  thrombosis (DVT) of right tibial vein ([place]) 04/03/2020   Pain due to onychomycosis of toenails of both feet 01/22/2019   Vitamin D deficiency 06/26/2018   Decreased renal function 03/21/2018   Non-traumatic compression fracture of lumbar vertebra with routine healing 08/26/2017   Anxiety and depression 08/26/2017   Type 2 diabetes mellitus ([place]) 04/27/2017   Essential hypertension 04/27/2017   Osteoporosis 04/27/2017    Immunization History  Administered Date(s) Administered   Influenza, High Dose Seasonal PF 05/09/2012   Influenza,inj,Quad PF,6+ Mos 03/30/2019   PFIZER(Purple Top)SARS-COV-2 Vaccination 04/08/2020, 04/29/2020   Pneumococcal Conjugate-13 10/24/2017   Pneumococcal Polysaccharide-23 08/23/2013   Tdap 07/14/2015   Zoster, Live 08/06/2013, 12/08/2014    Conditions to be addressed/monitored:  Hypertension and Diabetes  Care Plan : [place]  Updates made by [name], [place] since 05/26/2021 12:00 AM     Problem: CHL AMB "PATIENT-SPECIFIC PROBLEM"      Long-Range Goal: Disease Management   Start Date: 10/24/2020  This Visit's Progress: On track  Priority: High  Note:    Current Barriers:  Last A1c increased to 8.2% White coat syndrome - HTN  Pharmacist Clinical Goal(s):  Patient will achieve control of diabetes as evidenced by A1c < 8% through collaboration with PharmD and provider.   Interventions: 1:1 collaboration with [name], NP regarding development and update of comprehensive plan of care as evidenced by provider attestation and co-signature Inter-disciplinary care team collaboration (see longitudinal plan of care) Comprehensive medication review performed; medication list updated in electronic medical record  Hypertension (BP goal <140/90) -Controlled - home readings within goal despite elevations in clinic, she thinks she has white coat syndrome. -Current treatment: Irbesartan 300 mg - 1 tablet daily Metoprolol  succinate 25 mg - 1 tablet daily  Amlodipine 10 mg - 1 tablet daily  Clonidine 0.1 mg - 1 tablet for SBP > 170 mmHg (patient has never taken)  -Medications previously tried: none   -Current home readings: 145/75, 69 (this morning), 03/24/2021 - 139/68, 67 pulse -Current exercise habits: walking daily  -Denies hypotensive/hypertensive symptoms; She had her BP monitor calibrated recently. -Counseled to monitor BP at  home weekly, document, and provide log at future appointments -Recommended to continue current medication; Continue to keep home BP logs.  Diabetes (A1c goal <8%) -Controlled - per fasting readings today, last A1c was elevated 8.2% -Sept PCP visit she was encouraged to improve diet due to A1c elevation. Pt states she is adhering to diabetic diet. Her fasting BG readings have been within goal and she does not check post-prandial. She is concerned about her A1c. We discussed BG goals for home monitoring. -Current medications: Glimepiride 2 mg - 1 tablet twice daily (2 tablets with lunch - per patient report) Metformin 1000 mg - 1 tablet twice daily with breakfast and supper (per patient report) -Medications previously tried: none   -Current home glucose readings - checking daily  fasting glucose: 115, 114    post prandial glucose: none -Denies hypoglycemic/hyperglycemic symptoms -She denies any BG < 70 -Reviewed BG goals - 80-130, < 180 -Counseled to check feet daily and get yearly eye exams - up to date  -Recommended to continue current medication; Try checking some sugars after meals with goal < 180.  Patient Goals/Self-Care Activities Patient will:  - check glucose daily, document, and provide at future appointments  - check blood pressure weekly or with symptoms   Other - Pt reports she is coping with anxiety and prefers not to initiate any new medications after recent incident with sertraline.  Follow Up Plan: Telephone follow up appointment with care management team  member scheduled for: 6 months  -CMA call for BG log in 2 weeks       Medication Assistance: None required.  Patient affirms current coverage meets needs.  [facility]'s [id number] [id number]: [phone]: [phone] [facility] Uses pill box? Yes - uses pill packs She uses pill packs -   Reviewed medication routine: Amlodipine 10 mg - 1 daily at breakfast Glimepiride 2 mg - 2 daily at lunch Irbesartan 300 mg 1 daily at bedtime Metformin 1000 mg - 1 daily at breakfast and supper Metoprolol succinate ER 25 mg - 1 daily at bedtime Vitamin D2 50,0000 IU - 1 tablet every Friday at breakfast   OTCs: Calcium/Magnesium- 1 daily at breakfast (She purchases from [employer]) Vitamin C - 1 daily (She purchases from [employer]) PRN: Clonidine for blood pressure She [facility] her testing supplies from [facility] (store brand) - $19   Care Plan and Follow Up Patient Decision:  Patient agrees to Care Plan and Follow-up.  MDebbora Dus PharmD Clinical Pharmacist [phone]

## 2021-05-26 NOTE — Patient Instructions (Signed)
Dear Morgan Bowen,  Below is a summary of the goals we discussed during our follow up appointment on May 26, 2021. Please contact me anytime with questions or concerns.   Visit Information  Patient Care Plan: CCM Pharmacy Care Plan     Problem Identified: CHL AMB "PATIENT-SPECIFIC PROBLEM"      Long-Range Goal: Disease Management   Start Date: 10/24/2020  This Visit's Progress: On track  Priority: High  Note:    Current Barriers:  Last A1c increased to 8.2% White coat syndrome - HTN  Pharmacist Clinical Goal(s):  Patient will achieve control of diabetes as evidenced by A1c < 8% through collaboration with PharmD and provider.   Interventions: 1:1 collaboration with [name], NP regarding development and update of comprehensive plan of care as evidenced by provider attestation and co-signature Inter-disciplinary care team collaboration (see longitudinal plan of care) Comprehensive medication review performed; medication list updated in electronic medical record  Hypertension (BP goal <140/90) -Controlled - home readings within goal despite elevations in clinic, she thinks she has white coat syndrome. -Current treatment: Irbesartan 300 mg - 1 tablet daily Metoprolol succinate 25 mg - 1 tablet daily  Amlodipine 10 mg - 1 tablet daily  Clonidine 0.1 mg - 1 tablet for SBP > 170 mmHg (patient has never taken)  -Medications previously tried: none   -Current home readings: 145/75, 69 (this morning), 03/24/2021 - 139/68, 67 pulse -Current exercise habits: walking daily  -Denies hypotensive/hypertensive symptoms; She had her BP monitor calibrated recently. -Counseled to monitor BP at home weekly, document, and provide log at future appointments -Recommended to continue current medication; Continue to keep home BP logs.  Diabetes (A1c goal <8%) -Controlled - per fasting readings today, last A1c was elevated 8.2% -Sept PCP visit she was encouraged to improve diet due to  A1c elevation. Pt states she is adhering to diabetic diet. Her fasting BG readings have been within goal and she does not check post-prandial. She is concerned about her A1c. We discussed BG goals for home monitoring. -Current medications: Glimepiride 2 mg - 1 tablet twice daily (2 tablets with lunch - per patient report) Metformin 1000 mg - 1 tablet twice daily with breakfast and supper (per patient report) -Medications previously tried: none   -Current home glucose readings - checking daily  fasting glucose: 115, 114    post prandial glucose: none -Denies hypoglycemic/hyperglycemic symptoms -She denies any BG < 70 -Reviewed BG goals - 80-130, < 180 -Counseled to check feet daily and get yearly eye exams - up to date  -Recommended to continue current medication; Try checking some sugars after meals with goal < 180.  Patient Goals/Self-Care Activities Patient will:  - check glucose daily, document, and provide at future appointments  - check blood pressure weekly or with symptoms   Other - Pt reports she is coping with anxiety and prefers not to initiate any new medications after recent incident with sertraline.  Follow Up Plan: Telephone follow up appointment with care management team member scheduled for: 6 months  -CMA call for BG log in 2 weeks      Patient agrees to view care plan in My Chart.  [name], PharmD Clinical Pharmacist [facility] [phone]

## 2021-06-08 DIAGNOSIS — I10 Essential (primary) hypertension: Secondary | ICD-10-CM | POA: Diagnosis not present

## 2021-06-08 DIAGNOSIS — E119 Type 2 diabetes mellitus without complications: Secondary | ICD-10-CM | POA: Diagnosis not present

## 2021-06-10 DIAGNOSIS — M5136 Other intervertebral disc degeneration, lumbar region: Secondary | ICD-10-CM | POA: Diagnosis not present

## 2021-06-10 DIAGNOSIS — M47817 Spondylosis without myelopathy or radiculopathy, lumbosacral region: Secondary | ICD-10-CM | POA: Diagnosis not present

## 2021-06-10 DIAGNOSIS — M545 Low back pain, unspecified: Secondary | ICD-10-CM | POA: Diagnosis not present

## 2021-06-10 DIAGNOSIS — G894 Chronic pain syndrome: Secondary | ICD-10-CM | POA: Diagnosis not present

## 2021-06-10 DIAGNOSIS — M8088XA Other osteoporosis with current pathological fracture, vertebra(e), initial encounter for fracture: Secondary | ICD-10-CM | POA: Diagnosis not present

## 2021-06-10 DIAGNOSIS — M419 Scoliosis, unspecified: Secondary | ICD-10-CM | POA: Diagnosis not present

## 2021-06-16 ENCOUNTER — Other Ambulatory Visit: Payer: Self-pay

## 2021-06-16 ENCOUNTER — Encounter: Payer: Self-pay | Admitting: Family Medicine

## 2021-06-16 ENCOUNTER — Telehealth: Payer: Self-pay

## 2021-06-16 ENCOUNTER — Ambulatory Visit (INDEPENDENT_AMBULATORY_CARE_PROVIDER_SITE_OTHER): Payer: Medicare Other | Admitting: Family Medicine

## 2021-06-16 ENCOUNTER — Telehealth: Payer: Self-pay | Admitting: *Deleted

## 2021-06-16 VITALS — BP 160/78 | HR 119 | Temp 97.0°F | Wt 126.5 lb

## 2021-06-16 DIAGNOSIS — I10 Essential (primary) hypertension: Secondary | ICD-10-CM

## 2021-06-16 DIAGNOSIS — R300 Dysuria: Secondary | ICD-10-CM

## 2021-06-16 DIAGNOSIS — E1169 Type 2 diabetes mellitus with other specified complication: Secondary | ICD-10-CM

## 2021-06-16 DIAGNOSIS — R000 Tachycardia, unspecified: Secondary | ICD-10-CM | POA: Diagnosis not present

## 2021-06-16 LAB — POC URINALSYSI DIPSTICK (AUTOMATED)
Bilirubin, UA: NEGATIVE
Blood, UA: NEGATIVE
Glucose, UA: POSITIVE — AB
Ketones, UA: NEGATIVE
Leukocytes, UA: NEGATIVE
Nitrite, UA: NEGATIVE
Protein, UA: POSITIVE — AB
Spec Grav, UA: 1.02 (ref 1.010–1.025)
Urobilinogen, UA: 0.2 E.U./dL
pH, UA: 6 (ref 5.0–8.0)

## 2021-06-16 NOTE — Telephone Encounter (Signed)
Spoke to patient by telephone and was advised that she thinks that she has a UTI. Patient scheduled for an office visit with Dr. Einar Pheasant today 06/16/21 at 12:20 pm. Patient had a negative covid screening. Patient was given directions to the [employer]

## 2021-06-16 NOTE — Telephone Encounter (Signed)
Called patient's home number and mobile and got her voicemail. Left a message at home number and mobile to call the office back.

## 2021-06-16 NOTE — Telephone Encounter (Signed)
PLEASE NOTE: All timestamps contained within this report are represented as [place] Standard Time. CONFIDENTIALTY NOTICE: This fax transmission is intended only for the addressee. It contains information that is legally privileged, confidential or otherwise protected from use or disclosure. If you are not the intended recipient, you are strictly prohibited from reviewing, disclosing, copying using or disseminating any of this information or taking any action in reliance on or regarding this information. If you have received this fax in error, please notify us immediately by telephone so that we can arrange for its return to [place]. Phone: [phone], Toll-Free: [phone], Fax: [phone] Page: 1 of 2 Call Id: [id number] Fertile Bowen - Client TELEPHONE ADVICE RECORD [facility] Patient Name: Morgan Bowen Gender: Female DOB: 1935-11-14 Age: 85 Y 66 M 3 D Return Phone Number: [phone] (Primary), [phone] (Secondary) Address: [address], [address] City/ State/ Zip: [place]  [address] Client Morgan Bowen - Client Client Site [place] Physician [name] - NP Contact Type Call Who Is Calling Patient / Member / Family / Caregiver Call Type Triage / Clinical Relationship To Patient Self Return Phone Number [phone] (Primary) Chief Complaint Urination Pain Reason for Call Symptomatic / Request for [place] is having burning w/ urination and blood in urine. Caller believes she has UTI. Translation No Nurse Assessment Nurse: Dawna Part, RN, Jarrett Soho Date/Time (Eastern Time): 06/15/2021 9:49:19 PM Confirm and document reason for call. If symptomatic, describe symptoms. ---Caller states she is having pain with urination that started this evening and just a speck of blood on her underwear. Denies fever, any other symptoms. Does the patient  have any new or worsening symptoms? ---Yes Will a triage be completed? ---Yes Related visit to physician within the last 2 weeks? ---Yes Does the PT have any chronic conditions? (i.e. diabetes, asthma, this includes High risk factors for pregnancy, etc.) ---Yes List chronic conditions. ---HTN, Diabetes, Is this a behavioral health or substance abuse call? ---No Guidelines Guideline Title Affirmed Question Affirmed Notes Nurse Date/Time Eilene Ghazi Time) Urination Pain - Female Diabetes mellitus or weak immune system (e.g., HIV positive, cancer chemo, splenectomy, organ transplant, chronic steroids) Riddle, RN, Jarrett Soho 06/15/2021 9:53:03 PM Disp. Time Eilene Ghazi Time) Disposition Final User PLEASE NOTE: All timestamps contained within this report are represented as [place] Standard Time. CONFIDENTIALTY NOTICE: This fax transmission is intended only for the addressee. It contains information that is legally privileged, confidential or otherwise protected from use or disclosure. If you are not the intended recipient, you are strictly prohibited from reviewing, disclosing, copying using or disseminating any of this information or taking any action in reliance on or regarding this information. If you have received this fax in error, please notify us immediately by telephone so that we can arrange for its return to [place]. Phone: [phone], Toll-Free: [phone], Fax: [phone] Page: 2 of 2 Call Id: [id number] 06/15/2021 9:56:09 PM See HCP within 4 Hours (or PCP triage) Yes Dawna Part, RN, Hayes Ludwig Disagree/Comply Comply Caller Understands Yes PreDisposition Call Doctor Care Advice Given Per Guideline SEE HCP (OR PCP TRIAGE) WITHIN 4 HOURS: * IF OFFICE WILL BE CLOSED AND NO PCP (PRIMARY CARE PROVIDER) SECOND-LEVEL TRIAGE: You need to be seen within the next 3 or 4 hours. A nearby Urgent Care Center [facility]) is often a good source of care. Another choice is to go to the ED. Go sooner if  you become worse. * ED: Patients who may  need surgery or hospital admission need to be sent to an ED. So do most patients with serious symptoms or complex medical problems. CALL BACK IF: * You become worse CARE ADVICE given per Urination Pain - Female (Adult) guideline. Referrals [facility] - [facility]

## 2021-06-16 NOTE — Assessment & Plan Note (Signed)
BP elevated. Pt notes white coat HTN. Taking amlodipine 10 mg, irbesartan 300 mg, metoprolol 25 mg XL.

## 2021-06-16 NOTE — Chronic Care Management (AMB) (Addendum)
Chronic Care Management Pharmacy Assistant   Name: Morgan Bowen  MRN: [id number] DOB: March 20, 1936   Reason for Encounter:Diabetes  Disease State/ Medication Adherence and Delivery Coordination   Recent office visits:  06/16/21-Family Medicine-Patient presented for UTI.UA ordered(negative) discussed cardiology referral,advised increase hydration-Will route to PCP given glucose in urine today to see if she would like to see in person-BP elevated. Pt notes white coat HTN. 160/78.  Recent consult visits:  None since last CCM contact  Hospital visits:  None in previous 6 months none since last CCM contact  Medications: Outpatient Encounter Medications as of 06/16/2021  Medication Sig   amLODipine (NORVASC) 10 MG tablet One tablet by mouth every day for blood pressure.   Calcium-Magnesium-Vitamin D [phone] MG-MG-UNIT TABS Take 1 tablet by mouth daily.   cloNIDine (CATAPRES) 0.1 MG tablet Take 1 tablet by mouth once daily as needed for systolic blood pressure greater than 170.   glimepiride (AMARYL) 2 MG tablet Take 1 tablet (2 mg total) by mouth 2 (two) times daily with a meal.   glucose blood (ONE TOUCH ULTRA TEST) test strip USE AS DIRECTED TEST 3 TIMES DAILY E11.9   irbesartan (AVAPRO) 300 MG tablet Take 1 tablet (300 mg total) by mouth daily. For blood pressure.   metFORMIN (GLUCOPHAGE) 1000 MG tablet Take 1 tablet (1,000 mg total) by mouth 2 (two) times daily with a meal. For diabetes.   metoprolol succinate (TOPROL-XL) 25 MG 24 hr tablet TAKE 1 TABLET BY MOUTH EVERY DAY FOR BLOOD PRESSURE   Vitamin D, Ergocalciferol, (DRISDOL) 1.25 MG (50000 UNIT) CAPS capsule TAKE 1 CAPSULE BY MOUTH ONE TIME PER WEEK for low vitamin D.   No facility-administered encounter medications on file as of 06/16/2021.     Recent Relevant Labs: Lab Results  Component Value Date/Time   HGBA1C 8.2 (H) 04/15/2021 08:19 AM   HGBA1C 7.3 (A) 01/13/2021 10:33 AM   HGBA1C 8.0 (A) 10/07/2020 10:52 AM    HGBA1C 7.7 (H) 03/31/2020 08:24 AM    Kidney Function Lab Results  Component Value Date/Time   CREATININE 1.12 (H) 04/18/2021 05:54 PM   CREATININE 0.98 04/15/2021 08:19 AM   GFR 53.50 (L) 04/15/2021 08:19 AM   GFRNONAA 49 (L) 04/18/2021 05:54 PM   GFRAA >60 04/13/2020 09:37 PM     Contacted patient on 06/23/21 to discuss diabetes disease state.   Current antihyperglycemic regimen:  Glimepiride 2 mg - 1 tablet twice daily (2 tablets with lunch - per patient report) Metformin 1000 mg - 1 tablet twice daily with breakfast and supper (per patient report)    Patient verbally confirms she is taking the above medications as directed. Yes  What diet changes have been made to improve diabetes control?Patient reports smaller meals portions  What recent interventions/DTPs have been made to improve glycemic control:   05/26/21-begin checking BG's 2 hours after meals with goal <180   Have there been any recent hospitalizations or ED visits since last visit with Morgan Bowen? Yes  Patient denies hypoglycemic symptoms, including Pale, Sweaty, Shaky, Hungry, Nervous/irritable, and Vision changes  Patient denies hyperglycemic symptoms, including blurry vision, excessive thirst, fatigue, polyuria, and weakness  How often are you checking your blood sugar? twice daily  What are your blood sugars ranging?  Fasting: 104,108,125 After meals: ( 2 hours) 251,241,218   During the week, how often does your blood glucose drop below 70? Never  Are you checking your feet daily/regularly? Yes  Adherence Review: Is the patient currently on  a STATIN medication? No Is the patient currently on ACE/ARB medication? Yes Does the patient have >5 day gap between last estimated fill dates? No  Care Gaps: Annual wellness visit in last year? No Most recent A1C reading:  8.2  04/15/21 Most Recent BP reading:  160/78  119-P  (office setting 06/16/21)  Last eye exam / retinopathy screening:10/07/20 Last diabetic foot  exam: 01/13/21  Counseled patient on importance of annual eye and foot exam.  Up to date with screenings  Star Rating Drugs:  Medication:  Last Fill: Day Supply Irbesartan 300mg  03/30/21 90 Metformin 1000mg  03/27/21 90 Glimepiride 2mg  03/27/21 90  No appointments scheduled within the next 30 days. BP Readings from Last 3 Encounters:  06/16/21 (!) 160/78  05/26/21 (!) 145/75  04/18/21 (!) 163/53    Lab Results  Component Value Date   HGBA1C 8.2 (H) 04/15/2021      No OVs, Consults, or hospital visits since last care coordination call / Pharmacist visit. No medication changes indicated   Last adherence delivery date:04/02/21      Patient is due for next adherence delivery on: 07/01/21  Spoke with patient on 06/23/21 reviewed medications and coordinated delivery.  This delivery to include: Vials  90 Days   No safety caps  VIAL medications: Vitamin D 12.50 mcg (50,000 units) - 1 tablet every Friday (breakfast) Glimepiride 2mg  - 2 tablets daily (2- lunch) Amlodipine 10mg -  1 tablet daily (breakfast) Irbesartan 300mg - 1 tablet daily (bedtime) Metoprolol 25mg - 1 tablet daily (bedtime) Metformin 1000mg  1 tablet twice daily (1 breakfast, 1 evening meal)  Patient declined the following medications this month: none   Any concerns about your medications? No  How often do you forget or accidentally miss a dose? Never  Do you use a pillbox? Yes  Is patient in packaging No  No refill request needed.  Confirmed delivery date of 07/01/21, advised patient that pharmacy will contact them the morning of delivery.  Recent blood pressure readings are as follows:06/23/21-147/65  72-P   Recent blood glucose readings are as follows:  2 hour after eating log Fasting: 06/23/21-  123           Fasting 06/20/21-104  2 hours-251                        06/19/21-108    241              06/17/21 -125    Morgan Bowen, Morgan Bowen notified  Morgan Bowen, [place] Assistant [phone]  I have reviewed the care management and care coordination activities outlined in this encounter and I am certifying that I agree with the content of this note. No further action required.  [name], PharmD Clinical Pharmacist [facility] at [facility] [phone]

## 2021-06-16 NOTE — Assessment & Plan Note (Signed)
Lab Results  Component Value Date   HGBA1C 8.2 (H) 04/15/2021   Pt working on diet and reviewed chart with plan to f/u labs in 3 months. Will route to PCP given glucose in urine today to see if she would like to see in person. Cont metformin 1000 mg bid and glimepiridie 2 mg bid

## 2021-06-16 NOTE — Patient Instructions (Addendum)
#  Urine symptoms - no sign of infection on the initial test - can send for culture - you do have sugar in your urine - this may have caused some frequency.   I would recommend you follow-up with Anda Kraft sooner than planned to check in on the heart rate issue  Avoid bladder irritants - soda, caffeine, spicy foods - for a few days Continue to push water

## 2021-06-16 NOTE — Telephone Encounter (Signed)
Refill requested for Amlodipine 10 mg

## 2021-06-16 NOTE — Progress Notes (Signed)
Subjective:     Morgan Bowen is a 85 y.o. female presenting for Urinary Frequency (Started last night ), Dysuria (With a very little sign of blood), and Urinary Urgency     Urinary Frequency  The problem occurs every urination. The quality of the pain is described as burning. There has been no fever. Associated symptoms include frequency, hematuria and urgency. Pertinent negatives include no chills, flank pain, hesitancy, nausea or vomiting. Treatments tried: vinegar and water.  Dysuria  This is a new problem. The current episode started yesterday. Associated symptoms include frequency, hematuria and urgency. Pertinent negatives include no chills, flank pain, hesitancy, nausea or vomiting.   #hr - was 80 bpm this morning - reviewed ER visit from 04/18/2021 - thought 2/2 to sertraline  Review of Systems  Constitutional:  Negative for chills.  Gastrointestinal:  Negative for abdominal pain, nausea and vomiting.  Genitourinary:  Positive for dysuria, frequency, hematuria and urgency. Negative for flank pain and hesitancy.  Neurological:  Negative for dizziness and headaches.    Social History   Tobacco Use  Smoking Status Never  Smokeless Tobacco Never        Objective:    BP Readings from Last 3 Encounters:  06/16/21 (!) 160/78  05/26/21 (!) 145/75  04/18/21 (!) 163/53   Wt Readings from Last 3 Encounters:  06/16/21 126 lb 8 oz (57.4 kg)  04/18/21 121 lb (54.9 kg)  04/14/21 124 lb 8 oz (56.5 kg)     BP (!) 160/78   Pulse (!) 119   Temp (!) 97 F (36.1 C) (Temporal)   Wt 126 lb 8 oz (57.4 kg)   SpO2 98%   BMI 21.05 kg/m    Physical Exam Constitutional:      General: She is not in acute distress.    Appearance: She is well-developed. She is not diaphoretic.  HENT:     Head: Normocephalic and atraumatic.  Eyes:     Conjunctiva/sclera: Conjunctivae normal.  Cardiovascular:     Rate and Rhythm: Regular rhythm. Tachycardia present.     Heart sounds: Normal  heart sounds. No murmur heard. Pulmonary:     Effort: Pulmonary effort is normal. No respiratory distress.     Breath sounds: Normal breath sounds. No wheezing.  Abdominal:     General: Bowel sounds are normal. There is no distension.     Palpations: Abdomen is soft.     Tenderness: There is no abdominal tenderness. There is no right CVA tenderness, left CVA tenderness or guarding.  Musculoskeletal:     Cervical back: Neck supple.  Skin:    General: Skin is warm and dry.  Neurological:     Mental Status: She is alert.     UA: positive for glucose, neg LE, nitrites     Assessment & Plan:   Problem List Items Addressed This Visit       Cardiovascular and Mediastinum   Essential hypertension    BP elevated. Pt notes white coat HTN. Taking amlodipine 10 mg, irbesartan 300 mg, metoprolol 25 mg XL.         Endocrine   Type 2 diabetes mellitus with other specified complication [facility])    Lab Results  Component Value Date   HGBA1C 8.2 (H) 04/15/2021  Pt working on diet and reviewed chart with plan to f/u labs in 3 months. Will route to PCP given glucose in urine today to see if she would like to see in person. Cont metformin 1000 mg  bid and glimepiridie 2 mg bid        Other   Sinus tachycardia    Reviewed ER evaluation with sinus tachycardia. Pt notes normal at home and elevated in periods of anxiety (office visits). Discussed option of cardiology but she declined as not symptomatic and better control at home since stopping sertraline. Advised pcp f/u if tachycardia returns. May consider increase in metoprolol      Other Visit Diagnoses     Dysuria    -  Primary   Relevant Orders   POCT Urinalysis Dipstick (Automated) (Completed)      Dysuria - UA w/o infection and symptoms improving. Offered sending for culture but pt declined. Discussed that given prior Ucx were negative. Advised hydration.   Return in about 1 month (around 07/16/2021) for with pcp.  Lesleigh Noe,  MD  This visit occurred during the SARS-CoV-2 public health emergency.  Safety protocols were in place, including screening questions prior to the visit, additional usage of staff PPE, and extensive cleaning of exam room while observing appropriate contact time as indicated for disinfecting solutions.

## 2021-06-16 NOTE — Assessment & Plan Note (Addendum)
Reviewed ER evaluation with sinus tachycardia. Pt notes normal at home and elevated in periods of anxiety (office visits). Discussed option of cardiology but she declined as not symptomatic and better control at home since stopping sertraline. Advised pcp f/u if tachycardia returns. May consider increase in metoprolol

## 2021-06-17 MED ORDER — AMLODIPINE BESYLATE 10 MG PO TABS: ORAL_TABLET | ORAL | 2 refills | Status: DC

## 2021-06-17 NOTE — Telephone Encounter (Signed)
See note from 11/8

## 2021-06-17 NOTE — Telephone Encounter (Signed)
Refills sent to pharmacy. 

## 2021-06-18 ENCOUNTER — Other Ambulatory Visit: Payer: Self-pay | Admitting: Primary Care

## 2021-06-18 DIAGNOSIS — M810 Age-related osteoporosis without current pathological fracture: Secondary | ICD-10-CM

## 2021-06-18 DIAGNOSIS — I10 Essential (primary) hypertension: Secondary | ICD-10-CM

## 2021-06-29 ENCOUNTER — Ambulatory Visit (INDEPENDENT_AMBULATORY_CARE_PROVIDER_SITE_OTHER): Payer: Medicare Other | Admitting: Podiatry

## 2021-06-29 ENCOUNTER — Encounter: Payer: Self-pay | Admitting: Podiatry

## 2021-06-29 ENCOUNTER — Other Ambulatory Visit: Payer: Self-pay

## 2021-06-29 DIAGNOSIS — M79674 Pain in right toe(s): Secondary | ICD-10-CM | POA: Diagnosis not present

## 2021-06-29 DIAGNOSIS — E119 Type 2 diabetes mellitus without complications: Secondary | ICD-10-CM | POA: Diagnosis not present

## 2021-06-29 DIAGNOSIS — B351 Tinea unguium: Secondary | ICD-10-CM

## 2021-06-29 DIAGNOSIS — M79675 Pain in left toe(s): Secondary | ICD-10-CM | POA: Diagnosis not present

## 2021-06-29 DIAGNOSIS — M129 Arthropathy, unspecified: Secondary | ICD-10-CM | POA: Diagnosis not present

## 2021-06-29 DIAGNOSIS — Z7901 Long term (current) use of anticoagulants: Secondary | ICD-10-CM | POA: Diagnosis not present

## 2021-06-29 NOTE — Progress Notes (Signed)
Complaint:  Visit Type: This patient presents to the office for at risk foot care patient requires this care by professional since  this patient will be at risk  due to having diabetes.  Patient is unable to cut her own toenails since she cannot reach down.  She presents for at risk foot care today.    Podiatric Exam: Vascular: dorsalis pedis and posterior tibial pulses are palpable bilateral. Capillary return is immediate. Temperature gradient is WNL. Skin turgor WNL  Sensorium: Normal Semmes Weinstein monofilament test. Normal tactile sensation bilaterally. Nail Exam: Pt has thick disfigured discolored nails with subungual debris noted bilateral entire nail hallux.  Nails are thick disfigured and discolored hallux toenails. Ulcer Exam: There is no evidence of ulcer or pre-ulcerative changes or infection. Orthopedic Exam: Muscle tone and strength are WNL. No limitations in general ROM. No crepitus or effusions noted. Foot type and digits show no abnormalities. Bony prominences are unremarkable. Skin: No Porokeratosis. No infection or ulcers  Diagnosis:  Onychomycosis, , Pain in right toe, pain in left toes  Midfoot arthritis.  Treatment & Plan Procedures and Treatment: Consent by patient was obtained for treatment procedures.   Debridement of mycotic and hypertrophic toenails, 1 through 5 bilateral and clearing of subungual debris. No ulceration, no infection noted.  Told the patient to return for periodic foot evaluation to reduce potential  at risk complications  Patient was concerned about two bony prominences on the top of both feet.  Discussed this condition with this patient Return Visit-Office Procedure: Patient instructed to return to the office for a follow up visit 3 months for continued evaluation and treatment.    Gardiner Barefoot DPM

## 2021-07-08 DIAGNOSIS — M5136 Other intervertebral disc degeneration, lumbar region: Secondary | ICD-10-CM | POA: Diagnosis not present

## 2021-07-08 DIAGNOSIS — G894 Chronic pain syndrome: Secondary | ICD-10-CM | POA: Diagnosis not present

## 2021-07-08 DIAGNOSIS — M545 Low back pain, unspecified: Secondary | ICD-10-CM | POA: Diagnosis not present

## 2021-07-12 ENCOUNTER — Other Ambulatory Visit: Payer: Self-pay | Admitting: Primary Care

## 2021-07-12 DIAGNOSIS — F419 Anxiety disorder, unspecified: Secondary | ICD-10-CM

## 2021-07-14 ENCOUNTER — Other Ambulatory Visit: Payer: Self-pay | Admitting: Primary Care

## 2021-07-14 DIAGNOSIS — E1169 Type 2 diabetes mellitus with other specified complication: Secondary | ICD-10-CM

## 2021-07-20 DIAGNOSIS — B309 Viral conjunctivitis, unspecified: Secondary | ICD-10-CM | POA: Diagnosis not present

## 2021-07-23 ENCOUNTER — Telehealth: Payer: Self-pay | Admitting: Primary Care

## 2021-07-23 NOTE — Telephone Encounter (Signed)
Patient has a lab appt 07/27/2021, there are no orders in.

## 2021-07-23 NOTE — Telephone Encounter (Signed)
Good afternoon! Lab orders were placed on 07/14/21 for POC A1C and are in the chart under labs.  Do you guys have POC A1C capability?

## 2021-07-27 ENCOUNTER — Other Ambulatory Visit: Payer: Medicare Other

## 2021-07-27 ENCOUNTER — Ambulatory Visit (INDEPENDENT_AMBULATORY_CARE_PROVIDER_SITE_OTHER): Payer: Medicare Other

## 2021-07-27 DIAGNOSIS — E1169 Type 2 diabetes mellitus with other specified complication: Secondary | ICD-10-CM | POA: Diagnosis not present

## 2021-07-27 LAB — POCT GLYCOSYLATED HEMOGLOBIN (HGB A1C): Hemoglobin A1C: 7.1 % — AB (ref 4.0–5.6)

## 2021-07-27 NOTE — Progress Notes (Signed)
Pt came to [facility] for POC A1C. She states she is taking her medications as directed.

## 2021-08-18 DIAGNOSIS — Z0000 Encounter for general adult medical examination without abnormal findings: Secondary | ICD-10-CM | POA: Diagnosis not present

## 2021-08-19 DIAGNOSIS — M5136 Other intervertebral disc degeneration, lumbar region: Secondary | ICD-10-CM | POA: Diagnosis not present

## 2021-08-19 DIAGNOSIS — G894 Chronic pain syndrome: Secondary | ICD-10-CM | POA: Diagnosis not present

## 2021-08-19 DIAGNOSIS — M545 Low back pain, unspecified: Secondary | ICD-10-CM | POA: Diagnosis not present

## 2021-09-17 ENCOUNTER — Other Ambulatory Visit: Payer: Self-pay | Admitting: Primary Care

## 2021-09-17 DIAGNOSIS — E119 Type 2 diabetes mellitus without complications: Secondary | ICD-10-CM

## 2021-09-17 DIAGNOSIS — M810 Age-related osteoporosis without current pathological fracture: Secondary | ICD-10-CM

## 2021-09-18 ENCOUNTER — Telehealth: Payer: Self-pay

## 2021-09-18 NOTE — Progress Notes (Addendum)
Chronic Care Management Pharmacy Assistant   Name: Morgan Bowen  MRN: [id number] DOB: 07/24/1936  Reason for Encounter: CCM (Medication Adherence and Delivery Coordination)   Recent office visits:  07/27/2021 - Morgan Bowen, CMA - Patient presented for diabetes. Labs: A1c.   Recent consult visits:  08/19/2021 - Morgan Sabal, NP - Patient presented for low back pain. No other information.  07/08/2021 Morgan Sabal, NP - Patient presented for low back pain. No other information.  06/29/2021 - Morgan Bowen, DPM - Podiatry - Patient presented for pain in toenails of both feet. Debridement of mycotic and hypertrophic toenails, 1 through 5 bilateral and clearing of subungual debris.  Hospital visits:  None since last CCM contact  Medications: Outpatient Encounter Medications as of 09/18/2021  Medication Sig   amLODipine (NORVASC) 10 MG tablet One tablet by mouth every day for blood pressure.   Calcium-Magnesium-Vitamin D [phone] MG-MG-UNIT TABS Take 1 tablet by mouth daily.   cloNIDine (CATAPRES) 0.1 MG tablet Take 1 tablet by mouth once daily as needed for systolic blood pressure greater than 170.   glimepiride (AMARYL) 2 MG tablet Take 1 tablet (2 mg total) by mouth 2 (two) times daily with a meal.   glucose blood (ONE TOUCH ULTRA TEST) test strip USE AS DIRECTED TEST 3 TIMES DAILY E11.9   irbesartan (AVAPRO) 300 MG tablet Take 1 tablet (300 mg total) by mouth daily. For blood pressure.   metFORMIN (GLUCOPHAGE) 1000 MG tablet TAKE ONE TABLET BY MOUTH TWICE DAILY with A meal FOR diabetes   metoprolol succinate (TOPROL-XL) 25 MG 24 hr tablet TAKE ONE TABLET BY MOUTH ONCE DAILY FOR blood pressure   Vitamin D, Ergocalciferol, (DRISDOL) 1.25 MG (50000 UNIT) CAPS capsule TAKE ONE CAPSULE BY MOUTH ONCE A WEEK FOR low vitamin d   No facility-administered encounter medications on file as of 09/18/2021.   BP Readings from Last 3 Encounters:  06/16/21 (!) 160/78  05/26/21 (!) 145/75   04/18/21 (!) 163/53    Lab Results  Component Value Date   HGBA1C 7.1 (A) 07/27/2021    Recent OV, Consult or Hospital visit:  No medication changes indicated  Last adherence delivery date:       Patient is due for next adherence delivery on: 07/01/2021  Spoke with patient on 09/21/2021 reviewed medications and coordinated delivery.  This delivery to include: Vials  90 Days   No safety caps  VIAL medications: Irbesartan 300mg - 1 tablet daily (bedtime) Metoprolol 25mg - 1 tablet daily (bedtime) Glimepiride 2mg  - 2 tablets daily (2- lunch) Amlodipine 10mg -  1 tablet daily (breakfast) Metformin 1000mg  1 tablet twice daily (1 breakfast, 1 evening meal) Vitamin D 12.50 mcg (50,000 units) - 1 tablet every Friday (breakfast)   Patient declined the following medications this month: none  Any concerns about your medications? No  How often do you forget or accidentally miss a dose? Rarely  Do you use a pillbox? Yes  Is patient in packaging No  Refills requested from providers include: Metformin 1000mg  1 tablet twice daily (1 breakfast, 1 evening meal) Vitamin D 12.50 mcg (50,000 units) - 1 tablet every Friday (breakfast)  Confirmed delivery date of 09/30/2021, advised patient that pharmacy will contact them the morning of delivery.   Recent blood pressure readings are as follows: Patient was unable to provide her readings. I asked patient to take her blood pressure and blood sugar daily starting today. I advised her I would call back on Friday 09/25/21 for her log.  Patient understood and agreed.   Annual wellness visit in last year? No 04/01/2020 Most Recent BP reading: 160/78 on 06/16/2021  If Diabetic: Most recent A1C reading: 7.1 on 07/27/2021 Last eye exam / retinopathy screening: Up to date Last diabetic foot exam: Up to date  Morgan Bowen, CPP notified  Morgan Bowen, [place] Assistant [phone]  I have reviewed the care management and care  coordination activities outlined in this encounter and I am certifying that I agree with the content of this note. No further action required.  Morgan Bowen, PharmD Clinical Pharmacist [facility] at [facility] [phone]

## 2021-10-01 ENCOUNTER — Ambulatory Visit (INDEPENDENT_AMBULATORY_CARE_PROVIDER_SITE_OTHER): Payer: Medicare Other | Admitting: Podiatry

## 2021-10-01 ENCOUNTER — Other Ambulatory Visit: Payer: Self-pay

## 2021-10-01 ENCOUNTER — Encounter: Payer: Self-pay | Admitting: Podiatry

## 2021-10-01 DIAGNOSIS — B351 Tinea unguium: Secondary | ICD-10-CM

## 2021-10-01 DIAGNOSIS — M79675 Pain in left toe(s): Secondary | ICD-10-CM

## 2021-10-01 DIAGNOSIS — Z7901 Long term (current) use of anticoagulants: Secondary | ICD-10-CM

## 2021-10-01 DIAGNOSIS — M79674 Pain in right toe(s): Secondary | ICD-10-CM

## 2021-10-01 DIAGNOSIS — D689 Coagulation defect, unspecified: Secondary | ICD-10-CM

## 2021-10-01 DIAGNOSIS — E119 Type 2 diabetes mellitus without complications: Secondary | ICD-10-CM

## 2021-10-01 NOTE — Progress Notes (Signed)
Complaint:  Visit Type: This patient presents to the office for at risk foot care patient requires this care by professional since  this patient will be at risk  due to having diabetes.  Patient is unable to cut her own toenails since she cannot reach down.  She presents for at risk foot care today.    Podiatric Exam: Vascular: dorsalis pedis and posterior tibial pulses are palpable bilateral. Capillary return is immediate. Temperature gradient is WNL. Skin turgor WNL  Sensorium: Normal Semmes Weinstein monofilament test. Normal tactile sensation bilaterally. Nail Exam: Pt has thick disfigured discolored nails with subungual debris noted bilateral entire nail hallux.  Nails are thick disfigured and discolored hallux toenails. Ulcer Exam: There is no evidence of ulcer or pre-ulcerative changes or infection. Orthopedic Exam: Muscle tone and strength are WNL. No limitations in general ROM. No crepitus or effusions noted. Foot type and digits show no abnormalities. Bony prominences are unremarkable. Skin: No Porokeratosis. No infection or ulcers  Diagnosis:  Onychomycosis, , Pain in right toe, pain in left toes  Midfoot arthritis.  Treatment & Plan Procedures and Treatment: Consent by patient was obtained for treatment procedures.   Debridement of mycotic and hypertrophic toenails, 1 through 5 bilateral and clearing of subungual debris. No ulceration, no infection noted.  Told the patient to return for periodic foot evaluation to reduce potential  at risk complications   Return Visit-Office Procedure: Patient instructed to return to the office for a follow up visit 3 months for continued evaluation and treatment.    Frazer [place]ville DPM 

## 2021-10-13 ENCOUNTER — Other Ambulatory Visit: Payer: Self-pay

## 2021-10-13 ENCOUNTER — Ambulatory Visit (INDEPENDENT_AMBULATORY_CARE_PROVIDER_SITE_OTHER): Payer: Medicare Other | Admitting: Primary Care

## 2021-10-13 ENCOUNTER — Encounter: Payer: Self-pay | Admitting: Primary Care

## 2021-10-13 VITALS — BP 130/60 | HR 78 | Temp 98.6°F | Ht 65.0 in | Wt 124.0 lb

## 2021-10-13 DIAGNOSIS — I10 Essential (primary) hypertension: Secondary | ICD-10-CM

## 2021-10-13 DIAGNOSIS — E1169 Type 2 diabetes mellitus with other specified complication: Secondary | ICD-10-CM

## 2021-10-13 LAB — POCT GLYCOSYLATED HEMOGLOBIN (HGB A1C): Hemoglobin A1C: 7.4 % — AB (ref 4.0–5.6)

## 2021-10-13 NOTE — Patient Instructions (Signed)
Increase walking and exercise this Summer. ? ?Continue to work on [employer]. ? ?It was a pleasure to see you today! ? ? ?

## 2021-10-13 NOTE — Progress Notes (Signed)
? ?Subjective:  ? ? Patient ID: Morgan Bowen, female    DOB: [birth year], 86 y.o.   MRN: [id number] ? ?HPI ? ?Morgan Bowen is a very pleasant 86 y.o. female with a history of type 2 diabetes, osteoporosis, anxiety and depression, decreased renal function, palpitations who presents today for follow-up of diabetes. ? ?Current medications include: Metformin 1000 mg twice daily, glimepiride 2 mg twice daily. ? ?She is checking her blood glucose several times weekly and is getting readings ranging 110's  ? ?Last A1C: 7.1 in December 2022, 7.4 today ?Last Eye Exam: UTD per patient.  ?Last Foot Exam: UTD ?Pneumonia Vaccination: 2019 ?Urine Microalbumin: None.  Managed on ARB ?Statin: None, patient has declined ? ?Dietary changes since last visit: Overall healthy diet.  ? ? ?Exercise: Some walking.  ? ?BP Readings from Last 3 Encounters:  ?10/13/21 130/60  ?06/16/21 (!) 160/78  ?05/26/21 (!) 145/75  ? ? ? ? ?Review of Systems  ?Eyes:  Negative for visual disturbance.  ?Respiratory:  Negative for shortness of breath.   ?Cardiovascular:  Negative for chest pain.  ?Neurological:  Negative for dizziness and headaches.  ? ?   ? ? ?Past Medical History:  ?Diagnosis Date  ? Acute cystitis 05/23/2018  ? Anxiety   ? Arthritis   ? Cellulitis 12/13/2018  ? Chickenpox   ? Diabetes mellitus without complication ([place])   ? GERD (gastroesophageal reflux disease)   ? Hypertension   ? Osteoporosis   ? Type 2 diabetes mellitus ([place])   ? Urinary tract infection   ? ? ?Social History  ? ?Socioeconomic History  ? Marital status: Widowed  ?  Spouse name: Not on file  ? Number of children: Not on file  ? Years of education: Not on file  ? Highest education level: Not on file  ?Occupational History  ? Not on file  ?Tobacco Use  ? Smoking status: Never  ? Smokeless tobacco: Never  ?Vaping Use  ? Vaping Use: Never used  ?Substance and Sexual Activity  ? Alcohol use: No  ? Drug use: No  ? Sexual activity: Not Currently  ?Other Topics Concern  ? Not on file   ?Social History Narrative  ? ** Merged History Encounter **  ?    ? Widowed. ?Moved from [place]. ?1 son, 3 grandchildren. ?Retired. Once worked as a Agricultural engineer. ?  ? ?Social Determinants of Health  ? ?Financial Resource Strain: Low Risk   ? Difficulty of Paying Living Expenses: Not very hard  ?Food Insecurity: Not on file  ?Transportation Needs: Not on file  ?Physical Activity: Not on file  ?Stress: Not on file  ?Social Connections: Not on file  ?Intimate Partner Violence: Not on file  ? ? ?Past Surgical History:  ?Procedure Laterality Date  ? ABDOMINAL HYSTERECTOMY  1984  ? ABDOMINAL HYSTERECTOMY    ? APPENDECTOMY  1958  ? CHOLECYSTECTOMY    ? GALLBLADDER SURGERY    ? MANDIBLE FRACTURE SURGERY  07/13/2016  ? TONSILLECTOMY  1958  ? TONSILLECTOMY    ? WRIST FRACTURE SURGERY Right   ? 2000  ? ? ?Family History  ?Problem Relation Age of Onset  ? Hypertension Mother   ? Stroke Mother   ? Stroke Father   ? Hypertension Father   ? Hypertension Sister   ? Kidney disease Sister   ? Breast cancer Neg Hx   ? ? ?Allergies  ?Allergen Reactions  ? Sulfamethoxazole-Trimethoprim   ? Penicillins Rash  ? ? ?Current  Outpatient Medications on File Prior to Visit  ?Medication Sig Dispense Refill  ? amLODipine (NORVASC) 10 MG tablet One tablet by mouth every day for blood pressure. 90 tablet 2  ? Calcium-Magnesium-Vitamin D [phone] MG-MG-UNIT TABS Take 1 tablet by mouth daily.    ? cloNIDine (CATAPRES) 0.1 MG tablet Take 1 tablet by mouth once daily as needed for systolic blood pressure greater than 170. 30 tablet 0  ? diclofenac Sodium (VOLTAREN) 1 % GEL Apply 1 application topically 4 (four) times daily.    ? glimepiride (AMARYL) 2 MG tablet Take 1 tablet (2 mg total) by mouth 2 (two) times daily with a meal. 180 tablet 3  ? glucose blood (ONE TOUCH ULTRA TEST) test strip USE AS DIRECTED TEST 3 TIMES DAILY E11.9 100 each 2  ? irbesartan (AVAPRO) 300 MG tablet Take 1 tablet (300 mg total) by mouth daily. For blood pressure.  90 tablet 2  ? metFORMIN (GLUCOPHAGE) 1000 MG tablet TAKE ONE TABLET BY MOUTH TWICE DAILY with A meal FOR diabetes 180 tablet 0  ? metoprolol succinate (TOPROL-XL) 25 MG 24 hr tablet TAKE ONE TABLET BY MOUTH ONCE DAILY FOR blood pressure 90 tablet 2  ? Vitamin D, Ergocalciferol, (DRISDOL) 1.25 MG (50000 UNIT) CAPS capsule TAKE ONE CAPSULE BY MOUTH ONCE A WEEK FOR low vitamin d 12 capsule 1  ? celecoxib (CELEBREX) 100 MG capsule Take 100 mg by mouth daily. (Patient not taking: Reported on 10/13/2021)    ? ?No current facility-administered medications on file prior to visit.  ? ? ?BP 130/60   Pulse 78   Temp 98.6 ?F (37 ?C) (Oral)   Ht '5\' 5"'$  (1.651 m)   Wt 124 lb (56.2 kg)   SpO2 98%   BMI 20.63 kg/m?  ?Objective:  ? Physical Exam ?Cardiovascular:  ?   Rate and Rhythm: Normal rate and regular rhythm.  ?Pulmonary:  ?   Effort: Pulmonary effort is normal.  ?   Breath sounds: Normal breath sounds.  ?Musculoskeletal:  ?   Cervical back: Neck supple.  ?Skin: ?   General: Skin is warm and dry.  ?Psychiatric:     ?   Mood and Affect: Mood normal.  ? ? ? ? ? ?   ?Assessment & Plan:  ? ? ? ? ?This visit occurred during the SARS-CoV-2 public health emergency.  Safety protocols were in place, including screening questions prior to the visit, additional usage of staff PPE, and extensive cleaning of exam room while observing appropriate contact time as indicated for disinfecting solutions.  ?

## 2021-10-13 NOTE — Assessment & Plan Note (Signed)
Controlled! ? ?Continue irbesartan 300 mg daily, metoprolol succinate 25 mg daily, amlodipine 10 mg daily. ? ?Continue to monitor.  ?

## 2021-10-13 NOTE — Assessment & Plan Note (Signed)
Controlled with A1C today of 7.4! ? ?Continue glimepiride 2 mg BID, metformin 1000 mg BID.  ? ?Managed on ARB. ?Declines statin. ?Pneumonia vaccine UTD. ? ?Follow up in 6 months. ?

## 2021-10-24 ENCOUNTER — Encounter: Payer: Self-pay | Admitting: Emergency Medicine

## 2021-10-24 ENCOUNTER — Other Ambulatory Visit: Payer: Self-pay

## 2021-10-24 ENCOUNTER — Ambulatory Visit
Admission: EM | Admit: 2021-10-24 | Discharge: 2021-10-24 | Disposition: A | Discharge status: Home / Self Care | Payer: Medicare Other | Attending: Family Medicine | Admitting: Family Medicine

## 2021-10-24 DIAGNOSIS — N3001 Acute cystitis with hematuria: Secondary | ICD-10-CM | POA: Insufficient documentation

## 2021-10-24 DIAGNOSIS — R300 Dysuria: Secondary | ICD-10-CM | POA: Diagnosis not present

## 2021-10-24 LAB — POCT URINALYSIS DIP (MANUAL ENTRY)
Bilirubin, UA: NEGATIVE
Glucose, UA: 100 mg/dL — AB
Ketones, POC UA: NEGATIVE mg/dL
Nitrite, UA: NEGATIVE
Spec Grav, UA: 1.015 (ref 1.010–1.025)
Urobilinogen, UA: 0.2 E.U./dL
pH, UA: 6.5 (ref 5.0–8.0)

## 2021-10-24 MED ORDER — CEPHALEXIN 500 MG PO CAPS: 500.0000 mg | ORAL_CAPSULE | Freq: Two times a day (BID) | ORAL | 0 refills | Status: DC

## 2021-10-24 NOTE — Discharge Instructions (Signed)
Start medication take as directed.  Hydrate well with fluids.  I have sent your urine out to be cultured if any changes in treatment is warranted someone from our office will contact you. ?

## 2021-10-24 NOTE — ED Triage Notes (Signed)
Pt c/o dysuria and urinary pressure since yesterday. ?

## 2021-10-24 NOTE — ED Provider Notes (Signed)
?[facility] ? ? ? ?CSN: [id number] ?Arrival date & time: 10/24/21  0941 ? ? ?  ? ?History   ?Chief Complaint ?Chief Complaint  ?Patient presents with  ? Dysuria  ? ? ?HPI ?Morgan Bowen is a 86 y.o. female.  ? ?HPI ?Morgan Bowen is a 86 y.o. female with a history of type 2 diabetes, recurrent UTI, hypertension presents for evaluation of urinary frequency, urgency and dysuria x 1 days, without flank pain, fever, or chills. Uncertain of prior urine pathology. No LMP recorded. Patient has had a hysterectomy.  ?Past Medical History:  ?Diagnosis Date  ? Acute cystitis 05/23/2018  ? Anxiety   ? Arthritis   ? Cellulitis 12/13/2018  ? Chickenpox   ? Diabetes mellitus without complication ([place])   ? GERD (gastroesophageal reflux disease)   ? Hypertension   ? Osteoporosis   ? Type 2 diabetes mellitus ([place])   ? Urinary tract infection   ? ? ?Patient Active Problem List  ? Diagnosis Date Noted  ? Sinus tachycardia 06/16/2021  ? 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 On apixaban therapy 04/21/2020  ? Palpitations 04/21/2020  ? Acute deep vein thrombosis (DVT) of right tibial vein ([place]) 04/03/2020  ? Pain due to onychomycosis of toenails of both feet 01/22/2019  ? Vitamin D deficiency 06/26/2018  ? Decreased renal function 03/21/2018  ? Non-traumatic compression fracture of lumbar vertebra with routine healing 08/26/2017  ? Anxiety and depression 08/26/2017  ? Type 2 diabetes mellitus with other specified complication ([place]) 97/35/3299  ? Essential hypertension 04/27/2017  ? Osteoporosis 04/27/2017  ? ? ?Past Surgical History:  ?Procedure Laterality Date  ? ABDOMINAL HYSTERECTOMY  1984  ? ABDOMINAL HYSTERECTOMY    ? APPENDECTOMY  1958  ? CHOLECYSTECTOMY    ? GALLBLADDER SURGERY    ? MANDIBLE FRACTURE SURGERY  07/13/2016  ? TONSILLECTOMY  1958  ? TONSILLECTOMY    ? WRIST FRACTURE SURGERY Right   ? 2000  ? ? ?OB History   ?No obstetric history on file. ?  ? ? ? ?Home Medications   ? ?Prior to Admission medications    ?Medication Sig Start Date End Date Taking? Authorizing Provider  ?amLODipine (NORVASC) 10 MG tablet One tablet by mouth every day for blood pressure. 06/17/21   [name], NP  ?Calcium-Magnesium-Vitamin D [phone] MG-MG-UNIT TABS Take 1 tablet by mouth daily.    [provider]  ?celecoxib (CELEBREX) 100 MG capsule Take 100 mg by mouth daily. ?Patient not taking: Reported on 10/13/2021 09/08/21   [provider]  ?cloNIDine (CATAPRES) 0.1 MG tablet Take 1 tablet by mouth once daily as needed for systolic blood pressure greater than 170. 10/24/20   [name], NP  ?diclofenac Sodium (VOLTAREN) 1 % GEL Apply 1 application topically 4 (four) times daily. 09/23/21   [provider]  ?glimepiride (AMARYL) 2 MG tablet Take 1 tablet (2 mg total) by mouth 2 (two) times daily with a meal. 10/07/20   [name], NP  ?glucose blood (ONE TOUCH ULTRA TEST) test strip USE AS DIRECTED TEST 3 TIMES DAILY E11.9 01/18/19   [name], NP  ?irbesartan (AVAPRO) 300 MG tablet Take 1 tablet (300 mg total) by mouth daily. For blood pressure. 06/18/21   [name], NP  ?metFORMIN (GLUCOPHAGE) 1000 MG tablet TAKE ONE TABLET BY MOUTH TWICE DAILY with A meal FOR diabetes 09/17/21   [name], NP  ?metoprolol succinate (TOPROL-XL) 25 MG 24 hr  tablet TAKE ONE TABLET BY MOUTH ONCE DAILY FOR blood pressure 06/18/21   [name], NP  ?Vitamin D, Ergocalciferol, (DRISDOL) 1.25 MG (50000 UNIT) CAPS capsule TAKE ONE CAPSULE BY MOUTH ONCE A WEEK FOR low vitamin d 09/17/21   [name], NP  ? ? ?Family History ?Family History  ?Problem Relation Age of Onset  ? Hypertension Mother   ? Stroke Mother   ? Stroke Father   ? Hypertension Father   ? Hypertension Sister   ? Kidney disease Sister   ? Breast cancer Neg Hx   ? ? ?Social History ?Social History  ? ?Tobacco Use  ? Smoking status: Never  ? Smokeless tobacco: Never  ?Vaping Use  ? Vaping Use: Never used  ?Substance  Use Topics  ? Alcohol use: No  ? Drug use: No  ? ? ? ?Allergies   ?Sulfamethoxazole-trimethoprim and Penicillins ? ? ?Review of Systems ?Review of Systems ?Pertinent negatives listed in HPI  ? ?Physical Exam ?Triage Vital Signs ?ED Triage Vitals  ?Enc Vitals Group  ?   BP   ?   Pulse   ?   Resp   ?   Temp   ?   Temp src   ?   SpO2   ?   Weight   ?   Height   ?   Head Circumference   ?   Peak Flow   ?   Pain Score   ?   Pain Loc   ?   Pain Edu?   ?   Excl. in [place]?   ? ?No data found. ? ?Updated Vital Signs ?BP (!) 176/74 (BP Location: Left Arm)   Pulse 95   Temp 97.9 ?F (36.6 ?C) (Oral)   Resp 16   SpO2 97%  ? ?Visual Acuity ?Right Eye Distance:   ?Left Eye Distance:   ?Bilateral Distance:   ? ?Right Eye Near:   ?Left Eye Near:    ?Bilateral Near:    ? ?Physical Exam ?Constitutional: Patient appears well-developed and well-nourished. ?HENT: Normocephalic, atraumatic, External right and left ear normal.  ?CVS: RRR, S1/S2 +, no murmurs, no gallops, no carotid bruit.  ?Pulmonary: Effort and breath sounds normal, no stridor, rhonchi, wheezes, rales.  ?Abdominal: Soft.  Negative  CVA tenderness bilaterally. ?Neurological: No obvious focal neurological deficit noted on exam  ?Skin: Skin is warm and dry. No rash noted. Not diaphoretic. No erythema. No pallor. ?Psychiatric: Normal mood and affect. Behavior, judgment, thought content normal.  ?UC Treatments / Results  ?Labs ?(all labs ordered are listed, but only abnormal results are displayed) ?Labs Reviewed - No data to display ? ?EKG ? ? ?Radiology ?No results found. ? ?Procedures ?Procedures (including critical care time) ? ?Medications Ordered in UC ?Medications - No data to display ? ?Initial Impression / Assessment and Plan / UC Course  ?I have reviewed the triage vital signs and the nursing notes. ? ?Pertinent labs & imaging results that were available during my care of the patient were reviewed by me and considered in my medical decision making (see chart for  details). ? ?  ?Acute cystitis with hematuria ?Treatment with Keflex 500 mg twice daily x7 days.  Patient has been treated successfully with Keflex in the past although she does have a documented penicillin allergy she has had no prior reactions to Keflex. ?Urine culture pending. ?Strict return precautions given if any of her symptoms worsen or do not improve. ?Final Clinical Impressions(s) / UC Diagnoses  ? ?  Final diagnoses:  ?Dysuria  ?Acute cystitis with hematuria  ? ? ? ?Discharge Instructions   ? ?  ?Start medication take as directed.  Hydrate well with fluids.  I have sent your urine out to be cultured if any changes in treatment is warranted someone from our office will contact you. ? ? ? ?ED Prescriptions   ? ? Medication Sig Dispense Auth. Provider  ? cephALEXin (KEFLEX) 500 MG capsule Take 1 capsule (500 mg total) by mouth 2 (two) times daily. 7 capsule Scot Jun, FNP  ? ?  ? ?PDMP not reviewed this encounter. ?  ?Scot Jun, FNP ?10/24/21 1058 ? ?

## 2021-10-25 ENCOUNTER — Ambulatory Visit: Payer: Medicare Other

## 2021-10-26 ENCOUNTER — Encounter: Payer: Self-pay | Admitting: Primary Care

## 2021-10-27 LAB — URINE CULTURE
Culture: 70000 — AB
Special Requests: NORMAL

## 2021-11-20 DIAGNOSIS — Z20822 Contact with and (suspected) exposure to covid-19: Secondary | ICD-10-CM | POA: Diagnosis not present

## 2021-12-07 DIAGNOSIS — Z20822 Contact with and (suspected) exposure to covid-19: Secondary | ICD-10-CM | POA: Diagnosis not present

## 2021-12-16 ENCOUNTER — Other Ambulatory Visit: Payer: Self-pay | Admitting: Primary Care

## 2021-12-16 DIAGNOSIS — E119 Type 2 diabetes mellitus without complications: Secondary | ICD-10-CM

## 2021-12-17 ENCOUNTER — Telehealth: Payer: Self-pay

## 2021-12-17 NOTE — Progress Notes (Signed)
? ? ?Chronic Care Management ?Pharmacy Assistant  ? ?Name: Morgan Bowen  MRN: [id number] DOB: 1935-08-26 ? ?Reason for Encounter: CCM (Medication Adherence and Delivery Coordination) ?  ?Recent office visits:  ?10/13/21 [name], NP Diabetes A1c 7.4 Stop (patient) Tizanidine 2 mg. F/U in 6 months.  ? ?Recent consult visits:  ?10/24/21 [facility]: Dysuria Start: Cephalexin 500 mg.  ?10/01/21 Gardiner Barefoot, DPM (Podiatry): Painful toenails Procedure:  Debridement of mycotic and hypertrophic toenails. Diabetic foot exam.  ? ?Hospital visits:  ?None in previous 6 months ? ?Medications: ?Outpatient Encounter Medications as of 12/17/2021  ?Medication Sig  ? amLODipine (NORVASC) 10 MG tablet One tablet by mouth every day for blood pressure.  ? Calcium-Magnesium-Vitamin D [phone] MG-MG-UNIT TABS Take 1 tablet by mouth daily.  ? celecoxib (CELEBREX) 100 MG capsule Take 100 mg by mouth daily. (Patient not taking: Reported on 10/13/2021)  ? cephALEXin (KEFLEX) 500 MG capsule Take 1 capsule (500 mg total) by mouth 2 (two) times daily.  ? cloNIDine (CATAPRES) 0.1 MG tablet Take 1 tablet by mouth once daily as needed for systolic blood pressure greater than 170.  ? diclofenac Sodium (VOLTAREN) 1 % GEL Apply 1 application topically 4 (four) times daily.  ? glimepiride (AMARYL) 2 MG tablet Take 1 tablet (2 mg total) by mouth 2 (two) times daily with a meal. for diabetes.  ? glucose blood (ONE TOUCH ULTRA TEST) test strip USE AS DIRECTED TEST 3 TIMES DAILY E11.9  ? irbesartan (AVAPRO) 300 MG tablet Take 1 tablet (300 mg total) by mouth daily. For blood pressure.  ? metFORMIN (GLUCOPHAGE) 1000 MG tablet TAKE ONE TABLET BY MOUTH TWICE DAILY WITH A MEAL FOR DIABETES  ? metoprolol succinate (TOPROL-XL) 25 MG 24 hr tablet TAKE ONE TABLET BY MOUTH ONCE DAILY FOR blood pressure  ? Vitamin D, Ergocalciferol, (DRISDOL) 1.25 MG (50000 UNIT) CAPS capsule TAKE ONE CAPSULE BY MOUTH ONCE A WEEK FOR low vitamin d  ? ?No  facility-administered encounter medications on file as of 12/17/2021.  ? ?BP Readings from Last 3 Encounters:  ?10/24/21 (!) 176/74  ?10/13/21 130/60  ?06/16/21 (!) 160/78  ?  ?Lab Results  ?Component Value Date  ? HGBA1C 7.4 (A) 10/13/2021  ?  ?Recent OV, Consult or Hospital visit:  ?No medication changes indicated ? ?Last adherence delivery date: 09/30/2021    ? ?Patient is due for next adherence delivery on:  12/29/2021 ? ?Spoke with patient on 12/17/2021 reviewed medications and coordinated delivery. ? ?This delivery to include: Vials  90 Days   No safety caps  ?VIAL medications: ?Amlodipine '10mg'$ -  1 tablet daily (breakfast) ?Metoprolol '25mg'$ - 1 tablet daily (bedtime) ?Irbesartan '300mg'$ - 1 tablet daily (bedtime) ?Glimepiride '2mg'$  - 2 tablets daily (2- lunch) ?Metformin '1000mg'$  1 tablet twice daily (1 breakfast, 1 evening meal) ?Vitamin D 12.50 mcg (50,000 units) - 1 tablet every Friday (breakfast) ?  ?Patient declined the following medications this month: ?none ?  ?Any concerns about your medications? No ?  ?How often do you forget or accidentally miss a dose? Rarely ?  ?Do you use a pillbox? Yes ?  ?Is patient in packaging No ?  ?Refills requested from providers include: ?Glimepiride '2mg'$  - 2 tablets daily (2- lunch) ?Metformin '1000mg'$  1 tablet twice daily (1 breakfast, 1 evening meal) ? ?Confirmed delivery date of 12/29/2021, advised patient that pharmacy will contact them the morning of delivery. ? ?Recent blood pressure readings are as follows: Patient did not have access to her readings.  ? ?  Recent blood glucose readings are as follows: Patient did not have access to her readings.  ? ?Annual wellness visit in last year? No 04/01/2020 ?Most Recent BP reading: 176/74 on 10/24/2021 ? ?If Diabetic: ?Most recent A1C reading: 7.4 on 10/13/2021 ?Last eye exam / retinopathy screening: 11/18/2020 ?Last diabetic foot exam: Up to date ? ?[name], CPP notified ? ?[name], RMA ?Clinical Pharmacy  Assistant ?[phone] ? ? ? ?

## 2021-12-31 ENCOUNTER — Ambulatory Visit (INDEPENDENT_AMBULATORY_CARE_PROVIDER_SITE_OTHER): Payer: Medicare Other | Admitting: Podiatry

## 2021-12-31 ENCOUNTER — Encounter: Payer: Self-pay | Admitting: Podiatry

## 2021-12-31 DIAGNOSIS — E119 Type 2 diabetes mellitus without complications: Secondary | ICD-10-CM | POA: Diagnosis not present

## 2021-12-31 DIAGNOSIS — M79674 Pain in right toe(s): Secondary | ICD-10-CM

## 2021-12-31 DIAGNOSIS — M79675 Pain in left toe(s): Secondary | ICD-10-CM | POA: Diagnosis not present

## 2021-12-31 DIAGNOSIS — M13861 Other specified arthritis, right knee: Secondary | ICD-10-CM | POA: Diagnosis not present

## 2021-12-31 DIAGNOSIS — M25561 Pain in right knee: Secondary | ICD-10-CM | POA: Diagnosis not present

## 2021-12-31 DIAGNOSIS — B351 Tinea unguium: Secondary | ICD-10-CM

## 2021-12-31 DIAGNOSIS — D689 Coagulation defect, unspecified: Secondary | ICD-10-CM | POA: Diagnosis not present

## 2021-12-31 NOTE — Progress Notes (Signed)
Complaint:  Visit Type: This patient presents to the office for at risk foot care patient requires this care by professional since  this patient will be at risk  due to having diabetes.  Patient is unable to cut her own toenails since she cannot reach down.  She presents for at risk foot care today.    Podiatric Exam: Vascular: dorsalis pedis and posterior tibial pulses are palpable bilateral. Capillary return is immediate. Temperature gradient is WNL. Skin turgor WNL  Sensorium: Normal Semmes Weinstein monofilament test. Normal tactile sensation bilaterally. Nail Exam: Pt has thick disfigured discolored nails with subungual debris noted bilateral entire nail hallux.  Nails are thick disfigured and discolored hallux toenails. Ulcer Exam: There is no evidence of ulcer or pre-ulcerative changes or infection. Orthopedic Exam: Muscle tone and strength are WNL. No limitations in general ROM. No crepitus or effusions noted. Foot type and digits show no abnormalities. Bony prominences are unremarkable. Skin: No Porokeratosis. No infection or ulcers  Diagnosis:  Onychomycosis, , Pain in right toe, pain in left toes  Midfoot arthritis.  Treatment & Plan Procedures and Treatment: Consent by patient was obtained for treatment procedures.   Debridement of mycotic and hypertrophic toenails, 1 through 5 bilateral and clearing of subungual debris. No ulceration, no infection noted.  Told the patient to return for periodic foot evaluation to reduce potential  at risk complications   Return Visit-Office Procedure: Patient instructed to return to the office for a follow up visit 3 months for continued evaluation and treatment.    Aerin Delany DPM 

## 2022-01-11 ENCOUNTER — Other Ambulatory Visit: Payer: Self-pay

## 2022-01-11 ENCOUNTER — Emergency Department
Admission: EM | Admit: 2022-01-11 | Discharge: 2022-01-12 | Disposition: A | Discharge status: Home / Self Care | Payer: Medicare Other | Attending: Emergency Medicine | Admitting: Emergency Medicine

## 2022-01-11 DIAGNOSIS — E860 Dehydration: Secondary | ICD-10-CM | POA: Insufficient documentation

## 2022-01-11 DIAGNOSIS — R42 Dizziness and giddiness: Secondary | ICD-10-CM | POA: Insufficient documentation

## 2022-01-11 DIAGNOSIS — R197 Diarrhea, unspecified: Secondary | ICD-10-CM | POA: Diagnosis not present

## 2022-01-11 DIAGNOSIS — I10 Essential (primary) hypertension: Secondary | ICD-10-CM | POA: Insufficient documentation

## 2022-01-11 DIAGNOSIS — R61 Generalized hyperhidrosis: Secondary | ICD-10-CM | POA: Diagnosis not present

## 2022-01-11 DIAGNOSIS — S86911A Strain of unspecified muscle(s) and tendon(s) at lower leg level, right leg, initial encounter: Secondary | ICD-10-CM | POA: Diagnosis not present

## 2022-01-11 DIAGNOSIS — E119 Type 2 diabetes mellitus without complications: Secondary | ICD-10-CM | POA: Insufficient documentation

## 2022-01-11 DIAGNOSIS — R300 Dysuria: Secondary | ICD-10-CM | POA: Diagnosis not present

## 2022-01-11 LAB — BASIC METABOLIC PANEL
Anion gap: 10 (ref 5–15)
BUN: 19 mg/dL (ref 8–23)
CO2: 23 mmol/L (ref 22–32)
Calcium: 9.6 mg/dL (ref 8.9–10.3)
Chloride: 104 mmol/L (ref 98–111)
Creatinine, Ser: 1.34 mg/dL — ABNORMAL HIGH (ref 0.44–1.00)
GFR, Estimated: 39 mL/min — ABNORMAL LOW (ref >60)
Glucose, Bld: 296 mg/dL — ABNORMAL HIGH (ref 70–99)
Potassium: 5.2 mmol/L — ABNORMAL HIGH (ref 3.5–5.1)
Sodium: 137 mmol/L (ref 135–145)

## 2022-01-11 LAB — CBC
HCT: 38.3 % (ref 36.0–46.0)
Hemoglobin: 12.6 g/dL (ref 12.0–15.0)
MCH: 30.7 pg (ref 26.0–34.0)
MCHC: 32.9 g/dL (ref 30.0–36.0)
MCV: 93.4 fL (ref 80.0–100.0)
Platelets: 295 10*3/uL (ref 150–400)
RBC: 4.1 MIL/uL (ref 3.87–5.11)
RDW: 13.2 % (ref 11.5–15.5)
WBC: 10.9 10*3/uL — ABNORMAL HIGH (ref 4.0–10.5)
nRBC: 0 % (ref 0.0–0.2)

## 2022-01-11 LAB — CBG MONITORING, ED: Glucose-Capillary: 309 mg/dL — ABNORMAL HIGH (ref 70–99)

## 2022-01-11 MED ORDER — LACTATED RINGERS IV BOLUS
1000.0000 mL | INTRAVENOUS | Status: AC
  Administered 2022-01-11: 1000 mL via INTRAVENOUS

## 2022-01-11 NOTE — ED Provider Notes (Signed)
[facility] Provider Note    Event Date/Time   First MD Initiated Contact with Patient 01/11/22 2322     (approximate)   History   Dizziness   HPI  Morgan Bowen is a 86 y.o. female with a history of diabetes, hypertension, GERD, anxiety who presents for evaluation of dizziness.  Patient reports having several episodes of watery diarrhea last night and 2 episodes earlier in the day.  Around 5 PM she was sitting at her home when she developed sudden onset of lightheadedness and was clammy and diaphoretic.  She reports that she felt like she was going to pass out right after she broke out in a sweat.  She checked her vitals and her blood glucose.  Her CBG was 300 and she also reports that her temperature was 75F.  The episode lasted 15 minutes and resolved without intervention.  At that point her body temperature returned to normal.  She reports that a CBG of 300 is really high for her.  She is on glimepiride and metformin and denies any changes on her diabetic regimen.  She denies any food indiscretions.  She denies any headache, chest pain, shortness of breath, fever chills, back pain, abdominal pain preceding, associated with, or after this episode.  She had no slurred speech, no unilateral weakness or numbness, no facial droop.  This episode happened 6 hours ago and patient has been back to baseline ever since.     Past Medical History:  Diagnosis Date   Acute cystitis 05/23/2018   Anxiety    Arthritis    Cellulitis 12/13/2018   Chickenpox    Diabetes mellitus without complication (HCC)    GERD (gastroesophageal reflux disease)    Hypertension    Osteoporosis    Type 2 diabetes mellitus ([place])    Urinary tract infection     Past Surgical History:  Procedure Laterality Date   ABDOMINAL HYSTERECTOMY  1984   ABDOMINAL HYSTERECTOMY     APPENDECTOMY  1958   CHOLECYSTECTOMY     GALLBLADDER SURGERY     MANDIBLE FRACTURE SURGERY  07/13/2016   TONSILLECTOMY   1958   TONSILLECTOMY     WRIST FRACTURE SURGERY Right    2000     Physical Exam   Triage Vital Signs: ED Triage Vitals  Enc Vitals Group     BP 01/11/22 1833 (!) 165/70     Pulse Rate 01/11/22 1833 (!) 103     Resp 01/11/22 1833 18     Temp 01/11/22 1833 97.8 F (36.6 C)     Temp src --      SpO2 01/11/22 1833 100 %     Weight --      Height --      Head Circumference --      Peak Flow --      Pain Score 01/11/22 1830 0     Pain Loc --      Pain Edu? --      Excl. in [place]? --     Most recent vital signs: Vitals:   01/11/22 1833  BP: (!) 165/70  Pulse: (!) 103  Resp: 18  Temp: 97.8 F (36.6 C)  SpO2: 100%     Constitutional: Alert and oriented. Well appearing and in no apparent distress. HEENT:      Head: Normocephalic and atraumatic.         Eyes: Conjunctivae are normal. Sclera is non-icteric.       Mouth/Throat:  Mucous membranes are moist.       Neck: Supple with no signs of meningismus. Cardiovascular: Regular rate and rhythm. No murmurs, gallops, or rubs. 2+ symmetrical distal pulses are present in all extremities.  Respiratory: Normal respiratory effort. Lungs are clear to auscultation bilaterally.  Gastrointestinal: Soft, non tender, and non distended with positive bowel sounds. No rebound or guarding. Genitourinary: No CVA tenderness. Musculoskeletal:  No edema, cyanosis, or erythema of extremities. Neurologic: Normal speech and language. Face is symmetric.  Intact strength and sensation x4, no pronator drift, no dysmetria, normal gait  skin: Skin is warm, dry and intact. No rash noted. Psychiatric: Mood and affect are normal. Speech and behavior are normal.  ED Results / Procedures / Treatments   Labs (all labs ordered are listed, but only abnormal results are displayed) Labs Reviewed  BASIC METABOLIC PANEL - Abnormal; Notable for the following components:      Result Value   Potassium 5.2 (*)    Glucose, Bld 296 (*)    Creatinine, Ser 1.34 (*)     GFR, Estimated 39 (*)    All other components within normal limits  CBC - Abnormal; Notable for the following components:   WBC 10.9 (*)    All other components within normal limits  CBG MONITORING, ED - Abnormal; Notable for the following components:   Glucose-Capillary 309 (*)    All other components within normal limits  URINALYSIS, ROUTINE W REFLEX MICROSCOPIC  TROPONIN I (HIGH SENSITIVITY)  TROPONIN I (HIGH SENSITIVITY)     EKG  ED ECG REPORT I, Rudene Re, the attending physician, personally viewed and interpreted this ECG.  Sinus tachycardia with a rate of 106, low voltage QRS, no ST elevations or depressions.  Unchanged when compared to prior from 2022  RADIOLOGY  none   PROCEDURES:  Critical Care performed: No  Procedures    IMPRESSION / MDM / [place] / ED COURSE  I reviewed the triage vital signs and the nursing notes.  86 y.o. female with a history of diabetes, hypertension, GERD, anxiety who presents for evaluation of dizziness.  Patient presents for evaluation after a brief self-limited episode of feeling lightheaded like she was going to pass out associated with diaphoresis lasting about 15 minutes.  Patient has been back to baseline for 6 hours with no further episodes.  She is completely neurologically intact.  Her vitals are within normal limits.  Her exam is nonfocal otherwise.  Ddx: Dysrhythmia versus vertigo versus dehydration versus DKA versus vasovagal episode versus ACS.  There is no signs of stroke.  Patient denies any signs of infectious etiology.   Plan: EKG and monitor on telemetry for any signs of dysrhythmias.  We will get basic blood work to rule out DKA, dehydration, AKI, or electrolyte derangements.   MEDICATIONS GIVEN IN ED: Medications  lactated ringers bolus 1,000 mL (1,000 mLs Intravenous New Bag/Given 01/11/22 2356)     ED COURSE: EKG telemetry with no signs of dysrhythmias or ischemia.  Mild hyperglycemia  with no evidence of DKA, mild bump in her creatinine possibly consistent with mild dehydration in the setting of diarrhea.  Patient received a liter of IV fluids.  She was monitored on telemetry for an hour with no signs of dysrhythmias.  Continues with no further episodes of dizziness.  At this time with no further episodes and a otherwise reassuring work-up I feel the patient is safe for discharge home with close follow-up with primary care doctor.  Discussed my  standard return precautions.  Patient be discharged to the care of her son who is at bedside.   Consults: None   EMR reviewed including last visit with her primary care doctor for diabetes and hypertension from March 2020    FINAL CLINICAL IMPRESSION(S) / ED DIAGNOSES   Final diagnoses:  Dizziness  Diarrhea of presumed infectious origin  Dehydration     Rx / DC Orders   ED Discharge Orders     None        Note:  This document was prepared using Dragon voice recognition software and may include unintentional dictation errors.   Please note:  Patient was evaluated in Emergency Department today for the symptoms described in the history of present illness. Patient was evaluated in the context of the global COVID-19 pandemic, which necessitated consideration that the patient might be at risk for infection with the SARS-CoV-2 virus that causes COVID-19. Institutional protocols and algorithms that pertain to the evaluation of patients at risk for COVID-19 are in a state of rapid change based on information released by regulatory bodies including the CDC and federal and state organizations. These policies and algorithms were followed during the patient's care in the ED.  Some ED evaluations and interventions may be delayed as a result of limited staffing during the pandemic.       Alfred Levins, [place], MD 01/12/22 [phone]

## 2022-01-11 NOTE — ED Triage Notes (Addendum)
Pt comes with c/o dizziness and sweats that all started about an hour ago. Pt states drop in temp as well. Pt denies any pain.  Pt states she is diabetic and checked her sugar earlier and it was in 300s. Pt states this is when all the symptoms started. Pt denies any blurry vision.

## 2022-01-12 ENCOUNTER — Telehealth: Payer: Self-pay

## 2022-01-12 DIAGNOSIS — R42 Dizziness and giddiness: Secondary | ICD-10-CM | POA: Diagnosis not present

## 2022-01-12 LAB — URINALYSIS, ROUTINE W REFLEX MICROSCOPIC
Bilirubin Urine: NEGATIVE
Glucose, UA: 50 mg/dL — AB
Hgb urine dipstick: NEGATIVE
Ketones, ur: NEGATIVE mg/dL
Nitrite: NEGATIVE
Protein, ur: NEGATIVE mg/dL
Specific Gravity, Urine: 1.01 (ref 1.005–1.030)
WBC, UA: >50 WBC/hpf — ABNORMAL HIGH (ref 0–5)
pH: 5 (ref 5.0–8.0)

## 2022-01-12 LAB — TROPONIN I (HIGH SENSITIVITY)
Troponin I (High Sensitivity): 4 ng/L (ref <18)
Troponin I (High Sensitivity): 5 ng/L (ref <18)

## 2022-01-12 NOTE — Telephone Encounter (Signed)
Noted, ED notes and labs reviewed.

## 2022-01-12 NOTE — Telephone Encounter (Signed)
Per chart review tab pt seen at [facility] ED on 01/11/22. Sending note to Gentry Fitz NP and [facility] CMA.

## 2022-01-12 NOTE — Telephone Encounter (Signed)
[place] Night - Client TELEPHONE ADVICE RECORD [facility] Patient Name: Morgan Bowen Gender: Female DOB: [birth year] Age: 86 Y 33 M Return Phone Number: [phone] (Primary) Address: [address] City/ State/ Zip: [place]  [address] Client [name] - Client Client [facility] [place] Provider [name] - NP Contact Type Call Who Is Calling Patient / Member / Family / Caregiver Call Type Triage / Clinical Caller Name Telena Peyser Relationship To Patient Son Return Phone Number [phone] (Primary) Chief Complaint LOW BODY TEMP less than 97 Reason for Call Symptomatic / Request for [place] says that his Morgan Bowen's temp is 95, she is sweating and she is cold. Translation No Nurse Assessment Nurse: Frederic Jericho, RN, Clarise Cruz Date/Time (Eastern Time): 01/11/2022 5:10:26 PM Confirm and document reason for call. If symptomatic, describe symptoms. ---Caller states Morgan Bowen reports a low body temperature and felt like passing out. Diaphoretic at this time. Temperature 96 Does the patient have any new or worsening symptoms? ---Yes Will a triage be completed? ---Yes Related visit to physician within the last 2 weeks? ---N/A Does the PT have any chronic conditions? (i.e. diabetes, asthma, this includes High risk factors for pregnancy, etc.) ---Yes List chronic conditions. ---diabetes, hypertension Is this a behavioral health or substance abuse call? ---No Guidelines Guideline Title Affirmed Question Affirmed Notes Nurse Date/Time (Eastern Time) Dizziness - Lightheadedness Extra heartbeats, irregular heart beating, or heart is beating very fast (i.e., "palpitations") Frederic Jericho, RN, Clarise Cruz 01/11/2022 5:13:19 PM Disp. Time Eilene Ghazi Time) Disposition Final User 01/11/2022 5:07:54 PM Send to Urgent Deatra Ina, Rosie PLEASE NOTE: All timestamps  contained within this report are represented as [place] Standard Time. CONFIDENTIALTY NOTICE: This fax transmission is intended only for the addressee. It contains information that is legally privileged, confidential or otherwise protected from use or disclosure. If you are not the intended recipient, you are strictly prohibited from reviewing, disclosing, copying using or disseminating any of this information or taking any action in reliance on or regarding this information. If you have received this fax in error, please notify us immediately by telephone so that we can arrange for its return to [place]. Phone: [phone], Toll-Free: [phone], Fax: [phone] Page: 2 of 2 Call Id: [id number] 01/11/2022 5:24:51 PM Go to ED Now (or PCP triage) Yes Frederic Jericho, RN, Margurite Auerbach Disagree/Comply Comply Caller Understands Yes PreDisposition InappropriateToAsk Care Advice Given Per Guideline GO TO ED NOW (OR PCP TRIAGE): * Bring a list of your current medicines when you go to the Emergency Department (ER). * It is better and safer if another adult drives instead of you. CARE ADVICE given per Dizziness (Adult) guideline. Comments User: Virgil Benedict, RN Date/Time Eilene Ghazi Time): 01/11/2022 5:22:57 PM Caller now reports Morgan Bowen looks pale but does not look clammy. Caller reported "when she puts her fingers to her neck her pulse feels fast" Blood pressure is currently 184/76, heart rate 114 User: Virgil Benedict, RN Date/Time Eilene Ghazi Time): 01/11/2022 5:24:29 PM Blood sugar 280 Referrals [place]

## 2022-01-13 LAB — URINE CULTURE

## 2022-01-15 ENCOUNTER — Ambulatory Visit (INDEPENDENT_AMBULATORY_CARE_PROVIDER_SITE_OTHER): Payer: Medicare Other | Admitting: Primary Care

## 2022-01-15 ENCOUNTER — Encounter: Payer: Self-pay | Admitting: Primary Care

## 2022-01-15 VITALS — BP 154/88 | HR 89 | Temp 98.6°F | Ht 65.0 in | Wt 123.0 lb

## 2022-01-15 DIAGNOSIS — N289 Disorder of kidney and ureter, unspecified: Secondary | ICD-10-CM | POA: Diagnosis not present

## 2022-01-15 DIAGNOSIS — T148XXA Other injury of unspecified body region, initial encounter: Secondary | ICD-10-CM | POA: Diagnosis not present

## 2022-01-15 DIAGNOSIS — R42 Dizziness and giddiness: Secondary | ICD-10-CM

## 2022-01-15 HISTORY — DX: Dizziness and giddiness: R42

## 2022-01-15 LAB — BASIC METABOLIC PANEL
BUN: 21 mg/dL (ref 6–23)
CO2: 25 mEq/L (ref 19–32)
Calcium: 9.9 mg/dL (ref 8.4–10.5)
Chloride: 100 mEq/L (ref 96–112)
Creatinine, Ser: 1.13 mg/dL (ref 0.40–1.20)
GFR: 44.86 mL/min — ABNORMAL LOW (ref >60.00)
Glucose, Bld: 183 mg/dL — ABNORMAL HIGH (ref 70–99)
Potassium: 4.8 mEq/L (ref 3.5–5.1)
Sodium: 136 mEq/L (ref 135–145)

## 2022-01-15 MED ORDER — CEPHALEXIN 500 MG PO CAPS: 500.0000 mg | ORAL_CAPSULE | Freq: Two times a day (BID) | ORAL | 0 refills | Status: DC

## 2022-01-15 NOTE — Progress Notes (Signed)
Subjective:    Patient ID: Morgan Bowen, female    DOB: 1936/04/25, 86 y.o.   MRN: [id number]  HPI  Morgan Bowen is a very pleasant 86 y.o. female with a history of hypertension, type 2 diabetes, osteoporosis, compression fracture of lumbar spine, palpitations, acute DVT who presents today for ED follow-up and a skin wound.   1) Dizziness/Diarrhea: She presented to [facility] ED on 01/11/2022 for symptoms of multiple episodes of watery diarrhea the night prior and that day, sudden onset of lightheadedness with diaphoresis and clammy skin.  By the time she was evaluated by the ED physician her symptoms had resolved for 6 hours.    During her stay in the ED her exam and vitals were normal.  Her EKG was without acute findings.  There were no arrhythmias noted on telemetry.  Her labs showed mild hyperkalemia, slight increased creatinine, and mild leukocytosis, otherwise negative.  Urine culture was negative for cystitis.  She was treated with IV fluids. She was discharged home later that evening with recommendations for PCP follow-up.  Since her ED visit she's feeling much better. She denies dizziness, diarrhea, abdominal pain, near syncope. She is eating and drinking well. She is concerned about her kidney function.   2) Skin Wound: She was scratched by her neighbors dog three weeks ago to the right posterior forearm (anatomical position). Since then she's noticed a continued open laceration without drainage and without healing. She's been cleaning with peroxide and applying neosporin without resolve or improvement.   Her neighbors dog did scratch her other arm a few weeks prior but that wound resolved. Her last tetanus vaccine was in 2016.   Review of Systems  Constitutional:  Negative for fever.  Gastrointestinal:  Negative for abdominal pain, blood in stool, diarrhea, nausea and vomiting.  Skin:  Positive for color change and wound.  Neurological:  Negative for dizziness and light-headedness.          Past Medical History:  Diagnosis Date   Acute cystitis 05/23/2018   Anxiety    Arthritis    Cellulitis 12/13/2018   Chickenpox    Diabetes mellitus without complication (HCC)    GERD (gastroesophageal reflux disease)    Hypertension    Osteoporosis    Type 2 diabetes mellitus ([place])    Urinary tract infection     Social History   Socioeconomic History   Marital status: Widowed    Spouse name: Not on file   Number of children: Not on file   Years of education: Not on file   Highest education level: Not on file  Occupational History   Not on file  Tobacco Use   Smoking status: Never   Smokeless tobacco: Never  Vaping Use   Vaping Use: Never used  Substance and Sexual Activity   Alcohol use: No   Drug use: No   Sexual activity: Not Currently  Other Topics Concern   Not on file  Social History Narrative   ** Merged History Encounter **       Widowed. Moved from [place]. 1 son, 3 grandchildren. Retired. Once worked as a Agricultural engineer.    Social Determinants of Health   Financial Resource Strain: Low Risk  (10/24/2020)   Overall Financial Resource Strain (CARDIA)    Difficulty of Paying Living Expenses: Not very hard  Food Insecurity: No Food Insecurity (04/01/2020)   Hunger Vital Sign    Worried About Running Out of Food in the Last Year: Never true  Ran Out of Food in the Last Year: Never true  Transportation Needs: No Transportation Needs (04/01/2020)   PRAPARE - Hydrologist (Medical): No    Lack of Transportation (Non-Medical): No  Physical Activity: Inactive (04/01/2020)   Exercise Vital Sign    Days of Exercise per Week: 0 days    Minutes of Exercise per Session: 0 min  Stress: No Stress Concern Present (04/01/2020)   [place]    Feeling of Stress : Not at all  Social Connections: Not on file  Intimate Partner Violence: Not At Risk (04/01/2020)    Humiliation, Afraid, Rape, and Kick questionnaire    Fear of Current or Ex-Partner: No    Emotionally Abused: No    Physically Abused: No    Sexually Abused: No    Past Surgical History:  Procedure Laterality Date   ABDOMINAL HYSTERECTOMY  [place]  07/13/2016   TONSILLECTOMY  1958   TONSILLECTOMY     WRIST FRACTURE SURGERY Right    2000    Family History  Problem Relation Age of Onset   Hypertension Mother    Stroke Mother    Stroke Father    Hypertension Father    Hypertension Sister    Kidney disease Sister    Breast cancer Neg Hx     Allergies  Allergen Reactions   Sulfamethoxazole-Trimethoprim    Penicillins Rash    Current Outpatient Medications on File Prior to Visit  Medication Sig Dispense Refill   amLODipine (NORVASC) 10 MG tablet One tablet by mouth every day for blood pressure. 90 tablet 2   Calcium-Magnesium-Vitamin D [id number] MG-MG-UNIT TABS Take 1 tablet by mouth daily.     cloNIDine (CATAPRES) 0.1 MG tablet Take 1 tablet by mouth once daily as needed for systolic blood pressure greater than 170. 30 tablet 0   glimepiride (AMARYL) 2 MG tablet Take 1 tablet (2 mg total) by mouth 2 (two) times daily with a meal. for diabetes. 180 tablet 1   glucose blood (ONE TOUCH ULTRA TEST) test strip USE AS DIRECTED TEST 3 TIMES DAILY E11.9 100 each 2   irbesartan (AVAPRO) 300 MG tablet Take 1 tablet (300 mg total) by mouth daily. For blood pressure. 90 tablet 2   metFORMIN (GLUCOPHAGE) 1000 MG tablet TAKE ONE TABLET BY MOUTH TWICE DAILY WITH A MEAL FOR DIABETES 180 tablet 1   metoprolol succinate (TOPROL-XL) 25 MG 24 hr tablet TAKE ONE TABLET BY MOUTH ONCE DAILY FOR blood pressure 90 tablet 2   Vitamin D, Ergocalciferol, (DRISDOL) 1.25 MG (50000 UNIT) CAPS capsule TAKE ONE CAPSULE BY MOUTH ONCE A WEEK FOR low vitamin d 12 capsule 1   No current  facility-administered medications on file prior to visit.    BP (!) 154/88   Pulse 89   Temp 98.6 F (37 C) (Oral)   Ht '5\' 5"'$  (1.651 m)   Wt 123 lb (55.8 kg)   SpO2 98%   BMI 20.47 kg/m  Objective:   Physical Exam Eyes:     Extraocular Movements: Extraocular movements intact.  Cardiovascular:     Rate and Rhythm: Normal rate and regular rhythm.  Pulmonary:     Effort: Pulmonary effort is normal.  Musculoskeletal:     Cervical back: Neck supple.  Skin:  General: Skin is warm and dry.     Comments: 1 cm semi-open laceration to right posterior forearm (anatomical position), mild surrounding erythema. Tender. No drainage.   Neurological:     Mental Status: She is oriented to person, place, and time.     Cranial Nerves: No cranial nerve deficit.           Assessment & Plan:   Problem List Items Addressed This Visit       Musculoskeletal and Integument   Open wound of skin    Minimal healing after three weeks. Exam suspicious for mild infection.  She will update her tetanus vaccine through the pharmacy.  Start cephalexin 500 mg capsules, 1 twice daily x7 days.  She has been treated with cephalexin previously and has had no reaction.        Relevant Medications   cephALEXin (KEFLEX) 500 MG capsule     Other   Decreased renal function   Relevant Orders   Basic metabolic panel   Dizziness - Primary    Evaluated in ED this week. Determined to be secondary to dehydration from diarrhea.   Hospital notes, labs reviewed.   Repeat renal function pending today.   Continue to work on hydration.  Return precautions provided.           [name], NP

## 2022-01-15 NOTE — Patient Instructions (Signed)
Start cephalexin antibiotic for the skin wound.  Take 1 capsule by mouth twice daily for 7 days.  Stop by the lab prior to leaving today. I will notify you of your results once received.   It was a pleasure to see you today!

## 2022-01-15 NOTE — Assessment & Plan Note (Signed)
Minimal healing after three weeks. Exam suspicious for mild infection.  She will update her tetanus vaccine through the pharmacy.  Start cephalexin 500 mg capsules, 1 twice daily x7 days.  She has been treated with cephalexin previously and has had no reaction.

## 2022-01-15 NOTE — Assessment & Plan Note (Signed)
Evaluated in ED this week. Determined to be secondary to dehydration from diarrhea.   Hospital notes, labs reviewed.   Repeat renal function pending today.   Continue to work on hydration.  Return precautions provided.

## 2022-03-16 ENCOUNTER — Other Ambulatory Visit: Payer: Self-pay | Admitting: Primary Care

## 2022-03-16 DIAGNOSIS — M810 Age-related osteoporosis without current pathological fracture: Secondary | ICD-10-CM

## 2022-03-16 DIAGNOSIS — I10 Essential (primary) hypertension: Secondary | ICD-10-CM

## 2022-03-17 ENCOUNTER — Telehealth: Payer: Self-pay

## 2022-03-17 NOTE — Progress Notes (Signed)
Chronic Care Management Pharmacy Assistant   Name: Morgan Bowen  MRN: [id number] DOB: 08-Dec-1935  Reason for Encounter: CCM (Medication Adherence and Delivery Coordination)  Recent office visits:  01/15/22 [name], NP Dizziness Lab results "Kidney function and potassium have improved!" Start: Cephalexin 500 mg Stop (patient): Celecoxib 100 mg Stop (completed): Voltaren Gel  Recent consult visits:  01/11/22 Jenetta Downer, PA (Ortho) Strain of knee No other information 12/31/21 Gardiner Barefoot, DPM (Podiatry) Pain due to onychomycosis of toenails Procedure: Debridement of mycotic and hypertrophic toenails 12/31/21 Gerrit Halls, PA (Ortho) Pain of right knee joint No other information  Hospital visits:  Reviewed hospital notes for details of recent visit. Patient has been contacted by Transitions of Care team: No  Admitted to the ED on 01/11/22. Discharge date was 01/12/22.  Discharged from [facility].   Discharge diagnosis (Principal Problem):  Dizziness Diarrhea of presumed infectious origin  Dehydration Patient was discharged to Home  Brief summary of hospital course: telemetry with no signs of dysrhythmias or ischemia.  Mild hyperglycemia with no evidence of DKA, mild bump in her creatinine possibly consistent with mild dehydration in the setting of diarrhea.  Patient received a liter of IV fluids.  She was monitored on telemetry for an hour with no signs of dysrhythmias.  Continues with no further episodes of dizziness.  At this time with no further episodes and a otherwise reassuring work-up I feel the patient is safe for discharge home with close follow-up with primary care doctor.  Discussed my standard return precautions.  Patient be discharged to the care of her son who is at bedside.  Medications that remain the same after Hospital Discharge:??  -All other medications will remain the same.    Next CCM appt: 06/02/22  Other upcoming appts: CCM  appointment on 04/13/22 foir AWV and PCP  [name], PharmD notified and will determine if action is needed.  Medications: Outpatient Encounter Medications as of 03/17/2022  Medication Sig   amLODipine (NORVASC) 10 MG tablet TAKE ONE TABLET BY MOUTH ONCE DAILY FOR BLOOD PRESSURE. Office visit required for further refills.   Calcium-Magnesium-Vitamin D [id number] MG-MG-UNIT TABS Take 1 tablet by mouth daily.   cephALEXin (KEFLEX) 500 MG capsule Take 1 capsule (500 mg total) by mouth 2 (two) times daily.   cloNIDine (CATAPRES) 0.1 MG tablet Take 1 tablet by mouth once daily as needed for systolic blood pressure greater than 170.   glimepiride (AMARYL) 2 MG tablet Take 1 tablet (2 mg total) by mouth 2 (two) times daily with a meal. for diabetes.   glucose blood (ONE TOUCH ULTRA TEST) test strip USE AS DIRECTED TEST 3 TIMES DAILY E11.9   irbesartan (AVAPRO) 300 MG tablet Take 1 tablet (300 mg total) by mouth daily. for blood pressure. Office visit required for further refills.   metFORMIN (GLUCOPHAGE) 1000 MG tablet TAKE ONE TABLET BY MOUTH TWICE DAILY WITH A MEAL FOR DIABETES   metoprolol succinate (TOPROL-XL) 25 MG 24 hr tablet TAKE ONE TABLET BY MOUTH ONCE DAILY FOR blood pressure. Office visit required for further refills.   Vitamin D, Ergocalciferol, (DRISDOL) 1.25 MG (50000 UNIT) CAPS capsule TAKE ONE CAPSULE BY MOUTH ONCE A WEEK. Office visit required for further refills.   No facility-administered encounter medications on file as of 03/17/2022.   BP Readings from Last 3 Encounters:  01/15/22 (!) 154/88  01/12/22 (!) 161/69  10/24/21 (!) 176/74    Lab Results  Component Value Date  HGBA1C 7.4 (A) 10/13/2021    Recent OV, Consult or Hospital visit:  Recent medication changes indicated:   Start: Cephalexin 500 mg   Stop (patient): Celecoxib 100 mg   Stop (completed): Voltaren Gel  Last adherence delivery date:  12/29/21      Patient is due for next adherence delivery on:  03/29/22  Spoke with patient on 03/17/2022 reviewed medications and coordinated delivery.  This delivery to include: Vials  90 Days   No safety caps  VIAL medications: Amlodipine '10mg'$ -  1 tablet daily (breakfast) Metoprolol '25mg'$ - 1 tablet daily (bedtime) Irbesartan '300mg'$ - 1 tablet daily (bedtime) Glimepiride '2mg'$  - 2 tablets daily (2- lunch) Metformin '1000mg'$  1 tablet twice daily (1 breakfast, 1 evening meal) Vitamin D 12.50 mcg (50,000 units) - 1 tablet every Friday (breakfast)   Patient declined the following medications this month: none   Any concerns about your medications? No   How often do you forget or accidentally miss a dose? Rarely   Do you use a pillbox? Yes   Is patient in packaging No   Refills requested from providers include: All completed Amlodipine '10mg'$ -  1 tablet daily (breakfast) Metoprolol '25mg'$ - 1 tablet daily (bedtime) Irbesartan '300mg'$ - 1 tablet daily (bedtime) Vitamin D 12.50 mcg (50,000 units) - 1 tablet every Friday (breakfast)  Confirmed delivery date of 03/29/2022, advised patient that pharmacy will contact them the morning of delivery.   Recent blood pressure readings are as follows: Patient was not at home for readings.    Recent blood glucose readings are as follows: Patient was not at home for readings.   Annual wellness visit in last year? No - Patient is scheduled for AWV on 04/13/22 Most Recent BP reading: 154/88 on 01/15/22  If Diabetic: Most recent A1C reading: 7.4 on 10/13/21 Last eye exam / retinopathy screening: 11/18/20 - Patient declined eye exam at [facility] Last diabetic foot exam: 01/13/21  Cycle dispensing form sent to Margaretmary Dys, [place] for review.

## 2022-04-08 ENCOUNTER — Encounter: Payer: Self-pay | Admitting: Podiatry

## 2022-04-08 ENCOUNTER — Ambulatory Visit (INDEPENDENT_AMBULATORY_CARE_PROVIDER_SITE_OTHER): Payer: Medicare Other | Admitting: Podiatry

## 2022-04-08 DIAGNOSIS — E119 Type 2 diabetes mellitus without complications: Secondary | ICD-10-CM

## 2022-04-08 DIAGNOSIS — M79674 Pain in right toe(s): Secondary | ICD-10-CM

## 2022-04-08 DIAGNOSIS — B351 Tinea unguium: Secondary | ICD-10-CM | POA: Diagnosis not present

## 2022-04-08 DIAGNOSIS — M79675 Pain in left toe(s): Secondary | ICD-10-CM

## 2022-04-08 DIAGNOSIS — M129 Arthropathy, unspecified: Secondary | ICD-10-CM

## 2022-04-08 DIAGNOSIS — Z7901 Long term (current) use of anticoagulants: Secondary | ICD-10-CM

## 2022-04-08 DIAGNOSIS — D689 Coagulation defect, unspecified: Secondary | ICD-10-CM | POA: Diagnosis not present

## 2022-04-08 NOTE — Progress Notes (Signed)
Complaint:  Visit Type: This patient presents to the office for at risk foot care patient requires this care by professional since  this patient will be at risk  due to having diabetes.  Patient is unable to cut her own toenails since she cannot reach down.  She presents for at risk foot care today.    Podiatric Exam: Vascular: dorsalis pedis and posterior tibial pulses are palpable bilateral. Capillary return is immediate. Temperature gradient is WNL. Skin turgor WNL  Sensorium: Normal Semmes Weinstein monofilament test. Normal tactile sensation bilaterally. Nail Exam: Pt has thick disfigured discolored nails with subungual debris noted bilateral entire nail hallux.  Nails are thick disfigured and discolored hallux toenails. Ulcer Exam: There is no evidence of ulcer or pre-ulcerative changes or infection. Orthopedic Exam: Muscle tone and strength are WNL. No limitations in general ROM. No crepitus or effusions noted. Foot type and digits show no abnormalities. Bony prominences are unremarkable. Skin: No Porokeratosis. No infection or ulcers  Diagnosis:  Onychomycosis, , Pain in right toe, pain in left toes  Midfoot arthritis.  Treatment & Plan Procedures and Treatment: Consent by patient was obtained for treatment procedures.   Debridement of mycotic and hypertrophic toenails, 1 through 5 bilateral and clearing of subungual debris. No ulceration, no infection noted.  Told the patient to return for periodic foot evaluation to reduce potential  at risk complications   Return Visit-Office Procedure: Patient instructed to return to the office for a follow up visit 3 months for continued evaluation and treatment.    Gardiner Barefoot DPM

## 2022-04-13 ENCOUNTER — Ambulatory Visit: Payer: Medicare Other | Admitting: Primary Care

## 2022-04-13 ENCOUNTER — Ambulatory Visit (INDEPENDENT_AMBULATORY_CARE_PROVIDER_SITE_OTHER): Payer: Medicare Other

## 2022-04-13 VITALS — Wt 123.0 lb

## 2022-04-13 DIAGNOSIS — N390 Urinary tract infection, site not specified: Secondary | ICD-10-CM | POA: Insufficient documentation

## 2022-04-13 DIAGNOSIS — Z0000 Encounter for general adult medical examination without abnormal findings: Secondary | ICD-10-CM

## 2022-04-13 DIAGNOSIS — Z780 Asymptomatic menopausal state: Secondary | ICD-10-CM

## 2022-04-13 DIAGNOSIS — S4452XA Injury of cutaneous sensory nerve at shoulder and upper arm level, left arm, initial encounter: Secondary | ICD-10-CM | POA: Insufficient documentation

## 2022-04-13 DIAGNOSIS — Z87828 Personal history of other (healed) physical injury and trauma: Secondary | ICD-10-CM | POA: Insufficient documentation

## 2022-04-13 DIAGNOSIS — E119 Type 2 diabetes mellitus without complications: Secondary | ICD-10-CM | POA: Insufficient documentation

## 2022-04-13 DIAGNOSIS — G8929 Other chronic pain: Secondary | ICD-10-CM | POA: Insufficient documentation

## 2022-04-13 DIAGNOSIS — M79669 Pain in unspecified lower leg: Secondary | ICD-10-CM | POA: Insufficient documentation

## 2022-04-13 DIAGNOSIS — M79673 Pain in unspecified foot: Secondary | ICD-10-CM | POA: Insufficient documentation

## 2022-04-13 DIAGNOSIS — M542 Cervicalgia: Secondary | ICD-10-CM | POA: Insufficient documentation

## 2022-04-13 HISTORY — DX: Injury of cutaneous sensory nerve at shoulder and upper arm level, left arm, initial encounter: S44.52XA

## 2022-04-13 NOTE — Patient Instructions (Signed)
Morgan Bowen , Thank you for taking time to come for your Medicare Wellness Visit. I appreciate your ongoing commitment to your health goals. Please review the following plan we discussed and let me know if I can assist you in the future.   Screening recommendations/referrals: Colonoscopy: aged out Mammogram: aged out Bone Density: 07/13/19, referral sent Recommended yearly ophthalmology/optometry visit for glaucoma screening and checkup Recommended yearly dental visit for hygiene and checkup  Vaccinations: Influenza vaccine: n/d Pneumococcal vaccine: 10/24/17 Tdap vaccine: 07/14/15 Shingles vaccine: n/d   Covid-19:04/08/20, 04/29/20  Advanced directives: no  Conditions/risks identified: none  Next appointment: Follow up in one year for your annual wellness visit - 04/18/23 @ 9:45 am by phone   Preventive Care 65 Years and Older, Female Preventive care refers to lifestyle choices and visits with your health care provider that can promote health and wellness. What does preventive care include? A yearly physical exam. This is also called an annual well check. Dental exams once or twice a year. Routine eye exams. Ask your health care provider how often you should have your eyes checked. Personal lifestyle choices, including: Daily care of your teeth and gums. Regular physical activity. Eating a healthy diet. Avoiding tobacco and drug use. Limiting alcohol use. Practicing safe sex. Taking low-dose aspirin every day. Taking vitamin and mineral supplements as recommended by your health care provider. What happens during an annual well check? The services and screenings done by your health care provider during your annual well check will depend on your age, overall health, lifestyle risk factors, and family history of disease. Counseling  Your health care provider may ask you questions about your: Alcohol use. Tobacco use. Drug use. Emotional well-being. Home and relationship  well-being. Sexual activity. Eating habits. History of falls. Memory and ability to understand (cognition). Work and work Statistician. Reproductive health. Screening  You may have the following tests or measurements: Height, weight, and BMI. Blood pressure. Lipid and cholesterol levels. These may be checked every 5 years, or more frequently if you are over 40 years old. Skin check. Lung cancer screening. You may have this screening every year starting at age 45 if you have a 30-pack-year history of smoking and currently smoke or have quit within the past 15 years. Fecal occult blood test (FOBT) of the stool. You may have this test every year starting at age 24. Flexible sigmoidoscopy or colonoscopy. You may have a sigmoidoscopy every 5 years or a colonoscopy every 10 years starting at age 33. Hepatitis C blood test. Hepatitis B blood test. Sexually transmitted disease (STD) testing. Diabetes screening. This is done by checking your blood sugar (glucose) after you have not eaten for a while (fasting). You may have this done every 1-3 years. Bone density scan. This is done to screen for osteoporosis. You may have this done starting at age 6. Mammogram. This may be done every 1-2 years. Talk to your health care provider about how often you should have regular mammograms. Talk with your health care provider about your test results, treatment options, and if necessary, the need for more tests. Vaccines  Your health care provider may recommend certain vaccines, such as: Influenza vaccine. This is recommended every year. Tetanus, diphtheria, and acellular pertussis (Tdap, Td) vaccine. You may need a Td booster every 10 years. Zoster vaccine. You may need this after age 15. Pneumococcal 13-valent conjugate (PCV13) vaccine. One dose is recommended after age 12. Pneumococcal polysaccharide (PPSV23) vaccine. One dose is recommended after age 16. Talk  to your health care provider about which  screenings and vaccines you need and how often you need them. This information is not intended to replace advice given to you by your health care provider. Make sure you discuss any questions you have with your health care provider. Document Released: 08/22/2015 Document Revised: 04/14/2016 Document Reviewed: 05/27/2015 Elsevier Interactive Patient Education  2017 [place] Prevention in the Home Falls can cause injuries. They can happen to people of all ages. There are many things you can do to make your home safe and to help prevent falls. What can I do on the outside of my home? Regularly fix the edges of walkways and driveways and fix any cracks. Remove anything that might make you trip as you walk through a door, such as a raised step or threshold. Trim any bushes or trees on the path to your home. Use bright outdoor lighting. Clear any walking paths of anything that might make someone trip, such as rocks or tools. Regularly check to see if handrails are loose or broken. Make sure that both sides of any steps have handrails. Any raised decks and porches should have guardrails on the edges. Have any leaves, snow, or ice cleared regularly. Use sand or salt on walking paths during winter. Clean up any spills in your garage right away. This includes oil or grease spills. What can I do in the bathroom? Use night lights. Install grab bars by the toilet and in the tub and shower. Do not use towel bars as grab bars. Use non-skid mats or decals in the tub or shower. If you need to sit down in the shower, use a plastic, non-slip stool. Keep the floor dry. Clean up any water that spills on the floor as soon as it happens. Remove soap buildup in the tub or shower regularly. Attach bath mats securely with double-sided non-slip rug tape. Do not have throw rugs and other things on the floor that can make you trip. What can I do in the bedroom? Use night lights. Make sure that you have a  light by your bed that is easy to reach. Do not use any sheets or blankets that are too big for your bed. They should not hang down onto the floor. Have a firm chair that has side arms. You can use this for support while you get dressed. Do not have throw rugs and other things on the floor that can make you trip. What can I do in the kitchen? Clean up any spills right away. Avoid walking on wet floors. Keep items that you use a lot in easy-to-reach places. If you need to reach something above you, use a strong step stool that has a grab bar. Keep electrical cords out of the way. Do not use floor polish or wax that makes floors slippery. If you must use wax, use non-skid floor wax. Do not have throw rugs and other things on the floor that can make you trip. What can I do with my stairs? Do not leave any items on the stairs. Make sure that there are handrails on both sides of the stairs and use them. Fix handrails that are broken or loose. Make sure that handrails are as long as the stairways. Check any carpeting to make sure that it is firmly attached to the stairs. Fix any carpet that is loose or worn. Avoid having throw rugs at the top or bottom of the stairs. If you do have throw rugs,  attach them to the floor with carpet tape. Make sure that you have a light switch at the top of the stairs and the bottom of the stairs. If you do not have them, ask someone to add them for you. What else can I do to help prevent falls? Wear shoes that: Do not have high heels. Have rubber bottoms. Are comfortable and fit you well. Are closed at the toe. Do not wear sandals. If you use a stepladder: Make sure that it is fully opened. Do not climb a closed stepladder. Make sure that both sides of the stepladder are locked into place. Ask someone to hold it for you, if possible. Clearly mark and make sure that you can see: Any grab bars or handrails. First and last steps. Where the edge of each step  is. Use tools that help you move around (mobility aids) if they are needed. These include: Canes. Walkers. Scooters. Crutches. Turn on the lights when you go into a dark area. Replace any light bulbs as soon as they burn out. Set up your furniture so you have a clear path. Avoid moving your furniture around. If any of your floors are uneven, fix them. If there are any pets around you, be aware of where they are. Review your medicines with your doctor. Some medicines can make you feel dizzy. This can increase your chance of falling. Ask your doctor what other things that you can do to help prevent falls. This information is not intended to replace advice given to you by your health care provider. Make sure you discuss any questions you have with your health care provider. Document Released: 05/22/2009 Document Revised: 01/01/2016 Document Reviewed: 08/30/2014 Elsevier Interactive Patient Education  2017 [employer].

## 2022-04-13 NOTE — Progress Notes (Signed)
Virtual Visit via Telephone Note  I connected with  Morgan Bowen on 04/13/22 at  9:30 AM EDT by telephone and verified that I am speaking with the correct person using two identifiers.  Location: Patient: home Provider: Kathleen Persons participating in the virtual visit: [place]   I discussed the limitations, risks, security and privacy concerns of performing an evaluation and management service by telephone and the availability of in person appointments. The patient expressed understanding and agreed to proceed.  Interactive audio and video telecommunications were attempted between this nurse and patient, however failed, due to patient having technical difficulties OR patient did not have access to video capability.  We continued and completed visit with audio only.  Some vital signs may be absent or patient reported.   Morgan David, LPN  Subjective:   Morgan Bowen is a 86 y.o. female who presents for Medicare Annual (Subsequent) preventive examination.  Review of Systems     Cardiac Risk Factors include: advanced age (>63mn, >>66women);diabetes mellitus;hypertension     Objective:    There were no vitals filed for this visit. There is no height or weight on file to calculate BMI.     04/13/2022    9:40 AM 01/11/2022    6:30 PM 05/16/2020   11:48 AM 04/13/2020    9:16 PM 04/12/2020    8:37 AM 04/11/2020   11:33 PM 04/01/2020    1:28 PM  Advanced Directives  Does Patient Have a Medical Advance Directive? No No Yes Yes Yes  Yes  Type of ATax inspectorPower of AOrosiLiving will  Does patient want to make changes to medical advance directive?   No - Guardian declined No - Patient declined No - Patient declined    Copy of [place] Chart?    No - copy requested No - copy requested No - copy requested No - copy requested  Would  patient like information on creating a medical advance directive? No - Patient declined   No - Patient declined No - Patient declined      Current Medications (verified) Outpatient Encounter Medications as of 04/13/2022  Medication Sig   amLODipine (NORVASC) 10 MG tablet TAKE ONE TABLET BY MOUTH ONCE DAILY FOR BLOOD PRESSURE. Office visit required for further refills.   Calcium-Magnesium-Vitamin D [id number]-MG-UNIT TABS Take 1 tablet by mouth daily.   cloNIDine (CATAPRES) 0.1 MG tablet Take 1 tablet by mouth once daily as needed for systolic blood pressure greater than 170.   ethyl chloride spray APPLY TO AFFECTED AREA AS NEEDED   glimepiride (AMARYL) 2 MG tablet Take 1 tablet (2 mg total) by mouth 2 (two) times daily with a meal. for diabetes.   glucose blood (ONE TOUCH ULTRA TEST) test strip USE AS DIRECTED TEST 3 TIMES DAILY E11.9   hydrocortisone (ANUSOL-HC) 25 MG suppository REMOVE FOIL & INSERT 1 SUPPOSITORY IN RECTUM EVERY 12 HOURS AS DIRECTED   irbesartan (AVAPRO) 300 MG tablet Take 1 tablet (300 mg total) by mouth daily. for blood pressure. Office visit required for further refills.   metFORMIN (GLUCOPHAGE) 1000 MG tablet TAKE ONE TABLET BY MOUTH TWICE DAILY WITH A MEAL FOR DIABETES   metFORMIN (GLUCOPHAGE-XR) 500 MG 24 hr tablet TAKE 2 TABLETS BY MOUTH DAILY AT BEDTIME   metoprolol succinate (TOPROL-XL) 25 MG 24 hr tablet TAKE ONE TABLET BY MOUTH  ONCE DAILY FOR blood pressure. Office visit required for further refills.   mupirocin cream (BACTROBAN) 2 %    nystatin cream (MYCOSTATIN)    olmesartan (BENICAR) 20 MG tablet Take 1 tablet every day by oral route.   triamcinolone cream (KENALOG) 0.1 % APPLY TO AFFECTED AREA TWICE A DAY FOR ITCH UNTIL RESOLVED   Vitamin D, Ergocalciferol, (DRISDOL) 1.25 MG (50000 UNIT) CAPS capsule TAKE ONE CAPSULE BY MOUTH ONCE A WEEK. Office visit required for further refills.   Zoster Vaccine Live, PF, (ZOSTAVAX) 78469 UNT/0.65ML injection     azithromycin (ZITHROMAX) 250 MG tablet Take by oral route. (Patient not taking: Reported on 04/13/2022)   cephALEXin (KEFLEX) 500 MG capsule Take 1 capsule (500 mg total) by mouth 2 (two) times daily. (Patient not taking: Reported on 04/13/2022)   hydrochlorothiazide (MICROZIDE) 12.5 MG capsule Take 1 capsule by mouth daily. (Patient not taking: Reported on 04/13/2022)   ibandronate (BONIVA) 150 MG tablet  (Patient not taking: Reported on 04/13/2022)   LORazepam (ATIVAN) 1 MG tablet TAKE 1 TABLET BY MOUTH 3 TIMES DAILY IF NEEDED (Patient not taking: Reported on 04/13/2022)   tiZANidine (ZANAFLEX) 2 MG tablet Take 1 tablet by mouth 2 (two) times daily. (Patient not taking: Reported on 04/13/2022)   valsartan (DIOVAN) 320 MG tablet Take 1 tablet by mouth daily. (Patient not taking: Reported on 04/13/2022)   No facility-administered encounter medications on file as of 04/13/2022.    Allergies (verified) Sulfamethoxazole-trimethoprim and Penicillins   History: Past Medical History:  Diagnosis Date   Acute cystitis 05/23/2018   Anxiety    Arthritis    Cellulitis 12/13/2018   Chickenpox    Diabetes mellitus without complication (HCC)    GERD (gastroesophageal reflux disease)    Hypertension    Osteoporosis    Type 2 diabetes mellitus ([place])    Urinary tract infection    Past Surgical History:  Procedure Laterality Date   ABDOMINAL HYSTERECTOMY  1984   ABDOMINAL HYSTERECTOMY     APPENDECTOMY  1958   CHOLECYSTECTOMY     GALLBLADDER SURGERY     MANDIBLE FRACTURE SURGERY  07/13/2016   TONSILLECTOMY  1958   TONSILLECTOMY     WRIST FRACTURE SURGERY Right    2000   Family History  Problem Relation Age of Onset   Hypertension Mother    Stroke Mother    Stroke Father    Hypertension Father    Hypertension Sister    Kidney disease Sister    Breast cancer Neg Hx    Social History   Socioeconomic History   Marital status: Widowed    Spouse name: Not on file   Number of children: Not on file    Years of education: Not on file   Highest education level: Not on file  Occupational History   Not on file  Tobacco Use   Smoking status: Never   Smokeless tobacco: Never  Vaping Use   Vaping Use: Never used  Substance and Sexual Activity   Alcohol use: No   Drug use: No   Sexual activity: Not Currently  Other Topics Concern   Not on file  Social History Narrative   ** Merged History Encounter **       Widowed. Moved from [place]. 1 son, 3 grandchildren. Retired. Once worked as a Agricultural engineer.    Social Determinants of Health   Financial Resource Strain: Low Risk  (04/13/2022)   Overall Financial Resource Strain (CARDIA)    Difficulty of Paying  Living Expenses: Not hard at all  Food Insecurity: No Food Insecurity (04/13/2022)   Hunger Vital Sign    Worried About Running Out of Food in the Last Year: Never true    Ran Out of Food in the Last Year: Never true  Transportation Needs: No Transportation Needs (04/13/2022)   PRAPARE - Hydrologist (Medical): No    Lack of Transportation (Non-Medical): No  Physical Activity: Insufficiently Active (04/13/2022)   Exercise Vital Sign    Days of Exercise per Week: 2 days    Minutes of Exercise per Session: 20 min  Stress: No Stress Concern Present (04/13/2022)   [place]    Feeling of Stress : Not at all  Social Connections: Unknown (04/13/2022)   Social Connection and Isolation Panel [NHANES]    Frequency of Communication with Friends and Family: More than three times a week    Frequency of Social Gatherings with Friends and Family: More than three times a week    Attends Religious Services: Never    Marine scientist or Organizations: No    Attends Music therapist: Never    Marital Status: Not on file    Tobacco Counseling Counseling given: Not Answered   Clinical Intake:  Pre-visit preparation completed:  Yes  Pain : No/denies pain     Nutritional Risks: None Diabetes: Yes CBG done?: No Did pt. bring in CBG monitor from home?: No  How often do you need to have someone help you when you read instructions, pamphlets, or other written materials from your doctor or pharmacy?: 1 - Never  Diabetic?yes Nutrition Risk Assessment:  Has the patient had any N/V/D within the last 2 months?  No  Does the patient have any non-healing wounds?  No  Has the patient had any unintentional weight loss or weight gain?  No   Diabetes:  Is the patient diabetic?  Yes  If diabetic, was a CBG obtained today?  No  Did the patient bring in their glucometer from home?  No  How often do you monitor your CBG's? daily.   Financial Strains and Diabetes Management:  Are you having any financial strains with the device, your supplies or your medication? No .  Does the patient want to be seen by Chronic Care Management for management of their diabetes?  No  Would the patient like to be referred to a Nutritionist or for Diabetic Management?  No   Diabetic Exams:  Diabetic Eye Exam: Completed 11/18/20. Overdue for diabetic eye exam. Pt has been advised about the importance in completing this exam.  Diabetic Foot Exam: Completed 01/13/21. Pt has been advised about the importance in completing this exam.   Interpreter Needed?: No  Information entered by :: Kirke Shaggy, LPN   Activities of Daily Living    04/13/2022    9:41 AM 04/09/2022    7:05 PM  In your present state of health, do you have any difficulty performing the following activities:  Hearing? 0 0  Vision? 0 0  Difficulty concentrating or making decisions? 0 0  Walking or climbing stairs? 1 0  Dressing or bathing? 0 0  Doing errands, shopping? 0 0  Preparing Food and eating ? N N  Using the Toilet? N N  In the past six months, have you accidently leaked urine? N N  Do you have problems with loss of bowel control? N N  Managing your  Medications? N N  Managing your Finances? N N  Housekeeping or managing your Housekeeping? N N    Patient Care Team: [name], NP as PCP - General (Internal Medicine) Carlis Abbott Leticia Penna, NP (Internal Medicine) [name], [facility] as Pharmacist (Pharmacist)  Indicate any recent Medical Services you may have received from other than [facility] providers in the past year (date may be approximate).     Assessment:   This is a routine wellness examination for [place].  Hearing/Vision screen Hearing Screening - Comments:: No aids Vision Screening - Comments:: Wears glasses- Dr.Richardson  Dietary issues and exercise activities discussed: Current Exercise Habits: Home exercise routine, Type of exercise: walking, Time (Minutes): 20, Frequency (Times/Week): 2, Weekly Exercise (Minutes/Week): 40, Intensity: Mild   Goals Addressed             This Visit's Progress    DIET - EAT MORE FRUITS AND VEGETABLES         Depression Screen    04/13/2022    9:38 AM 10/13/2021    9:25 AM 04/14/2021    9:08 AM 04/01/2020    1:31 PM 05/15/2019    8:19 AM 03/27/2019   12:10 PM 03/16/2018    4:36 PM  PHQ 2/9 Scores  PHQ - 2 Score 0 0 3 0 0 0 0  PHQ- 9 Score 0 0 5 0 2 0 0    Fall Risk    04/13/2022    9:41 AM 04/09/2022    7:05 PM 04/14/2021    9:08 AM 10/07/2020   10:42 AM 04/01/2020    1:30 PM  Fall Risk   Falls in the past year? 1 1 0 0 0  Number falls in past yr: 0 1 0 0 0  Injury with Fall? 0 0  0 0  Risk for fall due to : History of fall(s)    History of fall(s);Medication side effect  Follow up Falls prevention discussed    Falls evaluation completed;Falls prevention discussed    FALL RISK PREVENTION PERTAINING TO THE HOME:  Any stairs in or around the home? No  If so, are there any without handrails? No  Home free of loose throw rugs in walkways, pet beds, electrical cords, etc? Yes  Adequate lighting in your home to reduce risk of falls? Yes   ASSISTIVE DEVICES UTILIZED TO PREVENT  FALLS:  Life alert? Yes  Use of a cane, walker or w/c? Yes  Grab bars in the bathroom? No  Shower chair or bench in shower? Yes  Elevated toilet seat or a handicapped toilet? Yes    Cognitive Function:      04/01/2020    1:33 PM 03/27/2019   12:13 PM 03/16/2018    4:36 PM  MMSE - Mini Mental State Exam  Orientation to time '5 5 5  '$ Orientation to Place '5 5 5  '$ Registration '3 3 3  '$ Attention/ Calculation 5 5 0  Recall '3 3 3  '$ Language- name 2 objects  0 0  Language- repeat '1 1 1  '$ Language- follow 3 step command  0 3  Language- read & follow direction  0 0  Write a sentence  0 0  Copy design  0 0  Total score  22 20        04/13/2022    9:44 AM  6CIT Screen  What Year? 0 points  What month? 0 points  What time? 0 points  Count back from 20 0 points  Months in reverse  2 points  Repeat phrase 4 points  Total Score 6 points    Immunizations Immunization History  Administered Date(s) Administered   Influenza, High Dose Seasonal PF 05/09/2012   Influenza,inj,Quad PF,6+ Mos 03/30/2019   PFIZER(Purple Top)SARS-COV-2 Vaccination 04/08/2020, 04/29/2020   Pneumococcal Conjugate-13 10/24/2017   Pneumococcal Polysaccharide-23 08/23/2013   Tdap 07/14/2015   Zoster, Live 08/06/2013, 12/08/2014    TDAP status: Up to date  Flu Vaccine status: Declined, Education has been provided regarding the importance of this vaccine but patient still declined. Advised may receive this vaccine at local pharmacy or Health Dept. Aware to provide a copy of the vaccination record if obtained from local pharmacy or Health Dept. Verbalized acceptance and understanding.  Pneumococcal vaccine status: Up to date  Covid-19 vaccine status: Completed vaccines  Qualifies for Shingles Vaccine? Yes   Zostavax completed Yes   Shingrix Completed?: No.    Education has been provided regarding the importance of this vaccine. Patient has been advised to call insurance company to determine out of pocket expense  if they have not yet received this vaccine. Advised may also receive vaccine at local pharmacy or Health Dept. Verbalized acceptance and understanding.  Screening Tests Health Maintenance  Topic Date Due   Zoster Vaccines- Shingrix (1 of 2) Never done   COVID-19 Vaccine (3 - Pfizer risk series) 05/27/2020   OPHTHALMOLOGY EXAM  11/18/2021   FOOT EXAM  01/13/2022   INFLUENZA VACCINE  03/09/2022   HEMOGLOBIN A1C  04/15/2022   TETANUS/TDAP  07/13/2025   Pneumonia Vaccine 98+ Years old  Completed   DEXA SCAN  Completed   HPV VACCINES  Aged Out    Health Maintenance  Health Maintenance Due  Topic Date Due   Zoster Vaccines- Shingrix (1 of 2) Never done   COVID-19 Vaccine (3 - Pfizer risk series) 05/27/2020   OPHTHALMOLOGY EXAM  11/18/2021   FOOT EXAM  01/13/2022   INFLUENZA VACCINE  03/09/2022    Colorectal cancer screening: No longer required.   Mammogram status: No longer required due to age.  Bone Density status: Completed 07/10/19. Results reflect: Bone density results: OSTEOPOROSIS. Repeat every 2 years.  Lung Cancer Screening: (Low Dose CT Chest recommended if Age 60-80 years, 30 pack-year currently smoking OR have quit w/in 15years.) does not qualify.   Additional Screening:  Hepatitis C Screening: does not qualify; Completed no  Vision Screening: Recommended annual ophthalmology exams for early detection of glaucoma and other disorders of the eye. Is the patient up to date with their annual eye exam?  Yes  Who is the provider or what is the name of the office in which the patient attends annual eye exams? Dr.Richardson If pt is not established with a provider, would they like to be referred to a provider to establish care? No .   Dental Screening: Recommended annual dental exams for proper oral hygiene  Community Resource Referral / Chronic Care Management: CRR required this visit?  No   CCM required this visit?  No      Plan:     I have personally reviewed  and noted the following in the patient's chart:   Medical and social history Use of alcohol, tobacco or illicit drugs  Current medications and supplements including opioid prescriptions. Patient is not currently taking opioid prescriptions. Functional ability and status Nutritional status Physical activity Advanced directives List of other physicians Hospitalizations, surgeries, and ER visits in previous 12 months Vitals Screenings to include cognitive, depression, and falls Referrals and appointments  In addition, I have reviewed and discussed with patient certain preventive protocols, quality metrics, and best practice recommendations. A written personalized care plan for preventive services as well as general preventive health recommendations were provided to patient.     Morgan David, LPN   09/09/[id number]   Nurse Notes: none

## 2022-04-13 NOTE — Addendum Note (Signed)
Addended by: Dionisio David on: 04/13/2022 10:27 AM   Modules accepted: Orders

## 2022-04-21 ENCOUNTER — Ambulatory Visit
Admission: EM | Admit: 2022-04-21 | Discharge: 2022-04-21 | Disposition: A | Discharge status: Home / Self Care | Payer: Medicare Other | Attending: Emergency Medicine | Admitting: Emergency Medicine

## 2022-04-21 DIAGNOSIS — R319 Hematuria, unspecified: Secondary | ICD-10-CM | POA: Insufficient documentation

## 2022-04-21 DIAGNOSIS — N390 Urinary tract infection, site not specified: Secondary | ICD-10-CM | POA: Insufficient documentation

## 2022-04-21 LAB — POCT URINALYSIS DIP (MANUAL ENTRY)
Bilirubin, UA: NEGATIVE
Glucose, UA: NEGATIVE mg/dL
Nitrite, UA: POSITIVE — AB
Protein Ur, POC: 100 mg/dL — AB
Spec Grav, UA: 1.025 (ref 1.010–1.025)
Urobilinogen, UA: 0.2 E.U./dL
pH, UA: 5.5 (ref 5.0–8.0)

## 2022-04-21 MED ORDER — CEPHALEXIN 500 MG PO CAPS: 500.0000 mg | ORAL_CAPSULE | Freq: Two times a day (BID) | ORAL | 0 refills | Status: DC

## 2022-04-21 NOTE — ED Triage Notes (Signed)
Pt. States that she has been experiencing urinary frequency and burning for the past day.

## 2022-04-21 NOTE — ED Provider Notes (Signed)
Morgan Bowen    CSN: [id number] Arrival date & time: 04/21/22  1205      History   Chief Complaint Chief Complaint  Patient presents with   Urinary Frequency    HPI Morgan Bowen is a 86 y.o. female.  Patient presents with 1 day history of dysuria and urinary frequency.  She denies fever, chills, abdominal pain, hematuria, flank pain, vaginal discharge, pelvic pain, or other symptoms.  Treatment at home with vinegar water.  Her medical history includes diabetes, hypertension, DVT, cystitis, GERD, osteoporosis, anxiety and depression.  The history is provided by the patient and medical records.    Past Medical History:  Diagnosis Date   Acute cystitis 05/23/2018   Anxiety    Arthritis    Cellulitis 12/13/2018   Chickenpox    Diabetes mellitus without complication ([place])    GERD (gastroesophageal reflux disease)    Hypertension    Osteoporosis    Type 2 diabetes mellitus ([place])    Urinary tract infection     Patient Active Problem List   Diagnosis Date Noted   History of laceration of skin 04/13/2022   Laceration of skin and cutaneous sensory nerve of left upper extremity 04/13/2022   Neck pain 04/13/2022   Urinary tract infectious disease 04/13/2022   Foot pain 04/13/2022   Calf pain 04/13/2022   Diabetes mellitus ([place]) 04/13/2022   Dizziness 01/15/2022   Open wound of skin 01/15/2022   Pain in joint of right knee 12/31/2021   Sinus tachycardia 06/16/2021   Blood coagulation defect ([place]) 12/18/2020   On apixaban therapy 04/21/2020   Palpitations 04/21/2020   Acute deep vein thrombosis (DVT) of right tibial vein ([place]) 04/03/2020   Pain due to onychomycosis of toenails of both feet 01/22/2019   Vitamin D deficiency 06/26/2018   Decreased renal function 03/21/2018   Non-traumatic compression fracture of lumbar vertebra with routine healing 08/26/2017   Anxiety and depression 08/26/2017   Type 2 diabetes mellitus with other specified complication ([place])  54/27/0623   Essential hypertension 04/27/2017   Osteoporosis 04/27/2017   Bronchitis 06/17/2015   Depressive disorder 06/17/2015    Past Surgical History:  Procedure Laterality Date   [place]  07/13/2016   TONSILLECTOMY  1958   TONSILLECTOMY     WRIST FRACTURE SURGERY Right    2000    OB History   No obstetric history on file.      Home Medications    Prior to Admission medications   Medication Sig Start Date End Date Taking? Authorizing Provider  cephALEXin (KEFLEX) 500 MG capsule Take 1 capsule (500 mg total) by mouth 2 (two) times daily for 5 days. 04/21/22 04/26/22 Yes Sharion Balloon, NP  amLODipine (NORVASC) 10 MG tablet TAKE ONE TABLET BY MOUTH ONCE DAILY FOR BLOOD PRESSURE. Office visit required for further refills. 03/17/22   [name], NP  Calcium-Magnesium-Vitamin D [phone] MG-MG-UNIT TABS Take 1 tablet by mouth daily.    [provider]  cloNIDine (CATAPRES) 0.1 MG tablet Take 1 tablet by mouth once daily as needed for systolic blood pressure greater than 170. 10/24/20   [name], NP  ethyl chloride spray APPLY TO AFFECTED AREA AS NEEDED    [provider]  glimepiride (AMARYL) 2 MG tablet Take 1 tablet (2 mg total) by  mouth 2 (two) times daily with a meal. for diabetes. 12/17/21   [name], NP  glucose blood (ONE TOUCH ULTRA TEST) test strip USE AS DIRECTED TEST 3 TIMES DAILY E11.9 01/18/19   [name], NP  hydrochlorothiazide (MICROZIDE) 12.5 MG capsule Take 1 capsule by mouth daily. Patient not taking: Reported on 04/13/2022    [provider]  hydrocortisone (ANUSOL-HC) 25 MG suppository REMOVE FOIL & INSERT 1 SUPPOSITORY IN RECTUM EVERY 12 HOURS AS DIRECTED    [provider]  ibandronate (BONIVA) 150 MG tablet     [provider]  irbesartan  (AVAPRO) 300 MG tablet Take 1 tablet (300 mg total) by mouth daily. for blood pressure. Office visit required for further refills. 03/17/22   [name], NP  LORazepam (ATIVAN) 1 MG tablet TAKE 1 TABLET BY MOUTH 3 TIMES DAILY IF NEEDED Patient not taking: Reported on 04/13/2022    [provider]  metFORMIN (GLUCOPHAGE) 1000 MG tablet TAKE ONE TABLET BY MOUTH TWICE DAILY WITH A MEAL FOR DIABETES 12/17/21   [name], NP  metFORMIN (GLUCOPHAGE-XR) 500 MG 24 hr tablet TAKE 2 TABLETS BY MOUTH DAILY AT BEDTIME    [provider]  metoprolol succinate (TOPROL-XL) 25 MG 24 hr tablet TAKE ONE TABLET BY MOUTH ONCE DAILY FOR blood pressure. Office visit required for further refills. 03/17/22   [name], NP  mupirocin cream (BACTROBAN) 2 %     [provider]  nystatin cream (MYCOSTATIN)     [provider]  olmesartan (BENICAR) 20 MG tablet Take 1 tablet every day by oral route. 08/07/13   [provider]  tiZANidine (ZANAFLEX) 2 MG tablet Take 1 tablet by mouth 2 (two) times daily. Patient not taking: Reported on 04/13/2022    [provider]  triamcinolone cream (KENALOG) 0.1 % APPLY TO AFFECTED AREA TWICE A DAY FOR ITCH UNTIL RESOLVED 06/30/16   [provider]  valsartan (DIOVAN) 320 MG tablet Take 1 tablet by mouth daily. Patient not taking: Reported on 04/13/2022 02/21/17   [provider]  Vitamin D, Ergocalciferol, (DRISDOL) 1.25 MG (50000 UNIT) CAPS capsule TAKE ONE CAPSULE BY MOUTH ONCE A WEEK. Office visit required for further refills. 03/17/22   [name], NP  Zoster Vaccine Live, PF, (ZOSTAVAX) 46568 UNT/0.65ML injection     [provider]    Family History Family History  Problem Relation Age of Onset   Hypertension Mother    Stroke Mother    Stroke Father    Hypertension Father    Hypertension Sister    Kidney disease Sister    Breast cancer Neg Hx     Social History Social  History   Tobacco Use   Smoking status: Never   Smokeless tobacco: Never  Vaping Use   Vaping Use: Never used  Substance Use Topics   Alcohol use: No   Drug use: No     Allergies   Sulfamethoxazole-trimethoprim and Penicillins   Review of Systems Review of Systems  Constitutional:  Negative for chills and fever.  Gastrointestinal:  Negative for abdominal pain, nausea and vomiting.  Genitourinary:  Positive for dysuria and frequency. Negative for flank pain, hematuria, pelvic pain and vaginal discharge.  Skin:  Negative for color change and rash.  All other systems reviewed and are negative.    Physical Exam Triage Vital Signs ED Triage Vitals [04/21/22 1218]  Enc Vitals Group     BP  Pulse      Resp      Temp      Temp src      SpO2      Weight      Height      Head Circumference      Peak Flow      Pain Score 0     Pain Loc      Pain Edu?      Excl. in [place]?    No data found.  Updated Vital Signs BP (!) 157/79   Pulse 87   Temp 97.9 F (36.6 C)   Resp 17   SpO2 97%   Visual Acuity Right Eye Distance:   Left Eye Distance:   Bilateral Distance:    Right Eye Near:   Left Eye Near:    Bilateral Near:     Physical Exam Vitals and nursing note reviewed.  Constitutional:      General: She is not in acute distress.    Appearance: She is well-developed. She is not ill-appearing.  HENT:     Mouth/Throat:     Mouth: Mucous membranes are moist.  Cardiovascular:     Rate and Rhythm: Normal rate and regular rhythm.  Pulmonary:     Effort: Pulmonary effort is normal. No respiratory distress.  Abdominal:     General: Bowel sounds are normal.     Palpations: Abdomen is soft.     Tenderness: There is no abdominal tenderness. There is no right CVA tenderness, left CVA tenderness, guarding or rebound.  Musculoskeletal:     Cervical back: Neck supple.  Skin:    General: Skin is warm and dry.  Neurological:     Mental Status: She is alert.   Psychiatric:        Mood and Affect: Mood normal.        Behavior: Behavior normal.      UC Treatments / Results  Labs (all labs ordered are listed, but only abnormal results are displayed) Labs Reviewed  POCT URINALYSIS DIP (MANUAL ENTRY) - Abnormal; Notable for the following components:      Result Value   Clarity, UA cloudy (*)    Ketones, POC UA trace (5) (*)    Blood, UA moderate (*)    Protein Ur, POC =100 (*)    Nitrite, UA Positive (*)    Leukocytes, UA Large (3+) (*)    All other components within normal limits  URINE CULTURE    EKG   Radiology No results found.  Procedures Procedures (including critical care time)  Medications Ordered in UC Medications - No data to display  Initial Impression / Assessment and Plan / UC Course  I have reviewed the triage vital signs and the nursing notes.  Pertinent labs & imaging results that were available during my care of the patient were reviewed by me and considered in my medical decision making (see chart for details).    UTI.  Treating with Keflex. Urine culture pending. Discussed with patient that we will call her if the urine culture shows the need to change or discontinue the antibiotic. Instructed her to follow-up with her PCP if her symptoms are not improving. Patient agrees to plan of care.     Final Clinical Impressions(s) / UC Diagnoses   Final diagnoses:  Urinary tract infection with hematuria, site unspecified     Discharge Instructions      Take the antibiotic as directed.  The urine culture is pending.  We will call you if it shows the need to change or discontinue your antibiotic.    Follow up with your primary care provider if your symptoms are not improving.        ED Prescriptions     Medication Sig Dispense Auth. Provider   cephALEXin (KEFLEX) 500 MG capsule Take 1 capsule (500 mg total) by mouth 2 (two) times daily for 5 days. 10 capsule Sharion Balloon, NP      PDMP not reviewed  this encounter.   Sharion Balloon, NP 04/21/22 1252

## 2022-04-21 NOTE — Discharge Instructions (Addendum)
Take the antibiotic as directed.  The urine culture is pending.  We will call you if it shows the need to change or discontinue your antibiotic.    Follow up with your primary care provider if your symptoms are not improving.    

## 2022-04-25 LAB — URINE CULTURE: Culture: >=100000 — AB

## 2022-04-26 ENCOUNTER — Ambulatory Visit (INDEPENDENT_AMBULATORY_CARE_PROVIDER_SITE_OTHER): Payer: Medicare Other | Admitting: Primary Care

## 2022-04-26 ENCOUNTER — Encounter: Payer: Self-pay | Admitting: Primary Care

## 2022-04-26 VITALS — BP 124/68 | HR 73 | Temp 97.2°F | Ht 65.0 in | Wt 123.0 lb

## 2022-04-26 DIAGNOSIS — M810 Age-related osteoporosis without current pathological fracture: Secondary | ICD-10-CM | POA: Diagnosis not present

## 2022-04-26 DIAGNOSIS — R000 Tachycardia, unspecified: Secondary | ICD-10-CM | POA: Diagnosis not present

## 2022-04-26 DIAGNOSIS — F32A Depression, unspecified: Secondary | ICD-10-CM

## 2022-04-26 DIAGNOSIS — Z780 Asymptomatic menopausal state: Secondary | ICD-10-CM

## 2022-04-26 DIAGNOSIS — N289 Disorder of kidney and ureter, unspecified: Secondary | ICD-10-CM | POA: Diagnosis not present

## 2022-04-26 DIAGNOSIS — E1169 Type 2 diabetes mellitus with other specified complication: Secondary | ICD-10-CM | POA: Diagnosis not present

## 2022-04-26 DIAGNOSIS — E559 Vitamin D deficiency, unspecified: Secondary | ICD-10-CM

## 2022-04-26 DIAGNOSIS — F419 Anxiety disorder, unspecified: Secondary | ICD-10-CM | POA: Diagnosis not present

## 2022-04-26 DIAGNOSIS — I10 Essential (primary) hypertension: Secondary | ICD-10-CM

## 2022-04-26 DIAGNOSIS — G8929 Other chronic pain: Secondary | ICD-10-CM

## 2022-04-26 DIAGNOSIS — M79673 Pain in unspecified foot: Secondary | ICD-10-CM | POA: Diagnosis not present

## 2022-04-26 LAB — LIPID PANEL
Cholesterol: 198 mg/dL (ref 0–200)
HDL: 49 mg/dL (ref >39.00)
LDL Cholesterol: 121 mg/dL — ABNORMAL HIGH (ref 0–99)
NonHDL: 149.18
Total CHOL/HDL Ratio: 4
Triglycerides: 142 mg/dL (ref 0.0–149.0)
VLDL: 28.4 mg/dL (ref 0.0–40.0)

## 2022-04-26 LAB — VITAMIN D 25 HYDROXY (VIT D DEFICIENCY, FRACTURES): VITD: 63.15 ng/mL (ref 30.00–100.00)

## 2022-04-26 LAB — HEMOGLOBIN A1C: Hgb A1c MFr Bld: 8.1 % — ABNORMAL HIGH (ref 4.6–6.5)

## 2022-04-26 LAB — URIC ACID: Uric Acid, Serum: 6 mg/dL (ref 2.4–7.0)

## 2022-04-26 MED ORDER — GABAPENTIN 100 MG PO CAPS: 100.0000 mg | ORAL_CAPSULE | Freq: Every day | ORAL | 0 refills | Status: DC

## 2022-04-26 NOTE — Assessment & Plan Note (Signed)
Controlled.  Continue metoprolol succinate 25 mg daily. 

## 2022-04-26 NOTE — Patient Instructions (Signed)
Stop by the lab prior to leaving today. I will notify you of your results once received.   Call the breast center to schedule your bone density scan.  Start gabapentin 100 mg for foot pain.  Take 1 capsule by mouth at bedtime.  Please update me in a few weeks.  It was a pleasure to see you today!

## 2022-04-26 NOTE — Assessment & Plan Note (Signed)
Reviewed renal function from May 2023    Discouraged use of Ibuprofen.

## 2022-04-26 NOTE — Assessment & Plan Note (Signed)
Repeat A1C pending.  Poor diet, no regular exercise. Discussed to work on both. Continue metformin 1000 mg BID, glimepiride 2 mg BID.  Consider addition of GLP 1 antagonist vs DDP 4 inhibitor.  Managed on ARB.  Pneumonia vaccine UTD. Follows with podiatry.

## 2022-04-26 NOTE — Assessment & Plan Note (Signed)
Overdue for bone density scan, she agrees to have done. Orders placed.  She continues to decline Rx treatment for osteoporosis.  Continue vitamin D 50,000 IU weekly, daily calcium.  Encouraged daily walking.

## 2022-04-26 NOTE — Assessment & Plan Note (Addendum)
Controlled.  Continue amlodipine 10 mg daily, irbesartan 300 mg daily, metoprolol succinate 25 mg daily, clonidine 0.1 mg PRN. BMP reviewed from May 2023.

## 2022-04-26 NOTE — Progress Notes (Signed)
Subjective:    Patient ID: Morgan Bowen, female    DOB: 12-12-1935, 86 y.o.   MRN: [id number]  HPI  Morgan Bowen is a very pleasant 86 y.o. female with a history of hypertension, type 2 diabetes, DVT, osteoporosis, nontraumatic compression fracture of lumbar spine, anxiety depression, decreased renal function, palpitations who presents today for follow-up of chronic conditions.  1) Essential hypertension: Currently managed on amlodipine 10 mg daily, [place] 300 mg daily, metoprolol succinate 25 mg daily, clonidine 0.1 mg daily as needed. No recent use of Clonidine.   She denies dizziness, chest pain, palpitations.   BP Readings from Last 3 Encounters:  04/26/22 124/68  04/21/22 (!) 157/79  01/15/22 (!) 154/88   2) Type 2 Diabetes:  Current medications include: Glimepiride 2 mg 2 times daily, metformin 1000 mg BID.   She is checking her blood glucose 1 times daily and is getting readings of   Last A1C: 7.4 in March 2023, repeat A1c pending today. Last Eye Exam: Up-to-date Last Foot Exam: Due Pneumonia Vaccination: 2019 Urine Microalbumin: None.  Managed on ARB Statin: Not managed on statin therapy  Dietary changes since last visit: None.    Exercise: Active at home. No regular exercise.   3) Anxiety and Depression: Currently not managed on treatment, previously managed on Zoloft. Symptoms have waxed and waned over the years. Overall she feels that she manages well on her own.   4) Osteoporosis: Currently managed on vitamin D 50,000 IU weekly. History of nontraumatic lumbar compression fracture. Last bone density scan was in 2020. It has been recommended on numerous occasions for her to try bisphosphonate treatment or Prolia, she has declined. She is compliant to vitamin D 50,000 IU weekly and calcium daily. She is not walking much.   5) Osteoarthritis: Chronic to bilateral feet. Following with podiatry, last visit was in late August 2023, was told that her pain was  secondary to arthritis. She continues to struggle with pain to her feet which is mostly located to her dorsal feet. She's been taking Ibuprofen several times weekly with some improvement. Tylenol is not effective. She denies numbness or pain to plantar feet.   Her chronic foot pain prevents her from walking or exercising.  Review of Systems  Respiratory:  Negative for shortness of breath.   Cardiovascular:  Negative for chest pain and palpitations.  Musculoskeletal:  Positive for arthralgias.  Neurological:  Negative for dizziness, numbness and headaches.  Psychiatric/Behavioral:  The patient is not nervous/anxious.          Past Medical History:  Diagnosis Date   Acute cystitis 05/23/2018   Acute deep vein thrombosis (DVT) of right tibial vein ([place]) 04/03/2020   Anxiety    Arthritis    Bronchitis 06/17/2015   Cellulitis 12/13/2018   Chickenpox    Diabetes mellitus without complication ([facility])    GERD (gastroesophageal reflux disease)    Hypertension    Osteoporosis    Type 2 diabetes mellitus ([place])    Urinary tract infection     Social History   Socioeconomic History   Marital status: Widowed    Spouse name: Not on file   Number of children: Not on file   Years of education: Not on file   Highest education level: Not on file  Occupational History   Not on file  Tobacco Use   Smoking status: Never   Smokeless tobacco: Never  Vaping Use   Vaping Use: Never used  Substance and Sexual  Activity   Alcohol use: No   Drug use: No   Sexual activity: Not Currently  Other Topics Concern   Not on file  Social History Narrative   ** Merged History Encounter **       Widowed. Moved from [place]. 1 son, 3 grandchildren. Retired. Once worked as a Agricultural engineer.    Social Determinants of Health   Financial Resource Strain: Low Risk  (04/13/2022)   Overall Financial Resource Strain (CARDIA)    Difficulty of Paying Living Expenses: Not hard at all  Food Insecurity: No Food  Insecurity (04/13/2022)   Hunger Vital Sign    Worried About Running Out of Food in the Last Year: Never true    Ran Out of Food in the Last Year: Never true  Transportation Needs: No Transportation Needs (04/13/2022)   PRAPARE - Hydrologist (Medical): No    Lack of Transportation (Non-Medical): No  Physical Activity: Insufficiently Active (04/13/2022)   Exercise Vital Sign    Days of Exercise per Week: 2 days    Minutes of Exercise per Session: 20 min  Stress: No Stress Concern Present (04/13/2022)   [place]    Feeling of Stress : Not at all  Social Connections: Unknown (04/13/2022)   Social Connection and Isolation Panel [NHANES]    Frequency of Communication with Friends and Family: More than three times a week    Frequency of Social Gatherings with Friends and Family: More than three times a week    Attends Religious Services: Never    Marine scientist or Organizations: No    Attends Archivist Meetings: Never    Marital Status: Not on file  Intimate Partner Violence: Not At Risk (04/13/2022)   Humiliation, Afraid, Rape, and Kick questionnaire    Fear of Current or Ex-Partner: No    Emotionally Abused: No    Physically Abused: No    Sexually Abused: No    Past Surgical History:  Procedure Laterality Date   ABDOMINAL HYSTERECTOMY  [place]  07/13/2016   TONSILLECTOMY  1958   TONSILLECTOMY     WRIST FRACTURE SURGERY Right    2000    Family History  Problem Relation Age of Onset   Hypertension Mother    Stroke Mother    Stroke Father    Hypertension Father    Hypertension Sister    Kidney disease Sister    Breast cancer Neg Hx     Allergies  Allergen Reactions   Sulfamethoxazole-Trimethoprim     Other reaction(s): Not available   Penicillins  Rash    Current Outpatient Medications on File Prior to Visit  Medication Sig Dispense Refill   amLODipine (NORVASC) 10 MG tablet TAKE ONE TABLET BY MOUTH ONCE DAILY FOR BLOOD PRESSURE. Office visit required for further refills. 90 tablet 0   Calcium-Magnesium-Vitamin D [phone] MG-MG-UNIT TABS Take 1 tablet by mouth daily.     cloNIDine (CATAPRES) 0.1 MG tablet Take 1 tablet by mouth once daily as needed for systolic blood pressure greater than 170. 30 tablet 0   glimepiride (AMARYL) 2 MG tablet Take 1 tablet (2 mg total) by mouth 2 (two) times daily with a meal. for diabetes. 180 tablet 1   glucose blood (ONE TOUCH ULTRA TEST) test  strip USE AS DIRECTED TEST 3 TIMES DAILY E11.9 100 each 2   irbesartan (AVAPRO) 300 MG tablet Take 1 tablet (300 mg total) by mouth daily. for blood pressure. Office visit required for further refills. 90 tablet 0   metFORMIN (GLUCOPHAGE) 1000 MG tablet TAKE ONE TABLET BY MOUTH TWICE DAILY WITH A MEAL FOR DIABETES 180 tablet 1   metoprolol succinate (TOPROL-XL) 25 MG 24 hr tablet TAKE ONE TABLET BY MOUTH ONCE DAILY FOR blood pressure. Office visit required for further refills. 90 tablet 0   Vitamin D, Ergocalciferol, (DRISDOL) 1.25 MG (50000 UNIT) CAPS capsule TAKE ONE CAPSULE BY MOUTH ONCE A WEEK. Office visit required for further refills. 12 capsule 0   No current facility-administered medications on file prior to visit.    BP 124/68   Pulse 73   Temp (!) 97.2 F (36.2 C) (Temporal)   Ht '5\' 5"'$  (1.651 m)   Wt 123 lb (55.8 kg)   SpO2 99%   BMI 20.47 kg/m  Objective:   Physical Exam Cardiovascular:     Rate and Rhythm: Normal rate and regular rhythm.  Pulmonary:     Effort: Pulmonary effort is normal.     Breath sounds: Normal breath sounds.  Abdominal:     General: Bowel sounds are normal.     Palpations: Abdomen is soft.     Tenderness: There is no abdominal tenderness.  Musculoskeletal:     Cervical back: Neck supple.  Skin:    General: Skin  is warm and dry.  Neurological:     Mental Status: She is alert and oriented to person, place, and time.  Psychiatric:        Mood and Affect: Mood normal.           Assessment & Plan:   Problem List Items Addressed This Visit       Cardiovascular and Mediastinum   Essential hypertension    Controlled.  Continue amlodipine 10 mg daily, irbesartan 300 mg daily, metoprolol succinate 25 mg daily, clonidine 0.1 mg PRN. BMP reviewed from May 2023.        Endocrine   Type 2 diabetes mellitus with other specified complication ([place]) - Primary    Repeat A1C pending.  Poor diet, no regular exercise. Discussed to work on both. Continue metformin 1000 mg BID, glimepiride 2 mg BID.  Consider addition of GLP 1 antagonist vs DDP 4 inhibitor.  Managed on ARB.  Pneumonia vaccine UTD. Follows with podiatry.       Relevant Orders   Lipid panel   Hemoglobin A1c     Musculoskeletal and Integument   Osteoporosis    Overdue for bone density scan, she agrees to have done. Orders placed.  She continues to decline Rx treatment for osteoporosis.  Continue vitamin D 50,000 IU weekly, daily calcium.  Encouraged daily walking.      Relevant Orders   DG Bone Density     Other   Anxiety and depression    No concerns today. Continue to monitor. Continue off treatment.      Decreased renal function    Reviewed renal function from May 2023    Discouraged use of Ibuprofen.       Vitamin D deficiency    Continue vitamin D 50,000 IU capsules weekly. Repeat vitamin D level pending.  Orders for bone density scan placed, discussed to schedule.      Relevant Orders   VITAMIN D 25 Hydroxy (Vit-D Deficiency, Fractures)   Sinus tachycardia  Controlled.  Continue metoprolol succinate 25 mg daily.      Chronic foot pain    Reviewed office notes from podiatry from September 2023. Checking uric acid level today.  Recommended she discontinue iburpofen given renal  function. Since Tylenol is ineffective we will trial low dose gabapentin 100 mg HS. Drowsiness precautions provided. She will update in 1-2 weeks.       Relevant Medications   gabapentin (NEURONTIN) 100 MG capsule   Other Relevant Orders   Uric acid   Other Visit Diagnoses     Postmenopausal estrogen deficiency       Relevant Orders   DG Bone Density          [name], NP

## 2022-04-26 NOTE — Assessment & Plan Note (Signed)
No concerns today. Continue to monitor. Continue off treatment.

## 2022-04-26 NOTE — Assessment & Plan Note (Signed)
Continue vitamin D 50,000 IU capsules weekly. Repeat vitamin D level pending.  Orders for bone density scan placed, discussed to schedule.

## 2022-04-26 NOTE — Assessment & Plan Note (Addendum)
Reviewed office notes from podiatry from September 2023. Checking uric acid level today.  Recommended she discontinue iburpofen given renal function. Since Tylenol is ineffective we will trial low dose gabapentin 100 mg HS. Drowsiness precautions provided. She will update in 1-2 weeks.

## 2022-05-28 ENCOUNTER — Telehealth: Payer: Self-pay

## 2022-05-28 NOTE — Progress Notes (Signed)
    Chronic Care Management Pharmacy Assistant   Name: Morgan Bowen  MRN: [id number] DOB: [birth year]  Reason for Encounter: CCM (Appointment Reminder)  Medications: Outpatient Encounter Medications as of 05/28/2022  Medication Sig   amLODipine (NORVASC) 10 MG tablet TAKE ONE TABLET BY MOUTH ONCE DAILY FOR BLOOD PRESSURE. Office visit required for further refills.   Calcium-Magnesium-Vitamin D [phone] MG-MG-UNIT TABS Take 1 tablet by mouth daily.   cloNIDine (CATAPRES) 0.1 MG tablet Take 1 tablet by mouth once daily as needed for systolic blood pressure greater than 170.   gabapentin (NEURONTIN) 100 MG capsule Take 1 capsule (100 mg total) by mouth at bedtime. For foot pain.   glimepiride (AMARYL) 2 MG tablet Take 1 tablet (2 mg total) by mouth 2 (two) times daily with a meal. for diabetes.   glucose blood (ONE TOUCH ULTRA TEST) test strip USE AS DIRECTED TEST 3 TIMES DAILY E11.9   irbesartan (AVAPRO) 300 MG tablet Take 1 tablet (300 mg total) by mouth daily. for blood pressure. Office visit required for further refills.   metFORMIN (GLUCOPHAGE) 1000 MG tablet TAKE ONE TABLET BY MOUTH TWICE DAILY WITH A MEAL FOR DIABETES   metoprolol succinate (TOPROL-XL) 25 MG 24 hr tablet TAKE ONE TABLET BY MOUTH ONCE DAILY FOR blood pressure. Office visit required for further refills.   Vitamin D, Ergocalciferol, (DRISDOL) 1.25 MG (50000 UNIT) CAPS capsule TAKE ONE CAPSULE BY MOUTH ONCE A WEEK. Office visit required for further refills.   No facility-administered encounter medications on file as of 05/28/2022.   [name] was contacted to remind of upcoming telephone visit with [name] on 06/02/2022 at 3:45. Patient was reminded to have any blood glucose and blood pressure readings available for review at appointment.   Message was left reminding patient of appointment.  CCM referral has been placed prior to visit?  No   Star Rating Drugs: Medication:  Last Fill: Day  Supply Glimepiride 2 mg 03/24/22 90 Irbesartan 300 mg 03/24/22 90 Metformin 1000 mg 03/24/22 [place], CPP notified  [name], [facility] Pharmacy Assistant [phone]

## 2022-06-02 ENCOUNTER — Ambulatory Visit: Payer: Medicare Other | Admitting: Pharmacist

## 2022-06-02 DIAGNOSIS — E1169 Type 2 diabetes mellitus with other specified complication: Secondary | ICD-10-CM

## 2022-06-02 DIAGNOSIS — N1831 Chronic kidney disease, stage 3a: Secondary | ICD-10-CM

## 2022-06-02 DIAGNOSIS — E785 Hyperlipidemia, unspecified: Secondary | ICD-10-CM

## 2022-06-02 DIAGNOSIS — M810 Age-related osteoporosis without current pathological fracture: Secondary | ICD-10-CM

## 2022-06-02 DIAGNOSIS — I10 Essential (primary) hypertension: Secondary | ICD-10-CM

## 2022-06-02 NOTE — Progress Notes (Signed)
Chronic Care Management Pharmacy Note  06/02/2022 Name:  Morgan Bowen MRN:  [id number] DOB:  02-17-1936  Summary: CCM F/U visit -Pt is concerned about drug cost - it is up to nearly $200 per 90 day supply at her current pharmacy; reviewed insurance plan and preferred pharmacies, [facility] is the preferred pharmacy with her plan, she would save money by switching - she will think about this and let pharmacy/PCP know if she wants to switch -Pt reports some improvement in foot pain since starting gabapentin  Recommendations/Changes made from today's visit: -No med changes; emailed patient Medicare.gov medication cost breakdowns for different pharmacies ([facility], [facility], [facility])  Plan: -[place] will call patient 1 month for pharmacy update -Pharmacist follow up televisit 3 months -PCP 07/27/22    Subjective: Morgan Bowen is an 86 y.o. year old female who is a primary patient of [name], NP.  The CCM team was consulted for assistance with disease management and care coordination needs.    Engaged with patient by telephone for follow up visit in response to provider referral for pharmacy case management and/or care coordination services.   Consent to Services:  The patient was given information about Chronic Care Management services, agreed to services, and gave verbal consent prior to initiation of services.  Please see initial visit note for detailed documentation.   Patient Care Team: [name], NP as PCP - General (Internal Medicine) [name], NP (Internal Medicine) Charlton Haws, [facility] as Pharmacist (Pharmacist)  Recent office visits: 04/26/22 NP Allie Bossier OV: f/u - start gabapentin 100 mg for foot pain. Schedule DEXA scan. D/C valsartan, olmesartan, lorazepam, ibandronate - pt preference. A1c 8.1% - opted to work on diet. F/u 3 months.  01/15/22 NP Allie Bossier OV: dizziness, open wound. Rx'd Keflex for skin wound. BMP  stable.  10/13/21 NP Allie Bossier OV: DM - A1c 7.4%, no changes.  Recent consult visits: 04/21/22 Urgent Care: [place] - : Rx keflex. Ucx - Klebsiella and proteus.  10/24/21 Urgent Care: UTI - Rx Keflex. Ucx - Proteus  Hospital visits: 01/11/22 ED visit [facility]): dizziness - dehydration in s/o diarrhea. Received IVF. F/u PCP.   Objective:  Lab Results  Component Value Date   CREATININE 1.13 01/15/2022   BUN 21 01/15/2022   GFR 44.86 (L) 01/15/2022   GFRNONAA 39 (L) 01/11/2022   GFRAA >60 04/13/2020   NA 136 01/15/2022   K 4.8 01/15/2022   CALCIUM 9.9 01/15/2022   CO2 25 01/15/2022   GLUCOSE 183 (H) 01/15/2022    Lab Results  Component Value Date/Time   HGBA1C 8.1 (H) 04/26/2022 11:47 AM   HGBA1C 7.4 (A) 10/13/2021 09:29 AM   HGBA1C 7.1 (A) 07/27/2021 10:52 AM   HGBA1C 8.2 (H) 04/15/2021 08:19 AM   GFR 44.86 (L) 01/15/2022 12:31 PM   GFR 53.50 (L) 04/15/2021 08:19 AM    Last diabetic Eye exam:  Lab Results  Component Value Date/Time   HMDIABEYEEXA No Retinopathy 11/18/2020 12:00 AM    Last diabetic Foot exam: No results found for: "HMDIABFOOTEX"   Lab Results  Component Value Date   CHOL 198 04/26/2022   HDL 49.00 04/26/2022   LDLCALC 121 (H) 04/26/2022   TRIG 142.0 04/26/2022   CHOLHDL 4 04/26/2022       Latest Ref Rng & Units 04/15/2021    8:19 AM 03/31/2020    8:24 AM 06/02/2019   12:59 AM  Hepatic Function  Total Protein 6.0 - 8.3  g/dL 7.9  7.7  8.7   Albumin 3.5 - 5.2 g/dL 4.2  4.2  4.4   AST 0 - 37 U/L _0 ALT 0 - 35 U/L _1 Alk Phosphatase 39 - 117 U/L 72  64  81   Total Bilirubin 0.2 - 1.2 mg/dL 0.6  0.6  0.5     Lab Results  Component Value Date/Time   TSH 3.40 04/21/2020 12:08 PM       Latest Ref Rng & Units 01/11/2022    6:33 PM 04/18/2021    5:54 PM 04/13/2020    9:37 PM  CBC  WBC 4.0 - 10.5 K/uL 10.9  9.3  8.8   Hemoglobin 12.0 - 15.0 g/dL 12.6  12.1  12.0   Hematocrit 36.0 - 46.0 % 38.3  33.5  34.9   Platelets 150 - 400 K/uL  295  262  255     Lab Results  Component Value Date/Time   VD25OH 63.15 04/26/2022 11:47 AM   VD25OH 78.93 04/15/2021 08:19 AM    Clinical ASCVD: No  The ASCVD Risk score (Arnett DK, et al., 2019) failed to calculate for the following reasons:   The 2019 ASCVD risk score is only valid for ages 41 to 7       04/13/2022    9:38 AM 10/13/2021    9:25 AM 04/14/2021    9:08 AM  Depression screen PHQ 2/9  Decreased Interest 0 0 3  Down, Depressed, Hopeless 0 0 0  PHQ - 2 Score 0 0 3  Altered sleeping 0 0 2  Tired, decreased energy 0 0 0  Change in appetite 0 0 0  Feeling bad or failure about yourself  0 0 0  Trouble concentrating 0 0 0  Moving slowly or fidgety/restless 0 0 0  Suicidal thoughts 0 0 0  PHQ-9 Score 0 0 5  Difficult doing work/chores Not difficult at all Not difficult at all Somewhat difficult    Social History   Tobacco Use  Smoking Status Never  Smokeless Tobacco Never   BP Readings from Last 3 Encounters:  04/26/22 124/68  04/21/22 (!) 157/79  01/15/22 (!) 154/88   Pulse Readings from Last 3 Encounters:  04/26/22 73  04/21/22 87  01/15/22 89   Wt Readings from Last 3 Encounters:  04/26/22 123 lb (55.8 kg)  04/13/22 123 lb (55.8 kg)  01/15/22 123 lb (55.8 kg)   BMI Readings from Last 3 Encounters:  04/26/22 20.47 kg/m  04/13/22 20.47 kg/m  01/15/22 20.47 kg/m    Assessment/Interventions: Review of patient past medical history, allergies, medications, health status, including review of consultants reports, laboratory and other test data, was performed as part of comprehensive evaluation and provision of chronic care management services.   SDOH:  (Social Determinants of Health) assessments and interventions performed: No SDOH Interventions    Flowsheet Row Clinical Support from 04/13/2022 in [place] at [place] Management from 10/24/2020 in [place] at [place] from 04/01/2020 in [place] at [place] Management from 01/30/2020 in [place] at [place] Interventions Intervention Not Indicated -- -- --  Housing Interventions Intervention Not Indicated -- -- --  Transportation Interventions Intervention Not Indicated -- -- --  Utilities Interventions Intervention Not Indicated -- -- --  Alcohol Usage Interventions Intervention Not Indicated (Score <7) -- -- --  Depression Interventions/Treatment  -- -- EHM0-9 Score <4 Follow-up Not Indicated --  Financial Strain Interventions Intervention Not Indicated Intervention Not Indicated -- Financial Counselor  [Referred to [facility]]  Physical Activity Interventions Intervention Not Indicated -- -- --  Stress Interventions Intervention Not Indicated -- -- --  Social Connections Interventions Intervention Not Indicated -- -- --      SDOH Screenings   Food Insecurity: No Food Insecurity (04/13/2022)  Housing: Low Risk  (04/13/2022)  Transportation Needs: No Transportation Needs (04/13/2022)  Utilities: Not At Risk (04/13/2022)  Alcohol Screen: Low Risk  (04/13/2022)  Depression (PHQ2-9): Low Risk  (04/13/2022)  Financial Resource Strain: Low Risk  (04/13/2022)  Physical Activity: Insufficiently Active (04/13/2022)  Social Connections: Unknown (04/13/2022)  Stress: No Stress Concern Present (04/13/2022)  Tobacco Use: Low Risk  (04/26/2022)    [place]  Allergies  Allergen Reactions   Sulfamethoxazole-Trimethoprim     Other reaction(s): Not available   Penicillins Rash    Medications Reviewed Today     Reviewed by Charlton Haws, [facility] (Pharmacist) on 06/02/22 at 1647  Med List Status: <None>   Medication Order Taking? Sig Documenting Provider Last Dose Status Informant  amLODipine (NORVASC) 10 MG tablet [id number] Yes TAKE ONE TABLET BY MOUTH ONCE DAILY FOR BLOOD PRESSURE. Office visit required for further refills. [name], NP Taking Active    Calcium-Magnesium-Vitamin D [phone] MG-MG-UNIT TABS [id number] Yes Take 1 tablet by mouth daily. [provider] Taking Active   cloNIDine (CATAPRES) 0.1 MG tablet [id number] Yes Take 1 tablet by mouth once daily as needed for systolic blood pressure greater than 170. [name], NP Taking Active   gabapentin (NEURONTIN) 100 MG capsule [id number] Yes Take 1 capsule (100 mg total) by mouth at bedtime. For foot pain. [name], NP Taking Active   glimepiride (AMARYL) 2 MG tablet [id number] Yes Take 1 tablet (2 mg total) by mouth 2 (two) times daily with a meal. for diabetes. [name], NP Taking Active   glucose blood (ONE TOUCH ULTRA TEST) test strip [id number] Yes USE AS DIRECTED TEST 3 TIMES DAILY E11.9 [name], NP Taking Active   irbesartan (AVAPRO) 300 MG tablet [id number] Yes Take 1 tablet (300 mg total) by mouth daily. for blood pressure. Office visit required for further refills. [name], NP Taking Active   metFORMIN (GLUCOPHAGE) 1000 MG tablet [id number] Yes TAKE ONE TABLET BY MOUTH TWICE DAILY WITH A MEAL FOR DIABETES [name], NP Taking Active   metoprolol succinate (TOPROL-XL) 25 MG 24 hr tablet [id number] Yes TAKE ONE TABLET BY MOUTH ONCE DAILY FOR blood pressure. Office visit required for further refills. [name], NP Taking Active   Vitamin D, Ergocalciferol, (DRISDOL) 1.25 MG (50000 UNIT) CAPS capsule [id number] Yes TAKE ONE CAPSULE BY MOUTH ONCE A WEEK. Office visit required for further refills. 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/18/2020   On apixaban therapy 04/21/2020   Palpitations 04/21/2020  Pain due to onychomycosis of toenails of both feet 01/22/2019   Vitamin D deficiency 06/26/2018   Decreased renal function 03/21/2018   Non-traumatic compression fracture of lumbar vertebra with routine healing 08/26/2017   Anxiety and depression 08/26/2017   Type 2 diabetes mellitus with other specified complication ([place]) 89/37/3428   Essential hypertension 04/27/2017   Osteoporosis 04/27/2017    Immunization History  Administered Date(s) Administered   Influenza, High Dose Seasonal PF 05/09/2012   Influenza,inj,Quad PF,6+ Mos 03/30/2019   PFIZER(Purple Top)SARS-COV-2 Vaccination 04/08/2020, 04/29/2020   Pneumococcal Conjugate-13 10/24/2017   Pneumococcal Polysaccharide-23 08/23/2013   Tdap 07/14/2015   Zoster, Live 08/06/2013, 12/08/2014    Conditions to be addressed/monitored:  Hypertension, Hyperlipidemia, Diabetes, Chronic Kidney Disease, and Osteoporosis  Care Plan : [place]  Updates made by Charlton Haws, [place] since 06/07/2022 12:00 AM     Problem: Hypertension, Hyperlipidemia, Diabetes, Chronic Kidney Disease, and Osteoporosis      Long-Range Goal: Disease Management   Start Date: 10/24/2020  Recent Progress: On track  Priority: High  Note:   Current Barriers:  Unable to maintain control of DM Increasing pharmacy costs  Pharmacist Clinical Goal(s):  Patient will adhere to plan to optimize therapeutic regimen for DM as evidenced by report of adherence to recommended medication management changes contact provider office for questions/concerns as evidenced notation of same in electronic health record through collaboration with PharmD and provider.   Interventions: 1:1 collaboration with [name], NP regarding development and update of comprehensive plan of care as evidenced by provider attestation and  co-signature Inter-disciplinary care team collaboration (see longitudinal plan of care) Comprehensive medication review performed; medication list updated in electronic medical record  Hypertension (BP goal <140/90) -Controlled - BP at goal in recent OV -Current home readings: none available -Current treatment: Irbesartan 300 mg daily - Appropriate, Effective, Safe, Accessible Metoprolol succinate 25 mg daily -Appropriate, Effective, Safe, Accessible Amlodipine 10 mg daily -Appropriate, Effective, Safe, Accessible Clonidine 0.1 mg PRN SBP > 170 mmHg (patient has never taken)  -Medications previously tried: none   -Current exercise habits: walking daily  -Denies hypotensive/hypertensive symptoms; She had her BP monitor calibrated recently. -Counseled to monitor BP at home weekly, document, and provide log at future appointments -Recommended to continue current medication; Continue to keep home BP logs.  Diabetes (A1c goal <8%) Not ideally controlled - A1c 8.1% (04/2022) - pt opted to work on diet instead of changing medications -Current home glucose readings: Denies hypoglycemic/hyperglycemic symptoms -Current medications: Glimepiride 2 mg - 2 tab at lunch -Appropriate, Query Effective Metformin 1000 mg BID -Appropriate, Query Effective -Medications previously tried: none   -Reviewed hx of multiple UTIs - would avoid SGLT2-inhibitor -Recommended to continue current medication -Recommend foot exam and Urine ACR at next OV  Hyperlipidemia: (LDL goal < 100) -Not ideally controlled - LDL 121 (04/2022); pt has declined statin therapy despite recommendations with DM -Current treatment: None -Medications previously tried: none  -Counseled on diet and exercise extensively  Osteoporosis (Goal prevent fractures) -Not ideally controlled - pt has declined bisphosphonate and Prolia on multiple occasions despite recommendations from PCP -Hx compression fracture of lumbar vertabra -Last DEXA  Scan: 07/2019   T-Score femoral neck: -2.7  T-Score total hip: -2.2  T-Score lumbar spine: n/a  T-Score forearm radius: -3.3 -Patient is a candidate for pharmacologic treatment due to T-Score < -2.5 in femoral neck -Current treatment  Vitamin D 50,000 IU weekly - Appropriate, Effective, Safe, Accessible -Medications previously tried: n/a  -  Recommend weight-bearing and muscle strengthening exercises for building and maintaining bone density.  Foot pain (Goal: manage pain) -Improved - pt reports some improvement since starting gabapentin; she denies excess fatigue -Current treatment  Gabapentin 100 mg daily - Appropriate, Effective, Safe, Accessible -Medications previously tried: n/a  -Recommended to continue current medication  Chronic Kidney Disease Stage 3a  -All medications assessed for renal dosing and appropriateness in chronic kidney disease. -Recommended to continue current medication  Health Maintenance -Vaccine gaps: None  Patient Goals/Self-Care Activities Patient will:  - take medications as prescribed as evidenced by patient report and record review focus on medication adherence by routine check glucose daily, document, and provide at future appointments collaborate with provider on medication access solutions       Medication Assistance: None required.  Patient affirms current coverage meets needs.  Compliance/Adherence/Medication fill history: Care Gaps: Foot exam (due 01/13/22) UACR (never done)  Star-Rating Drugs: Glimepiride - PDC 100% Irbesartan - PDC 100% Metformin - PDC 100%  Medication Access: Within the past 30 days, how often has patient missed a dose of medication? 0 Is a pillbox or other method used to improve adherence? Yes  Factors that may affect medication adherence? no barriers identified Are meds synced by current pharmacy? Yes  Are meds delivered by current pharmacy? Yes  Does patient experience delays in picking up medications due to  transportation concerns? No   [facility] Reviewed: Is patient disadvantaged to use [facility]?: Yes  - discussed [facility] preferred [facility] with her plan Current Rx insurance plan: [facility] Name and location of Current pharmacy:  [facility] - [place], [place] - [address] [place] [address] Phone: [phone] Fax: [phone]  [id number]: [phone]: [phone] [facility] services reviewed with patient today?: Yes  90-day VIALS (delivered 03/29/22): Amlodipine 125m  1 tablet daily (breakfast) Metoprolol 2514m1 tablet daily (bedtime) Irbesartan 300m18m tablet daily (bedtime) Glimepiride 2mg 62m tablets daily (2- lunch) Metformin 1000mg 45mblet twice daily (1 breakfast, 1 evening meal) Vitamin D 12.50 mcg (50,000 units) - 1 tablet every Friday (breakfast)   Care Plan and Follow Up Patient Decision:  Patient agrees to Care Plan and Follow-up.  Plan: Telephone follow up appointment with care management team member scheduled for:  3 months  LindseCharlene BrookemD, BCACP Clinical Pharmacist [phone]

## 2022-06-07 NOTE — Patient Instructions (Addendum)
Visit Information  Phone number for Pharmacist: [phone]   Goals Addressed   None     Care Plan : [place]  Updates made by Charlton Haws, [place] since 06/07/2022 12:00 AM     Problem: Hypertension, Hyperlipidemia, Diabetes, Chronic Kidney Disease, and Osteoporosis      Long-Range Goal: Disease Management   Start Date: 10/24/2020  Recent Progress: On track  Priority: High  Note:   Current Barriers:  Unable to maintain control of DM Increasing pharmacy costs  Pharmacist Clinical Goal(s):  Patient will adhere to plan to optimize therapeutic regimen for DM as evidenced by report of adherence to recommended medication management changes contact provider office for questions/concerns as evidenced notation of same in electronic health record through collaboration with PharmD and provider.   Interventions: 1:1 collaboration with [name], NP regarding development and update of comprehensive plan of care as evidenced by provider attestation and co-signature Inter-disciplinary care team collaboration (see longitudinal plan of care) Comprehensive medication review performed; medication list updated in electronic medical record  Hypertension (BP goal <140/90) -Controlled - BP at goal in recent OV -Current home readings: none available -Current treatment: Irbesartan 300 mg daily - Appropriate, Effective, Safe, Accessible Metoprolol succinate 25 mg daily -Appropriate, Effective, Safe, Accessible Amlodipine 10 mg daily -Appropriate, Effective, Safe, Accessible Clonidine 0.1 mg PRN SBP > 170 mmHg (patient has never taken)  -Medications previously tried: none   -Current exercise habits: walking daily  -Denies hypotensive/hypertensive symptoms; She had her BP monitor calibrated recently. -Counseled to monitor BP at home weekly, document, and provide log at future appointments -Recommended to continue current medication; Continue to keep home BP  logs.  Diabetes (A1c goal <8%) Not ideally controlled - A1c 8.1% (04/2022) - pt opted to work on diet instead of changing medications -Current home glucose readings: Denies hypoglycemic/hyperglycemic symptoms -Current medications: Glimepiride 2 mg - 2 tab at lunch -Appropriate, Query Effective Metformin 1000 mg BID -Appropriate, Query Effective -Medications previously tried: none   -Reviewed hx of multiple UTIs - would avoid SGLT2-inhibitor -Recommended to continue current medication -Recommend foot exam and Urine ACR at next OV  Hyperlipidemia: (LDL goal < 100) -Not ideally controlled - LDL 121 (04/2022); pt has declined statin therapy despite recommendations with DM -Current treatment: None -Medications previously tried: none  -Counseled on diet and exercise extensively  Osteoporosis (Goal prevent fractures) -Not ideally controlled - pt has declined bisphosphonate and Prolia on multiple occasions despite recommendations from PCP -Hx compression fracture of lumbar vertabra -Last DEXA Scan: 07/2019   T-Score femoral neck: -2.7  T-Score total hip: -2.2  T-Score lumbar spine: n/a  T-Score forearm radius: -3.3 -Patient is a candidate for pharmacologic treatment due to T-Score < -2.5 in femoral neck -Current treatment  Vitamin D 50,000 IU weekly - Appropriate, Effective, Safe, Accessible -Medications previously tried: n/a  -Recommend weight-bearing and muscle strengthening exercises for building and maintaining bone density.  Foot pain (Goal: manage pain) -Improved - pt reports some improvement since starting gabapentin; she denies excess fatigue -Current treatment  Gabapentin 100 mg daily - Appropriate, Effective, Safe, Accessible -Medications previously tried: n/a  -Recommended to continue current medication  Chronic Kidney Disease Stage 3a  -All medications assessed for renal dosing and appropriateness in chronic kidney disease. -Recommended to continue current  medication  Health Maintenance -Vaccine gaps: None  Patient Goals/Self-Care Activities Patient will:  - take medications as prescribed as evidenced by patient report and record review focus on medication adherence by routine  check glucose daily, document, and provide at future appointments collaborate with provider on medication access solutions     Patient verbalizes understanding of instructions and care plan provided today and agrees to view in [place]. Active MyChart status and patient understanding of how to access instructions and care plan via MyChart confirmed with patient.    Telephone follow up appointment with pharmacy team member scheduled for: 3 months  [name], PharmD, [facility] Clinical Pharmacist [place] [facility] at [facility] [phone]

## 2022-06-14 ENCOUNTER — Ambulatory Visit
Admission: RE | Admit: 2022-06-14 | Discharge: 2022-06-14 | Disposition: A | Discharge status: Home / Self Care | Payer: Medicare Other | Source: Ambulatory Visit | Attending: Primary Care | Admitting: Primary Care

## 2022-06-14 DIAGNOSIS — M810 Age-related osteoporosis without current pathological fracture: Secondary | ICD-10-CM | POA: Diagnosis not present

## 2022-06-14 DIAGNOSIS — Z780 Asymptomatic menopausal state: Secondary | ICD-10-CM | POA: Diagnosis not present

## 2022-06-16 ENCOUNTER — Telehealth: Payer: Self-pay

## 2022-06-16 NOTE — Progress Notes (Signed)
Chronic Care Management Pharmacy Assistant   Name: Morgan Bowen  MRN: [id number] DOB: [birth year]  Reason for Encounter: CCM (Medication Adherence and Delivery Coordination)  Recent office visits:  None since last CCM contact  Recent consult visits:  06/14/22 [facility] visits:  None since last CCM contact  Medications: Outpatient Encounter Medications as of 06/16/2022  Medication Sig   amLODipine (NORVASC) 10 MG tablet TAKE ONE TABLET BY MOUTH ONCE DAILY FOR BLOOD PRESSURE. Office visit required for further refills.   Calcium-Magnesium-Vitamin D [phone] MG-MG-UNIT TABS Take 1 tablet by mouth daily.   cloNIDine (CATAPRES) 0.1 MG tablet Take 1 tablet by mouth once daily as needed for systolic blood pressure greater than 170.   gabapentin (NEURONTIN) 100 MG capsule Take 1 capsule (100 mg total) by mouth at bedtime. For foot pain.   glimepiride (AMARYL) 2 MG tablet Take 1 tablet (2 mg total) by mouth 2 (two) times daily with a meal. for diabetes.   glucose blood (ONE TOUCH ULTRA TEST) test strip USE AS DIRECTED TEST 3 TIMES DAILY E11.9   irbesartan (AVAPRO) 300 MG tablet Take 1 tablet (300 mg total) by mouth daily. for blood pressure. Office visit required for further refills.   metFORMIN (GLUCOPHAGE) 1000 MG tablet TAKE ONE TABLET BY MOUTH TWICE DAILY WITH A MEAL FOR DIABETES   metoprolol succinate (TOPROL-XL) 25 MG 24 hr tablet TAKE ONE TABLET BY MOUTH ONCE DAILY FOR blood pressure. Office visit required for further refills.   Vitamin D, Ergocalciferol, (DRISDOL) 1.25 MG (50000 UNIT) CAPS capsule TAKE ONE CAPSULE BY MOUTH ONCE A WEEK. Office visit required for further refills.   No facility-administered encounter medications on file as of 06/16/2022.   BP Readings from Last 3 Encounters:  04/26/22 124/68  04/21/22 (!) 157/79  01/15/22 (!) 154/88    Lab Results  Component Value Date   HGBA1C 8.1 (H) 04/26/2022     No OVs, Consults, or hospital visits since last  care coordination call / Pharmacist visit. No medication changes indicated   Last adherence delivery date: 03/29/2022      Patient is due for next adherence delivery on: 06/28/2022  Multiple attempts made to reach patient. Unsuccessful outreach. Will refill based off of last adherence fill.   This delivery to include: Vials  90 Days   No safety caps  VIAL medications: Amlodipine '10mg'$ -  1 tablet daily (breakfast) Metoprolol '25mg'$ - 1 tablet daily (bedtime) Irbesartan '300mg'$ - 1 tablet daily (bedtime) Glimepiride '2mg'$  - 2 tablets daily (2- lunch) Metformin '1000mg'$  1 tablet twice daily (1 breakfast, 1 evening meal) Vitamin D 12.50 mcg (50,000 units) - 1 tablet every Friday (breakfast)   Patient declined the following medications this month: none   Any concerns about your medications? No   How often do you forget or accidentally miss a dose? Rarely   Do you use a pillbox? Yes   Is patient in packaging No  Refills requested from providers include: Not complete Amlodipine '10mg'$ -  1 tablet daily (breakfast) Metoprolol '25mg'$ - 1 tablet daily (bedtime) Irbesartan '300mg'$ - 1 tablet daily (bedtime) Vitamin D 12.50 mcg (50,000 units) - 1 tablet every Friday (breakfast)  Delivery scheduled for 06/28/2022. Unable to speak with patient to confirm date.    Annual wellness visit in last year? No Most Recent BP reading: 124/68 on 04/26/2022  If Diabetic: Most recent A1C reading: 8.1 on 04/26/2022 Last eye exam / retinopathy screening: Up to date Last diabetic foot exam: 01/13/2021  Cycle dispensing form  sent to [facility], CPP for review.  [name], CPP notified  [name], [place] Clinical Pharmacy Assistant [phone]

## 2022-06-21 ENCOUNTER — Other Ambulatory Visit: Payer: Self-pay | Admitting: Primary Care

## 2022-06-21 DIAGNOSIS — E119 Type 2 diabetes mellitus without complications: Secondary | ICD-10-CM

## 2022-06-21 DIAGNOSIS — I10 Essential (primary) hypertension: Secondary | ICD-10-CM

## 2022-06-21 DIAGNOSIS — M810 Age-related osteoporosis without current pathological fracture: Secondary | ICD-10-CM

## 2022-06-24 ENCOUNTER — Encounter: Payer: Self-pay | Admitting: Family

## 2022-06-24 ENCOUNTER — Telehealth (INDEPENDENT_AMBULATORY_CARE_PROVIDER_SITE_OTHER): Payer: Medicare Other | Admitting: Family

## 2022-06-24 VITALS — BP 155/66 | HR 85 | Temp 96.7°F | Ht 65.0 in | Wt 123.0 lb

## 2022-06-24 DIAGNOSIS — U071 COVID-19: Secondary | ICD-10-CM | POA: Diagnosis not present

## 2022-06-24 HISTORY — DX: COVID-19: U07.1

## 2022-06-24 MED ORDER — MOLNUPIRAVIR EUA 200MG CAPSULE: 4.0000 | ORAL_CAPSULE | Freq: Two times a day (BID) | ORAL | 0 refills | Status: AC

## 2022-06-24 NOTE — Assessment & Plan Note (Signed)
Pt considered high risk for hospitalization. have decided pt is a candidate for antiviral and pt agrees that she would like to take this. I have sent in RX for molnupiravir 200 mg capsules to be taken as directed. ?Advised of CDC guidelines for self isolation/ ending isolation.  Advised of safe practice guidelines. Symptom Tier reviewed.  Encouraged to monitor for any worsening symptoms; watch for increased shortness of breath, weakness, and signs of dehydration. Advised when to seek emergency care.  Instructed to rest and hydrate well.  Advised to leave the house during recommended isolation period, only if it is necessary to seek medical care ? ? ? ?

## 2022-06-24 NOTE — Progress Notes (Signed)
MyChart Video Visit    Virtual Visit via Video Note   This visit type was conducted due to national recommendations for restrictions regarding the COVID-19 Pandemic (e.g. social distancing) in an effort to limit this patient's exposure and mitigate transmission in our community. This patient is at least at moderate risk for complications without adequate follow up. This format is felt to be most appropriate for this patient at this time. Physical exam was limited by quality of the video and audio technology used for the visit. CMA was able to get the patient set up on a video visit.  Patient location: Home. Patient and provider in visit Provider location: Office  I discussed the limitations of evaluation and management by telemedicine and the availability of in person appointments. The patient expressed understanding and agreed to proceed.  Visit Date: 06/24/2022  Today's healthcare provider: Eugenia Pancoast, FNP     Subjective:    Patient ID: Morgan Bowen, female    DOB: 1935-09-02, 86 y.o.   MRN: [id number]  Chief Complaint  Patient presents with   Covid Positive    Yesterday am.    Dizziness   Emesis   Nausea    Dizziness Associated symptoms include vomiting.  Emesis  Associated symptoms include dizziness.    Pt here today via video visit with concerns.   Yesterday tested positive for covid.  This is her first time having covid.  She Is current nauseated, and has vomited which has since resolved today.  Not currently vomiting today. No sore throat, no cough. No chest congestion. No headache.  Might have had a fever four days ago.  Symptoms started three days ago  Past Medical History:  Diagnosis Date   Acute cystitis 05/23/2018   Acute deep vein thrombosis (DVT) of right tibial vein (HCC) 04/03/2020   Anxiety    Arthritis    Bronchitis 06/17/2015   Cellulitis 12/13/2018   Chickenpox    Diabetes mellitus without complication (HCC)    GERD (gastroesophageal  reflux disease)    Hypertension    Osteoporosis    Type 2 diabetes mellitus ([place])    Urinary tract infection     Past Surgical History:  Procedure Laterality Date   ABDOMINAL HYSTERECTOMY  1984   ABDOMINAL HYSTERECTOMY     APPENDECTOMY  1958   CHOLECYSTECTOMY     GALLBLADDER SURGERY     MANDIBLE FRACTURE SURGERY  07/13/2016   TONSILLECTOMY  1958   TONSILLECTOMY     WRIST FRACTURE SURGERY Right    2000    Family History  Problem Relation Age of Onset   Hypertension Mother    Stroke Mother    Stroke Father    Hypertension Father    Hypertension Sister    Kidney disease Sister    Breast cancer Neg Hx     Social History   Socioeconomic History   Marital status: Widowed    Spouse name: Not on file   Number of children: Not on file   Years of education: Not on file   Highest education level: Not on file  Occupational History   Not on file  Tobacco Use   Smoking status: Never   Smokeless tobacco: Never  Vaping Use   Vaping Use: Never used  Substance and Sexual Activity   Alcohol use: No   Drug use: No   Sexual activity: Not Currently  Other Topics Concern   Not on file  Social History Narrative   ** Merged History  Encounter **       Widowed. Moved from [place]. 1 son, 3 grandchildren. Retired. Once worked as a Agricultural engineer.    Social Determinants of Health   Financial Resource Strain: Low Risk  (04/13/2022)   Overall Financial Resource Strain (CARDIA)    Difficulty of Paying Living Expenses: Not hard at all  Food Insecurity: No Food Insecurity (04/13/2022)   Hunger Vital Sign    Worried About Running Out of Food in the Last Year: Never true    Ran Out of Food in the Last Year: Never true  Transportation Needs: No Transportation Needs (04/13/2022)   PRAPARE - Hydrologist (Medical): No    Lack of Transportation (Non-Medical): No  Physical Activity: Insufficiently Active (04/13/2022)   Exercise Vital Sign    Days of Exercise  per Week: 2 days    Minutes of Exercise per Session: 20 min  Stress: No Stress Concern Present (04/13/2022)   [place]    Feeling of Stress : Not at all  Social Connections: Unknown (04/13/2022)   Social Connection and Isolation Panel [NHANES]    Frequency of Communication with Friends and Family: More than three times a week    Frequency of Social Gatherings with Friends and Family: More than three times a week    Attends Religious Services: Never    Marine scientist or Organizations: No    Attends Archivist Meetings: Never    Marital Status: Not on file  Intimate Partner Violence: Not At Risk (04/13/2022)   Humiliation, Afraid, Rape, and Kick questionnaire    Fear of Current or Ex-Partner: No    Emotionally Abused: No    Physically Abused: No    Sexually Abused: No    Outpatient Medications Prior to Visit  Medication Sig Dispense Refill   amLODipine (NORVASC) 10 MG tablet TAKE ONE TABLET BY MOUTH ONCE DAILY FOR BLOOD PRESSURE. 90 tablet 2   ascorbic acid (VITAMIN C) 500 MG tablet Take 500 mg by mouth daily.     Calcium-Magnesium-Vitamin D [phone] MG-MG-UNIT TABS Take 1 tablet by mouth daily.     cloNIDine (CATAPRES) 0.1 MG tablet Take 1 tablet by mouth once daily as needed for systolic blood pressure greater than 170. 30 tablet 0   co-enzyme Q-10 30 MG capsule Take 30 mg by mouth 3 (three) times daily.     gabapentin (NEURONTIN) 100 MG capsule Take 1 capsule (100 mg total) by mouth at bedtime. For foot pain. 30 capsule 0   glimepiride (AMARYL) 2 MG tablet Take 1 tablet (2 mg total) by mouth 2 (two) times daily with a meal. for diabetes. 180 tablet 1   glucose blood (ONE TOUCH ULTRA TEST) test strip USE AS DIRECTED TEST 3 TIMES DAILY E11.9 100 each 2   irbesartan (AVAPRO) 300 MG tablet Take 1 tablet (300 mg total) by mouth daily. for blood pressure. 90 tablet 2   metFORMIN (GLUCOPHAGE) 1000 MG tablet  TAKE ONE TABLET BY MOUTH TWICE DAILY WITH A MEAL FOR diabetes 180 tablet 1   metoprolol succinate (TOPROL-XL) 25 MG 24 hr tablet TAKE ONE TABLET BY MOUTH ONCE DAILY FOR blood pressure. 90 tablet 2   Omega-3 Fatty Acids (FISH OIL) 300 MG CAPS Take by mouth.     Turmeric (QC TUMERIC COMPLEX) 500 MG CAPS Take by mouth.     Vitamin D, Ergocalciferol, (DRISDOL) 1.25 MG (50000 UNIT) CAPS capsule TAKE ONE  CAPSULE BY MOUTH ONCE A WEEK. 12 capsule 0   No facility-administered medications prior to visit.    Allergies  Allergen Reactions   Sulfamethoxazole-Trimethoprim     Other reaction(s): Not available   Penicillins Rash    Review of Systems  Gastrointestinal:  Positive for vomiting.  Neurological:  Positive for dizziness.       Objective:    Physical Exam Constitutional:      General: She is not in acute distress.    Appearance: Normal appearance. She is not ill-appearing or toxic-appearing.  Pulmonary:     Effort: Pulmonary effort is normal.  Neurological:     General: No focal deficit present.     Mental Status: She is alert and oriented to person, place, and time. Mental status is at baseline.  Psychiatric:        Mood and Affect: Mood normal.        Behavior: Behavior normal.        Thought Content: Thought content normal.        Judgment: Judgment normal.     BP (!) 155/66   Pulse 85   Temp (!) 96.7 F (35.9 C) (Temporal)   Ht '5\' 5"'$  (1.651 m)   Wt 123 lb (55.8 kg)   SpO2 97%   BMI 20.47 kg/m  Wt Readings from Last 3 Encounters:  06/24/22 123 lb (55.8 kg)  04/26/22 123 lb (55.8 kg)  04/13/22 123 lb (55.8 kg)       Assessment & Plan:   Problem List Items Addressed This Visit       Other   COVID-19 - Primary    Pt considered high risk for hospitalization. have decided pt is a candidate for antiviral and pt agrees that she would like to take this. I have sent in RX for molnupiravir 200 mg capsules to be taken as directed.  Advised of CDC guidelines for self  isolation/ ending isolation.  Advised of safe practice guidelines. Symptom Tier reviewed.  Encouraged to monitor for any worsening symptoms; watch for increased shortness of breath, weakness, and signs of dehydration. Advised when to seek emergency care.  Instructed to rest and hydrate well.  Advised to leave the house during recommended isolation period, only if it is necessary to seek medical care       Relevant Medications   molnupiravir EUA (LAGEVRIO) 200 mg CAPS capsule    I am having Morgan Bowen start on molnupiravir EUA. I am also having her maintain her glucose blood, Calcium-Magnesium-Vitamin D, cloNIDine, gabapentin, metFORMIN, glimepiride, metoprolol succinate, Vitamin D (Ergocalciferol), irbesartan, amLODipine, ascorbic acid, Turmeric, co-enzyme Q-10, and Fish Oil.  Meds ordered this encounter  Medications   molnupiravir EUA (LAGEVRIO) 200 mg CAPS capsule    Sig: Take 4 capsules (800 mg total) by mouth 2 (two) times daily for 5 days.    Dispense:  40 capsule    Refill:  0    Order Specific Question:   Supervising Provider    Answer:   BEDSOLE, AMY E [[id number]]    I discussed the assessment and treatment plan with the patient. The patient was provided an opportunity to ask questions and all were answered. The patient agreed with the plan and demonstrated an understanding of the instructions.   The patient was advised to call back or seek an in-person evaluation if the symptoms worsen or if the condition fails to improve as anticipated.  I provided 15 minutes of face-to-face time during this encounter.  Eugenia Pancoast, [place] at [place] [phone] (phone) [phone] (fax)  [place]

## 2022-07-15 ENCOUNTER — Ambulatory Visit: Payer: Medicare Other | Admitting: Podiatry

## 2022-07-27 ENCOUNTER — Encounter: Payer: Self-pay | Admitting: Primary Care

## 2022-07-27 ENCOUNTER — Telehealth (INDEPENDENT_AMBULATORY_CARE_PROVIDER_SITE_OTHER): Payer: Medicare Other | Admitting: Primary Care

## 2022-07-27 VITALS — Temp 97.0°F

## 2022-07-27 DIAGNOSIS — U071 COVID-19: Secondary | ICD-10-CM

## 2022-07-27 MED ORDER — ONDANSETRON 4 MG PO TBDP: 4.0000 mg | ORAL_TABLET | Freq: Three times a day (TID) | ORAL | 0 refills | Status: DC | PRN

## 2022-07-27 MED ORDER — NIRMATRELVIR/RITONAVIR (PAXLOVID) TABLET (RENAL DOSING): 2.0000 | ORAL_TABLET | Freq: Two times a day (BID) | ORAL | 0 refills | Status: AC

## 2022-07-27 MED ORDER — BENZONATATE 200 MG PO CAPS: 200.0000 mg | ORAL_CAPSULE | Freq: Three times a day (TID) | ORAL | 0 refills | Status: DC | PRN

## 2022-07-27 NOTE — Assessment & Plan Note (Signed)
Actual positive test this time.  Treat with Paxlovid renal dosing, 2 tablets BID x 5 days. Rx for Zofran ODT 4 mg sent to pharmacy to take every 8 hours PRN. Start Benzonatate capsules for cough. Take 1 capsule by mouth three times daily as needed for cough.  Strict ED/return precautions provided.   She is safe for out patient treatment.

## 2022-07-27 NOTE — Patient Instructions (Signed)
Start the Paxlovid antiviral treatment as discussed.  You can take the Zofran medication as needed for nauesa.  You may take Benzonatate capsules for cough. Take 1 capsule by mouth three times daily as needed for cough.  Go to the hospital if your symptoms become worse.  It was a pleasure to see you today!

## 2022-07-27 NOTE — Progress Notes (Signed)
Patient ID: Morgan Bowen, female    DOB: Mar 20, 1936, 86 y.o.   MRN: [id number]  Virtual visit completed through [place], a video enabled telemedicine application. Due to national recommendations of social distancing due to COVID-19, a virtual visit is felt to be most appropriate for this patient at this time. Reviewed limitations, risks, security and privacy concerns of performing a virtual visit and the availability of in person appointments. I also reviewed that there may be a patient responsible charge related to this service. The patient agreed to proceed.   Patient location: home Provider location: [place] at [facility], office Persons participating in this virtual visit: patient, provider   If any vitals were documented, they were collected by patient at home unless specified below.    Temp (!) 97 F (36.1 C) (Oral)    CC: Covid-19 infection Subjective:   HPI: Morgan Bowen is a 86 y.o. female with a history of type 2 diabetes, hypertension, sinus tachycardia, Covid-19 infection presenting on 07/27/2022 for Covid Positive (Symptoms onset 12/14/Vomiting, diarrhea, fatigue, fever, sore throat, chills)  Symptom onset on 07/22/22 with nausea and vomiting. She then developed diarrhea, fatigue, cough, sore throat, chills. She did not have Covid-19 infection in November 2023, she read the test wrong. She never took the antiviral treatment. She took a home Covid-19 yesterday which was positive.   She is interested in Paxlovid for Covid-19 treatment. She's taken Advil a few times, otherwise she's not taken anything OTC. She is also very nauseated and would like some medication to help.   She's monitoring her oxygen saturation which is running 96-98%. She denies shortness of breath. She is not running a fever.      Relevant past medical, surgical, family and social history reviewed and updated as indicated. Interim medical history since our last visit reviewed. Allergies and medications  reviewed and updated. Outpatient Medications Prior to Visit  Medication Sig Dispense Refill   amLODipine (NORVASC) 10 MG tablet TAKE ONE TABLET BY MOUTH ONCE DAILY FOR BLOOD PRESSURE. 90 tablet 2   ascorbic acid (VITAMIN C) 500 MG tablet Take 500 mg by mouth daily.     Calcium-Magnesium-Vitamin D [phone] MG-MG-UNIT TABS Take 1 tablet by mouth daily.     cloNIDine (CATAPRES) 0.1 MG tablet Take 1 tablet by mouth once daily as needed for systolic blood pressure greater than 170. 30 tablet 0   co-enzyme Q-10 30 MG capsule Take 30 mg by mouth 3 (three) times daily.     gabapentin (NEURONTIN) 100 MG capsule Take 1 capsule (100 mg total) by mouth at bedtime. For foot pain. 30 capsule 0   glimepiride (AMARYL) 2 MG tablet Take 1 tablet (2 mg total) by mouth 2 (two) times daily with a meal. for diabetes. 180 tablet 1   glucose blood (ONE TOUCH ULTRA TEST) test strip USE AS DIRECTED TEST 3 TIMES DAILY E11.9 100 each 2   irbesartan (AVAPRO) 300 MG tablet Take 1 tablet (300 mg total) by mouth daily. for blood pressure. 90 tablet 2   metFORMIN (GLUCOPHAGE) 1000 MG tablet TAKE ONE TABLET BY MOUTH TWICE DAILY WITH A MEAL FOR diabetes 180 tablet 1   metoprolol succinate (TOPROL-XL) 25 MG 24 hr tablet TAKE ONE TABLET BY MOUTH ONCE DAILY FOR blood pressure. 90 tablet 2   Omega-3 Fatty Acids (FISH OIL) 300 MG CAPS Take by mouth.     Turmeric (QC TUMERIC COMPLEX) 500 MG CAPS Take by mouth.     Vitamin D, Ergocalciferol, (  DRISDOL) 1.25 MG (50000 UNIT) CAPS capsule TAKE ONE CAPSULE BY MOUTH ONCE A WEEK. 12 capsule 0   No facility-administered medications prior to visit.     Per HPI unless specifically indicated in ROS section below Review of Systems  Constitutional:  Positive for chills, diaphoresis and fatigue. Negative for fever.  Respiratory:  Positive for cough. Negative for shortness of breath.   Gastrointestinal:  Positive for diarrhea, nausea and vomiting.   Objective:  Temp (!) 97 F (36.1 C)  (Oral)   Wt Readings from Last 3 Encounters:  06/24/22 123 lb (55.8 kg)  04/26/22 123 lb (55.8 kg)  04/13/22 123 lb (55.8 kg)       Physical exam: General: Alert and oriented x 3, no distress, does appear sickly  Pulmonary: Speaks in complete sentences without increased work of breathing, no cough during visit.  Psychiatric: Normal mood, thought content, and behavior.     Results for orders placed or performed in visit on 04/26/22  Lipid panel  Result Value Ref Range   Cholesterol 198 0 - 200 mg/dL   Triglycerides 142.0 0.0 - 149.0 mg/dL   HDL 49.00 >39.00 mg/dL   VLDL 28.4 0.0 - 40.0 mg/dL   LDL Cholesterol 121 (H) 0 - 99 mg/dL   Total CHOL/HDL Ratio 4    NonHDL 149.18   Hemoglobin A1c  Result Value Ref Range   Hgb A1c MFr Bld 8.1 (H) 4.6 - 6.5 %  Uric acid  Result Value Ref Range   Uric Acid, Serum 6.0 2.4 - 7.0 mg/dL  VITAMIN D 25 Hydroxy (Vit-D Deficiency, Fractures)  Result Value Ref Range   VITD 63.15 30.00 - 100.00 ng/mL   Assessment & Plan:   Problem List Items Addressed This Visit       Other   COVID-19 virus infection - Primary    Actual positive test this time.  Treat with Paxlovid renal dosing, 2 tablets BID x 5 days. Rx for Zofran ODT 4 mg sent to pharmacy to take every 8 hours PRN. Start Benzonatate capsules for cough. Take 1 capsule by mouth three times daily as needed for cough.  Strict ED/return precautions provided.   She is safe for out patient treatment.      Relevant Medications   benzonatate (TESSALON) 200 MG capsule   ondansetron (ZOFRAN-ODT) 4 MG disintegrating tablet   nirmatrelvir/ritonavir EUA, renal dosing, (PAXLOVID) 10 x 150 MG & 10 x '100MG'$  TABS     Meds ordered this encounter  Medications   benzonatate (TESSALON) 200 MG capsule    Sig: Take 1 capsule (200 mg total) by mouth 3 (three) times daily as needed for cough.    Dispense:  15 capsule    Refill:  0    Order Specific Question:   Supervising Provider    Answer:    BEDSOLE, AMY E [[id number]]   ondansetron (ZOFRAN-ODT) 4 MG disintegrating tablet    Sig: Take 1 tablet (4 mg total) by mouth every 8 (eight) hours as needed for nausea or vomiting.    Dispense:  15 tablet    Refill:  0    Order Specific Question:   Supervising Provider    Answer:   BEDSOLE, AMY E [[id number]]   nirmatrelvir/ritonavir EUA, renal dosing, (PAXLOVID) 10 x 150 MG & 10 x '100MG'$  TABS    Sig: Take 2 tablets by mouth 2 (two) times daily for 5 days.    Dispense:  20 tablet    Refill:  0  Order Specific Question:   Supervising Provider    Answer:   BEDSOLE, AMY E [[id number]]   No orders of the defined types were placed in this encounter.   I discussed the assessment and treatment plan with the patient. The patient was provided an opportunity to ask questions and all were answered. The patient agreed with the plan and demonstrated an understanding of the instructions. The patient was advised to call back or seek an in-person evaluation if the symptoms worsen or if the condition fails to improve as anticipated.  Follow up plan:  Start the Paxlovid antiviral treatment as discussed.  You can take the Zofran medication as needed for nauesa.  You may take Benzonatate capsules for cough. Take 1 capsule by mouth three times daily as needed for cough.  Go to the hospital if your symptoms become worse.  It was a pleasure to see you today!   [name], NP

## 2022-08-03 ENCOUNTER — Telehealth: Payer: Self-pay

## 2022-08-03 NOTE — Telephone Encounter (Signed)
I spoke with pt pt did not go to UC; pt said she is still nauseated on and off but has not vomited for several days. No fever and no diarrhea now. No abd pain. Pt has slight scanty  prod cough with white phlegm.no CP, SOB or wheezing. Pt does not want to schedule appt. Pt oxygen level is at least 95 % on room air. Pt does not want to request med for nausea now. Pt said she is going to see how she does and will cb if needed. UC & ED precautions given and pt voiced understanding.sending note to Gentry Fitz NP and [facility] pool.

## 2022-08-03 NOTE — Telephone Encounter (Signed)
Noted  

## 2022-08-03 NOTE — Telephone Encounter (Signed)
[facility] Night - Client TELEPHONE ADVICE RECORD [facility] Patient Name: Morgan Bowen Gender: Female DOB: [birth year] Age: 86 Y 60 M 19 D Return Phone Number: [phone] (Primary), [phone] (Secondary) Address: [address], [address] City/ State/ Zip: [place]  [address] Client [name] - Client Client Site [place] - Night Provider [name] - NP Contact Type Call Who Is Calling Patient / Member / Family / Caregiver Call Type Triage / Clinical Relationship To Patient Self Return Phone Number [phone] (Secondary) Chief Complaint Nausea Reason for Call Symptomatic / Request for [place] states she has COVID. She had a prescription for nausea. Her neighbor gave her a medication that helped. She has nausea. She wants the medication her neighbor gave her called in. The medication is Phenergan. Translation No Nurse Assessment Nurse: Zara Council, RN, Joelene Millin Date/Time (Eastern Time): 07/31/2022 7:09:22 PM Confirm and document reason for call. If symptomatic, describe symptoms. ---Caller states she tested positive for COVID about 9 days ago. c/o nausea, fatigue, cough. Denies fever. Had a virtual appt with her provider a couple days ago. States her neighbor gave her Phenergan for her nausea and wonders if it can be called in. States she is hydrated. Last vomiting was 3 days ago. Does the patient have any new or worsening symptoms? ---Yes Will a triage be completed? ---Yes Related visit to physician within the last 2 weeks? ---Yes Does the PT have any chronic conditions? (i.e. diabetes, asthma, this includes High risk factors for pregnancy, etc.) ---Yes List chronic conditions. ---HTN, diabetes Is this a behavioral health or substance abuse call? ---No Guidelines Guideline Title Affirmed Question Affirmed Notes Nurse Date/Time  ([place] Time) COVID-19 - Diagnosed or Suspected [1] HIGH RISK patient (e.g., weak immune system, age > 64 years, obesity with BMI 30 or Cabe, RN, Joelene Millin 07/31/2022 7:12:03 PM PLEASE NOTE: All timestamps contained within this report are represented as [place] Standard Time. CONFIDENTIALTY NOTICE: This fax transmission is intended only for the addressee. It contains information that is legally privileged, confidential or otherwise protected from use or disclosure. If you are not the intended recipient, you are strictly prohibited from reviewing, disclosing, copying using or disseminating any of this information or taking any action in reliance on or regarding this information. If you have received this fax in error, please notify us immediately by telephone so that we can arrange for its return to [place]. Phone: [phone], Toll-Free: [phone], Fax: [phone] Page: 2 of 2 Call Id: [id number] Guidelines Guideline Title Affirmed Question Affirmed Notes Nurse Date/Time Eilene Ghazi Time) higher, pregnant, chronic lung disease or other chronic medical condition) AND [2] COVID symptoms (e.g., cough, fever) (Exceptions: Already seen by PCP and no new or worsening symptoms.) Disp. Time Eilene Ghazi Time) Disposition Final User 07/31/2022 6:43:51 PM Send To Nurse Krista Blue, RN, Barnetta Chapel 07/31/2022 7:18:12 PM Call PCP within 24 Hours Yes Cabe, RN, Joelene Millin Final Disposition 07/31/2022 7:18:12 PM Call PCP within 24 Hours Yes Cabe, RN, Renea Ee Disagree/Comply Comply Caller Understands Yes PreDisposition Did not know what to do Care Advice Given Per Guideline CALL PCP WITHIN 24 HOURS: * IF OFFICE WILL BE CLOSED: I'll page the on-call provider now. EXCEPTION: from 9 pm to 9 am. Since this isn't urgent, we'll hold the page until morning. GENERAL CARE ADVICE FOR COVID-19 SYMPTOMS: * The symptoms are generally treated the same whether you have COVID-19, influenza or some other  respiratory virus. * Cough: Use  cough drops. COUGH MEDICINES: * COUGH DROPS: Over-the-counter cough drops can help a lot, especially for mild coughs. They soothe an irritated throat and remove the tickle sensation in the back of the throat. Cough drops are easy to carry with you. COVID-19 - HOW TO PROTECT OTHERS - WHEN YOU ARE SICK WITH COVID-19: * STAY HOME A MINIMUM OF 5 DAYS: People with MILD COVID-19 can STOP HOME ISOLATION AFTER 5 DAYS if (1) fever has been gone for 24 hours (without using fever medicine) AND (2) symptoms are better. Continue to wear a well-fitted mask for a full 10 days when around others. * WEAR A MASK FOR 10 DAYS: Wear a well-fitted mask for 10 full days any time you are around others inside your home or in public. Do not go to places where you are unable to wear a mask. CALL BACK IF: * You become worse CARE ADVICE given per COVID-19 - DIAGNOSED OR SUSPECTED (Adult) guideline. Comments User: Hinton Dyer, RN Date/Time Eilene Ghazi Time): 07/31/2022 7:17:19 PM Pt states she will have someone take her to "the urgent care in [facility]

## 2022-08-06 ENCOUNTER — Telehealth: Payer: Self-pay | Admitting: Primary Care

## 2022-08-06 ENCOUNTER — Telehealth: Payer: Medicare Other | Admitting: Physician Assistant

## 2022-08-06 DIAGNOSIS — R059 Cough, unspecified: Secondary | ICD-10-CM | POA: Diagnosis not present

## 2022-08-06 DIAGNOSIS — Z20822 Contact with and (suspected) exposure to covid-19: Secondary | ICD-10-CM | POA: Diagnosis not present

## 2022-08-06 DIAGNOSIS — R110 Nausea: Secondary | ICD-10-CM | POA: Diagnosis not present

## 2022-08-06 DIAGNOSIS — R519 Headache, unspecified: Secondary | ICD-10-CM | POA: Diagnosis not present

## 2022-08-06 DIAGNOSIS — R6883 Chills (without fever): Secondary | ICD-10-CM | POA: Diagnosis not present

## 2022-08-06 MED ORDER — ONDANSETRON 4 MG PO TBDP: 4.0000 mg | ORAL_TABLET | Freq: Three times a day (TID) | ORAL | 0 refills | Status: DC | PRN

## 2022-08-06 NOTE — Progress Notes (Signed)
E-Visit for Nausea and Vomiting   We are sorry that you are not feeling well. Here is how we plan to help!  Based on what you have shared with me it looks like you have a Virus that is irritating your GI tract.  Vomiting is the forceful emptying of a portion of the stomach's content through the mouth.  Although nausea and vomiting can make you feel miserable, it's important to remember that these are not diseases, but rather symptoms of an underlying illness.  When we treat short term symptoms, we always caution that any symptoms that persist should be fully evaluated in a medical office.  I have prescribed a medication that will help alleviate your symptoms and allow you to stay hydrated:  Zofran 4 mg 1 tablet every 8 hours as needed for nausea and vomiting This is preferred in elderly due to increased fall risk associated with Phenergan due to the drowsiness side effects. Zofran ODT is to be placed under the tongue and will dissolve. Make sure hands are dry when removing from package as moisture dissolves the tablet.   HOME CARE: Drink clear liquids.  This is very important! Dehydration (the lack of fluid) can lead to a serious complication.  Start off with 1 tablespoon every 5 minutes for 8 hours. You may begin eating bland foods after 8 hours without vomiting.  Start with saltine crackers, white bread, rice, mashed potatoes, applesauce. After 48 hours on a bland diet, you may resume a normal diet. Try to go to sleep.  Sleep often empties the stomach and relieves the need to vomit.  GET HELP RIGHT AWAY IF:  Your symptoms do not improve or worsen within 2 days after treatment. You have a fever for over 3 days. You cannot keep down fluids after trying the medication.  MAKE SURE YOU:  Understand these instructions. Will watch your condition. Will get help right away if you are not doing well or get worse.    Thank you for choosing an e-visit.  Your e-visit answers were reviewed by a  board certified advanced clinical practitioner to complete your personal care plan. Depending upon the condition, your plan could have included both over the counter or prescription medications.  Please review your pharmacy choice. Make sure the pharmacy is open so you can pick up prescription now. If there is a problem, you may contact your provider through [employer] and have the prescription routed to another pharmacy.  Your safety is important to [place]. If you have drug allergies check your prescription carefully.   For the next 24 hours you can use MyChart to ask questions about today's visit, request a non-urgent call back, or ask for a work or school excuse. You will get an email in the next two days asking about your experience. I hope that your e-visit has been valuable and will speed your recovery.  I have spent 5 minutes in review of e-visit questionnaire, review and updating patient chart, medical decision making and response to patient.   Mar Daring, PA-C

## 2022-08-06 NOTE — Telephone Encounter (Signed)
Noted  

## 2022-08-06 NOTE — Telephone Encounter (Signed)
Patient is scheduled for an OV with Anda Kraft on 08/10/22.Son called in today saying that she is severely nauseous and would like to know if phenergan can be called in for her in the mean time,until she makes it to her appointment on 08/10/22?

## 2022-08-06 NOTE — Telephone Encounter (Signed)
[facility] Day - Client TELEPHONE ADVICE RECORD [facility] Patient Name: Morgan Bowen Gender: Female DOB: 07-Jun-1936 Age: 86 Y 37 M 24 D Return Phone Number: [phone] (Primary) Address: City/ State/ Zip: [place]  [address] Client [facility] - Client Client Site [place] - Day Provider [name] - NP Contact Type Call Who Is Calling Patient / Member / Family / Caregiver Call Type Triage / Clinical Caller Name Juanda Crumble Relationship To Patient Son Return Phone Number [phone] (Primary) Chief Complaint Abdominal Pain Reason for Call Symptomatic / Request for [place] states his mother iis having abdominal pain Translation No Nurse Assessment Nurse: Quentin Cornwall, RN, Imari Date/Time (Eastern Time): 08/06/2022 9:13:38 AM Confirm and document reason for call. If symptomatic, describe symptoms. ---Caller states she is having abdominal pain that is stabbing pain above the belly button near the rib cage. Rates pain 3/10. Has not taken Tylenol or Motrin Does the patient have any new or worsening symptoms? ---Yes Will a triage be completed? ---Yes Related visit to physician within the last 2 weeks? ---No Does the PT have any chronic conditions? (i.e. diabetes, asthma, this includes High risk factors for pregnancy, etc.) ---Yes List chronic conditions. ---diabetes, HTN Is this a behavioral health or substance abuse call? ---No Guidelines Guideline Title Affirmed Question Affirmed Notes Nurse Date/Time Eilene Ghazi Time) Abdominal Pain - Upper [1] MILD pain (e.g., does not interfere with normal activities) AND [2] comes and goes (cramps) AND [3] present > 72 hours (Exception: This same abdominal pain is a chronic symptom recurrent or ongoing Fausto Skillern, [place] 08/06/2022 9:15:23 AM PLEASE NOTE: All timestamps contained within this report are  represented as [place] Standard Time. CONFIDENTIALTY NOTICE: This fax transmission is intended only for the addressee. It contains information that is legally privileged, confidential or otherwise protected from use or disclosure. If you are not the intended recipient, you are strictly prohibited from reviewing, disclosing, copying using or disseminating any of this information or taking any action in reliance on or regarding this information. If you have received this fax in error, please notify us immediately by telephone so that we can arrange for its return to [place]. Phone: [phone], Toll-Free: [phone], Fax: [phone] Page: 2 of 2 Call Id: [id number] Guidelines Guideline Title Affirmed Question Affirmed Notes Nurse Date/Time Eilene Ghazi Time) AND present > 4 weeks.) Disp. Time Eilene Ghazi Time) Disposition Final User 08/06/2022 9:20:16 AM See PCP within 24 Hours Yes Quentin Cornwall RN, Imari Final Disposition 08/06/2022 9:20:16 AM See PCP within 24 Hours Yes Quentin Cornwall, RN, Imari Caller Disagree/Comply Comply Caller Understands Yes PreDisposition Did not know what to do Care Advice Given Per Guideline SEE PCP WITHIN 24 HOURS: * IF OFFICE WILL BE OPEN: You need to be examined within the next 24 hours. Call your doctor (or NP/PA) when the office opens and make an appointment. ANTACID MEDICINE: * Antacids are medicines that help decrease (neutralize) acid in your stomach. * Dose: Take 2 tablespoons (30 ml) of liquid antacid (e.g., Maalox, Mylanta) DRINK CLEAR FLUIDS: * Drink clear fluids only (e.g., water, flat soft drinks or half-strength Gatorade). * Sip small amounts at a time, until you feel better and the pain is gone. CALL BACK IF: * Severe pain present over 1 hour * Constant pain present over 2 hours * You become worse CARE ADVICE given per Abdominal Pain - Upper (Adult) guideline. Comments User: Morton Stall, RN Date/Time Eilene Ghazi Time): 08/06/2022 9:16:32 AM covid sx  of nausea,  diarrhea, and phlegm production 2 weeks ago and the sx have not gone away User: Morton Stall, RN Date/Time Eilene Ghazi Time): 08/06/2022 9:22:20 AM caller warm transferred to the office Referrals REFERRED TO PCP OFFIC

## 2022-08-06 NOTE — Telephone Encounter (Signed)
Patient son Juanda Crumble called in and stated that patient is experiencing severe abdominal pain and very nauseous. He stated that she tested positive for Covid on 07/22/22. Sent over to access nurse.

## 2022-08-06 NOTE — Telephone Encounter (Signed)
I spoke with Juanda Crumble; (DPR signed) pt started last night with abd pain near belly button that would be a sharp jabbing pain that only lasted few seconds but would occur every 10 mins during the night. Pt is not having abd pain now. No vomiting but pt is very nauseated. No cough and no fever. Juanda Crumble said at this time does not want an appt but is going to try dramamine to help with nausea. Juanda Crumble said he would cb if anything further needed. UC & ED precautions given and Juanda Crumble voiced understanding; sending note to Gentry Fitz NP and [facility].

## 2022-08-06 NOTE — Telephone Encounter (Signed)
It looks like patient was evaluated through an e-visit and prescribe Zofran. Will evaluate patient next week as scheduled.

## 2022-08-07 DIAGNOSIS — R6883 Chills (without fever): Secondary | ICD-10-CM | POA: Diagnosis not present

## 2022-08-07 DIAGNOSIS — R519 Headache, unspecified: Secondary | ICD-10-CM | POA: Diagnosis not present

## 2022-08-07 DIAGNOSIS — R059 Cough, unspecified: Secondary | ICD-10-CM | POA: Diagnosis not present

## 2022-08-07 DIAGNOSIS — Z20822 Contact with and (suspected) exposure to covid-19: Secondary | ICD-10-CM | POA: Diagnosis not present

## 2022-08-10 ENCOUNTER — Encounter: Payer: Self-pay | Admitting: Primary Care

## 2022-08-10 ENCOUNTER — Ambulatory Visit (INDEPENDENT_AMBULATORY_CARE_PROVIDER_SITE_OTHER): Payer: Medicare Other | Admitting: Primary Care

## 2022-08-10 VITALS — BP 142/60 | HR 80 | Temp 97.2°F | Ht 65.0 in | Wt 122.0 lb

## 2022-08-10 DIAGNOSIS — U071 COVID-19: Secondary | ICD-10-CM

## 2022-08-10 DIAGNOSIS — E1169 Type 2 diabetes mellitus with other specified complication: Secondary | ICD-10-CM | POA: Diagnosis not present

## 2022-08-10 LAB — POCT GLYCOSYLATED HEMOGLOBIN (HGB A1C): Hemoglobin A1C: 7.8 % — AB (ref 4.0–5.6)

## 2022-08-10 LAB — MICROALBUMIN / CREATININE URINE RATIO
Creatinine,U: 188 mg/dL
Microalb Creat Ratio: 3.8 mg/g (ref 0.0–30.0)
Microalb, Ur: 7.2 mg/dL — ABNORMAL HIGH (ref 0.0–1.9)

## 2022-08-10 MED ORDER — PROMETHAZINE HCL 12.5 MG PO TABS: 12.5000 mg | ORAL_TABLET | Freq: Three times a day (TID) | ORAL | 0 refills | Status: DC | PRN

## 2022-08-10 NOTE — Assessment & Plan Note (Signed)
Gradually improving, does have residual nausea and fatigue.  Exam today overall benign.   Start famotidine 20 mg HS for nausea. Agree to provide short term supply of phenergan 12.5 mg PRN.  Update if no improvement.

## 2022-08-10 NOTE — Assessment & Plan Note (Signed)
Improved with A1C today of 7.8. Discussed to take second dose of glimepiride 2 mg BID. Continue metformin 1000 mg BID.  Urine microalbumin due and pending.

## 2022-08-10 NOTE — Patient Instructions (Signed)
Use the promethazine as needed for nausea.  Start famotidine 20 mg at bedtime for nausea.  Please schedule a follow up visit for 6 months for a diabetes check.  It was a pleasure to see you today!

## 2022-08-10 NOTE — Progress Notes (Signed)
Subjective:    Patient ID: Morgan Bowen, female    DOB: Apr 07, 1936, 87 y.o.   MRN: [id number]  HPI  Morgan Bowen is a very pleasant 87 y.o. female with a history of type 2 diabetes, hypertension, osteoporosis, sinus tachycardia who presents today for follow up of diabetes and follow up from Covid-19.   1) Type 2 Diabetes:  Current medications include: Glimepiride 2 mg BID, metformin 1000 mg BID. She's been taking glimepiride 2 mg once daily.   She is checking her blood glucose 1 times daily and is getting readings ranging 130's  Last A1C: 8.1 in September 2023, 7.8 today Last Eye Exam: UTD Last Foot Exam: Due Pneumonia Vaccination: UTD  Urine Microalbumin: Due  Statin: Declines   Dietary changes since last visit: None   Exercise: None  2) Covid-19 Infection: Positive on 07/23/22, evaluated on 07/27/22, treated with Paxlovid renal dosing. Today she continues with some symptoms including nausea without vomiting, mild cough, fatigue.   She completed an E-Visit on 07/27/22 for ongoing nausea, was prescribed Zofran which has not helped. A neighbor provided her with some phenergan 12.5 mg tablets which has helped. Her nausea is worse at night. Overall she is feeling better.   Review of Systems  Constitutional:  Positive for fatigue. Negative for fever.  Respiratory:  Positive for cough. Negative for chest tightness and shortness of breath.   Gastrointestinal:  Positive for nausea. Negative for vomiting.  Neurological:  Negative for numbness.         Past Medical History:  Diagnosis Date   Acute cystitis 05/23/2018   Acute deep vein thrombosis (DVT) of right tibial vein ([place]) 04/03/2020   Anxiety    Arthritis    Bronchitis 06/17/2015   Cellulitis 12/13/2018   Chickenpox    Diabetes mellitus without complication (HCC)    GERD (gastroesophageal reflux disease)    Hypertension    Osteoporosis    Type 2 diabetes mellitus ([place])    Urinary tract infection     Social History    Socioeconomic History   Marital status: Widowed    Spouse name: Not on file   Number of children: Not on file   Years of education: Not on file   Highest education level: Not on file  Occupational History   Not on file  Tobacco Use   Smoking status: Never   Smokeless tobacco: Never  Vaping Use   Vaping Use: Never used  Substance and Sexual Activity   Alcohol use: No   Drug use: No   Sexual activity: Not Currently  Other Topics Concern   Not on file  Social History Narrative   ** Merged History Encounter **       Widowed. Moved from [place]. 1 son, 3 grandchildren. Retired. Once worked as a Agricultural engineer.    Social Determinants of Health   Financial Resource Strain: Low Risk  (04/13/2022)   Overall Financial Resource Strain (CARDIA)    Difficulty of Paying Living Expenses: Not hard at all  Food Insecurity: No Food Insecurity (04/13/2022)   Hunger Vital Sign    Worried About Running Out of Food in the Last Year: Never true    Ran Out of Food in the Last Year: Never true  Transportation Needs: No Transportation Needs (04/13/2022)   PRAPARE - Hydrologist (Medical): No    Lack of Transportation (Non-Medical): No  Physical Activity: Insufficiently Active (04/13/2022)   Exercise Vital Sign  Days of Exercise per Week: 2 days    Minutes of Exercise per Session: 20 min  Stress: No Stress Concern Present (04/13/2022)   [place]    Feeling of Stress : Not at all  Social Connections: Unknown (04/13/2022)   Social Connection and Isolation Panel [NHANES]    Frequency of Communication with Friends and Family: More than three times a week    Frequency of Social Gatherings with Friends and Family: More than three times a week    Attends Religious Services: Never    Marine scientist or Organizations: No    Attends Archivist Meetings: Never    Marital Status: Not on file   Intimate Partner Violence: Not At Risk (04/13/2022)   Humiliation, Afraid, Rape, and Kick questionnaire    Fear of Current or Ex-Partner: No    Emotionally Abused: No    Physically Abused: No    Sexually Abused: No    Past Surgical History:  Procedure Laterality Date   ABDOMINAL HYSTERECTOMY  [place]  07/13/2016   TONSILLECTOMY  1958   TONSILLECTOMY     WRIST FRACTURE SURGERY Right    2000    Family History  Problem Relation Age of Onset   Hypertension Mother    Stroke Mother    Stroke Father    Hypertension Father    Hypertension Sister    Kidney disease Sister    Breast cancer Neg Hx     Allergies  Allergen Reactions   Sulfamethoxazole-Trimethoprim     Other reaction(s): Not available   Penicillins Rash    Current Outpatient Medications on File Prior to Visit  Medication Sig Dispense Refill   amLODipine (NORVASC) 10 MG tablet TAKE ONE TABLET BY MOUTH ONCE DAILY FOR BLOOD PRESSURE. 90 tablet 2   ascorbic acid (VITAMIN C) 500 MG tablet Take 500 mg by mouth daily.     benzonatate (TESSALON) 200 MG capsule Take 1 capsule (200 mg total) by mouth 3 (three) times daily as needed for cough. 15 capsule 0   Calcium-Magnesium-Vitamin D [id number] MG-MG-UNIT TABS Take 1 tablet by mouth daily.     cloNIDine (CATAPRES) 0.1 MG tablet Take 1 tablet by mouth once daily as needed for systolic blood pressure greater than 170. 30 tablet 0   co-enzyme Q-10 30 MG capsule Take 30 mg by mouth 3 (three) times daily.     gabapentin (NEURONTIN) 100 MG capsule Take 1 capsule (100 mg total) by mouth at bedtime. For foot pain. 30 capsule 0   glimepiride (AMARYL) 2 MG tablet Take 1 tablet (2 mg total) by mouth 2 (two) times daily with a meal. for diabetes. 180 tablet 1   glucose blood (ONE TOUCH ULTRA TEST) test strip USE AS DIRECTED TEST 3 TIMES DAILY E11.9 100 each 2    irbesartan (AVAPRO) 300 MG tablet Take 1 tablet (300 mg total) by mouth daily. for blood pressure. 90 tablet 2   metFORMIN (GLUCOPHAGE) 1000 MG tablet TAKE ONE TABLET BY MOUTH TWICE DAILY WITH A MEAL FOR diabetes 180 tablet 1   metoprolol succinate (TOPROL-XL) 25 MG 24 hr tablet TAKE ONE TABLET BY MOUTH ONCE DAILY FOR blood pressure. 90 tablet 2   Omega-3 Fatty Acids (FISH OIL) 300 MG CAPS Take by mouth.     Turmeric (  QC TUMERIC COMPLEX) 500 MG CAPS Take by mouth.     Vitamin D, Ergocalciferol, (DRISDOL) 1.25 MG (50000 UNIT) CAPS capsule TAKE ONE CAPSULE BY MOUTH ONCE A WEEK. 12 capsule 0   ondansetron (ZOFRAN-ODT) 4 MG disintegrating tablet Take 1 tablet (4 mg total) by mouth every 8 (eight) hours as needed for nausea or vomiting. (Patient not taking: Reported on 08/10/2022) 20 tablet 0   No current facility-administered medications on file prior to visit.    BP (!) 142/60   Pulse 80   Temp (!) 97.2 F (36.2 C) (Temporal)   Ht '5\' 5"'$  (1.651 m)   Wt 122 lb (55.3 kg)   SpO2 99%   BMI 20.30 kg/m  Objective:   Physical Exam Constitutional:      Appearance: She is not ill-appearing.  Cardiovascular:     Rate and Rhythm: Normal rate and regular rhythm.  Pulmonary:     Effort: Pulmonary effort is normal.     Breath sounds: Normal breath sounds.  Musculoskeletal:     Cervical back: Neck supple.  Skin:    General: Skin is warm and dry.           Assessment & Plan:   Problem List Items Addressed This Visit       Endocrine   Type 2 diabetes mellitus with other specified complication ([place]) - Primary    Improved with A1C today of 7.8. Discussed to take second dose of glimepiride 2 mg BID. Continue metformin 1000 mg BID.  Urine microalbumin due and pending.      Relevant Orders   Microalbumin/Creatinine Ratio, Urine   POCT glycosylated hemoglobin (Hb A1C)     Other   COVID-19 virus infection    Gradually improving, does have residual nausea and fatigue.  Exam today  overall benign.   Start famotidine 20 mg HS for nausea. Agree to provide short term supply of phenergan 12.5 mg PRN.  Update if no improvement.       Relevant Medications   promethazine (PHENERGAN) 12.5 MG tablet       [name], NP

## 2022-08-17 DIAGNOSIS — R6883 Chills (without fever): Secondary | ICD-10-CM | POA: Diagnosis not present

## 2022-08-17 DIAGNOSIS — Z20822 Contact with and (suspected) exposure to covid-19: Secondary | ICD-10-CM | POA: Diagnosis not present

## 2022-08-17 DIAGNOSIS — R519 Headache, unspecified: Secondary | ICD-10-CM | POA: Diagnosis not present

## 2022-08-17 DIAGNOSIS — R059 Cough, unspecified: Secondary | ICD-10-CM | POA: Diagnosis not present

## 2022-08-19 DIAGNOSIS — Z20822 Contact with and (suspected) exposure to covid-19: Secondary | ICD-10-CM | POA: Diagnosis not present

## 2022-08-19 DIAGNOSIS — R6883 Chills (without fever): Secondary | ICD-10-CM | POA: Diagnosis not present

## 2022-08-19 DIAGNOSIS — R519 Headache, unspecified: Secondary | ICD-10-CM | POA: Diagnosis not present

## 2022-08-19 DIAGNOSIS — R059 Cough, unspecified: Secondary | ICD-10-CM | POA: Diagnosis not present

## 2022-08-20 DIAGNOSIS — R519 Headache, unspecified: Secondary | ICD-10-CM | POA: Diagnosis not present

## 2022-08-20 DIAGNOSIS — R6883 Chills (without fever): Secondary | ICD-10-CM | POA: Diagnosis not present

## 2022-08-20 DIAGNOSIS — R059 Cough, unspecified: Secondary | ICD-10-CM | POA: Diagnosis not present

## 2022-08-20 DIAGNOSIS — Z20822 Contact with and (suspected) exposure to covid-19: Secondary | ICD-10-CM | POA: Diagnosis not present

## 2022-08-31 DIAGNOSIS — R519 Headache, unspecified: Secondary | ICD-10-CM | POA: Diagnosis not present

## 2022-08-31 DIAGNOSIS — R6883 Chills (without fever): Secondary | ICD-10-CM | POA: Diagnosis not present

## 2022-08-31 DIAGNOSIS — R059 Cough, unspecified: Secondary | ICD-10-CM | POA: Diagnosis not present

## 2022-08-31 DIAGNOSIS — Z20822 Contact with and (suspected) exposure to covid-19: Secondary | ICD-10-CM | POA: Diagnosis not present

## 2022-09-02 ENCOUNTER — Telehealth: Payer: Self-pay

## 2022-09-02 DIAGNOSIS — Z20822 Contact with and (suspected) exposure to covid-19: Secondary | ICD-10-CM | POA: Diagnosis not present

## 2022-09-02 DIAGNOSIS — R519 Headache, unspecified: Secondary | ICD-10-CM | POA: Diagnosis not present

## 2022-09-02 DIAGNOSIS — R059 Cough, unspecified: Secondary | ICD-10-CM | POA: Diagnosis not present

## 2022-09-02 DIAGNOSIS — R6883 Chills (without fever): Secondary | ICD-10-CM | POA: Diagnosis not present

## 2022-09-02 NOTE — Progress Notes (Signed)
Care Management & Coordination Services Pharmacy Team  Reason for Encounter: Appointment Reminder  Contacted patient to confirm telephone appointment with [name],  PharmD, on 09/07/2022 at 3:00.  Unsuccessful outreach. Left voicemail for patient to return call.  Medications: Outpatient Encounter Medications as of 09/02/2022  Medication Sig   amLODipine (NORVASC) 10 MG tablet TAKE ONE TABLET BY MOUTH ONCE DAILY FOR BLOOD PRESSURE.   ascorbic acid (VITAMIN C) 500 MG tablet Take 500 mg by mouth daily.   benzonatate (TESSALON) 200 MG capsule Take 1 capsule (200 mg total) by mouth 3 (three) times daily as needed for cough.   Calcium-Magnesium-Vitamin D [phone] MG-MG-UNIT TABS Take 1 tablet by mouth daily.   cloNIDine (CATAPRES) 0.1 MG tablet Take 1 tablet by mouth once daily as needed for systolic blood pressure greater than 170.   co-enzyme Q-10 30 MG capsule Take 30 mg by mouth 3 (three) times daily.   gabapentin (NEURONTIN) 100 MG capsule Take 1 capsule (100 mg total) by mouth at bedtime. For foot pain.   glimepiride (AMARYL) 2 MG tablet Take 1 tablet (2 mg total) by mouth 2 (two) times daily with a meal. for diabetes.   glucose blood (ONE TOUCH ULTRA TEST) test strip USE AS DIRECTED TEST 3 TIMES DAILY E11.9   irbesartan (AVAPRO) 300 MG tablet Take 1 tablet (300 mg total) by mouth daily. for blood pressure.   metFORMIN (GLUCOPHAGE) 1000 MG tablet TAKE ONE TABLET BY MOUTH TWICE DAILY WITH A MEAL FOR diabetes   metoprolol succinate (TOPROL-XL) 25 MG 24 hr tablet TAKE ONE TABLET BY MOUTH ONCE DAILY FOR blood pressure.   Omega-3 Fatty Acids (FISH OIL) 300 MG CAPS Take by mouth.   ondansetron (ZOFRAN-ODT) 4 MG disintegrating tablet Take 1 tablet (4 mg total) by mouth every 8 (eight) hours as needed for nausea or vomiting. (Patient not taking: Reported on 08/10/2022)   promethazine (PHENERGAN) 12.5 MG tablet Take 1 tablet (12.5 mg total) by mouth every 8 (eight) hours as needed for  nausea or vomiting.   Turmeric (QC TUMERIC COMPLEX) 500 MG CAPS Take by mouth.   Vitamin D, Ergocalciferol, (DRISDOL) 1.25 MG (50000 UNIT) CAPS capsule TAKE ONE CAPSULE BY MOUTH ONCE A WEEK.   No facility-administered encounter medications on file as of 09/02/2022.   Lab Results  Component Value Date/Time   HGBA1C 7.8 (A) 08/10/2022 09:16 AM   HGBA1C 8.1 (H) 04/26/2022 11:47 AM   HGBA1C 7.4 (A) 10/13/2021 09:29 AM   HGBA1C 8.2 (H) 04/15/2021 08:19 AM   MICROALBUR 7.2 (H) 08/10/2022 09:25 AM    BP Readings from Last 3 Encounters:  08/10/22 (!) 142/60  06/24/22 (!) 155/66  04/26/22 124/68    Star Rating Drugs:  Medication:  Last Fill: Day Supply Glimepiride 2 mg 06/23/2022 90 Irbesartan 300 mg 06/23/2022 90 Metformin 1000 mg 06/23/2022 90  Care Gaps: Annual wellness visit in last year? No  If Diabetic: Last eye exam / retinopathy screening: Overdue Last diabetic foot exam: Overdue  [name], PharmD notified  [name], [place] Pharmacy Assistant [phone]

## 2022-09-06 LAB — HM DIABETES EYE EXAM

## 2022-09-07 ENCOUNTER — Encounter: Payer: Medicare Other | Admitting: Pharmacist

## 2022-09-07 ENCOUNTER — Telehealth: Payer: Self-pay | Admitting: Pharmacist

## 2022-09-07 NOTE — Telephone Encounter (Signed)
Care Management & Coordination Services Outreach Note  09/07/2022 Name: Morgan Bowen MRN: [id number] DOB: Dec 25, 1935  Referred by: [name], NP  Patient had a phone appointment scheduled with clinical pharmacist today.  An unsuccessful telephone outreach was attempted today. The patient was referred to the pharmacist for assistance with medications, care management and care coordination.   Patient will NOT be penalized in any way for missing a Care Management & Coordination Services appointment. The no-show fee does not apply.  If possible, a message was left to return call to: [phone] or to [facility].  [name], PharmD, BCACP Clinical Pharmacist [facility] at [facility] [phone]

## 2022-09-07 NOTE — Progress Notes (Unsigned)
Care Management & Coordination Services Pharmacy Note  09/07/2022 Name:  [name] MRN:  [id number] DOB:  June 27, 1936  Summary: -Pt is concerned about drug cost - it is up to nearly $200 per 90 day supply at her current pharmacy; reviewed insurance plan and preferred pharmacies, [facility] is the preferred pharmacy with her plan, she would save money by switching - she will think about this and let pharmacy/PCP know if she wants to switch -Pt reports some improvement in foot pain since starting gabapentin   Recommendations/Changes made from today's visit: -No med changes; emailed patient Medicare.gov medication cost breakdowns for different pharmacies ([facility], [facility], [facility])  Follow up plan: -Health Concierge will call patient *** -Pharmacist follow up televisit scheduled for ***    Subjective: Morgan Bowen is an 87 y.o. year old female who is a primary patient of [name], NP.  The care coordination team was consulted for assistance with disease management and care coordination needs.    Engaged with patient by telephone for follow up visit.  Recent office visits: 08/10/22 NP Allie Bossier OV: f/u - A1c 7.8%. Start famotidine 20 mg HS for nausea. Short term phernergan rx.  07/27/22 NP Allie Bossier OV: covid positive - rx Paxlovid  06/14/22 NP Tabitha Phebe Colla OV: covid positive - rx molnupiravir  04/26/22 NP Allie Bossier OV: f/u - start gabapentin 100 mg for foot pain. Schedule DEXA scan. D/C valsartan, olmesartan, lorazepam, ibandronate - pt preference. A1c 8.1% - opted to work on diet. F/u 3 months.   Recent consult visits: None  Hospital visits: None in previous 6 months   Objective:  Lab Results  Component Value Date   CREATININE 1.13 01/15/2022   BUN 21 01/15/2022   GFR 44.86 (L) 01/15/2022   GFRNONAA 39 (L) 01/11/2022   GFRAA >60 04/13/2020   NA 136 01/15/2022   K 4.8 01/15/2022   CALCIUM 9.9 01/15/2022   CO2 25 01/15/2022   GLUCOSE 183 (H) 01/15/2022     Lab Results  Component Value Date/Time   HGBA1C 7.8 (A) 08/10/2022 09:16 AM   HGBA1C 8.1 (H) 04/26/2022 11:47 AM   HGBA1C 7.4 (A) 10/13/2021 09:29 AM   HGBA1C 8.2 (H) 04/15/2021 08:19 AM   GFR 44.86 (L) 01/15/2022 12:31 PM   GFR 53.50 (L) 04/15/2021 08:19 AM   MICROALBUR 7.2 (H) 08/10/2022 09:25 AM    Last diabetic Eye exam:  Lab Results  Component Value Date/Time   HMDIABEYEEXA No Retinopathy 11/18/2020 12:00 AM    Last diabetic Foot exam: No results found for: "HMDIABFOOTEX"   Lab Results  Component Value Date   CHOL 198 04/26/2022   HDL 49.00 04/26/2022   LDLCALC 121 (H) 04/26/2022   TRIG 142.0 04/26/2022   CHOLHDL 4 04/26/2022       Latest Ref Rng & Units 04/15/2021    8:19 AM 03/31/2020    8:24 AM 06/02/2019   12:59 AM  Hepatic Function  Total Protein 6.0 - 8.3 g/dL 7.9  7.7  8.7   Albumin 3.5 - 5.2 g/dL 4.2  4.2  4.4   AST 0 - 37 U/L '13  17  22   '$ ALT 0 - 35 U/L '15  16  19   '$ Alk Phosphatase 39 - 117 U/L 72  64  81   Total Bilirubin 0.2 - 1.2 mg/dL 0.6  0.6  0.5     Lab Results  Component Value Date/Time   TSH 3.40 04/21/2020 12:08 PM  Latest Ref Rng & Units 01/11/2022    6:33 PM 04/18/2021    5:54 PM 04/13/2020    9:37 PM  CBC  WBC 4.0 - 10.5 K/uL 10.9  9.3  8.8   Hemoglobin 12.0 - 15.0 g/dL 12.6  12.1  12.0   Hematocrit 36.0 - 46.0 % 38.3  33.5  34.9   Platelets 150 - 400 K/uL 295  262  255     Lab Results  Component Value Date/Time   VD25OH 63.15 04/26/2022 11:47 AM   VD25OH 78.93 04/15/2021 08:19 AM    Clinical ASCVD: No  The ASCVD Risk score (Arnett DK, et al., 2019) failed to calculate for the following reasons:   The 2019 ASCVD risk score is only valid for ages 5 to 32       04/13/2022    9:38 AM 10/13/2021    9:25 AM 04/14/2021    9:08 AM  Depression screen PHQ 2/9  Decreased Interest 0 0 3  Down, Depressed, Hopeless 0 0 0  PHQ - 2 Score 0 0 3  Altered sleeping 0 0 2  Tired, decreased energy 0 0 0  Change in appetite 0 0 0   Feeling bad or failure about yourself  0 0 0  Trouble concentrating 0 0 0  Moving slowly or fidgety/restless 0 0 0  Suicidal thoughts 0 0 0  PHQ-9 Score 0 0 5  Difficult doing work/chores Not difficult at all Not difficult at all Somewhat difficult     Social History   Tobacco Use  Smoking Status Never  Smokeless Tobacco Never   BP Readings from Last 3 Encounters:  08/10/22 (!) 142/60  06/24/22 (!) 155/66  04/26/22 124/68   Pulse Readings from Last 3 Encounters:  08/10/22 80  06/24/22 85  04/26/22 73   Wt Readings from Last 3 Encounters:  08/10/22 122 lb (55.3 kg)  06/24/22 123 lb (55.8 kg)  04/26/22 123 lb (55.8 kg)   BMI Readings from Last 3 Encounters:  08/10/22 20.30 kg/m  06/24/22 20.47 kg/m  04/26/22 20.47 kg/m    Allergies  Allergen Reactions   Sulfamethoxazole-Trimethoprim     Other reaction(s): Not available   Penicillins Rash    Medications Reviewed Today     Reviewed by [name], NP (Nurse Practitioner) on 08/10/22 at [place] [facility] Status: <None>   Medication Order Taking? Sig Documenting Provider Last Dose Status Informant  amLODipine (NORVASC) 10 MG tablet [id number] Yes TAKE ONE TABLET BY MOUTH ONCE DAILY FOR BLOOD PRESSURE. [name], NP Taking Active   ascorbic acid (VITAMIN C) 500 MG tablet [id number] Yes Take 500 mg by mouth daily. [provider] Taking Active   benzonatate (TESSALON) 200 MG capsule [id number] Yes Take 1 capsule (200 mg total) by mouth 3 (three) times daily as needed for cough. [name], NP Taking Active   Calcium-Magnesium-Vitamin D [phone] MG-MG-UNIT TABS [id number] Yes Take 1 tablet by mouth daily. [provider] Taking Active   cloNIDine (CATAPRES) 0.1 MG tablet [id number] Yes Take 1 tablet by mouth once daily as needed for systolic blood pressure greater than 170. [name], NP Taking Active   co-enzyme Q-10 30 MG capsule [id number] Yes Take 30 mg by mouth 3  (three) times daily. [provider] Taking Active   gabapentin (NEURONTIN) 100 MG capsule [id number] Yes Take 1 capsule (100 mg total) by mouth at bedtime. For foot pain. [name], NP Taking Active   glimepiride (  AMARYL) 2 MG tablet [id number] Yes Take 1 tablet (2 mg total) by mouth 2 (two) times daily with a meal. for diabetes. [name], NP Taking Active   glucose blood (ONE TOUCH ULTRA TEST) test strip [id number] Yes USE AS DIRECTED TEST 3 TIMES DAILY E11.9 [name], NP Taking Active   irbesartan (AVAPRO) 300 MG tablet [id number] Yes Take 1 tablet (300 mg total) by mouth daily. for blood pressure. [name], NP Taking Active   metFORMIN (GLUCOPHAGE) 1000 MG tablet [id number] Yes TAKE ONE TABLET BY MOUTH TWICE DAILY WITH A MEAL FOR diabetes [name], NP Taking Active   metoprolol succinate (TOPROL-XL) 25 MG 24 hr tablet [id number] Yes TAKE ONE TABLET BY MOUTH ONCE DAILY FOR blood pressure. [name], NP Taking Active   Omega-3 Fatty Acids (FISH OIL) 300 MG CAPS [id number] Yes Take by mouth. [provider] Taking Active   ondansetron (ZOFRAN-ODT) 4 MG disintegrating tablet [id number] No Take 1 tablet (4 mg total) by mouth every 8 (eight) hours as needed for nausea or vomiting.  Patient not taking: Reported on 08/10/2022   Mar Daring, PA-C Not Taking Active   promethazine (PHENERGAN) 12.5 MG tablet [id number] Yes Take 1 tablet (12.5 mg total) by mouth every 8 (eight) hours as needed for nausea or vomiting. [name], NP  Active   Turmeric (QC TUMERIC COMPLEX) 500 MG CAPS [id number] Yes Take by mouth. [provider] Taking Active   Vitamin D, Ergocalciferol, (DRISDOL) 1.25 MG (50000 UNIT) CAPS capsule [id number] Yes TAKE ONE CAPSULE BY MOUTH ONCE A WEEK. [name], NP Taking Active             SDOH:  (Social Determinants of Health) assessments and interventions performed: {yes/no:20286} SDOH  Interventions    Flowsheet Row Clinical Support from 04/13/2022 in [place] at [place] [facility] from 10/24/2020 in [place] at [place] from 04/01/2020 in [place] [facility] [place] [facility] from 01/30/2020 in [place] at [place] Interventions Intervention Not Indicated -- -- --  Housing Interventions Intervention Not Indicated -- -- --  Transportation Interventions Intervention Not Indicated -- -- --  Utilities Interventions Intervention Not Indicated -- -- --  Alcohol Usage Interventions Intervention Not Indicated (Score <7) -- -- --  Depression Interventions/Treatment  -- -- KXF8-1 Score <4 Follow-up Not Indicated --  Financial Strain Interventions Intervention Not Indicated Intervention Not Indicated -- Financial Counselor  [Referred to [facility]]  Physical Activity Interventions Intervention Not Indicated -- -- --  Stress Interventions Intervention Not Indicated -- -- --  Social Connections Interventions Intervention Not Indicated -- -- --       Medication Assistance: {MEDASSISTANCEINFO:25044}  Medication Access: Within the past 30 days, how often has patient missed a dose of medication? *** Is a pillbox or other method used to improve adherence? {YES/NO:21197} Factors that may affect medication adherence? {CHL DESC; BARRIERS:21522} Are meds synced by current pharmacy? {YES/NO:21197} Are meds delivered by current pharmacy? {YES/NO:21197} Does patient experience delays in picking up medications due to transportation concerns? {YES/NO:21197}  [facility] Reviewed: Is patient disadvantaged to use [facility]?: {YES/NO:21197} Current Rx insurance plan: *** Name and location of Current pharmacy:  [facility] - [place], [place] - [address] [place] [address] Phone: [phone] Fax:  [phone]  [id number]: [phone]: [phone] [facility] services reviewed with patient today?: {YES/NO:21197} Patient requests to transfer care to [facility]?: {YES/NO:21197} Reason patient declined to change pharmacies: {US patient preference:27474}  Compliance/Adherence/Medication fill history: Care Gaps: Foot exam (due 01/2022)  Star-Rating Drugs: Glimepiride - PDC 100% Irbesartan - PDC 100% Metformin - PDC 93%   Assessment/Plan   Current Barriers:  Unable to maintain control of DM Increasing pharmacy costs  Hypertension (BP goal <140/90) -Controlled - BP at goal in recent OV -Current home readings: none available -Current treatment: Irbesartan 300 mg daily - Appropriate, Effective, Safe, Accessible Metoprolol succinate 25 mg daily -Appropriate, Effective, Safe, Accessible Amlodipine 10 mg daily -Appropriate, Effective, Safe, Accessible Clonidine 0.1 mg PRN SBP > 170 mmHg (patient has never taken)  -Medications previously tried: none   -Current exercise habits: walking daily  -Denies hypotensive/hypertensive symptoms; She had her BP monitor calibrated recently. -Counseled to monitor BP at home weekly, document, and provide log at future appointments -Recommended to continue current medication; Continue to keep home BP logs.  Diabetes (A1c goal <8%) -{US controlled/uncontrolled:25276} A1c 7.8% (08/2022) -Current home glucose readings: Denies hypoglycemic/hyperglycemic symptoms -Current medications: Glimepiride 2 mg - 2 tab at lunch -Appropriate, Query Effective Metformin 1000 mg BID -Appropriate, Query Effective -Medications previously tried: none   -Reviewed hx of multiple UTIs - would avoid SGLT2-inhibitor -Recommended to continue current medication -Recommend foot exam at next OV  Hyperlipidemia:  (LDL goal < 100) -Not ideally controlled - LDL 121 (04/2022); pt has declined statin therapy despite recommendations with DM -Current treatment: None -Medications previously tried: none  -Counseled on diet and exercise extensively  Osteoporosis (Goal prevent fractures) -Not ideally controlled - pt has declined bisphosphonate and Prolia on multiple occasions despite recommendations from PCP -Hx compression fracture of lumbar vertabra -Last DEXA Scan: 07/2019   T-Score femoral neck: -2.7  T-Score total hip: -2.2  T-Score lumbar spine: n/a  T-Score forearm radius: -3.3 -Patient is a candidate for pharmacologic treatment due to T-Score < -2.5 in femoral neck -Current treatment  Vitamin D 50,000 IU weekly - Appropriate, Effective, Safe, Accessible -Medications previously tried: n/a  -Recommend weight-bearing and muscle strengthening exercises for building and maintaining bone density.  Foot pain (Goal: manage pain) -Improved - pt reports some improvement since starting gabapentin; she denies excess fatigue -Current treatment  Gabapentin 100 mg daily - Appropriate, Effective, Safe, Accessible -Medications previously tried: n/a  -Recommended to continue current medication  Chronic Kidney Disease Stage 3a  -All medications assessed for renal dosing and appropriateness in chronic kidney disease. -Recommended to continue current medication  Health Maintenance -Vaccine gaps: None   LCharlene Brooke PharmD, BCACP Clinical [facility]ist [phone]

## 2022-09-11 DIAGNOSIS — Z20822 Contact with and (suspected) exposure to covid-19: Secondary | ICD-10-CM | POA: Diagnosis not present

## 2022-09-11 DIAGNOSIS — R519 Headache, unspecified: Secondary | ICD-10-CM | POA: Diagnosis not present

## 2022-09-11 DIAGNOSIS — R059 Cough, unspecified: Secondary | ICD-10-CM | POA: Diagnosis not present

## 2022-09-11 DIAGNOSIS — R6883 Chills (without fever): Secondary | ICD-10-CM | POA: Diagnosis not present

## 2022-09-13 ENCOUNTER — Other Ambulatory Visit: Payer: Self-pay | Admitting: Primary Care

## 2022-09-13 DIAGNOSIS — Z20822 Contact with and (suspected) exposure to covid-19: Secondary | ICD-10-CM | POA: Diagnosis not present

## 2022-09-13 DIAGNOSIS — R6883 Chills (without fever): Secondary | ICD-10-CM | POA: Diagnosis not present

## 2022-09-13 DIAGNOSIS — R059 Cough, unspecified: Secondary | ICD-10-CM | POA: Diagnosis not present

## 2022-09-13 DIAGNOSIS — M810 Age-related osteoporosis without current pathological fracture: Secondary | ICD-10-CM

## 2022-09-13 DIAGNOSIS — R519 Headache, unspecified: Secondary | ICD-10-CM | POA: Diagnosis not present

## 2022-09-14 ENCOUNTER — Telehealth: Payer: Self-pay

## 2022-09-14 NOTE — Progress Notes (Signed)
Care Management & Coordination Services Pharmacy Team  Reason for Encounter: Medication coordination and delivery  Contacted patient to discuss medications and coordinate delivery from [facility].  {US HC Outreach:28874}  Cycle dispensing form sent to *** for review.   Last adherence delivery date:  06/28/2022      Patient is due for next adherence delivery on: 09/24/2022  This delivery to include: Vials  90 Days   No safety caps  VIAL medications: Amlodipine 85m-  1 tablet daily (breakfast) Metoprolol 22m 1 tablet daily (bedtime) Irbesartan 30068m1 tablet daily (bedtime) Glimepiride 2mg71m2 tablets daily (2- lunch) Metformin 1000mg36mablet twice daily (1 breakfast, 1 evening meal) Vitamin D 12.50 mcg (50,000 units) - 1 tablet every Friday (breakfast)   Patient declined the following medications this month: None  Refills requested from providers include: Vitamin D 12.50 mcg (50,000 units) - 1 tablet every Friday (breakfast)  {Delivery date:BK:19111896/2024  Any concerns about your medications? {yes/no:20286}  How often do you forget or accidentally miss a dose? {Missed doses:25554}  Do you use a pillbox? Yes  Is patient in packaging No  Recent blood pressure readings are as follows:***  Recent blood glucose readings are as follows:***   Chart review: Recent office visits:  08/10/22 KatheAlma FriendlyDM Microalbumin 7.2 A1c: 7.8 Start: Promethazine 12.5 mg F/U 6 months 07/27/22 KatheAlma FriendlyCovid-19 Start: Benzonatate 200 mg Start: Paxlovid 10 x 150 MG Start: Ondansetron 4 mg  Recent consult visits:  08/06/22 JenniFenton MallingNausea Refill: Ondansetron 4 mg 06/24/22 TabitEugenia [phone]: Molnupiravir 800 mg  Hospital visits:  None in previous 6 months  Medications: Outpatient Encounter Medications as of 09/14/2022  Medication Sig   amLODipine (NORVASC) 10 MG tablet TAKE ONE TABLET BY MOUTH ONCE DAILY FOR BLOOD PRESSURE.    ascorbic acid (VITAMIN C) 500 MG tablet Take 500 mg by mouth daily.   benzonatate (TESSALON) 200 MG capsule Take 1 capsule (200 mg total) by mouth 3 (three) times daily as needed for cough.   Calcium-Magnesium-Vitamin D [id number]-UNIT TABS Take 1 tablet by mouth daily.   cloNIDine (CATAPRES) 0.1 MG tablet Take 1 tablet by mouth once daily as needed for systolic blood pressure greater than 170.   co-enzyme Q-10 30 MG capsule Take 30 mg by mouth 3 (three) times daily.   gabapentin (NEURONTIN) 100 MG capsule Take 1 capsule (100 mg total) by mouth at bedtime. For foot pain.   glimepiride (AMARYL) 2 MG tablet Take 1 tablet (2 mg total) by mouth 2 (two) times daily with a meal. for diabetes.   glucose blood (ONE TOUCH ULTRA TEST) test strip USE AS DIRECTED TEST 3 TIMES DAILY E11.9   irbesartan (AVAPRO) 300 MG tablet Take 1 tablet (300 mg total) by mouth daily. for blood pressure.   metFORMIN (GLUCOPHAGE) 1000 MG tablet TAKE ONE TABLET BY MOUTH TWICE DAILY WITH A MEAL FOR diabetes   metoprolol succinate (TOPROL-XL) 25 MG 24 hr tablet TAKE ONE TABLET BY MOUTH ONCE DAILY FOR blood pressure.   Omega-3 Fatty Acids (FISH OIL) 300 MG CAPS Take by mouth.   ondansetron (ZOFRAN-ODT) 4 MG disintegrating tablet Take 1 tablet (4 mg total) by mouth every 8 (eight) hours as needed for nausea or vomiting. (Patient not taking: Reported on 08/10/2022)   promethazine (PHENERGAN) 12.5 MG tablet Take 1 tablet (12.5 mg total) by mouth every 8 (eight) hours as needed for nausea or vomiting.   Turmeric (QC TUMERIC COMPLEX)  500 MG CAPS Take by mouth.   Vitamin D, Ergocalciferol, (DRISDOL) 1.25 MG (50000 UNIT) CAPS capsule TAKE ONE CAPSULE BY MOUTH ONCE A WEEK   No facility-administered encounter medications on file as of 09/14/2022.   BP Readings from Last 3 Encounters:  08/10/22 (!) 142/60  06/24/22 (!) 155/66  04/26/22 124/68    Pulse Readings from Last 3 Encounters:  08/10/22 80  06/24/22 85  04/26/22 73     Lab Results  Component Value Date/Time   HGBA1C 7.8 (A) 08/10/2022 09:16 AM   HGBA1C 8.1 (H) 04/26/2022 11:47 AM   HGBA1C 7.4 (A) 10/13/2021 09:29 AM   HGBA1C 8.2 (H) 04/15/2021 08:19 AM   Lab Results  Component Value Date   CREATININE 1.13 01/15/2022   BUN 21 01/15/2022   GFR 44.86 (L) 01/15/2022   GFRNONAA 39 (L) 01/11/2022   GFRAA >60 04/13/2020   NA 136 01/15/2022   K 4.8 01/15/2022   CALCIUM 9.9 01/15/2022   CO2 25 01/15/2022   Morgan Bowen, PharmD notified  Morgan Bowen, [place] Pharmacy Assistant [phone]

## 2022-10-12 ENCOUNTER — Telehealth: Payer: Self-pay

## 2022-10-12 NOTE — Progress Notes (Signed)
Care Management & Coordination Services Pharmacy Team  Reason for Encounter: Medication coordination and delivery  Contacted patient to discuss medications and coordinate delivery from [facility].  Unsuccessful outreach. Left voicemail for patient to return call.  Cycle dispensing form sent to [name], PharmD for review.   Last adherence delivery date: 09/24/2022      Patient is due for next adherence delivery on: 10/25/2022  This delivery to include: Vials  90 Days   No safety caps  VIAL medications: Amlodipine '10mg'$ -  1 tablet daily (breakfast) Metoprolol '25mg'$ - 1 tablet daily (bedtime) Irbesartan '300mg'$ - 1 tablet daily (bedtime) Glimepiride '2mg'$  - 2 tablets daily (2- lunch) Metformin '1000mg'$  1 tablet twice daily (1 breakfast, 1 evening meal) Vitamin D 12.50 mcg (50,000 units) - 1 tablet every Friday (breakfast)   Patient declined the following medications this month:  No refill request needed.  Delivery scheduled for 10/25/2022. Unable to speak with patient to confirm date.    Chart review: Recent office visits:  None since last contact  Recent consult visits:  None since last contact  Hospital visits:  None in previous 6 months  Medications: Outpatient Encounter Medications as of 10/12/2022  Medication Sig   amLODipine (NORVASC) 10 MG tablet TAKE ONE TABLET BY MOUTH ONCE DAILY FOR BLOOD PRESSURE.   ascorbic acid (VITAMIN C) 500 MG tablet Take 500 mg by mouth daily.   benzonatate (TESSALON) 200 MG capsule Take 1 capsule (200 mg total) by mouth 3 (three) times daily as needed for cough.   Calcium-Magnesium-Vitamin D [id number] MG-MG-UNIT TABS Take 1 tablet by mouth daily.   cloNIDine (CATAPRES) 0.1 MG tablet Take 1 tablet by mouth once daily as needed for systolic blood pressure greater than 170.   co-enzyme Q-10 30 MG capsule Take 30 mg by mouth 3 (three) times daily.   gabapentin (NEURONTIN) 100 MG capsule Take 1 capsule (100 mg total) by mouth at  bedtime. For foot pain.   glimepiride (AMARYL) 2 MG tablet Take 1 tablet (2 mg total) by mouth 2 (two) times daily with a meal. for diabetes.   glucose blood (ONE TOUCH ULTRA TEST) test strip USE AS DIRECTED TEST 3 TIMES DAILY E11.9   irbesartan (AVAPRO) 300 MG tablet Take 1 tablet (300 mg total) by mouth daily. for blood pressure.   metFORMIN (GLUCOPHAGE) 1000 MG tablet TAKE ONE TABLET BY MOUTH TWICE DAILY WITH A MEAL FOR diabetes   metoprolol succinate (TOPROL-XL) 25 MG 24 hr tablet TAKE ONE TABLET BY MOUTH ONCE DAILY FOR blood pressure.   Omega-3 Fatty Acids (FISH OIL) 300 MG CAPS Take by mouth.   ondansetron (ZOFRAN-ODT) 4 MG disintegrating tablet Take 1 tablet (4 mg total) by mouth every 8 (eight) hours as needed for nausea or vomiting. (Patient not taking: Reported on 08/10/2022)   promethazine (PHENERGAN) 12.5 MG tablet Take 1 tablet (12.5 mg total) by mouth every 8 (eight) hours as needed for nausea or vomiting.   Turmeric (QC TUMERIC COMPLEX) 500 MG CAPS Take by mouth.   Vitamin D, Ergocalciferol, (DRISDOL) 1.25 MG (50000 UNIT) CAPS capsule TAKE ONE CAPSULE BY MOUTH ONCE A WEEK   No facility-administered encounter medications on file as of 10/12/2022.   BP Readings from Last 3 Encounters:  08/10/22 (!) 142/60  06/24/22 (!) 155/66  04/26/22 124/68    Pulse Readings from Last 3 Encounters:  08/10/22 80  06/24/22 85  04/26/22 73    Lab Results  Component Value Date/Time   HGBA1C 7.8 (A) 08/10/2022 09:16 AM  HGBA1C 8.1 (H) 04/26/2022 11:47 AM   HGBA1C 7.4 (A) 10/13/2021 09:29 AM   HGBA1C 8.2 (H) 04/15/2021 08:19 AM   Lab Results  Component Value Date   CREATININE 1.13 01/15/2022   BUN 21 01/15/2022   GFR 44.86 (L) 01/15/2022   GFRNONAA 39 (L) 01/11/2022   GFRAA >60 04/13/2020   NA 136 01/15/2022   K 4.8 01/15/2022   CALCIUM 9.9 01/15/2022   CO2 25 01/15/2022   [name], PharmD notified  [name], [place] Clinical Pharmacy Assistant [phone]

## 2022-11-11 ENCOUNTER — Telehealth: Payer: Self-pay

## 2022-11-11 NOTE — Progress Notes (Signed)
Care Management & Coordination Services Pharmacy Team  Reason for Encounter: Medication Coordination and Delivery  Contacted patient to discuss medications and coordinate delivery from [facility].  Spoke with patient on 11/11/2022   Cycle dispensing form sent to [facility], [facility] for review.   Last adherence delivery date: 10/25/2022      Patient is due for next adherence delivery on: 11/24/2022  This delivery to include: Vials  90 Days   No safety caps  VIAL medications: Amlodipine 10mg -  1 tablet daily (breakfast) Metoprolol 25mg - 1 tablet daily (bedtime) Irbesartan 300mg - 1 tablet daily (bedtime) Glimepiride 2mg  - 2 tablets daily (2- lunch) Metformin 1000mg  1 tablet twice daily (1 breakfast, 1 evening meal) Vitamin D 12.50 mcg (50,000 units) - 1 tablet every Friday (breakfast)  Patient declined the following medications this month: None  No refill request needed.  Confirmed delivery date of 11/24/2022, advised patient that pharmacy will contact them the morning of delivery.   Any concerns about your medications? No  How often do you forget or accidentally miss a dose? Rarely  Do you use a pillbox? Yes  Is patient in packaging No   Recent blood pressure readings are as follows: Pt did not have readings with her at the time of the call.   Recent blood glucose readings are as follows: Pt did not have readings with her at the time of the call.   Chart review: Recent office visits:  None since last contact  Recent consult visits:  None since last contact  Hospital visits:  None in previous 6 months  Medications: Outpatient Encounter Medications as of 11/11/2022  Medication Sig   amLODipine (NORVASC) 10 MG tablet TAKE ONE TABLET BY MOUTH ONCE DAILY FOR BLOOD PRESSURE.   ascorbic acid (VITAMIN C) 500 MG tablet Take 500 mg by mouth daily.   benzonatate (TESSALON) 200 MG capsule Take 1 capsule (200 mg total) by mouth 3 (three) times daily as needed for  cough.   Calcium-Magnesium-Vitamin D [id number] MG-MG-UNIT TABS Take 1 tablet by mouth daily.   cloNIDine (CATAPRES) 0.1 MG tablet Take 1 tablet by mouth once daily as needed for systolic blood pressure greater than 170.   co-enzyme Q-10 30 MG capsule Take 30 mg by mouth 3 (three) times daily.   gabapentin (NEURONTIN) 100 MG capsule Take 1 capsule (100 mg total) by mouth at bedtime. For foot pain.   glimepiride (AMARYL) 2 MG tablet Take 1 tablet (2 mg total) by mouth 2 (two) times daily with a meal. for diabetes.   glucose blood (ONE TOUCH ULTRA TEST) test strip USE AS DIRECTED TEST 3 TIMES DAILY E11.9   irbesartan (AVAPRO) 300 MG tablet Take 1 tablet (300 mg total) by mouth daily. for blood pressure.   metFORMIN (GLUCOPHAGE) 1000 MG tablet TAKE ONE TABLET BY MOUTH TWICE DAILY WITH A MEAL FOR diabetes   metoprolol succinate (TOPROL-XL) 25 MG 24 hr tablet TAKE ONE TABLET BY MOUTH ONCE DAILY FOR blood pressure.   Omega-3 Fatty Acids (FISH OIL) 300 MG CAPS Take by mouth.   ondansetron (ZOFRAN-ODT) 4 MG disintegrating tablet Take 1 tablet (4 mg total) by mouth every 8 (eight) hours as needed for nausea or vomiting. (Patient not taking: Reported on 08/10/2022)   promethazine (PHENERGAN) 12.5 MG tablet Take 1 tablet (12.5 mg total) by mouth every 8 (eight) hours as needed for nausea or vomiting.   Turmeric (QC TUMERIC COMPLEX) 500 MG CAPS Take by mouth.   Vitamin D, Ergocalciferol, (DRISDOL) 1.25 MG (  50000 UNIT) CAPS capsule TAKE ONE CAPSULE BY MOUTH ONCE A WEEK   No facility-administered encounter medications on file as of 11/11/2022.   BP Readings from Last 3 Encounters:  08/10/22 (!) 142/60  06/24/22 (!) 155/66  04/26/22 124/68    Pulse Readings from Last 3 Encounters:  08/10/22 80  06/24/22 85  04/26/22 73    Lab Results  Component Value Date/Time   HGBA1C 7.8 (A) 08/10/2022 09:16 AM   HGBA1C 8.1 (H) 04/26/2022 11:47 AM   HGBA1C 7.4 (A) 10/13/2021 09:29 AM   HGBA1C 8.2 (H) 04/15/2021  08:19 AM   Lab Results  Component Value Date   CREATININE 1.13 01/15/2022   BUN 21 01/15/2022   GFR 44.86 (L) 01/15/2022   GFRNONAA 39 (L) 01/11/2022   GFRAA >60 04/13/2020   NA 136 01/15/2022   K 4.8 01/15/2022   CALCIUM 9.9 01/15/2022   CO2 25 01/15/2022   [name], CPP notified   [name], [place] Clinical Pharmacy Assistant [phone]

## 2022-11-18 ENCOUNTER — Ambulatory Visit (INDEPENDENT_AMBULATORY_CARE_PROVIDER_SITE_OTHER): Payer: Medicare Other | Admitting: Podiatry

## 2022-11-18 ENCOUNTER — Encounter: Payer: Self-pay | Admitting: Podiatry

## 2022-11-18 VITALS — BP 147/62 | HR 89

## 2022-11-18 DIAGNOSIS — M79675 Pain in left toe(s): Secondary | ICD-10-CM | POA: Diagnosis not present

## 2022-11-18 DIAGNOSIS — B351 Tinea unguium: Secondary | ICD-10-CM

## 2022-11-18 DIAGNOSIS — M79674 Pain in right toe(s): Secondary | ICD-10-CM | POA: Diagnosis not present

## 2022-11-18 DIAGNOSIS — E119 Type 2 diabetes mellitus without complications: Secondary | ICD-10-CM

## 2022-11-18 NOTE — Progress Notes (Signed)
Complaint:  °Visit Type: This patient presents to the office for at risk foot care patient requires this care by professional since  this patient will be at risk  due to having diabetes.  Patient is unable to cut her own toenails since she cannot reach down.  She presents for at risk foot care today.   ° °Podiatric Exam: °Vascular: dorsalis pedis and posterior tibial pulses are palpable bilateral. Capillary return is immediate. Temperature gradient is WNL. Skin turgor WNL  °Sensorium: Normal Semmes Weinstein monofilament test. Normal tactile sensation bilaterally. °Nail Exam: Pt has thick disfigured discolored nails with subungual debris noted bilateral entire nail hallux.  Nails are thick disfigured and discolored hallux toenails. °Ulcer Exam: There is no evidence of ulcer or pre-ulcerative changes or infection. °Orthopedic Exam: Muscle tone and strength are WNL. No limitations in general ROM. No crepitus or effusions noted. Foot type and digits show no abnormalities. Bony prominences are unremarkable. °Skin: No Porokeratosis. No infection or ulcers ° °Diagnosis:  °Onychomycosis, , Pain in right toe, pain in left toes  Midfoot arthritis. ° °Treatment & Plan °Procedures and Treatment: Consent by patient was obtained for treatment procedures.   Debridement of mycotic and hypertrophic toenails, 1 through 5 bilateral and clearing of subungual debris. No ulceration, no infection noted.  Told the patient to return for periodic foot evaluation to reduce potential  at risk complications   °Return Visit-Office Procedure: Patient instructed to return to the office for a follow up visit 3 months for continued evaluation and treatment. ° ° ° °Jessica Seidman DPM °

## 2022-12-08 ENCOUNTER — Other Ambulatory Visit: Payer: Self-pay | Admitting: Primary Care

## 2022-12-08 DIAGNOSIS — E119 Type 2 diabetes mellitus without complications: Secondary | ICD-10-CM

## 2022-12-10 ENCOUNTER — Encounter: Payer: Self-pay | Admitting: Primary Care

## 2022-12-10 ENCOUNTER — Ambulatory Visit (INDEPENDENT_AMBULATORY_CARE_PROVIDER_SITE_OTHER)
Admission: RE | Admit: 2022-12-10 | Discharge: 2022-12-10 | Disposition: A | Discharge status: Home / Self Care | Payer: Medicare Other | Source: Ambulatory Visit | Attending: Primary Care | Admitting: Primary Care

## 2022-12-10 ENCOUNTER — Encounter: Payer: Self-pay | Admitting: *Deleted

## 2022-12-10 ENCOUNTER — Ambulatory Visit (INDEPENDENT_AMBULATORY_CARE_PROVIDER_SITE_OTHER): Payer: Medicare Other | Admitting: Primary Care

## 2022-12-10 VITALS — BP 140/88 | HR 84 | Temp 98.4°F | Ht 65.0 in | Wt 123.0 lb

## 2022-12-10 DIAGNOSIS — W19XXXA Unspecified fall, initial encounter: Secondary | ICD-10-CM

## 2022-12-10 DIAGNOSIS — M79602 Pain in left arm: Secondary | ICD-10-CM

## 2022-12-10 DIAGNOSIS — R2689 Other abnormalities of gait and mobility: Secondary | ICD-10-CM

## 2022-12-10 DIAGNOSIS — M546 Pain in thoracic spine: Secondary | ICD-10-CM | POA: Diagnosis not present

## 2022-12-10 DIAGNOSIS — M79609 Pain in unspecified limb: Secondary | ICD-10-CM

## 2022-12-10 DIAGNOSIS — M79632 Pain in left forearm: Secondary | ICD-10-CM | POA: Diagnosis not present

## 2022-12-10 HISTORY — DX: Unspecified fall, initial encounter: W19.XXXA

## 2022-12-10 NOTE — Assessment & Plan Note (Signed)
One week ago.  Will obtain xrays of left forearm and thoracic back. Neuro exam benign and she has no symptoms of headaches, dizziness, etc. Will hold of on CT head for now.  Referral placed for PT to help with imbalance. Discussed to use cane daily.

## 2022-12-10 NOTE — Assessment & Plan Note (Signed)
With recent fall.  Referral placed for physical therapy.

## 2022-12-10 NOTE — Patient Instructions (Signed)
Return for xray at 2 pm today.  You will either be contacted via phone regarding your referral to physical therapy, or you may receive a letter on your MyChart portal from our referral team with instructions for scheduling an appointment. Please let us know if you have not been contacted by anyone within two weeks.  It was a pleasure to see you today!

## 2022-12-10 NOTE — Assessment & Plan Note (Signed)
To left forearm.  Tenderness on exam. Given history of osteoporosis, coupled with recent fall, will obtain xray of left forearm.  Await results.

## 2022-12-10 NOTE — Progress Notes (Signed)
Subjective:    Patient ID: Morgan Bowen, female    DOB: 1935/12/24, 87 y.o.   MRN: [id number]  Fall Pertinent negatives include no headaches.    Morgan Bowen is a very pleasant 87 y.o. female with a history of hypertension, type 2 diabetes, osteoporosis, tachycardia, non traumatic compression fracture who presents today to discuss a fall.   She accidentally fell one week ago. She was walking out of a restaurant, lost her footing while stepping around someone in [employer]. She fell forward and landed onto her left side. She hit her head and sustained a laceration to her left eye, bruising to the left side of her face and left upper extremity, and abrasions to her bilateral knees.    She was able to ambulate within a few minutes of falling. EMS and the fire department came to evaluate her at the restaurant. She was cleared cognitively and vital signs were stable.   Since her fall she denies headaches, dizziness. Her left sided facial laceration is healing and her left sided facial swelling has reduced. She is no longer on apixaban or other blood thinners.   She does continue to notice left upper extremity pain and bilateral knee pain. She continues to struggle with balance and foot pain since having Covid-19 infection. She walks with her cane.    Review of Systems  Musculoskeletal:  Positive for arthralgias.  Skin:  Positive for color change and wound.  Neurological:  Negative for dizziness and headaches.         Past Medical History:  Diagnosis Date   Acute cystitis 05/23/2018   Acute deep vein thrombosis (DVT) of right tibial vein (HCC) 04/03/2020   Anxiety    Arthritis    Bronchitis 06/17/2015   Cellulitis 12/13/2018   Chickenpox    COVID-19 virus infection 06/24/2022   Diabetes mellitus without complication (HCC)    GERD (gastroesophageal reflux disease)    Hypertension    Osteoporosis    Type 2 diabetes mellitus (HCC)    Urinary tract infection     Social  History   Socioeconomic History   Marital status: Widowed    Spouse name: Not on file   Number of children: Not on file   Years of education: Not on file   Highest education level: 12th grade  Occupational History   Not on file  Tobacco Use   Smoking status: Never   Smokeless tobacco: Never  Vaping Use   Vaping Use: Never used  Substance and Sexual Activity   Alcohol use: No   Drug use: No   Sexual activity: Not Currently  Other Topics Concern   Not on file  Social History Narrative   ** Merged History Encounter **       Widowed. Moved from [place]. 1 son, 3 grandchildren. Retired. Once worked as a Futures trader.    Social Determinants of Health   Financial Resource Strain: Low Risk  (12/09/2022)   Overall Financial Resource Strain (CARDIA)    Difficulty of Paying Living Expenses: Not hard at all  Food Insecurity: No Food Insecurity (12/09/2022)   Hunger Vital Sign    Worried About Running Out of Food in the Last Year: Never true    Ran Out of Food in the Last Year: Never true  Transportation Needs: No Transportation Needs (12/09/2022)   PRAPARE - Administrator, Civil Service (Medical): No    Lack of Transportation (Non-Medical): No  Physical Activity: Insufficiently Active (04/13/2022)  Exercise Vital Sign    Days of Exercise per Week: 2 days    Minutes of Exercise per Session: 20 min  Stress: No Stress Concern Present (04/13/2022)   [employer] of Occupational Health - Occupational Stress Questionnaire    Feeling of Stress : Not at all  Social Connections: Moderately Isolated (12/09/2022)   Social Connection and Isolation Panel [NHANES]    Frequency of Communication with Friends and Family: More than three times a week    Frequency of Social Gatherings with Friends and Family: More than three times a week    Attends Religious Services: More than 4 times per year    Active Member of [employer] or Organizations: No    Attends Banker Meetings:  Never    Marital Status: Widowed  Intimate Partner Violence: Not At Risk (04/13/2022)   Humiliation, Afraid, Rape, and Kick questionnaire    Fear of Current or Ex-Partner: No    Emotionally Abused: No    Physically Abused: No    Sexually Abused: No    Past Surgical History:  Procedure Laterality Date   ABDOMINAL HYSTERECTOMY  1984   ABDOMINAL HYSTERECTOMY     APPENDECTOMY  1958   CHOLECYSTECTOMY     GALLBLADDER SURGERY     MANDIBLE FRACTURE SURGERY  07/13/2016   TONSILLECTOMY  1958   TONSILLECTOMY     WRIST FRACTURE SURGERY Right    2000    Family History  Problem Relation Age of Onset   Hypertension Mother    Stroke Mother    Stroke Father    Hypertension Father    Hypertension Sister    Kidney disease Sister    Breast cancer Neg Hx     Allergies  Allergen Reactions   Sulfamethoxazole-Trimethoprim     Other reaction(s): Not available   Penicillins Rash    Current Outpatient Medications on File Prior to Visit  Medication Sig Dispense Refill   amLODipine (NORVASC) 10 MG tablet TAKE ONE TABLET BY MOUTH ONCE DAILY FOR BLOOD PRESSURE. 90 tablet 2   ascorbic acid (VITAMIN C) 500 MG tablet Take 500 mg by mouth daily.     benzonatate (TESSALON) 200 MG capsule Take 1 capsule (200 mg total) by mouth 3 (three) times daily as needed for cough. 15 capsule 0   Calcium-Magnesium-Vitamin D 500-250-125 MG-MG-UNIT TABS Take 1 tablet by mouth daily.     cloNIDine (CATAPRES) 0.1 MG tablet Take 1 tablet by mouth once daily as needed for systolic blood pressure greater than 170. 30 tablet 0   co-enzyme Q-10 30 MG capsule Take 30 mg by mouth 3 (three) times daily.     gabapentin (NEURONTIN) 100 MG capsule Take 1 capsule (100 mg total) by mouth at bedtime. For foot pain. 30 capsule 0   glimepiride (AMARYL) 2 MG tablet Take 1 tablet (2 mg total) by mouth 2 (two) times daily with a meal. for diabetes. 180 tablet 1   glucose blood (ONE TOUCH ULTRA TEST) test strip USE AS DIRECTED TEST 3 TIMES  DAILY E11.9 100 each 2   irbesartan (AVAPRO) 300 MG tablet Take 1 tablet (300 mg total) by mouth daily. for blood pressure. 90 tablet 2   metFORMIN (GLUCOPHAGE) 1000 MG tablet TAKE ONE TABLET BY MOUTH TWICE DAILY WITH A MEAL FOR diabetes 180 tablet 1   metoprolol succinate (TOPROL-XL) 25 MG 24 hr tablet TAKE ONE TABLET BY MOUTH ONCE DAILY FOR blood pressure. 90 tablet 2   Omega-3 Fatty Acids (FISH OIL) 300  MG CAPS Take by mouth.     ondansetron (ZOFRAN-ODT) 4 MG disintegrating tablet Take 1 tablet (4 mg total) by mouth every 8 (eight) hours as needed for nausea or vomiting. 20 tablet 0   promethazine (PHENERGAN) 12.5 MG tablet Take 1 tablet (12.5 mg total) by mouth every 8 (eight) hours as needed for nausea or vomiting. 15 tablet 0   Turmeric (QC TUMERIC COMPLEX) 500 MG CAPS Take by mouth.     Vitamin D, Ergocalciferol, (DRISDOL) 1.25 MG (50000 UNIT) CAPS capsule TAKE ONE CAPSULE BY MOUTH ONCE A WEEK 12 capsule 1   No current facility-administered medications on file prior to visit.    BP (!) 140/88   Pulse 84   Temp 98.4 F (36.9 C) (Temporal)   Ht 5\' 5"  (1.651 m)   Wt 123 lb (55.8 kg)   SpO2 95%   BMI 20.47 kg/m  Objective:   Physical Exam Cardiovascular:     Rate and Rhythm: Normal rate and regular rhythm.  Pulmonary:     Effort: Pulmonary effort is normal.     Breath sounds: Normal breath sounds.  Musculoskeletal:     Left upper arm: Tenderness present. No swelling, deformity or lacerations.     Left elbow: No swelling. Normal range of motion. No tenderness.     Right wrist: No swelling or deformity. Normal range of motion.     Left wrist: No swelling or deformity. Normal range of motion.       Arms:     Comments: Bruising to left elbow  Skin:    General: Skin is warm and dry.     Findings: Bruising present.     Comments: Nearly healed 1 cm in length laceration to upper eye. Bruising to left elbow. Healing abrasion to left patella.  Light yellow/green bruising to left  eye.   Neurological:     Mental Status: She is alert and oriented to person, place, and time.           Assessment & Plan:  Imbalance Assessment & Plan: With recent fall.  Referral placed for physical therapy.  Orders: -     Ambulatory referral to Physical Therapy  Acute extremity pain Assessment & Plan: To left forearm.  Tenderness on exam. Given history of osteoporosis, coupled with recent fall, will obtain xray of left forearm.  Await results.   Orders: -     DG Forearm Left; Future  Fall, initial encounter Assessment & Plan: One week ago.  Will obtain xrays of left forearm and thoracic back. Neuro exam benign and she has no symptoms of headaches, dizziness, etc. Will hold of on CT head for now.  Referral placed for PT to help with imbalance. Discussed to use cane daily.  Orders: -     DG Forearm Left; Future -     DG Thoracic Spine 2 View; Future        Doreene Nest, NP

## 2022-12-16 ENCOUNTER — Telehealth: Payer: Self-pay

## 2022-12-16 NOTE — Progress Notes (Signed)
Care Management & Coordination Services Pharmacy Team  Reason for Encounter: Medication Coordination and Delivery  Contacted patient to discuss medications and coordinate delivery from [facility].  Spoke with patient on 12/16/2022   Cycle dispensing form sent to Chrys Racer, PharmD for review.   Last adherence delivery date:  11/24/2022  Patient is due for next adherence delivery on: 12/23/2022  This delivery to include: Vials  30 Days   No safety caps  VIAL medications: Amlodipine 10mg -  1 tablet daily (breakfast) Metoprolol 25mg - 1 tablet daily (bedtime) Irbesartan 300mg - 1 tablet daily (bedtime) Glimepiride 2mg  - 2 tablets daily (2- lunch) Metformin 1000mg  1 tablet twice daily (1 breakfast, 1 evening meal) Vitamin D 12.50 mcg (50,000 units) - 1 tablet every Friday (breakfast)  Patient declined the following medications this month: None  Refills requested from providers include: Not complete Glimepiride 2mg  - 2 tablets daily (2- lunch) Metformin 1000mg  1 tablet twice daily (1 breakfast, 1 evening meal)  Confirmed delivery date of 12/23/2022, advised patient that pharmacy will contact them the morning of delivery.   Any concerns about your medications? No  How often do you forget or accidentally miss a dose? Never  Do you use a pillbox? Yes  Is patient in packaging No  Recent blood pressure readings are as follows: Patient did not have any readings at this time.   Recent blood glucose readings are as follows: Patient did not have any readings at this time.   Chart review: Recent office visits:  12/10/22 [name], NP Imbalance Ordered: DG Forearm left and DG Thoracic Spine Referral: Physical Therapy No F/U  Recent consult visits:  11/18/22 Helane Gunther, DPM (Podiatry) Painful toenails Procedure: Debridement of mycotic and hypertrophic toenails   Hospital visits:  None in previous 6 months  Medications: Outpatient Encounter Medications as of  12/16/2022  Medication Sig   amLODipine (NORVASC) 10 MG tablet TAKE ONE TABLET BY MOUTH ONCE DAILY FOR BLOOD PRESSURE.   ascorbic acid (VITAMIN C) 500 MG tablet Take 500 mg by mouth daily.   benzonatate (TESSALON) 200 MG capsule Take 1 capsule (200 mg total) by mouth 3 (three) times daily as needed for cough.   Calcium-Magnesium-Vitamin D 500-250-125 MG-MG-UNIT TABS Take 1 tablet by mouth daily.   cloNIDine (CATAPRES) 0.1 MG tablet Take 1 tablet by mouth once daily as needed for systolic blood pressure greater than 170.   co-enzyme Q-10 30 MG capsule Take 30 mg by mouth 3 (three) times daily.   gabapentin (NEURONTIN) 100 MG capsule Take 1 capsule (100 mg total) by mouth at bedtime. For foot pain.   glimepiride (AMARYL) 2 MG tablet Take 1 tablet (2 mg total) by mouth 2 (two) times daily with a meal. for diabetes.   glucose blood (ONE TOUCH ULTRA TEST) test strip USE AS DIRECTED TEST 3 TIMES DAILY E11.9   irbesartan (AVAPRO) 300 MG tablet Take 1 tablet (300 mg total) by mouth daily. for blood pressure.   metFORMIN (GLUCOPHAGE) 1000 MG tablet TAKE ONE TABLET BY MOUTH TWICE DAILY WITH A MEAL FOR diabetes   metoprolol succinate (TOPROL-XL) 25 MG 24 hr tablet TAKE ONE TABLET BY MOUTH ONCE DAILY FOR blood pressure.   Omega-3 Fatty Acids (FISH OIL) 300 MG CAPS Take by mouth.   ondansetron (ZOFRAN-ODT) 4 MG disintegrating tablet Take 1 tablet (4 mg total) by mouth every 8 (eight) hours as needed for nausea or vomiting.   promethazine (PHENERGAN) 12.5 MG tablet Take 1 tablet (12.5 mg total) by mouth every  8 (eight) hours as needed for nausea or vomiting.   Turmeric (QC TUMERIC COMPLEX) 500 MG CAPS Take by mouth.   Vitamin D, Ergocalciferol, (DRISDOL) 1.25 MG (50000 UNIT) CAPS capsule TAKE ONE CAPSULE BY MOUTH ONCE A WEEK   No facility-administered encounter medications on file as of 12/16/2022.   BP Readings from Last 3 Encounters:  12/10/22 (!) 140/88  11/18/22 (!) 147/62  08/10/22 (!) 142/60    Pulse  Readings from Last 3 Encounters:  12/10/22 84  11/18/22 89  08/10/22 80    Lab Results  Component Value Date/Time   HGBA1C 7.8 (A) 08/10/2022 09:16 AM   HGBA1C 8.1 (H) 04/26/2022 11:47 AM   HGBA1C 7.4 (A) 10/13/2021 09:29 AM   HGBA1C 8.2 (H) 04/15/2021 08:19 AM   Lab Results  Component Value Date   CREATININE 1.13 01/15/2022   BUN 21 01/15/2022   GFR 44.86 (L) 01/15/2022   GFRNONAA 39 (L) 01/11/2022   GFRAA >60 04/13/2020   NA 136 01/15/2022   K 4.8 01/15/2022   CALCIUM 9.9 01/15/2022   CO2 25 01/15/2022   Al Corpus, CPP notified   Claudina Lick, [place] Clinical Pharmacy Assistant [phone]

## 2022-12-17 DIAGNOSIS — M5416 Radiculopathy, lumbar region: Secondary | ICD-10-CM | POA: Diagnosis not present

## 2022-12-22 ENCOUNTER — Other Ambulatory Visit: Payer: Self-pay | Admitting: Primary Care

## 2022-12-22 DIAGNOSIS — E119 Type 2 diabetes mellitus without complications: Secondary | ICD-10-CM

## 2023-01-05 ENCOUNTER — Encounter: Payer: Self-pay | Admitting: Primary Care

## 2023-01-05 ENCOUNTER — Ambulatory Visit (INDEPENDENT_AMBULATORY_CARE_PROVIDER_SITE_OTHER): Payer: Medicare Other | Admitting: Primary Care

## 2023-01-05 ENCOUNTER — Ambulatory Visit (INDEPENDENT_AMBULATORY_CARE_PROVIDER_SITE_OTHER)
Admission: RE | Admit: 2023-01-05 | Discharge: 2023-01-05 | Disposition: A | Discharge status: Home / Self Care | Payer: Medicare Other | Source: Ambulatory Visit | Attending: Primary Care | Admitting: Primary Care

## 2023-01-05 VITALS — BP 148/70 | HR 70 | Temp 97.3°F | Ht 65.0 in | Wt 123.0 lb

## 2023-01-05 DIAGNOSIS — M79605 Pain in left leg: Secondary | ICD-10-CM

## 2023-01-05 DIAGNOSIS — M25562 Pain in left knee: Secondary | ICD-10-CM | POA: Diagnosis not present

## 2023-01-05 NOTE — Patient Instructions (Signed)
Complete xray(s) prior to leaving today. I will notify you of your results once received.  Start the prednisone prescription as prescribed.  Please call physical therapy to set up an appointment.  It was a pleasure to see you today!

## 2023-01-05 NOTE — Assessment & Plan Note (Signed)
Acute on chronic, aggravated by recent fall.  Checking plain films of the left knee today. Discussed to start prednisone that was prescribed by orthopedics.  Await results.

## 2023-01-05 NOTE — Assessment & Plan Note (Addendum)
Post traumatic and likely nerve involvement from impact.  No evidence of DVT.  No alarm signs.  Discussed to start prednisone as prescribed by orthopedics. Checking plain films of the left knee today.  She will also call physical therapy to set up an appointment as referred to several weeks ago.

## 2023-01-05 NOTE — Progress Notes (Signed)
Subjective:    Patient ID: Morgan Bowen, female    DOB: November 25, 1935, 87 y.o.   MRN: [id number]  Leg Pain  Associated symptoms include numbness.    Morgan Bowen is a very pleasant 87 y.o. female with a history of osteoporosis, nontraumatic compression fracture of lumbar vertebrae, hypertension, type 2 diabetes, calf pain, upper extremity pain, chronic knee pain who presents today to discuss lower extremity pain.  Symptom onset 3 weeks ago after a fall.  Evaluated by me on 12/10/2022 after a fall while losing her footing outside of a restaurant.  She was ambulatory within a few minutes.  During her visit on 12/10/2022 she underwent x-rays of the left upper extremity and thoracic back which were negative for acute process.  Referral was placed to PT for help with imbalance.  Today she discusses left lower extremity pain that began about one week after her prior visit. Her pain is located to the left ankle with radiation of pain up to her left hip. Also with acute on chronic knee pain. Her pain is constant. She is ambulatory but is limping. She's also noticed bruising to her lower extremity. She's tried home exercises and walking without improvement. She's been taking Advil daily with temporary improvement.  She did fall onto her left knee originally three weeks ago. She has noticed intermittent tingling to her lower extremity.   One week ago she went to [facility] for evaluation of left lower extremity pain. She was afraid she had a blood clot. She did not undergo an xray to her knee. She was prescribed prednisone at [facility] at this visit but has not begun the prescription yet.   She denies calf swelling, knee swelling, erythema.   Review of Systems  Musculoskeletal:  Positive for arthralgias. Negative for joint swelling.  Skin:  Negative for color change.  Neurological:  Positive for numbness. Negative for headaches.         Past Medical History:  Diagnosis Date   Acute cystitis  05/23/2018   Acute deep vein thrombosis (DVT) of right tibial vein (HCC) 04/03/2020   Anxiety    Arthritis    Bronchitis 06/17/2015   Cellulitis 12/13/2018   Chickenpox    COVID-19 virus infection 06/24/2022   Diabetes mellitus without complication (HCC)    GERD (gastroesophageal reflux disease)    Hypertension    Osteoporosis    Type 2 diabetes mellitus (HCC)    Urinary tract infection     Social History   Socioeconomic History   Marital status: Widowed    Spouse name: Not on file   Number of children: Not on file   Years of education: Not on file   Highest education level: 12th grade  Occupational History   Not on file  Tobacco Use   Smoking status: Never   Smokeless tobacco: Never  Vaping Use   Vaping Use: Never used  Substance and Sexual Activity   Alcohol use: No   Drug use: No   Sexual activity: Not Currently  Other Topics Concern   Not on file  Social History Narrative   ** Merged History Encounter **       Widowed. Moved from [place]. 1 son, 3 grandchildren. Retired. Once worked as a Futures trader.    Social Determinants of Health   Financial Resource Strain: Low Risk  (12/09/2022)   Overall Financial Resource Strain (CARDIA)    Difficulty of Paying Living Expenses: Not hard at all  Food Insecurity: No  Food Insecurity (12/09/2022)   Hunger Vital Sign    Worried About Running Out of Food in the Last Year: Never true    Ran Out of Food in the Last Year: Never true  Transportation Needs: No Transportation Needs (12/09/2022)   PRAPARE - Administrator, Civil Service (Medical): No    Lack of Transportation (Non-Medical): No  Physical Activity: Insufficiently Active (04/13/2022)   Exercise Vital Sign    Days of Exercise per Week: 2 days    Minutes of Exercise per Session: 20 min  Stress: No Stress Concern Present (04/13/2022)   [employer] of Occupational Health - Occupational Stress Questionnaire    Feeling of Stress : Not at all  Social  Connections: Moderately Isolated (12/09/2022)   Social Connection and Isolation Panel [NHANES]    Frequency of Communication with Friends and Family: More than three times a week    Frequency of Social Gatherings with Friends and Family: More than three times a week    Attends Religious Services: More than 4 times per year    Active Member of [employer] or Organizations: No    Attends Banker Meetings: Never    Marital Status: Widowed  Intimate Partner Violence: Not At Risk (04/13/2022)   Humiliation, Afraid, Rape, and Kick questionnaire    Fear of Current or Ex-Partner: No    Emotionally Abused: No    Physically Abused: No    Sexually Abused: No    Past Surgical History:  Procedure Laterality Date   ABDOMINAL HYSTERECTOMY  1984   ABDOMINAL HYSTERECTOMY     APPENDECTOMY  1958   CHOLECYSTECTOMY     GALLBLADDER SURGERY     MANDIBLE FRACTURE SURGERY  07/13/2016   TONSILLECTOMY  1958   TONSILLECTOMY     WRIST FRACTURE SURGERY Right    2000    Family History  Problem Relation Age of Onset   Hypertension Mother    Stroke Mother    Stroke Father    Hypertension Father    Hypertension Sister    Kidney disease Sister    Breast cancer Neg Hx     Allergies  Allergen Reactions   Sulfamethoxazole-Trimethoprim     Other reaction(s): Not available   Penicillins Rash    Current Outpatient Medications on File Prior to Visit  Medication Sig Dispense Refill   amLODipine (NORVASC) 10 MG tablet TAKE ONE TABLET BY MOUTH ONCE DAILY FOR BLOOD PRESSURE. 90 tablet 2   ascorbic acid (VITAMIN C) 500 MG tablet Take 500 mg by mouth daily.     benzonatate (TESSALON) 200 MG capsule Take 1 capsule (200 mg total) by mouth 3 (three) times daily as needed for cough. 15 capsule 0   Calcium-Magnesium-Vitamin D 500-250-125 MG-MG-UNIT TABS Take 1 tablet by mouth daily.     cloNIDine (CATAPRES) 0.1 MG tablet Take 1 tablet by mouth once daily as needed for systolic blood pressure greater than 170. 30  tablet 0   co-enzyme Q-10 30 MG capsule Take 30 mg by mouth 3 (three) times daily.     glimepiride (AMARYL) 2 MG tablet TAKE ONE TABLET BY MOUTH TWICE DAILY WITH A MEAL FOR DIABETES 180 tablet 0   glucose blood (ONE TOUCH ULTRA TEST) test strip USE AS DIRECTED TEST 3 TIMES DAILY E11.9 100 each 2   irbesartan (AVAPRO) 300 MG tablet Take 1 tablet (300 mg total) by mouth daily. for blood pressure. 90 tablet 2   metFORMIN (GLUCOPHAGE) 1000 MG tablet TAKE ONE  TABLET BY MOUTH TWICE DAILY WITH A MEAL FOR DIABETES 180 tablet 0   metoprolol succinate (TOPROL-XL) 25 MG 24 hr tablet TAKE ONE TABLET BY MOUTH ONCE DAILY FOR blood pressure. 90 tablet 2   Omega-3 Fatty Acids (FISH OIL) 300 MG CAPS Take by mouth.     Turmeric (QC TUMERIC COMPLEX) 500 MG CAPS Take by mouth.     gabapentin (NEURONTIN) 100 MG capsule Take 1 capsule (100 mg total) by mouth at bedtime. For foot pain. (Patient not taking: Reported on 01/05/2023) 30 capsule 0   ondansetron (ZOFRAN-ODT) 4 MG disintegrating tablet Take 1 tablet (4 mg total) by mouth every 8 (eight) hours as needed for nausea or vomiting. (Patient not taking: Reported on 01/05/2023) 20 tablet 0   promethazine (PHENERGAN) 12.5 MG tablet Take 1 tablet (12.5 mg total) by mouth every 8 (eight) hours as needed for nausea or vomiting. 15 tablet 0   Vitamin D, Ergocalciferol, (DRISDOL) 1.25 MG (50000 UNIT) CAPS capsule TAKE ONE CAPSULE BY MOUTH ONCE A WEEK (Patient not taking: Reported on 01/05/2023) 12 capsule 1   No current facility-administered medications on file prior to visit.    BP (!) 148/70   Pulse 70   Temp (!) 97.3 F (36.3 C) (Temporal)   Ht 5\' 5"  (1.651 m)   Wt 123 lb (55.8 kg)   SpO2 99%   BMI 20.47 kg/m  Objective:   Physical Exam Constitutional:      General: She is not in acute distress. Cardiovascular:     Rate and Rhythm: Normal rate.  Pulmonary:     Effort: Pulmonary effort is normal.  Musculoskeletal:     Left knee: No swelling, deformity,  effusion, erythema, ecchymosis or bony tenderness. Normal range of motion. Tenderness present over the LCL.       Legs:     Comments: Ambulates with cane in clinic  Neurological:     Mental Status: She is alert.           Assessment & Plan:  Pain of left lower extremity Assessment & Plan: Post traumatic and likely nerve involvement from impact.  No evidence of DVT.  No alarm signs.  Discussed to start prednisone as prescribed by orthopedics. Checking plain films of the left knee today.  She will also call physical therapy to set up an appointment as referred to several weeks ago.  Orders: -     DG Knee Complete 4 Views Left  Acute pain of left knee Assessment & Plan: Acute on chronic, aggravated by recent fall.  Checking plain films of the left knee today. Discussed to start prednisone that was prescribed by orthopedics.  Await results.  Orders: -     DG Knee Complete 4 Views Left        Doreene Nest, NP

## 2023-01-13 ENCOUNTER — Telehealth: Payer: Self-pay

## 2023-01-13 NOTE — Progress Notes (Signed)
Care Management & Coordination Services Pharmacy Team  Reason for Encounter: Medication Coordination and Delivery  Contacted patient to discuss medications and coordinate delivery from [facility].  Unsuccessful outreach. Left voicemail for patient to return call.  Cycle dispensing form sent to Chrys Racer, PharmD for review.   Last adherence delivery date: 12/23/2022      Patient is due for next adherence delivery on: 01/24/2023  This delivery to include: Vials  30 Days   No safety caps  VIAL medications: Amlodipine 10mg -  1 tablet daily (breakfast) Metoprolol 25mg - 1 tablet daily (bedtime) Irbesartan 300mg - 1 tablet daily (bedtime) Glimepiride 2mg  - 2 tablets daily (2- lunch) Metformin 1000mg  1 tablet twice daily (1 breakfast, 1 evening meal) Vitamin D 12.50 mcg (50,000 units) - 1 tablet every Friday (breakfast)  No refill request needed.  Delivery scheduled for 01/24/2023. Unable to speak with patient to confirm date.    Chart review: Recent office visits:  01/05/23 Morgan Rieger, NP Leg Pain Ordered: DG Knee Start: Prednisone prescribed by Ortho  Recent consult visits:  12/17/22 Morgan Drape, PA (Ortho) Lumbar radiculopathy No other information  Hospital visits:  None in previous 6 months  Medications: Outpatient Encounter Medications as of 01/13/2023  Medication Sig   amLODipine (NORVASC) 10 MG tablet TAKE ONE TABLET BY MOUTH ONCE DAILY FOR BLOOD PRESSURE.   ascorbic acid (VITAMIN C) 500 MG tablet Take 500 mg by mouth daily.   benzonatate (TESSALON) 200 MG capsule Take 1 capsule (200 mg total) by mouth 3 (three) times daily as needed for cough.   Calcium-Magnesium-Vitamin D 500-250-125 MG-MG-UNIT TABS Take 1 tablet by mouth daily.   cloNIDine (CATAPRES) 0.1 MG tablet Take 1 tablet by mouth once daily as needed for systolic blood pressure greater than 170.   co-enzyme Q-10 30 MG capsule Take 30 mg by mouth 3 (three) times daily.   gabapentin (NEURONTIN)  100 MG capsule Take 1 capsule (100 mg total) by mouth at bedtime. For foot pain. (Patient not taking: Reported on 01/05/2023)   glimepiride (AMARYL) 2 MG tablet TAKE ONE TABLET BY MOUTH TWICE DAILY WITH A MEAL FOR DIABETES   glucose blood (ONE TOUCH ULTRA TEST) test strip USE AS DIRECTED TEST 3 TIMES DAILY E11.9   irbesartan (AVAPRO) 300 MG tablet Take 1 tablet (300 mg total) by mouth daily. for blood pressure.   metFORMIN (GLUCOPHAGE) 1000 MG tablet TAKE ONE TABLET BY MOUTH TWICE DAILY WITH A MEAL FOR DIABETES   metoprolol succinate (TOPROL-XL) 25 MG 24 hr tablet TAKE ONE TABLET BY MOUTH ONCE DAILY FOR blood pressure.   Omega-3 Fatty Acids (FISH OIL) 300 MG CAPS Take by mouth.   ondansetron (ZOFRAN-ODT) 4 MG disintegrating tablet Take 1 tablet (4 mg total) by mouth every 8 (eight) hours as needed for nausea or vomiting. (Patient not taking: Reported on 01/05/2023)   promethazine (PHENERGAN) 12.5 MG tablet Take 1 tablet (12.5 mg total) by mouth every 8 (eight) hours as needed for nausea or vomiting.   Turmeric (QC TUMERIC COMPLEX) 500 MG CAPS Take by mouth.   Vitamin D, Ergocalciferol, (DRISDOL) 1.25 MG (50000 UNIT) CAPS capsule TAKE ONE CAPSULE BY MOUTH ONCE A WEEK (Patient not taking: Reported on 01/05/2023)   No facility-administered encounter medications on file as of 01/13/2023.   BP Readings from Last 3 Encounters:  01/05/23 (!) 148/70  12/10/22 (!) 140/88  11/18/22 (!) 147/62    Pulse Readings from Last 3 Encounters:  01/05/23 70  12/10/22 84  11/18/22 89  Lab Results  Component Value Date/Time   HGBA1C 7.8 (A) 08/10/2022 09:16 AM   HGBA1C 8.1 (H) 04/26/2022 11:47 AM   HGBA1C 7.4 (A) 10/13/2021 09:29 AM   HGBA1C 8.2 (H) 04/15/2021 08:19 AM   Lab Results  Component Value Date   CREATININE 1.13 01/15/2022   BUN 21 01/15/2022   GFR 44.86 (L) 01/15/2022   GFRNONAA 39 (L) 01/11/2022   GFRAA >60 04/13/2020   NA 136 01/15/2022   K 4.8 01/15/2022   CALCIUM 9.9 01/15/2022   CO2 25  01/15/2022   Al Corpus, CPP notified   Claudina Lick, [place] Clinical Pharmacy Assistant [phone]

## 2023-01-24 DIAGNOSIS — R2681 Unsteadiness on feet: Secondary | ICD-10-CM | POA: Diagnosis not present

## 2023-01-24 DIAGNOSIS — M6281 Muscle weakness (generalized): Secondary | ICD-10-CM | POA: Diagnosis not present

## 2023-01-27 DIAGNOSIS — M6281 Muscle weakness (generalized): Secondary | ICD-10-CM | POA: Diagnosis not present

## 2023-01-27 DIAGNOSIS — R2681 Unsteadiness on feet: Secondary | ICD-10-CM | POA: Diagnosis not present

## 2023-02-01 DIAGNOSIS — R2681 Unsteadiness on feet: Secondary | ICD-10-CM | POA: Diagnosis not present

## 2023-02-01 DIAGNOSIS — M6281 Muscle weakness (generalized): Secondary | ICD-10-CM | POA: Diagnosis not present

## 2023-02-03 DIAGNOSIS — M6281 Muscle weakness (generalized): Secondary | ICD-10-CM | POA: Diagnosis not present

## 2023-02-03 DIAGNOSIS — R2681 Unsteadiness on feet: Secondary | ICD-10-CM | POA: Diagnosis not present

## 2023-02-08 ENCOUNTER — Encounter: Payer: Self-pay | Admitting: Primary Care

## 2023-02-08 ENCOUNTER — Ambulatory Visit (INDEPENDENT_AMBULATORY_CARE_PROVIDER_SITE_OTHER): Payer: Medicare Other | Admitting: Primary Care

## 2023-02-08 VITALS — BP 136/68 | HR 82 | Temp 97.2°F | Ht 65.0 in | Wt 121.0 lb

## 2023-02-08 DIAGNOSIS — M810 Age-related osteoporosis without current pathological fracture: Secondary | ICD-10-CM

## 2023-02-08 DIAGNOSIS — F32A Depression, unspecified: Secondary | ICD-10-CM

## 2023-02-08 DIAGNOSIS — R2681 Unsteadiness on feet: Secondary | ICD-10-CM | POA: Diagnosis not present

## 2023-02-08 DIAGNOSIS — Z7984 Long term (current) use of oral hypoglycemic drugs: Secondary | ICD-10-CM

## 2023-02-08 DIAGNOSIS — M6281 Muscle weakness (generalized): Secondary | ICD-10-CM | POA: Diagnosis not present

## 2023-02-08 DIAGNOSIS — G8929 Other chronic pain: Secondary | ICD-10-CM | POA: Diagnosis not present

## 2023-02-08 DIAGNOSIS — R2689 Other abnormalities of gait and mobility: Secondary | ICD-10-CM

## 2023-02-08 DIAGNOSIS — M79673 Pain in unspecified foot: Secondary | ICD-10-CM | POA: Diagnosis not present

## 2023-02-08 DIAGNOSIS — I10 Essential (primary) hypertension: Secondary | ICD-10-CM | POA: Diagnosis not present

## 2023-02-08 DIAGNOSIS — F419 Anxiety disorder, unspecified: Secondary | ICD-10-CM

## 2023-02-08 DIAGNOSIS — E559 Vitamin D deficiency, unspecified: Secondary | ICD-10-CM

## 2023-02-08 DIAGNOSIS — E1169 Type 2 diabetes mellitus with other specified complication: Secondary | ICD-10-CM | POA: Diagnosis not present

## 2023-02-08 LAB — POCT GLYCOSYLATED HEMOGLOBIN (HGB A1C): Hemoglobin A1C: 8.2 % — AB (ref 4.0–5.6)

## 2023-02-08 NOTE — Assessment & Plan Note (Signed)
Worse with A1C today of A1C of 8.2 today. Given her frailty we will continue with glimepiride 2 mg BID, metformin 1000 mg BID.  She will work on her diet, increase physical activity. Foot exam today.  Close follow-up in 3 months.

## 2023-02-08 NOTE — Assessment & Plan Note (Signed)
Controlled.  Continue to monitor.  

## 2023-02-08 NOTE — Assessment & Plan Note (Signed)
Improving!  Continue with PT.

## 2023-02-08 NOTE — Assessment & Plan Note (Signed)
Controlled.  Continue metoprolol succinate 25 mg daily, amlodipine 10 mg daily, irbesartan 300 mg daily. CMP pending.

## 2023-02-08 NOTE — Assessment & Plan Note (Signed)
Will try to repeat bone density scan in November 2024.  If insurance will not cover, repeat in November 2025.  Continue calcium daily. Continue vitamin D 50,000 IU capsules once weekly.

## 2023-02-08 NOTE — Patient Instructions (Signed)
Work on [employer] as discussed.  Continue all diabetes medications as prescribed.  Schedule a 30-month follow-up visit upfront for diabetes check.  It was a pleasure to see you today!

## 2023-02-08 NOTE — Progress Notes (Signed)
Subjective:    Patient ID: Morgan Bowen, female    DOB: [birth year], 87 y.o.   MRN: [id number]  HPI  Morgan Bowen is a very pleasant 87 y.o. female with a history of hypertension, type 2 diabetes, osteoporosis, anxiety and depression, imbalance who presents today for follow-up of chronic conditions.  1) Hypertension: Currently managed on amlodipine 10 mg daily, metoprolol succinate 25 mg daily, irbesartan 300 mg daily.  She is also prescribed clonidine 0.1 mg tablet to use as needed.  She denies dizziness, chest pain, shortness of breath.   BP Readings from Last 3 Encounters:  02/08/23 136/68  01/05/23 (!) 148/70  12/10/22 (!) 140/88     2) Type 2 Diabetes:  Current medications include: Metformin 1000 mg twice daily, glimepiride 2 mg twice daily  She is checking her blood glucose every other day and is getting readings of:  AM fasting: 120's to 135  Last A1C: 7.8 in January 2024, 8.2 today  Last Eye Exam: Up-to-date Last Foot Exam: Due Pneumonia Vaccination: 2019 Urine Microalbumin: Up-to-date Statin: Intolerant  Dietary changes since last visit: None. Decreased appetite.    Exercise: Physical therapy  3) Osteoporosis: Chronic.  Last bone density scan was in November 2023, T-score of -3.3 to left forearm, T-score of -3.12 to left hip.  She is compliant to calcium and vitamin D daily.  She has declined bisphosphonate treatment or any other prescription treatment for osteoporosis previously.    Review of Systems  Respiratory:  Negative for shortness of breath.   Cardiovascular:  Negative for chest pain.  Gastrointestinal:  Negative for constipation and diarrhea.  Neurological:  Negative for dizziness and headaches.  Psychiatric/Behavioral:  The patient is not nervous/anxious.          Past Medical History:  Diagnosis Date   Acute cystitis 05/23/2018   Acute deep vein thrombosis (DVT) of right tibial vein (HCC) 04/03/2020   Anxiety    Arthritis    Bronchitis  06/17/2015   Cellulitis 12/13/2018   Chickenpox    COVID-19 virus infection 06/24/2022   Diabetes mellitus without complication [facility])    Dizziness 01/15/2022   Fall 12/10/2022   GERD (gastroesophageal reflux disease)    Hypertension    Osteoporosis    Type 2 diabetes mellitus (HCC)    Urinary tract infection     Social History   Socioeconomic History   Marital status: Widowed    Spouse name: Not on file   Number of children: Not on file   Years of education: Not on file   Highest education level: 12th grade  Occupational History   Not on file  Tobacco Use   Smoking status: Never   Smokeless tobacco: Never  Vaping Use   Vaping Use: Never used  Substance and Sexual Activity   Alcohol use: No   Drug use: No   Sexual activity: Not Currently  Other Topics Concern   Not on file  Social History Narrative   ** Merged History Encounter **       Widowed. Moved from [place]. 1 son, 3 grandchildren. Retired. Once worked as a Futures trader.    Social Determinants of Health   Financial Resource Strain: Low Risk  (12/09/2022)   Overall Financial Resource Strain (CARDIA)    Difficulty of Paying Living Expenses: Not hard at all  Food Insecurity: No Food Insecurity (12/09/2022)   Hunger Vital Sign    Worried About Running Out of Food in the Last Year: Never true  Ran Out of Food in the Last Year: Never true  Transportation Needs: No Transportation Needs (12/09/2022)   PRAPARE - Administrator, Civil Service (Medical): No    Lack of Transportation (Non-Medical): No  Physical Activity: Insufficiently Active (04/13/2022)   Exercise Vital Sign    Days of Exercise per Week: 2 days    Minutes of Exercise per Session: 20 min  Stress: No Stress Concern Present (04/13/2022)   [employer] of Occupational Health - Occupational Stress Questionnaire    Feeling of Stress : Not at all  Social Connections: Moderately Isolated (12/09/2022)   Social Connection and Isolation  Panel [NHANES]    Frequency of Communication with Friends and Family: More than three times a week    Frequency of Social Gatherings with Friends and Family: More than three times a week    Attends Religious Services: More than 4 times per year    Active Member of [employer] or Organizations: No    Attends Banker Meetings: Never    Marital Status: Widowed  Intimate Partner Violence: Not At Risk (04/13/2022)   Humiliation, Afraid, Rape, and Kick questionnaire    Fear of Current or Ex-Partner: No    Emotionally Abused: No    Physically Abused: No    Sexually Abused: No    Past Surgical History:  Procedure Laterality Date   ABDOMINAL HYSTERECTOMY  1984   ABDOMINAL HYSTERECTOMY     APPENDECTOMY  1958   CHOLECYSTECTOMY     GALLBLADDER SURGERY     MANDIBLE FRACTURE SURGERY  07/13/2016   TONSILLECTOMY  1958   TONSILLECTOMY     WRIST FRACTURE SURGERY Right    2000    Family History  Problem Relation Age of Onset   Hypertension Mother    Stroke Mother    Stroke Father    Hypertension Father    Hypertension Sister    Kidney disease Sister    Breast cancer Neg Hx     Allergies  Allergen Reactions   Sulfamethoxazole-Trimethoprim     Other reaction(s): Not available   Penicillins Rash    Current Outpatient Medications on File Prior to Visit  Medication Sig Dispense Refill   amLODipine (NORVASC) 10 MG tablet TAKE ONE TABLET BY MOUTH ONCE DAILY FOR BLOOD PRESSURE. 90 tablet 2   ascorbic acid (VITAMIN C) 500 MG tablet Take 500 mg by mouth daily.     Calcium-Magnesium-Vitamin D 500-250-125 MG-MG-UNIT TABS Take 1 tablet by mouth daily.     cloNIDine (CATAPRES) 0.1 MG tablet Take 1 tablet by mouth once daily as needed for systolic blood pressure greater than 170. 30 tablet 0   co-enzyme Q-10 30 MG capsule Take 30 mg by mouth 3 (three) times daily.     glimepiride (AMARYL) 2 MG tablet TAKE ONE TABLET BY MOUTH TWICE DAILY WITH A MEAL FOR DIABETES 180 tablet 0   glucose blood  (ONE TOUCH ULTRA TEST) test strip USE AS DIRECTED TEST 3 TIMES DAILY E11.9 100 each 2   irbesartan (AVAPRO) 300 MG tablet Take 1 tablet (300 mg total) by mouth daily. for blood pressure. 90 tablet 2   metFORMIN (GLUCOPHAGE) 1000 MG tablet TAKE ONE TABLET BY MOUTH TWICE DAILY WITH A MEAL FOR DIABETES 180 tablet 0   metoprolol succinate (TOPROL-XL) 25 MG 24 hr tablet TAKE ONE TABLET BY MOUTH ONCE DAILY FOR blood pressure. 90 tablet 2   Omega-3 Fatty Acids (FISH OIL) 300 MG CAPS Take by mouth.  Turmeric (QC TUMERIC COMPLEX) 500 MG CAPS Take by mouth.     Vitamin D, Ergocalciferol, (DRISDOL) 1.25 MG (50000 UNIT) CAPS capsule TAKE ONE CAPSULE BY MOUTH ONCE A WEEK (Patient not taking: Reported on 01/05/2023) 12 capsule 1   No current facility-administered medications on file prior to visit.    BP 136/68   Pulse 82   Temp (!) 97.2 F (36.2 C) (Temporal)   Ht 5\' 5"  (1.651 m)   Wt 121 lb (54.9 kg)   SpO2 98%   BMI 20.14 kg/m  Objective:   Physical Exam Cardiovascular:     Rate and Rhythm: Normal rate and regular rhythm.  Pulmonary:     Effort: Pulmonary effort is normal.     Breath sounds: Normal breath sounds.  Musculoskeletal:     Cervical back: Neck supple.  Skin:    General: Skin is warm and dry.  Neurological:     Mental Status: She is alert and oriented to person, place, and time.  Psychiatric:        Mood and Affect: Mood normal.           Assessment & Plan:  Type 2 diabetes mellitus with other specified complication, without long-term current use of insulin (HCC) Assessment & Plan: Worse with A1C today of A1C of 8.2 today. Given her frailty we will continue with glimepiride 2 mg BID, metformin 1000 mg BID.  She will work on her diet, increase physical activity. Foot exam today.  Close follow-up in 3 months.   Orders: -     POCT glycosylated hemoglobin (Hb A1C)  Essential hypertension Assessment & Plan: Controlled.  Continue metoprolol succinate 25 mg  daily, amlodipine 10 mg daily, irbesartan 300 mg daily. CMP pending.   Osteoporosis without current pathological fracture, unspecified osteoporosis type Assessment & Plan: Will try to repeat bone density scan in November 2024.  If insurance will not cover, repeat in November 2025.  Continue calcium daily. Continue vitamin D 50,000 IU capsules once weekly.   Anxiety and depression Assessment & Plan: Controlled.  Continue to monitor.   Chronic foot pain, unspecified laterality Assessment & Plan: Stable.  Remain off of gabapentin.   Imbalance Assessment & Plan: Improving!  Continue with PT.   Vitamin D deficiency Assessment & Plan: Continue vitamin D 50,000 IU capsules weekly. Repeat vitamin D level next visit.         Doreene Nest, NP

## 2023-02-08 NOTE — Assessment & Plan Note (Signed)
Stable.  Remain off of gabapentin.

## 2023-02-08 NOTE — Assessment & Plan Note (Signed)
Continue vitamin D 50,000 IU capsules weekly. Repeat vitamin D level next visit.

## 2023-02-15 DIAGNOSIS — H2513 Age-related nuclear cataract, bilateral: Secondary | ICD-10-CM | POA: Diagnosis not present

## 2023-02-15 DIAGNOSIS — H2511 Age-related nuclear cataract, right eye: Secondary | ICD-10-CM | POA: Diagnosis not present

## 2023-02-15 DIAGNOSIS — H25043 Posterior subcapsular polar age-related cataract, bilateral: Secondary | ICD-10-CM | POA: Diagnosis not present

## 2023-02-15 DIAGNOSIS — H18413 Arcus senilis, bilateral: Secondary | ICD-10-CM | POA: Diagnosis not present

## 2023-02-15 DIAGNOSIS — H353131 Nonexudative age-related macular degeneration, bilateral, early dry stage: Secondary | ICD-10-CM | POA: Diagnosis not present

## 2023-02-24 ENCOUNTER — Ambulatory Visit (INDEPENDENT_AMBULATORY_CARE_PROVIDER_SITE_OTHER): Payer: Medicare Other | Admitting: Podiatry

## 2023-02-24 VITALS — BP 156/65

## 2023-02-24 DIAGNOSIS — B351 Tinea unguium: Secondary | ICD-10-CM

## 2023-02-24 DIAGNOSIS — M79674 Pain in right toe(s): Secondary | ICD-10-CM

## 2023-02-24 DIAGNOSIS — E119 Type 2 diabetes mellitus without complications: Secondary | ICD-10-CM

## 2023-02-24 DIAGNOSIS — M79675 Pain in left toe(s): Secondary | ICD-10-CM | POA: Diagnosis not present

## 2023-02-24 DIAGNOSIS — D689 Coagulation defect, unspecified: Secondary | ICD-10-CM

## 2023-02-24 NOTE — Progress Notes (Unsigned)
  Subjective:  Patient ID: Morgan Bowen, female    DOB: 1935-11-08,  MRN: [id number]  Morgan Bowen presents to clinic today for: {jgcomplaint:23593}  Chief Complaint  Patient presents with   Nail Problem    [facility],    PCP is Doreene Nest, NP.  Allergies  Allergen Reactions   Sulfamethoxazole-Trimethoprim     Other reaction(s): Not available   Penicillins Rash    Review of Systems: Negative except as noted in the HPI.  Objective: No changes noted in today's physical examination. There were no vitals filed for this visit.  Morgan Bowen is a pleasant 87 y.o. female in NAD. AAO x 3.  Vascular Examination: Capillary refill time <3 seconds b/l LE. Palpable pedal pulses b/l LE. Digital hair present b/l. No pedal edema b/l. Skin temperature gradient WNL b/l. No varicosities b/l. {jgvascular:23595}.  Dermatological Examination: Pedal skin with normal turgor, texture and tone b/l. No open wounds. No interdigital macerations b/l. Toenails 1-5 b/l thickened, discolored, dystrophic with subungual debris. There is pain on palpation to dorsal aspect of nailplates. {jgderm:23598}.  Neurological Examination: Protective sensation intact with 10 gram monofilament b/l LE. Vibratory sensation intact b/l LE. {jgneuro:23601::"Protective sensation intact 5/5 intact bilaterally with 10g monofilament b/l.","Vibratory sensation intact b/l.","Proprioception intact bilaterally."}  Musculoskeletal Examination: {jgmsk:23600}     Latest Ref Rng & Units 02/08/2023    8:03 AM 08/10/2022    9:16 AM 04/26/2022   11:47 AM  Hemoglobin A1C  Hemoglobin-A1c 4.0 - 5.6 % 8.2  7.8  8.1    Assessment/Plan: 1. Pain due to onychomycosis of toenails of both feet   2. Blood coagulation defect (HCC)   3. Type 2 diabetes mellitus without complication, without long-term current use of insulin (HCC)     No orders of the defined types were placed in this encounter.  None {Jgplan:23602::"-Patient/POA to call should there  be question/concern in the interim."}   Return in about 3 months (around 05/27/2023).  Freddie Breech, DPM

## 2023-03-01 DIAGNOSIS — R2681 Unsteadiness on feet: Secondary | ICD-10-CM | POA: Diagnosis not present

## 2023-03-01 DIAGNOSIS — M6281 Muscle weakness (generalized): Secondary | ICD-10-CM | POA: Diagnosis not present

## 2023-03-03 ENCOUNTER — Encounter: Payer: Self-pay | Admitting: Podiatry

## 2023-03-07 DIAGNOSIS — R2681 Unsteadiness on feet: Secondary | ICD-10-CM | POA: Diagnosis not present

## 2023-03-07 DIAGNOSIS — M6281 Muscle weakness (generalized): Secondary | ICD-10-CM | POA: Diagnosis not present

## 2023-03-09 DIAGNOSIS — M6281 Muscle weakness (generalized): Secondary | ICD-10-CM | POA: Diagnosis not present

## 2023-03-09 DIAGNOSIS — R2681 Unsteadiness on feet: Secondary | ICD-10-CM | POA: Diagnosis not present

## 2023-03-10 ENCOUNTER — Other Ambulatory Visit: Payer: Self-pay | Admitting: Primary Care

## 2023-03-10 DIAGNOSIS — M810 Age-related osteoporosis without current pathological fracture: Secondary | ICD-10-CM

## 2023-03-10 DIAGNOSIS — I10 Essential (primary) hypertension: Secondary | ICD-10-CM

## 2023-03-18 ENCOUNTER — Other Ambulatory Visit: Payer: Self-pay | Admitting: Primary Care

## 2023-03-18 DIAGNOSIS — I10 Essential (primary) hypertension: Secondary | ICD-10-CM

## 2023-03-18 DIAGNOSIS — M810 Age-related osteoporosis without current pathological fracture: Secondary | ICD-10-CM

## 2023-03-18 DIAGNOSIS — E119 Type 2 diabetes mellitus without complications: Secondary | ICD-10-CM

## 2023-03-22 DIAGNOSIS — M6281 Muscle weakness (generalized): Secondary | ICD-10-CM | POA: Diagnosis not present

## 2023-03-22 DIAGNOSIS — R2681 Unsteadiness on feet: Secondary | ICD-10-CM | POA: Diagnosis not present

## 2023-03-24 ENCOUNTER — Encounter (INDEPENDENT_AMBULATORY_CARE_PROVIDER_SITE_OTHER): Payer: Self-pay

## 2023-03-24 DIAGNOSIS — M6281 Muscle weakness (generalized): Secondary | ICD-10-CM | POA: Diagnosis not present

## 2023-03-24 DIAGNOSIS — R2681 Unsteadiness on feet: Secondary | ICD-10-CM | POA: Diagnosis not present

## 2023-04-05 DIAGNOSIS — R2681 Unsteadiness on feet: Secondary | ICD-10-CM | POA: Diagnosis not present

## 2023-04-05 DIAGNOSIS — M6281 Muscle weakness (generalized): Secondary | ICD-10-CM | POA: Diagnosis not present

## 2023-04-08 DIAGNOSIS — M6281 Muscle weakness (generalized): Secondary | ICD-10-CM | POA: Diagnosis not present

## 2023-04-08 DIAGNOSIS — R2681 Unsteadiness on feet: Secondary | ICD-10-CM | POA: Diagnosis not present

## 2023-04-12 DIAGNOSIS — R2681 Unsteadiness on feet: Secondary | ICD-10-CM | POA: Diagnosis not present

## 2023-04-12 DIAGNOSIS — M6281 Muscle weakness (generalized): Secondary | ICD-10-CM | POA: Diagnosis not present

## 2023-04-14 DIAGNOSIS — M6281 Muscle weakness (generalized): Secondary | ICD-10-CM | POA: Diagnosis not present

## 2023-04-14 DIAGNOSIS — R2681 Unsteadiness on feet: Secondary | ICD-10-CM | POA: Diagnosis not present

## 2023-04-18 ENCOUNTER — Telehealth: Payer: Self-pay | Admitting: Primary Care

## 2023-04-18 DIAGNOSIS — M6281 Muscle weakness (generalized): Secondary | ICD-10-CM | POA: Diagnosis not present

## 2023-04-18 DIAGNOSIS — E119 Type 2 diabetes mellitus without complications: Secondary | ICD-10-CM

## 2023-04-18 DIAGNOSIS — R2681 Unsteadiness on feet: Secondary | ICD-10-CM | POA: Diagnosis not present

## 2023-04-18 MED ORDER — GLIMEPIRIDE 2 MG PO TABS: 2.0000 mg | ORAL_TABLET | Freq: Two times a day (BID) | ORAL | 1 refills | Status: DC

## 2023-04-18 NOTE — Addendum Note (Signed)
Addended by: Doreene Nest on: 04/18/2023 05:32 PM   Modules accepted: Orders

## 2023-04-18 NOTE — Telephone Encounter (Signed)
Refills sent to pharmacy. 

## 2023-04-18 NOTE — Telephone Encounter (Signed)
Prescription Request  04/18/2023  LOV: 02/08/2023  What is the name of the medication or equipment? glimepiride (AMARYL) 2 MG tablet   Have you contacted your pharmacy to request a refill? No   Which pharmacy would you like this sent to?   [id number] [place], [place] - [address] [place] [address] Phone: [phone] Fax: [phone]    Patient notified that their request is being sent to the clinical staff for review and that they should receive a response within 2 business days.   Please advise at Mobile [phone] (mobile)

## 2023-04-20 DIAGNOSIS — R2681 Unsteadiness on feet: Secondary | ICD-10-CM | POA: Diagnosis not present

## 2023-04-20 DIAGNOSIS — M6281 Muscle weakness (generalized): Secondary | ICD-10-CM | POA: Diagnosis not present

## 2023-04-25 DIAGNOSIS — H2511 Age-related nuclear cataract, right eye: Secondary | ICD-10-CM | POA: Diagnosis not present

## 2023-04-25 HISTORY — PX: CATARACT EXTRACTION: SUR2

## 2023-04-26 DIAGNOSIS — Z961 Presence of intraocular lens: Secondary | ICD-10-CM | POA: Diagnosis not present

## 2023-04-27 ENCOUNTER — Ambulatory Visit (INDEPENDENT_AMBULATORY_CARE_PROVIDER_SITE_OTHER): Payer: Medicare Other

## 2023-04-27 DIAGNOSIS — Z0000 Encounter for general adult medical examination without abnormal findings: Secondary | ICD-10-CM

## 2023-04-27 NOTE — Patient Instructions (Addendum)
Morgan Bowen , Thank you for taking time to come for your Medicare Wellness Visit. I appreciate your ongoing commitment to your health goals. Please review the following plan we discussed and let me know if I can assist you in the future.   Referrals/Orders/Follow-Ups/Clinician Recommendations: none  This is a list of the screening recommended for you and due dates:  Health Maintenance  Topic Date Due   Yearly kidney function blood test for diabetes  01/16/2023   Flu Shot  03/10/2023   Zoster (Shingles) Vaccine (1 of 2) 05/11/2023*   Yearly kidney health urinalysis for diabetes  08/11/2023   Hemoglobin A1C  08/11/2023   Eye exam for diabetics  09/07/2023   Complete foot exam   02/08/2024   Medicare Annual Wellness Visit  04/26/2024   DTaP/Tdap/Td vaccine (2 - Td or Tdap) 07/13/2025   Pneumonia Vaccine  Completed   DEXA scan (bone density measurement)  Completed   HPV Vaccine  Aged Out   COVID-19 Vaccine  Discontinued  *Topic was postponed. The date shown is not the original due date.    Advanced directives: (ACP Link)Information on Advanced Care Planning can be found at [facility] Advance Health Care Directives Advance Health Care Directives ([email or web address]   Next Medicare Annual Wellness Visit scheduled for next year: Yes  Insert Preventive Care attachment Insert FALL PREVENTION attachment if needed

## 2023-04-27 NOTE — Progress Notes (Signed)
Subjective:   Morgan Bowen is a 87 y.o. female who presents for Medicare Annual (Subsequent) preventive examination.  Visit Complete: Virtual  I connected with  Kathrynn Humble on 04/27/23 by a audio enabled telemedicine application and verified that I am speaking with the correct person using two identifiers.  Patient Location: Home  Provider Location: Office/Clinic  I discussed the limitations of evaluation and management by telemedicine. The patient expressed understanding and agreed to proceed.  Vital Signs: Unable to obtain new vitals due to this being a telehealth visit.  Cardiac Risk Factors include: advanced age (>83men, >39 women);diabetes mellitus;hypertension     Objective:    Today's Vitals   There is no height or weight on file to calculate BMI.     04/27/2023    9:40 AM 04/13/2022    9:40 AM 01/11/2022    6:30 PM 05/16/2020   11:48 AM 04/13/2020    9:16 PM 04/12/2020    8:37 AM 04/11/2020   11:33 PM  Advanced Directives  Does Patient Have a Medical Advance Directive? 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    No - Guardian declined No - Patient declined No - Patient declined   Copy of Healthcare Power of Attorney in Chart?     No - copy requested No - copy requested No - copy requested  Would patient like information on creating a medical advance directive?  No - Patient declined   No - Patient declined No - Patient declined     Current Medications (verified) Outpatient Encounter Medications as of 04/27/2023  Medication Sig   amLODipine (NORVASC) 10 MG tablet TAKE ONE TABLET BY MOUTH ONCE DAILY FOR BLOOD PRESSURE   ascorbic acid (VITAMIN C) 500 MG tablet Take 500 mg by mouth daily.   Calcium-Magnesium-Vitamin D 500-250-125 MG-MG-UNIT TABS Take 1 tablet by mouth daily.   cloNIDine (CATAPRES) 0.1 MG tablet  Take 1 tablet by mouth once daily as needed for systolic blood pressure greater than 170.   co-enzyme Q-10 30 MG capsule Take 30 mg by mouth 3 (three) times daily.   glimepiride (AMARYL) 2 MG tablet Take 1 tablet (2 mg total) by mouth 2 (two) times daily with a meal. for diabetes.   glucose blood (ONE TOUCH ULTRA TEST) test strip USE AS DIRECTED TEST 3 TIMES DAILY E11.9   irbesartan (AVAPRO) 300 MG tablet TAKE ONE TABLET BY MOUTH ONCE DAILY FOR BLOOD PRESSURE   metFORMIN (GLUCOPHAGE) 1000 MG tablet TAKE ONE TABLET BY MOUTH TWICE DAILY WITH A MEAL FOR DIABETES   metoprolol succinate (TOPROL-XL) 25 MG 24 hr tablet TAKE ONE TABLET BY MOUTH ONCE DAILY FOR BLOOD PRESSURE   Omega-3 Fatty Acids (FISH OIL) 300 MG CAPS Take by mouth.   Turmeric (QC TUMERIC COMPLEX) 500 MG CAPS Take by mouth.   Vitamin D, Ergocalciferol, (DRISDOL) 1.25 MG (50000 UNIT) CAPS capsule TAKE ONE CAPSULE BY MOUTH ONCE A WEEK   No facility-administered encounter medications on file as of 04/27/2023.    Allergies (verified) Sulfamethoxazole-trimethoprim and Penicillins   History: Past Medical History:  Diagnosis Date   Acute cystitis 05/23/2018   Acute deep vein thrombosis (DVT) of right tibial vein (HCC) 04/03/2020   Anxiety    Arthritis    Bronchitis 06/17/2015   Cellulitis 12/13/2018   Chickenpox  COVID-19 virus infection 06/24/2022   Diabetes mellitus without complication (HCC)    Dizziness 01/15/2022   Fall 12/10/2022   GERD (gastroesophageal reflux disease)    Hypertension    Osteoporosis    Type 2 diabetes mellitus (HCC)    Urinary tract infection    Past Surgical History:  Procedure Laterality Date   ABDOMINAL HYSTERECTOMY  1984   ABDOMINAL HYSTERECTOMY     APPENDECTOMY  1958   CATARACT EXTRACTION Right 04/25/2023   CHOLECYSTECTOMY     GALLBLADDER SURGERY     MANDIBLE FRACTURE SURGERY  07/13/2016   TONSILLECTOMY  1958   TONSILLECTOMY     WRIST FRACTURE SURGERY Right    2000   Family History   Problem Relation Age of Onset   Hypertension Mother    Stroke Mother    Stroke Father    Hypertension Father    Hypertension Sister    Kidney disease Sister    Breast cancer Neg Hx    Social History   Socioeconomic History   Marital status: Widowed    Spouse name: Not on file   Number of children: Not on file   Years of education: Not on file   Highest education level: 12th grade  Occupational History   Not on file  Tobacco Use   Smoking status: Never   Smokeless tobacco: Never  Vaping Use   Vaping status: Never Used  Substance and Sexual Activity   Alcohol use: No   Drug use: No   Sexual activity: Not Currently  Other Topics Concern   Not on file  Social History Narrative   ** Merged History Encounter **       Widowed. Moved from [place]. 1 son, 3 grandchildren. Retired. Once worked as a Futures trader.    Social Determinants of Health   Financial Resource Strain: Low Risk  (04/27/2023)   Overall Financial Resource Strain (CARDIA)    Difficulty of Paying Living Expenses: Not hard at all  Food Insecurity: No Food Insecurity (04/27/2023)   Hunger Vital Sign    Worried About Running Out of Food in the Last Year: Never true    Ran Out of Food in the Last Year: Never true  Transportation Needs: No Transportation Needs (04/27/2023)   PRAPARE - Administrator, Civil Service (Medical): No    Lack of Transportation (Non-Medical): No  Physical Activity: Inactive (04/27/2023)   Exercise Vital Sign    Days of Exercise per Week: 0 days    Minutes of Exercise per Session: 0 min  Stress: No Stress Concern Present (04/27/2023)   [employer] of Occupational Health - Occupational Stress Questionnaire    Feeling of Stress : Not at all  Social Connections: Socially Isolated (04/27/2023)   Social Connection and Isolation Panel [NHANES]    Frequency of Communication with Friends and Family: Patient unable to answer    Frequency of Social Gatherings with Friends  and Family: More than three times a week    Attends Religious Services: Never    Database administrator or Organizations: No    Attends Banker Meetings: Never    Marital Status: Widowed    Tobacco Counseling Counseling given: Not Answered   Clinical Intake:  Pre-visit preparation completed: Yes  Pain : No/denies pain     Nutritional Risks: None Diabetes: Yes CBG done?: No Did pt. bring in CBG monitor from home?: No  How often do you need to have someone help you when you  read instructions, pamphlets, or other written materials from your doctor or pharmacy?: 1 - Never  Interpreter Needed?: No  Information entered by :: NAllen LPN   Activities of Daily Living    04/27/2023    9:35 AM  In your present state of health, do you have any difficulty performing the following activities:  Hearing? 0  Vision? 0  Difficulty concentrating or making decisions? 0  Walking or climbing stairs? 0  Dressing or bathing? 0  Doing errands, shopping? 0  Preparing Food and eating ? N  Using the Toilet? N  In the past six months, have you accidently leaked urine? N  Do you have problems with loss of bowel control? N  Managing your Medications? N  Managing your Finances? N  Housekeeping or managing your Housekeeping? N    Patient Care Team: Doreene Nest, NP as PCP - General (Internal Medicine) Chestine Spore Keane Scrape, NP (Internal Medicine) Kathyrn Sheriff, [facility] (Inactive) as Pharmacist (Pharmacist)  Indicate any recent Medical Services you may have received from other than [facility] providers in the past year (date may be approximate).     Assessment:   This is a routine wellness examination for Jardine.  Hearing/Vision screen Hearing Screening - Comments:: Denies hearing issues Vision Screening - Comments:: Regular eye exams, Dr. Senaida Ores   Goals Addressed             This Visit's Progress    Patient Stated       04/27/2023, wants to work on feet and  mobility       Depression Screen    04/27/2023    9:43 AM 02/08/2023    7:54 AM 01/05/2023    9:34 AM 04/13/2022    9:38 AM 10/13/2021    9:25 AM 04/14/2021    9:08 AM 04/01/2020    1:31 PM  PHQ 2/9 Scores  PHQ - 2 Score 0 0 0 0 0 3 0  PHQ- 9 Score 2   0 0 5 0    Fall Risk    04/27/2023    9:41 AM 02/08/2023    7:53 AM 01/05/2023    9:34 AM 04/13/2022    9:41 AM 04/09/2022    7:05 PM  Fall Risk   Falls in the past year? 1 1 1 1 1   Comment lose balance      Number falls in past yr: 1 0 0 0 1  Injury with Fall? 1 0 1 0 0  Comment bruised eye and arm      Risk for fall due to : Impaired balance/gait;Impaired mobility;Medication side effect Impaired balance/gait History of fall(s) History of fall(s)   Follow up Falls prevention discussed;Falls evaluation completed Falls evaluation completed Falls evaluation completed Falls prevention discussed     MEDICARE RISK AT HOME: Medicare Risk at Home Any stairs in or around the home?: No If so, are there any without handrails?: No Home free of loose throw rugs in walkways, pet beds, electrical cords, etc?: Yes Adequate lighting in your home to reduce risk of falls?: Yes Life alert?: No Use of a cane, walker or w/c?: Yes Grab bars in the bathroom?: No Shower chair or bench in shower?: Yes Elevated toilet seat or a handicapped toilet?: Yes  TIMED UP AND GO:  Was the test performed?  No    Cognitive Function:    04/01/2020    1:33 PM 03/27/2019   12:13 PM 03/16/2018    4:36 PM  MMSE - Mini Mental  State Exam  Orientation to time 5 5 5   Orientation to Place 5 5 5   Registration 3 3 3   Attention/ Calculation 5 5 0  Recall 3 3 3   Language- name 2 objects  0 0  Language- repeat 1 1 1   Language- follow 3 step command  0 3  Language- read & follow direction  0 0  Write a sentence  0 0  Copy design  0 0  Total score  22 20        04/27/2023    9:45 AM 04/13/2022    9:44 AM  6CIT Screen  What Year? 0 points 0 points  What month? 0 points  0 points  What time? 0 points 0 points  Count back from 20 0 points 0 points  Months in reverse 0 points 2 points  Repeat phrase 0 points 4 points  Total Score 0 points 6 points    Immunizations Immunization History  Administered Date(s) Administered   Influenza, High Dose Seasonal PF 05/09/2012   Influenza,inj,Quad PF,6+ Mos 03/30/2019   PFIZER(Purple Top)SARS-COV-2 Vaccination 04/08/2020, 04/29/2020   Pneumococcal Conjugate-13 10/24/2017   Pneumococcal Polysaccharide-23 08/23/2013   Tdap 07/14/2015   Zoster, Live 08/06/2013, 12/08/2014    TDAP status: Up to date  Flu Vaccine status: Due, Education has been provided regarding the importance of this vaccine. Advised may receive this vaccine at local pharmacy or Health Dept. Aware to provide a copy of the vaccination record if obtained from local pharmacy or Health Dept. Verbalized acceptance and understanding.  Pneumococcal vaccine status: Up to date  Covid-19 vaccine status: Information provided on how to obtain vaccines.   Qualifies for Shingles Vaccine? Yes   Zostavax completed Yes   Shingrix Completed?: No.    Education has been provided regarding the importance of this vaccine. Patient has been advised to call insurance company to determine out of pocket expense if they have not yet received this vaccine. Advised may also receive vaccine at local pharmacy or Health Dept. Verbalized acceptance and understanding.  Screening Tests Health Maintenance  Topic Date Due   Diabetic kidney evaluation - eGFR measurement  01/16/2023   INFLUENZA VACCINE  03/10/2023   Zoster Vaccines- Shingrix (1 of 2) 05/11/2023 (Originally 02/11/1986)   Diabetic kidney evaluation - Urine ACR  08/11/2023   HEMOGLOBIN A1C  08/11/2023   OPHTHALMOLOGY EXAM  09/07/2023   FOOT EXAM  02/08/2024   Medicare Annual Wellness (AWV)  04/26/2024   DTaP/Tdap/Td (2 - Td or Tdap) 07/13/2025   Pneumonia Vaccine 41+ Years old  Completed   DEXA SCAN  Completed   HPV  VACCINES  Aged Out   COVID-19 Vaccine  Discontinued    Health Maintenance  Health Maintenance Due  Topic Date Due   Diabetic kidney evaluation - eGFR measurement  01/16/2023   INFLUENZA VACCINE  03/10/2023    Colorectal cancer screening: No longer required.   Mammogram status: No longer required due to age.  Bone Density status: Completed 06/14/2022.   Lung Cancer Screening: (Low Dose CT Chest recommended if Age 67-80 years, 20 pack-year currently smoking OR have quit w/in 15years.) does not qualify.   Lung Cancer Screening Referral: no  Additional Screening:  Hepatitis C Screening: does not qualify;   Vision Screening: Recommended annual ophthalmology exams for early detection of glaucoma and other disorders of the eye. Is the patient up to date with their annual eye exam?  Yes  Who is the provider or what is the name of the  office in which the patient attends annual eye exams? Dr. Senaida Ores If pt is not established with a provider, would they like to be referred to a provider to establish care? No .   Dental Screening: Recommended annual dental exams for proper oral hygiene  Diabetic Foot Exam: Diabetic Foot Exam: Completed 02/08/2023  Community Resource Referral / Chronic Care Management: CRR required this visit?  No   CCM required this visit?  No     Plan:     I have personally reviewed and noted the following in the patient's chart:   Medical and social history Use of alcohol, tobacco or illicit drugs  Current medications and supplements including opioid prescriptions. Patient is not currently taking opioid prescriptions. Functional ability and status Nutritional status Physical activity Advanced directives List of other physicians Hospitalizations, surgeries, and ER visits in previous 12 months Vitals Screenings to include cognitive, depression, and falls Referrals and appointments  In addition, I have reviewed and discussed with patient certain  preventive protocols, quality metrics, and best practice recommendations. A written personalized care plan for preventive services as well as general preventive health recommendations were provided to patient.     Barb Merino, LPN   1/61/0960   After Visit Summary: (MyChart) Due to this being a telephonic visit, the after visit summary with patients personalized plan was offered to patient via MyChart   Nurse Notes: none

## 2023-05-04 ENCOUNTER — Other Ambulatory Visit: Payer: Self-pay | Admitting: Primary Care

## 2023-05-04 DIAGNOSIS — M810 Age-related osteoporosis without current pathological fracture: Secondary | ICD-10-CM

## 2023-05-11 ENCOUNTER — Encounter: Payer: Self-pay | Admitting: Primary Care

## 2023-05-11 ENCOUNTER — Ambulatory Visit (INDEPENDENT_AMBULATORY_CARE_PROVIDER_SITE_OTHER): Payer: Medicare Other | Admitting: Primary Care

## 2023-05-11 VITALS — BP 148/82 | HR 100 | Temp 97.9°F | Ht 65.0 in | Wt 122.0 lb

## 2023-05-11 DIAGNOSIS — Z7984 Long term (current) use of oral hypoglycemic drugs: Secondary | ICD-10-CM | POA: Diagnosis not present

## 2023-05-11 DIAGNOSIS — E785 Hyperlipidemia, unspecified: Secondary | ICD-10-CM | POA: Insufficient documentation

## 2023-05-11 DIAGNOSIS — I10 Essential (primary) hypertension: Secondary | ICD-10-CM

## 2023-05-11 DIAGNOSIS — R300 Dysuria: Secondary | ICD-10-CM | POA: Insufficient documentation

## 2023-05-11 DIAGNOSIS — E1169 Type 2 diabetes mellitus with other specified complication: Secondary | ICD-10-CM

## 2023-05-11 DIAGNOSIS — R2689 Other abnormalities of gait and mobility: Secondary | ICD-10-CM

## 2023-05-11 LAB — COMPREHENSIVE METABOLIC PANEL
ALT: 14 U/L (ref 0–35)
AST: 16 U/L (ref 0–37)
Albumin: 4 g/dL (ref 3.5–5.2)
Alkaline Phosphatase: 114 U/L (ref 39–117)
BUN: 14 mg/dL (ref 6–23)
CO2: 23 meq/L (ref 19–32)
Calcium: 9.6 mg/dL (ref 8.4–10.5)
Chloride: 99 meq/L (ref 96–112)
Creatinine, Ser: 0.99 mg/dL (ref 0.40–1.20)
GFR: 52.09 mL/min — ABNORMAL LOW (ref >60.00)
Glucose, Bld: 284 mg/dL — ABNORMAL HIGH (ref 70–99)
Potassium: 4.6 meq/L (ref 3.5–5.1)
Sodium: 132 meq/L — ABNORMAL LOW (ref 135–145)
Total Bilirubin: 0.4 mg/dL (ref 0.2–1.2)
Total Protein: 8.1 g/dL (ref 6.0–8.3)

## 2023-05-11 LAB — POCT GLYCOSYLATED HEMOGLOBIN (HGB A1C): Hemoglobin A1C: 8.3 % — AB (ref 4.0–5.6)

## 2023-05-11 LAB — POC URINALSYSI DIPSTICK (AUTOMATED)
Bilirubin, UA: NEGATIVE
Blood, UA: POSITIVE
Glucose, UA: POSITIVE — AB
Ketones, UA: POSITIVE
Nitrite, UA: NEGATIVE
Protein, UA: POSITIVE — AB
Spec Grav, UA: 1.015 (ref 1.010–1.025)
Urobilinogen, UA: 0.2 U/dL
pH, UA: 5 (ref 5.0–8.0)

## 2023-05-11 LAB — LIPID PANEL
Cholesterol: 180 mg/dL (ref 0–200)
HDL: 49 mg/dL (ref >39.00)
LDL Cholesterol: 98 mg/dL (ref 0–99)
NonHDL: 131.26
Total CHOL/HDL Ratio: 4
Triglycerides: 164 mg/dL — ABNORMAL HIGH (ref 0.0–149.0)
VLDL: 32.8 mg/dL (ref 0.0–40.0)

## 2023-05-11 MED ORDER — NITROFURANTOIN MONOHYD MACRO 100 MG PO CAPS
100.0000 mg | ORAL_CAPSULE | Freq: Two times a day (BID) | ORAL | 0 refills | Status: AC
Start: 2023-05-11 — End: 2023-05-16

## 2023-05-11 MED ORDER — GLIMEPIRIDE 4 MG PO TABS
ORAL_TABLET | ORAL | 1 refills | Status: DC
Start: 2023-05-11 — End: 2023-08-16

## 2023-05-11 NOTE — Assessment & Plan Note (Signed)
Declines statin therapy multiple times, including now. Repeat lipid panel pending.

## 2023-05-11 NOTE — Progress Notes (Signed)
Subjective:    Patient ID: Morgan Bowen, female    DOB: [birth year], 87 y.o.   MRN: [id number]  HPI  Morgan Bowen is a very pleasant 87 y.o. female with a history of type 2 diabetes, hypertension, anxiety and depression, decreased renal function, palpitations who presents today for follow up of diabetes, hypertension. She would also like to discuss dysuria.  1) Type 2 Diabetes:   Current medications include: Glimepiride 2mg  BID, metformin 1000 mg BID  She is checking her blood glucose 3 times weekly and is getting readings of: low 200s fasting.   Last A1C: 8.2 in July 2024, 8.3 today Last Eye Exam: UTD Last Foot Exam: UTD Pneumonia Vaccination: 2019 Urine Microalbumin: UTD Statin: None, declines   Dietary changes since last visit: None   Exercise: Inactive   2) Dysuria: Symptom onset 3 days ago with dysuria.  Yesterday she began to notice increased urinary frequency.  She believes that she is getting a UTI as symptoms are similar to prior UTIs.  She denies flank pain, hematuria, fevers.  She began drinking vinegar water and has noticed improvement in symptoms.   3) Hypertension: Currently managed on amlodipine 10 mg daily, irbesartan 300 mg daily, metoprolol succinate 25 mg daily.  She has a prescription at home for clonidine 0.1 mg to take if systolic blood pressure rises above 170.  She denies headaches, dizziness, chest pain.  She is checking her blood pressure at home which is running 120s over 70s.  BP Readings from Last 3 Encounters:  05/11/23 (!) 148/82  02/24/23 (!) 156/65  02/08/23 136/68     Review of Systems  Respiratory:  Negative for shortness of breath.   Cardiovascular:  Negative for chest pain.  Musculoskeletal:  Positive for arthralgias.  Neurological:  Negative for dizziness, numbness and headaches.         Past Medical History:  Diagnosis Date   Acute cystitis 05/23/2018   Acute deep vein thrombosis (DVT) of right tibial vein (HCC) 04/03/2020    Anxiety    Arthritis    Bronchitis 06/17/2015   Cellulitis 12/13/2018   Chickenpox    COVID-19 virus infection 06/24/2022   Diabetes mellitus without complication (HCC)    Dizziness 01/15/2022   Fall 12/10/2022   GERD (gastroesophageal reflux disease)    Hypertension    Laceration of skin and cutaneous sensory nerve of left upper extremity 04/13/2022   Osteoporosis    Type 2 diabetes mellitus (HCC)    Urinary tract infection     Social History   Socioeconomic History   Marital status: Widowed    Spouse name: Not on file   Number of children: Not on file   Years of education: Not on file   Highest education level: 12th grade  Occupational History   Not on file  Tobacco Use   Smoking status: Never   Smokeless tobacco: Never  Vaping Use   Vaping status: Never Used  Substance and Sexual Activity   Alcohol use: No   Drug use: No   Sexual activity: Not Currently  Other Topics Concern   Not on file  Social History Narrative   ** Merged History Encounter **       Widowed. Moved from [place]. 1 son, 3 grandchildren. Retired. Once worked as a Futures trader.    Social Determinants of Health   Financial Resource Strain: Low Risk  (04/27/2023)   Overall Financial Resource Strain (CARDIA)    Difficulty of Paying Living Expenses: Not  hard at all  Food Insecurity: No Food Insecurity (04/27/2023)   Hunger Vital Sign    Worried About Running Out of Food in the Last Year: Never true    Ran Out of Food in the Last Year: Never true  Transportation Needs: No Transportation Needs (04/27/2023)   PRAPARE - Administrator, Civil Service (Medical): No    Lack of Transportation (Non-Medical): No  Physical Activity: Inactive (04/27/2023)   Exercise Vital Sign    Days of Exercise per Week: 0 days    Minutes of Exercise per Session: 0 min  Stress: No Stress Concern Present (04/27/2023)   [employer] of Occupational Health - Occupational Stress Questionnaire     Feeling of Stress : Not at all  Social Connections: Socially Isolated (04/27/2023)   Social Connection and Isolation Panel [NHANES]    Frequency of Communication with Friends and Family: Patient unable to answer    Frequency of Social Gatherings with Friends and Family: More than three times a week    Attends Religious Services: Never    Database administrator or Organizations: No    Attends Banker Meetings: Never    Marital Status: Widowed  Intimate Partner Violence: Not At Risk (04/27/2023)   Humiliation, Afraid, Rape, and Kick questionnaire    Fear of Current or Ex-Partner: No    Emotionally Abused: No    Physically Abused: No    Sexually Abused: No    Past Surgical History:  Procedure Laterality Date   ABDOMINAL HYSTERECTOMY  1984   ABDOMINAL HYSTERECTOMY     APPENDECTOMY  1958   CATARACT EXTRACTION Right 04/25/2023   CHOLECYSTECTOMY     GALLBLADDER SURGERY     MANDIBLE FRACTURE SURGERY  07/13/2016   TONSILLECTOMY  1958   TONSILLECTOMY     WRIST FRACTURE SURGERY Right    2000    Family History  Problem Relation Age of Onset   Hypertension Mother    Stroke Mother    Stroke Father    Hypertension Father    Hypertension Sister    Kidney disease Sister    Breast cancer Neg Hx     Allergies  Allergen Reactions   Sulfamethoxazole-Trimethoprim     Other reaction(s): Not available   Penicillins Rash    Current Outpatient Medications on File Prior to Visit  Medication Sig Dispense Refill   amLODipine (NORVASC) 10 MG tablet TAKE ONE TABLET BY MOUTH ONCE DAILY FOR BLOOD PRESSURE 90 tablet 2   ascorbic acid (VITAMIN C) 500 MG tablet Take 500 mg by mouth daily.     Calcium-Magnesium-Vitamin D 500-250-125 MG-MG-UNIT TABS Take 1 tablet by mouth daily.     cloNIDine (CATAPRES) 0.1 MG tablet Take 1 tablet by mouth once daily as needed for systolic blood pressure greater than 170. 30 tablet 0   co-enzyme Q-10 30 MG capsule Take 30 mg by mouth 3 (three) times  daily.     glucose blood (ONE TOUCH ULTRA TEST) test strip USE AS DIRECTED TEST 3 TIMES DAILY E11.9 100 each 2   irbesartan (AVAPRO) 300 MG tablet TAKE ONE TABLET BY MOUTH ONCE DAILY FOR BLOOD PRESSURE 90 tablet 2   metFORMIN (GLUCOPHAGE) 1000 MG tablet TAKE ONE TABLET BY MOUTH TWICE DAILY WITH A MEAL FOR DIABETES 180 tablet 1   metoprolol succinate (TOPROL-XL) 25 MG 24 hr tablet TAKE ONE TABLET BY MOUTH ONCE DAILY FOR BLOOD PRESSURE 90 tablet 2   Omega-3 Fatty Acids (FISH OIL) 300 MG  CAPS Take by mouth.     Turmeric (QC TUMERIC COMPLEX) 500 MG CAPS Take by mouth.     Vitamin D, Ergocalciferol, (DRISDOL) 1.25 MG (50000 UNIT) CAPS capsule TAKE ONE CAPSULE BY MOUTH ONCE A WEEK 12 capsule 0   No current facility-administered medications on file prior to visit.    BP (!) 148/82   Pulse 100   Temp 97.9 F (36.6 C) (Temporal)   Ht 5\' 5"  (1.651 m)   Wt 122 lb (55.3 kg)   SpO2 98%   BMI 20.30 kg/m  Objective:   Physical Exam Cardiovascular:     Rate and Rhythm: Normal rate and regular rhythm.  Pulmonary:     Effort: Pulmonary effort is normal.     Breath sounds: Normal breath sounds.  Musculoskeletal:     Cervical back: Neck supple.  Skin:    General: Skin is warm and dry.  Neurological:     Mental Status: She is alert and oriented to person, place, and time.  Psychiatric:        Mood and Affect: Mood normal.           Assessment & Plan:  Type 2 diabetes mellitus with other specified complication, without long-term current use of insulin (HCC) Assessment & Plan: Uncontrolled and increased with A1c 8.3 today.  Continue Metformin 1000 mg BID.  Increase Glimepiride to 4 mg in AM, and 2 mg in PM.   Discussed continuing to work on diet and lifestyle changes.   CMP and lipid panel pending.  Follow up in 3 months.   I evaluated patient, was consulted regarding treatment, and agree with assessment and plan per Tenna Delaine, RN, DNP student.   Mayra Reel,  NP-C    Orders: -     POCT glycosylated hemoglobin (Hb A1C) -     Glimepiride; Take 1 tablet by mouth every morning and 1/2 tablet by mouth every evening for diabetes.  Dispense: 135 tablet; Refill: 1  Dysuria Assessment & Plan: Urinalysis today with 3+ leuks, 1+ blood. Culture ordered and pending.  Given history of cystitis, coupled with urinalysis findings, we will treat.  Start Macrobid (nitrofurantoin) tablets for urinary tract infection. Take 1 tablet by mouth twice daily for 5 days.  Continue with increased water intake.  Orders: -     POCT Urinalysis Dipstick (Automated) -     Urine Culture -     Nitrofurantoin Monohyd Macro; Take 1 capsule (100 mg total) by mouth 2 (two) times daily for 5 days.  Dispense: 10 capsule; Refill: 0  Essential hypertension Assessment & Plan: Stable.  Home readings are better.  Continue amlodipine 10 mg daily, irbesartan 300 mg daily, metoprolol succinate 25 mg daily. CMP pending.  Orders: -     Comprehensive metabolic panel  Hyperlipidemia, unspecified hyperlipidemia type Assessment & Plan: Declines statin therapy multiple times, including now. Repeat lipid panel pending.  Orders: -     Lipid panel  Imbalance Assessment & Plan: Continue physical therapy treatment.         Doreene Nest, NP

## 2023-05-11 NOTE — Progress Notes (Signed)
Established Patient Office Visit  Subjective   Patient ID: Morgan Bowen, female    DOB: 1936/06/23  Age: 87 y.o. MRN: [id number]  Chief Complaint  Patient presents with   Medical Management of Chronic Issues    HPI  Morgan Bowen is a very pleasant 87 y.o. female with a history of hypertension, type 2 diabetes, osteoporosis, anxiety and depression, imbalance, falls who presents today for diabetes follow up. She would also like to discuss her dysuria today.   Current medications include: metformin 1000 mg BID, glimepiride 2 mg BID.   She is taking her medications as prescribed.   She is checking her blood glucose 3-4 times per week in the morning and is getting readings of low 200's.   Last A1C: 8.3 today, previous 8.2 on 02/08/23 Last Eye Exam: UTD, cataract surgery 2 weeks ago Last Foot Exam: UTD Pneumonia Vaccination: UTD Urine Microalbumin: UTD Statin: declines Flu shot: declines  Dietary changes since last visit: She has difficulty cooking for one person. She drinks coca cola daily. Overall feels that her diet could be improved, but she does make sure to eat vegetables.  Exercise: No regular exercise. She wears compression stockings. She uses a cane for balance and uses a walker inside her home.   Additionally, she is concerned that she could be developing a UTI due to symptoms of burning and increased frequency.    Review of Systems  Constitutional:  Negative for chills, fever and malaise/fatigue.  Eyes:  Negative for blurred vision and double vision.  Respiratory:  Negative for shortness of breath.   Cardiovascular:  Negative for chest pain.  Gastrointestinal:  Negative for constipation, diarrhea, nausea and vomiting.  Genitourinary:  Positive for dysuria and frequency. Negative for hematuria.  Musculoskeletal:  Negative for falls.  Neurological:  Negative for dizziness and headaches.      Objective:     BP (!) 148/82   Pulse 100   Temp 97.9 F (36.6 C)  (Temporal)   Ht 5\' 5"  (1.651 m)   Wt 122 lb (55.3 kg)   SpO2 98%   BMI 20.30 kg/m   BP Readings from Last 3 Encounters:  05/11/23 (!) 148/82  02/24/23 (!) 156/65  02/08/23 136/68   Wt Readings from Last 3 Encounters:  05/11/23 122 lb (55.3 kg)  02/08/23 121 lb (54.9 kg)  01/05/23 123 lb (55.8 kg)    Physical Exam Cardiovascular:     Rate and Rhythm: Normal rate and regular rhythm.     Heart sounds: Normal heart sounds.  Pulmonary:     Effort: Pulmonary effort is normal.     Breath sounds: Normal breath sounds.  Skin:    General: Skin is warm and dry.  Neurological:     Mental Status: She is alert.     Results for orders placed or performed in visit on 05/11/23  POCT glycosylated hemoglobin (Hb A1C)  Result Value Ref Range   Hemoglobin A1C 8.3 (A) 4.0 - 5.6 %   HbA1c POC (<> result, manual entry)     HbA1c, POC (prediabetic range)     HbA1c, POC (controlled diabetic range)    POCT Urinalysis Dipstick (Automated)  Result Value Ref Range   Color, UA yellow    Clarity, UA cloudy    Glucose, UA Positive (A) Negative   Bilirubin, UA neg    Ketones, UA pos    Spec Grav, UA 1.015 1.010 - 1.025   Blood, UA pos    pH, UA  5.0 5.0 - 8.0   Protein, UA Positive (A) Negative   Urobilinogen, UA 0.2 0.2 or 1.0 E.U./dL   Nitrite, UA neg    Leukocytes, UA Large (3+) (A) Negative      The ASCVD Risk score (Arnett DK, et al., 2019) failed to calculate for the following reasons:   The 2019 ASCVD risk score is only valid for ages 80 to 34    Assessment & Plan:   Problem List Items Addressed This Visit       Cardiovascular and Mediastinum   Essential hypertension    Stable.  Home readings are better.  Continue amlodipine 10 mg daily, irbesartan 300 mg daily, metoprolol succinate 25 mg daily. CMP pending.      Relevant Orders   Comprehensive metabolic panel     Endocrine   Type 2 diabetes mellitus with other specified complication (HCC) - Primary    Uncontrolled  with A1c 8.3 today.  Continue Metformin 1000 mg BID.  Increase Glimepiride to 4 mg in AM, and 2 mg in PM.   Discussed continuing to work on diet and lifestyle changes.   CMP and lipid panel pending.  Follow up in 3 months.        Relevant Medications   glimepiride (AMARYL) 4 MG tablet   Other Relevant Orders   POCT glycosylated hemoglobin (Hb A1C) (Completed)     Other   Imbalance    Continue physical therapy treatment.      Dysuria    Urinalysis today with 3+ leuks, 1+ blood. Culture ordered and pending.  Given history of cystitis, coupled with urinalysis findings, we will treat.  Start Macrobid (nitrofurantoin) tablets for urinary tract infection. Take 1 tablet by mouth twice daily for 5 days.  Continue with increased water intake.      Relevant Medications   nitrofurantoin, macrocrystal-monohydrate, (MACROBID) 100 MG capsule   Other Relevant Orders   POCT Urinalysis Dipstick (Automated) (Completed)   Urine Culture   Hyperlipidemia    Declines statin therapy multiple times, including now. Repeat lipid panel pending.      Relevant Orders   Lipid Panel    No follow-ups on file.    Benito Mccreedy, RN

## 2023-05-11 NOTE — Assessment & Plan Note (Signed)
Continue physical therapy treatment.

## 2023-05-11 NOTE — Assessment & Plan Note (Deleted)
Uncontrolled and slightly above goal, worse than last time.  Long discussion about her chronic history of hyperglycemia.  Increase glimepiride to 4 mg in the morning and continue 2 mg in the evening. Continue metformin 1000 mg twice daily.  Follow-up in 3 months.

## 2023-05-11 NOTE — Assessment & Plan Note (Addendum)
Uncontrolled and increased with A1c 8.3 today.  Continue Metformin 1000 mg BID.  Increase Glimepiride to 4 mg in AM, and 2 mg in PM.   Discussed continuing to work on diet and lifestyle changes.   CMP and lipid panel pending.  Follow up in 3 months.   I evaluated patient, was consulted regarding treatment, and agree with assessment and plan per Tenna Delaine, RN, DNP student.   Mayra Reel, NP-C

## 2023-05-11 NOTE — Patient Instructions (Addendum)
Stop by the lab prior to leaving today. I will notify you of your results once received.   Start Macrobid (nitrofurantoin) tablets for urinary tract infection. Take 1 tablet by mouth twice daily for 5 days.  We increased your glimepiride prescription for diabetes.  Take 4 mg by mouth every morning and 2 mg in the afternoon.  Please schedule a follow up visit for 3 months for diabetes check.  It was a pleasure to see you today!

## 2023-05-11 NOTE — Assessment & Plan Note (Addendum)
Stable.  Home readings are better.  Continue amlodipine 10 mg daily, irbesartan 300 mg daily, metoprolol succinate 25 mg daily. CMP pending.

## 2023-05-11 NOTE — Assessment & Plan Note (Signed)
Urinalysis today with 3+ leuks, 1+ blood. Culture ordered and pending.  Given history of cystitis, coupled with urinalysis findings, we will treat.  Start Macrobid (nitrofurantoin) tablets for urinary tract infection. Take 1 tablet by mouth twice daily for 5 days.  Continue with increased water intake.

## 2023-05-14 LAB — URINE CULTURE
MICRO NUMBER:: 15542382
SPECIMEN QUALITY:: ADEQUATE

## 2023-05-16 ENCOUNTER — Telehealth: Payer: Self-pay

## 2023-05-16 DIAGNOSIS — H2512 Age-related nuclear cataract, left eye: Secondary | ICD-10-CM | POA: Diagnosis not present

## 2023-05-16 NOTE — Telephone Encounter (Signed)
I spoke with pt; offered to schedule pt appt but pt said she has cataract surgery this morning and pt took last abx on 05/15/23 and nausea is much better this morning and pt wants to wait and cb if appt needed.Charles pts son (DPR signed also on speaker phone with pt and he wants chart  noted that macrobid caused pt severe nausea. Noted on contraindications list for pt. Sending note to Allayne Gitelman NP who is out of office today and [facility].

## 2023-05-16 NOTE — Telephone Encounter (Signed)
Noted  

## 2023-05-27 ENCOUNTER — Ambulatory Visit (INDEPENDENT_AMBULATORY_CARE_PROVIDER_SITE_OTHER): Payer: Medicare Other | Admitting: Podiatry

## 2023-05-27 DIAGNOSIS — B351 Tinea unguium: Secondary | ICD-10-CM | POA: Diagnosis not present

## 2023-05-27 DIAGNOSIS — M79674 Pain in right toe(s): Secondary | ICD-10-CM

## 2023-05-27 DIAGNOSIS — D689 Coagulation defect, unspecified: Secondary | ICD-10-CM | POA: Diagnosis not present

## 2023-05-27 DIAGNOSIS — M79675 Pain in left toe(s): Secondary | ICD-10-CM | POA: Diagnosis not present

## 2023-05-27 DIAGNOSIS — E119 Type 2 diabetes mellitus without complications: Secondary | ICD-10-CM | POA: Diagnosis not present

## 2023-05-30 ENCOUNTER — Other Ambulatory Visit: Payer: Self-pay | Admitting: Primary Care

## 2023-05-30 DIAGNOSIS — M810 Age-related osteoporosis without current pathological fracture: Secondary | ICD-10-CM

## 2023-05-30 NOTE — Progress Notes (Unsigned)
Cardiology Office Note  Date:  05/31/2023   ID:  Morgan Bowen, DOB 1936/06/09, MRN [id number]  PCP:  Doreene Nest, NP   Chief Complaint  Patient presents with   New Patient (Initial Visit)    Self referral for SVT. Patient c/o racing heart beats Medications reviewed by the patient verbally.     HPI:  [name] is a 87 year old woman with past medical history of Diabetes type 2, A1c 8.3 Hypertension Anxiety depression DVT in 03/2020, eliquis 3 months Who presents for by referral from Dr. Chestine Spore for tachycardia, hypertension  Lives in a condo, moved 7 years ago from [place] Husband died 9 years ago She still drives, son lives locally [place] and checks on her  For blood pressure taking : amlodipine 10 daily (pm), metoprolol succinate 25 daily (pm), irbesartan 300 daily (Am) Clonidine 0.1 as needed  Appreciates long hx of episodic paroxysmal tachycardia Episodes of tachycardia with heart rate 140-160 bpm Can last 15-20 min at a time Feels it in her head and chest Happens <1 per month, last episode several months ago  Does PT Tries to stay active  Home blood pressure numbers:  Highest measured pressures, 160-200 systolic, takes clonidine as needed, only taken 1 over the past year AVG systolic pressure 135  Lab work reviewed A1c 8.3  EKG personally reviewed by myself on todays visit EKG Interpretation Date/Time:  Tuesday May 31 2023 14:47:14 EDT Ventricular Rate:  113 PR Interval:  136 QRS Duration:  78 QT Interval:  328 QTC Calculation: 449 R Axis:   1  Text Interpretation: Sinus tachycardia Cannot rule out Anterior infarct (cited on or before 13-Apr-2020) When compared with ECG of 11-Jan-2022 18:35, Questionable change in initial forces of Anteroseptal leads Confirmed by Julien Nordmann [phone]) on 05/31/2023 2:52:39 PM    PMH:   has a past medical history of Acute cystitis (05/23/2018), Acute deep vein thrombosis (DVT) of right tibial vein  (HCC) (04/03/2020), Anxiety, Arthritis, Bronchitis (06/17/2015), Cellulitis (12/13/2018), Chickenpox, COVID-19 virus infection (06/24/2022), Diabetes mellitus without complication (HCC), Dizziness (01/15/2022), Fall (12/10/2022), GERD (gastroesophageal reflux disease), Hypertension, Laceration of skin and cutaneous sensory nerve of left upper extremity (04/13/2022), Osteoporosis, Type 2 diabetes mellitus (HCC), and Urinary tract infection.  PSH:    Past Surgical History:  Procedure Laterality Date   ABDOMINAL HYSTERECTOMY  1984   ABDOMINAL HYSTERECTOMY     APPENDECTOMY  1958   CATARACT EXTRACTION Right 04/25/2023   CHOLECYSTECTOMY     GALLBLADDER SURGERY     MANDIBLE FRACTURE SURGERY  07/13/2016   TONSILLECTOMY  1958   TONSILLECTOMY     WRIST FRACTURE SURGERY Right    2000    Current Outpatient Medications  Medication Sig Dispense Refill   amLODipine (NORVASC) 10 MG tablet TAKE ONE TABLET BY MOUTH ONCE DAILY FOR BLOOD PRESSURE 90 tablet 2   ascorbic acid (VITAMIN C) 500 MG tablet Take 500 mg by mouth daily.     Calcium-Magnesium-Vitamin D 500-250-125 MG-MG-UNIT TABS Take 1 tablet by mouth daily.     cloNIDine (CATAPRES) 0.1 MG tablet Take 1 tablet by mouth once daily as needed for systolic blood pressure greater than 170. 30 tablet 0   co-enzyme Q-10 30 MG capsule Take 30 mg by mouth 3 (three) times daily.     glimepiride (AMARYL) 4 MG tablet Take 1 tablet by mouth every morning and 1/2 tablet by mouth every evening for diabetes. 135 tablet 1   glucose blood (ONE TOUCH ULTRA TEST) test  strip USE AS DIRECTED TEST 3 TIMES DAILY E11.9 100 each 2   irbesartan (AVAPRO) 300 MG tablet TAKE ONE TABLET BY MOUTH ONCE DAILY FOR BLOOD PRESSURE 90 tablet 2   metFORMIN (GLUCOPHAGE) 1000 MG tablet TAKE ONE TABLET BY MOUTH TWICE DAILY WITH A MEAL FOR DIABETES 180 tablet 1   metoprolol succinate (TOPROL-XL) 25 MG 24 hr tablet TAKE ONE TABLET BY MOUTH ONCE DAILY FOR BLOOD PRESSURE 90 tablet 2   Omega-3  Fatty Acids (FISH OIL) 300 MG CAPS Take by mouth.     Turmeric (QC TUMERIC COMPLEX) 500 MG CAPS Take by mouth.     Vitamin D, Ergocalciferol, (DRISDOL) 1.25 MG (50000 UNIT) CAPS capsule TAKE 1 CAPSULE BY MOUTH ONCE WEEKLY 12 capsule 0   No current facility-administered medications for this visit.    Allergies:   Macrobid [nitrofurantoin], Sulfamethoxazole-trimethoprim, and Penicillins   Social History:  The patient  reports that she has never smoked. She has never used smokeless tobacco. She reports that she does not drink alcohol and does not use drugs.   Family History:   family history includes Hypertension in her father, mother, and sister; Kidney disease in her sister; Stroke in her father and mother.   Review of Systems: Review of Systems  Constitutional: Negative.   HENT: Negative.    Respiratory: Negative.    Cardiovascular: Negative.   Gastrointestinal: Negative.   Musculoskeletal: Negative.   Neurological: Negative.   Psychiatric/Behavioral: Negative.    All other systems reviewed and are negative.  PHYSICAL EXAM: VS:  BP (!) 160/50 (BP Location: Right Arm, Patient Position: Sitting, Cuff Size: Normal)   Pulse (!) 113   Ht 5\' 5"  (1.651 m)   Wt 120 lb 3.2 oz (54.5 kg)   SpO2 97%   BMI 20.00 kg/m  , BMI Body mass index is 20 kg/m. Constitutional:  oriented to person, place, and time. No distress.  HENT:  Head: Grossly normal Eyes:  no discharge. No scleral icterus.  Neck: No JVD, no carotid bruits  Cardiovascular: Regular, tachycardic,, no murmurs appreciated Pulmonary/Chest: Clear to auscultation bilaterally, no wheezes or rails Abdominal: Soft.  no distension.  no tenderness.  Musculoskeletal: Normal range of motion Neurological:  normal muscle tone. Coordination normal. No atrophy Skin: Skin warm and dry Psychiatric: normal affect, pleasant  Recent Labs: 05/11/2023: ALT 14; BUN 14; Creatinine, Ser 0.99; Potassium 4.6; Sodium 132    Lipid Panel Lab Results   Component Value Date   CHOL 180 05/11/2023   HDL 49.00 05/11/2023   LDLCALC 98 05/11/2023   TRIG 164.0 (H) 05/11/2023    Wt Readings from Last 3 Encounters:  05/31/23 120 lb 3.2 oz (54.5 kg)  05/11/23 122 lb (55.3 kg)  02/08/23 121 lb (54.9 kg)     ASSESSMENT AND PLAN:  Problem List Items Addressed This Visit       Cardiology Problems   Essential hypertension - Primary   Relevant Orders   EKG 12-Lead (Completed)   Hyperlipidemia     Other   Type 2 diabetes mellitus with other specified complication (HCC)   Sinus tachycardia   Relevant Orders   EKG 12-Lead (Completed)   Palpitations   Relevant Orders   EKG 12-Lead (Completed)   Sinus tachycardia/paroxysmal tachycardia Rare episodes of tachycardia lasting 15 to 20 minutes, etiology unclear Last episode several months ago Zio monitor less likely to capture event given rare nature of presentation We have recommended she increase her metoprolol succinate up to 25 twice daily Suggested  monitoring heart rate at home and call us with numbers Metoprolol could be titrated upwards further for rate and him response  Essential hypertension Increase metoprolol succinate up to 25 twice daily, may need 37.5 twice daily Reports blood pressure has been running high at home though seems somewhat labile.  Average pressure 1 30-1 40, sometimes peaking 160 up to 200 She takes clonidine as needed for systolic pressures maintained over 160, has only had to take this once Additional medication options available include changing amlodipine to diltiazem ER for rate /rhythm control and blood pressure control  Diabetes type 2 Managed by primary care A1c greater than 8     Signed, Dossie Arbour, M.D., Ph.D. [facility] [place], [place] [phone]

## 2023-05-31 ENCOUNTER — Encounter: Payer: Self-pay | Admitting: Cardiovascular Disease

## 2023-05-31 ENCOUNTER — Other Ambulatory Visit: Payer: Self-pay | Admitting: Primary Care

## 2023-05-31 ENCOUNTER — Ambulatory Visit: Payer: Medicare Other | Attending: Cardiovascular Disease | Admitting: Cardiovascular Disease

## 2023-05-31 VITALS — BP 160/50 | HR 113 | Ht 65.0 in | Wt 120.2 lb

## 2023-05-31 DIAGNOSIS — R000 Tachycardia, unspecified: Secondary | ICD-10-CM | POA: Insufficient documentation

## 2023-05-31 DIAGNOSIS — E782 Mixed hyperlipidemia: Secondary | ICD-10-CM | POA: Diagnosis not present

## 2023-05-31 DIAGNOSIS — I10 Essential (primary) hypertension: Secondary | ICD-10-CM | POA: Diagnosis not present

## 2023-05-31 DIAGNOSIS — M810 Age-related osteoporosis without current pathological fracture: Secondary | ICD-10-CM

## 2023-05-31 DIAGNOSIS — E1169 Type 2 diabetes mellitus with other specified complication: Secondary | ICD-10-CM | POA: Insufficient documentation

## 2023-05-31 DIAGNOSIS — R002 Palpitations: Secondary | ICD-10-CM | POA: Insufficient documentation

## 2023-05-31 MED ORDER — METOPROLOL SUCCINATE ER 25 MG PO TB24
ORAL_TABLET | ORAL | 2 refills | Status: DC
Start: 2023-05-31 — End: 2023-11-22

## 2023-05-31 NOTE — Patient Instructions (Signed)
Medication Instructions:  Please increase the metoprolol succinate up to 25 mg twice a day  Monitor heart rate and blood pressure  If you need a refill on your cardiac medications before your next appointment, please call your pharmacy.   Lab work: No new labs needed  Testing/Procedures: No new testing needed  Follow-Up: At [facility], you and your health needs are our priority.  As part of our continuing mission to provide you with exceptional heart care, we have created designated Provider Care Teams.  These Care Teams include your primary Cardiologist (physician) and Advanced Practice Providers (APPs -  Physician Assistants and Nurse Practitioners) who all work together to provide you with the care you need, when you need it.  You will need a follow up appointment in 3 months  Providers on your designated Care Team:   Nicolasa Ducking, NP Eula Listen, PA-C Cadence Fransico Michael, [place]  COVID-19 Vaccine Information can be found at: [email or web address] For questions related to vaccine distribution or appointments, please email [email or web address] or call [phone].

## 2023-06-04 NOTE — Progress Notes (Signed)
Subjective:  Patient ID: Morgan Bowen, female    DOB: November 20, 1935,  MRN: [id number]  Morgan Bowen presents to clinic today for at risk foot care with h/o coagulation defect and painful thick toenails that are difficult to trim. Pain interferes with ambulation. Aggravating factors include wearing enclosed shoe gear. Pain is relieved with periodic professional debridement.   New problem(s): None.   PCP is Doreene Nest, NP. Morgan Bowen 05/11/2023.  Allergies  Allergen Reactions   Macrobid [Nitrofurantoin] Nausea Only    Pts son wanted noted that macrobid caused bad nausea   Sulfamethoxazole-Trimethoprim     Other reaction(s): Not available   Penicillins Rash   Review of Systems: Negative except as noted in the HPI.  Objective: No changes noted in today's physical examination. There were no vitals filed for this visit. Morgan Bowen is a pleasant 87 y.o. female WD, WN in NAD. AAO x 3.  Vascular Examination: Capillary refill time immediate b/l. Vascular status intact b/l with palpable pedal pulses. Pedal hair present b/l. No pain with calf compression b/l. Skin temperature gradient WNL b/l. No cyanosis or clubbing b/l. No ischemia or gangrene noted b/l.   Neurological Examination: Sensation grossly intact b/l with 10 gram monofilament. Vibratory sensation intact b/l.   Dermatological Examination: Pedal skin with normal turgor, texture and tone b/l.  No open wounds. No interdigital macerations.   Toenails 1-5 b/l thick, discolored, elongated with subungual debris and pain on dorsal palpation.   No hyperkeratotic nor porokeratotic lesions present on today's visit.  Musculoskeletal Examination: Normal muscle strength 5/5 to all lower extremity muscle groups bilaterally. No pain, crepitus or joint limitation noted with ROM b/l LE. No gross bony pedal deformities b/l. Patient ambulates with rollator assistance.  Radiographs: None  Last A1c:      Latest Ref Rng & Units 05/11/2023    8:42  AM 02/08/2023    8:03 AM 08/10/2022    9:16 AM  Hemoglobin A1C  Hemoglobin-A1c 4.0 - 5.6 % 8.3  8.2  7.8    Assessment/Plan: 1. Pain due to onychomycosis of toenails of both feet   2. Blood coagulation defect (HCC)   3. Type 2 diabetes mellitus without complication, without long-term current use of insulin (HCC)     -Consent given for treatment as described below: -Examined patient. -Patient to continue soft, supportive shoe gear daily. -Mycotic toenails 1-5 bilaterally were debrided in length and girth with sterile nail nippers and dremel without incident. -Patient/POA to call should there be question/concern in the interim.   Return in about 3 months (around 08/27/2023).  Freddie Breech, DPM

## 2023-08-02 ENCOUNTER — Other Ambulatory Visit: Payer: Self-pay | Admitting: Primary Care

## 2023-08-02 DIAGNOSIS — M810 Age-related osteoporosis without current pathological fracture: Secondary | ICD-10-CM

## 2023-08-02 DIAGNOSIS — E119 Type 2 diabetes mellitus without complications: Secondary | ICD-10-CM

## 2023-08-11 ENCOUNTER — Ambulatory Visit: Payer: Medicare Other | Admitting: Primary Care

## 2023-08-16 ENCOUNTER — Telehealth: Payer: Self-pay

## 2023-08-16 ENCOUNTER — Ambulatory Visit (INDEPENDENT_AMBULATORY_CARE_PROVIDER_SITE_OTHER)
Admission: RE | Admit: 2023-08-16 | Discharge: 2023-08-16 | Disposition: A | Discharge status: Home / Self Care | Payer: Medicare Other | Source: Ambulatory Visit | Attending: Primary Care | Admitting: Primary Care

## 2023-08-16 ENCOUNTER — Encounter: Payer: Self-pay | Admitting: Primary Care

## 2023-08-16 ENCOUNTER — Ambulatory Visit (INDEPENDENT_AMBULATORY_CARE_PROVIDER_SITE_OTHER): Payer: Medicare Other | Admitting: Primary Care

## 2023-08-16 VITALS — BP 136/82 | HR 75 | Temp 97.2°F | Ht 65.0 in | Wt 120.0 lb

## 2023-08-16 DIAGNOSIS — Z7984 Long term (current) use of oral hypoglycemic drugs: Secondary | ICD-10-CM | POA: Diagnosis not present

## 2023-08-16 DIAGNOSIS — M545 Low back pain, unspecified: Secondary | ICD-10-CM

## 2023-08-16 DIAGNOSIS — M4856XA Collapsed vertebra, not elsewhere classified, lumbar region, initial encounter for fracture: Secondary | ICD-10-CM | POA: Diagnosis not present

## 2023-08-16 DIAGNOSIS — E1169 Type 2 diabetes mellitus with other specified complication: Secondary | ICD-10-CM | POA: Diagnosis not present

## 2023-08-16 DIAGNOSIS — M419 Scoliosis, unspecified: Secondary | ICD-10-CM | POA: Diagnosis not present

## 2023-08-16 DIAGNOSIS — M546 Pain in thoracic spine: Secondary | ICD-10-CM

## 2023-08-16 DIAGNOSIS — M4186 Other forms of scoliosis, lumbar region: Secondary | ICD-10-CM | POA: Diagnosis not present

## 2023-08-16 DIAGNOSIS — I700 Atherosclerosis of aorta: Secondary | ICD-10-CM | POA: Diagnosis not present

## 2023-08-16 LAB — POCT GLYCOSYLATED HEMOGLOBIN (HGB A1C): Hemoglobin A1C: 8.8 % — AB (ref 4.0–5.6)

## 2023-08-16 MED ORDER — GLIMEPIRIDE 4 MG PO TABS
4.0000 mg | ORAL_TABLET | Freq: Two times a day (BID) | ORAL | Status: DC
Start: 2023-08-16 — End: 2023-10-04

## 2023-08-16 NOTE — Assessment & Plan Note (Signed)
 Deteriorated with A1c of 8.8 today.  Discussed to limit candy, macaroon's, Fritos chips. Protein, vegetables.  Increase glimepiride  to 4 mg twice daily. Continue metformin  1000 mg twice daily.  I have asked that she start checking her blood sugars to help monitor.  Close follow-up in 3 months.

## 2023-08-16 NOTE — Progress Notes (Signed)
 Subjective:    Patient ID: Morgan Bowen, female    DOB: [birth year], 88 y.o.   MRN: [id number]  Fall Pertinent negatives include no numbness.    Morgan Bowen is a very pleasant 88 y.o. female with a history of hypertension, type 2 diabetes, osteoporosis, decreased renal function, palpitations, hyperlipidemia who presents today for follow-up of diabetes.  She would also like to discuss acute back pain.  Her son joins us  today.  1) Type 2 Diabetes:   Current medications include: Glimepiride  4 mg in the a.m., 2 mg in the evening, metformin  1000 mg twice daily.  She is checking her blood glucose 0 times daily.  Last A1C: 8.3 in October 2024, 8.8 today Last Eye Exam: Up-to-date Last Foot Exam: Up-to-date Pneumonia Vaccination: Completed in 2019 Urine Microalbumin: Due Statin: None.  Declines.  Dietary changes since last visit: She works on her diet by eating vegetables. She limites sweets in general, has increased consumption of candy and coconut macaroon's. She also enjoys Environmental Consultant and has been snacking on these more frequently.   Exercise: None.   Wt Readings from Last 3 Encounters:  08/16/23 120 lb (54.4 kg)  05/31/23 120 lb 3.2 oz (54.5 kg)  05/11/23 122 lb (55.3 kg)     2) Acute Back Pain: Acute to the right lower thoracic back and right lower back since a fall in late November 2024, the day prior to thanksgiving.  Prior to her fall she was attempting to sit in her dining room chair to plug in her lights, she bent forward, her chair tilted her sideways, and she landed on her right lower back and tailbone. Since then she's been experiencing right thoracic and lumbar back pain, tailbone pain  She denies lower extremity pain, numbness/tingling, right hip pain. She has made an effort to get up once every hour to walk.     Review of Systems  Respiratory:  Negative for shortness of breath.   Cardiovascular:  Negative for chest pain.  Musculoskeletal:  Positive for back pain.   Skin:  Negative for color change.  Neurological:  Negative for numbness.         Past Medical History:  Diagnosis Date   Acute cystitis 05/23/2018   Acute deep vein thrombosis (DVT) of right tibial vein (HCC) 04/03/2020   Anxiety    Arthritis    Bronchitis 06/17/2015   Cellulitis 12/13/2018   Chickenpox    COVID-19 virus infection 06/24/2022   Diabetes mellitus without complication (HCC)    Dizziness 01/15/2022   Fall 12/10/2022   GERD (gastroesophageal reflux disease)    Hypertension    Laceration of skin and cutaneous sensory nerve of left upper extremity 04/13/2022   Osteoporosis    Type 2 diabetes mellitus (HCC)    Urinary tract infection     Social History   Socioeconomic History   Marital status: Widowed    Spouse name: Not on file   Number of children: Not on file   Years of education: Not on file   Highest education level: 12th grade  Occupational History   Not on file  Tobacco Use   Smoking status: Never   Smokeless tobacco: Never  Vaping Use   Vaping status: Never Used  Substance and Sexual Activity   Alcohol  use: No   Drug use: No   Sexual activity: Not Currently  Other Topics Concern   Not on file  Social History Narrative   ** Merged History Encounter **  Widowed. Moved from [place] . 1 son, 3 grandchildren. Retired. Once worked as a futures trader.    Social Drivers of Corporate Investment Banker Strain: Low Risk  (04/27/2023)   Overall Financial Resource Strain (CARDIA)    Difficulty of Paying Living Expenses: Not hard at all  Food Insecurity: No Food Insecurity (04/27/2023)   Hunger Vital Sign    Worried About Running Out of Food in the Last Year: Never true    Ran Out of Food in the Last Year: Never true  Transportation Needs: No Transportation Needs (04/27/2023)   PRAPARE - Administrator, Civil Service (Medical): No    Lack of Transportation (Non-Medical): No  Physical Activity: Inactive (04/27/2023)   Exercise  Vital Sign    Days of Exercise per Week: 0 days    Minutes of Exercise per Session: 0 min  Stress: No Stress Concern Present (04/27/2023)   [employer] of Occupational Health - Occupational Stress Questionnaire    Feeling of Stress : Not at all  Social Connections: Socially Isolated (04/27/2023)   Social Connection and Isolation Panel [NHANES]    Frequency of Communication with Friends and Family: Patient unable to answer    Frequency of Social Gatherings with Friends and Family: More than three times a week    Attends Religious Services: Never    Database Administrator or Organizations: No    Attends Banker Meetings: Never    Marital Status: Widowed  Intimate Partner Violence: Not At Risk (04/27/2023)   Humiliation, Afraid, Rape, and Kick questionnaire    Fear of Current or Ex-Partner: No    Emotionally Abused: No    Physically Abused: No    Sexually Abused: No    Past Surgical History:  Procedure Laterality Date   ABDOMINAL HYSTERECTOMY  1984   ABDOMINAL HYSTERECTOMY     APPENDECTOMY  1958   CATARACT EXTRACTION Right 04/25/2023   CHOLECYSTECTOMY     GALLBLADDER SURGERY     MANDIBLE FRACTURE SURGERY  07/13/2016   TONSILLECTOMY  1958   TONSILLECTOMY     WRIST FRACTURE SURGERY Right    2000    Family History  Problem Relation Age of Onset   Hypertension Mother    Stroke Mother    Stroke Father    Hypertension Father    Hypertension Sister    Kidney disease Sister    Breast cancer Neg Hx     Allergies  Allergen Reactions   Macrobid  [Nitrofurantoin ] Nausea Only    Pts son wanted noted that macrobid  caused bad nausea   Sulfamethoxazole-Trimethoprim     Other reaction(s): Not available   Penicillins Rash    Current Outpatient Medications on File Prior to Visit  Medication Sig Dispense Refill   amLODipine  (NORVASC ) 10 MG tablet TAKE ONE TABLET BY MOUTH ONCE DAILY FOR BLOOD PRESSURE 90 tablet 2   ascorbic acid (VITAMIN C) 500 MG tablet Take 500 mg  by mouth daily.     Calcium -Magnesium -Vitamin D  500-250-125 MG-MG-UNIT TABS Take 1 tablet by mouth daily.     cloNIDine  (CATAPRES ) 0.1 MG tablet Take 1 tablet by mouth once daily as needed for systolic blood pressure greater than 170. 30 tablet 0   co-enzyme Q-10 30 MG capsule Take 30 mg by mouth 3 (three) times daily.     glucose blood (ONE TOUCH ULTRA TEST) test strip USE AS DIRECTED TEST 3 TIMES DAILY E11.9 100 each 2   irbesartan  (AVAPRO ) 300 MG tablet TAKE ONE  TABLET BY MOUTH ONCE DAILY FOR BLOOD PRESSURE 90 tablet 2   metFORMIN  (GLUCOPHAGE ) 1000 MG tablet TAKE 1 TABLET BY MOUTH TWICE DAILY WITH A MEAL FORT DIABETES 180 tablet 0   metoprolol  succinate (TOPROL -XL) 25 MG 24 hr tablet TAKE ONE TABLET BY MOUTH TWICE DAILY FOR blood pressure. 180 tablet 2   Omega-3 Fatty Acids (FISH OIL) 300 MG CAPS Take by mouth.     prednisoLONE acetate (PRED FORTE) 1 % ophthalmic suspension Place 1 drop into the left eye 4 (four) times daily.     Vitamin D , Ergocalciferol , (DRISDOL ) 1.25 MG (50000 UNIT) CAPS capsule TAKE 1 CAPSULE BY MOUTH ONCE WEEKLY 12 capsule 0   gatifloxacin (ZYMAXID) 0.5 % SOLN Place 1 drop into the left eye 4 (four) times daily. (Patient not taking: Reported on 08/16/2023)     ketorolac (ACULAR) 0.5 % ophthalmic solution Place 1 drop into the right eye 4 (four) times daily. (Patient not taking: Reported on 08/16/2023)     Turmeric (QC TUMERIC COMPLEX) 500 MG CAPS Take by mouth. (Patient not taking: Reported on 08/16/2023)     No current facility-administered medications on file prior to visit.    BP 136/82   Pulse 75   Temp (!) 97.2 F (36.2 C) (Temporal)   Ht 5' 5 (1.651 m)   Wt 120 lb (54.4 kg)   SpO2 98%   BMI 19.97 kg/m  Objective:   Physical Exam Cardiovascular:     Rate and Rhythm: Normal rate and regular rhythm.  Pulmonary:     Effort: Pulmonary effort is normal.     Breath sounds: Normal breath sounds.  Musculoskeletal:     Cervical back: Neck supple.     Thoracic  back: Tenderness present. No bony tenderness. Decreased range of motion.     Lumbar back: Tenderness present. No bony tenderness. Normal range of motion.       Back:     Comments: Tender over right lower ribs upon palpation  Skin:    General: Skin is warm and dry.  Neurological:     Mental Status: She is alert and oriented to person, place, and time.  Psychiatric:        Mood and Affect: Mood normal.           Assessment & Plan:  Type 2 diabetes mellitus with other specified complication, without long-term current use of insulin  (HCC) Assessment & Plan: Deteriorated with A1c of 8.8 today.  Discussed to limit candy, macaroon's, Fritos chips. Protein, vegetables.  Increase glimepiride  to 4 mg twice daily. Continue metformin  1000 mg twice daily.  I have asked that she start checking her blood sugars to help monitor.  Close follow-up in 3 months.  Orders: -     POCT glycosylated hemoglobin (Hb A1C) -     Glimepiride ; Take 1 tablet (4 mg total) by mouth 2 (two) times daily. for diabetes.  Acute right-sided thoracic back pain Assessment & Plan: Since fall.  Given recent fall, coupled with osteoporosis history, will obtain plain films of the ribs/chest area.   Orders: -     DG Ribs Unilateral W/Chest Right -     DG Thoracic Spine 2 View  Acute right-sided low back pain without sciatica Assessment & Plan: Since fall.  Will obtain plain films of the lumbar spine given recent fall coupled with osteoporosis history.  Orders: -     DG Lumbar Spine 2-3 Views        Comer MARLA Gaskins, NP

## 2023-08-16 NOTE — Assessment & Plan Note (Signed)
 Since fall.  Given recent fall, coupled with osteoporosis history, will obtain plain films of the ribs/chest area.

## 2023-08-16 NOTE — Patient Instructions (Signed)
 Complete xray(s) prior to leaving today. I will notify you of your results once received.  We increased the dose of your glimepiride .  Take 1 full pill twice daily.  Work on your snacking as discussed.  Start checking your blood sugars as discussed.  Please schedule a follow up visit for 3 months for diabetes.  It was a pleasure to see you today!

## 2023-08-16 NOTE — Telephone Encounter (Signed)
 I spoke with pt and pt has appt with Allayne Gitelman NP 08/16/23 at 11:20. Pt voiced understanding and nothing further needed.

## 2023-08-16 NOTE — Assessment & Plan Note (Signed)
 Since fall.  Will obtain plain films of the lumbar spine given recent fall coupled with osteoporosis history.

## 2023-08-16 NOTE — Telephone Encounter (Signed)
 Morgan Bowen

## 2023-08-17 ENCOUNTER — Other Ambulatory Visit: Payer: Self-pay | Admitting: Primary Care

## 2023-08-17 DIAGNOSIS — M546 Pain in thoracic spine: Secondary | ICD-10-CM

## 2023-08-17 DIAGNOSIS — M545 Low back pain, unspecified: Secondary | ICD-10-CM

## 2023-08-17 MED ORDER — TRAMADOL HCL 50 MG PO TABS
50.0000 mg | ORAL_TABLET | Freq: Two times a day (BID) | ORAL | 0 refills | Status: AC | PRN
Start: 2023-08-17 — End: 2023-08-22

## 2023-08-30 ENCOUNTER — Ambulatory Visit: Payer: Medicare Other | Admitting: Cardiovascular Disease

## 2023-09-02 ENCOUNTER — Ambulatory Visit (INDEPENDENT_AMBULATORY_CARE_PROVIDER_SITE_OTHER): Payer: Medicare Other | Admitting: Podiatry

## 2023-09-02 DIAGNOSIS — Z91199 Patient's noncompliance with other medical treatment and regimen due to unspecified reason: Secondary | ICD-10-CM

## 2023-09-02 NOTE — Progress Notes (Signed)
1. No-show for appointment

## 2023-09-04 NOTE — Progress Notes (Deleted)
   Cardiology Clinic Note   Date: 09/04/2023 ID: Morgan Bowen, DOB January 14, 1936, MRN [id number]  Primary Cardiologist:  None  Patient Profile    Morgan Bowen is a 88 y.o. female who presents to the clinic today for ***    Past medical history significant for: Palpitations. Hypertension. Hyperlipidemia. Lipid panel 05/11/2023: LDL 98, HDL 49, TG 164, total 180. T2DM. DVT.  In summary, patient was first evaluated by Dr. Mariah Milling on 05/31/2023 for palpitations as requested by [name], NP.  Patient reported history of paroxysmal tachycardia with heart rate 140 to 160 bpm lasting 15 to 20 minutes and occurs less than once a month.  It was recommended she increase Toprol to twice daily dosing.     History of Present Illness    Morgan Bowen is followed by Dr. Mariah Milling for the above outlined history.   Today, patient ***  Palpitations Patient*** EKG*** -Continue Toprol.  Hypertension BP today*** -Continue amlodipine, clonidine as needed, irbesartan, Toprol.   ROS: All other systems reviewed and are otherwise negative except as noted in History of Present Illness.  EKGs/Labs Reviewed        05/11/2023: ALT 14; AST 16; BUN 14; Creatinine, Ser 0.99; Potassium 4.6; Sodium 132   No results found for requested labs within last 365 days.   No results found for requested labs within last 365 days.   No results found for requested labs within last 365 days.  ***  Risk Assessment/Calculations    {Does this patient have ATRIAL FIBRILLATION?[phone]} No BP recorded.  {Refresh Note OR Click here to enter BP  :1}***        Physical Exam    VS:  There were no vitals taken for this visit. , BMI There is no height or weight on file to calculate BMI.  GEN: Well nourished, well developed, in no acute distress. Neck: No JVD or carotid bruits. Cardiac: *** RRR. No murmurs. No rubs or gallops.   Respiratory:  Respirations regular and unlabored. Clear to auscultation without rales,  wheezing or rhonchi. GI: Soft, nontender, nondistended. Extremities: Radials/DP/PT 2+ and equal bilaterally. No clubbing or cyanosis. No edema ***  Skin: Warm and dry, no rash. Neuro: Strength intact.  Assessment & Plan   ***  Disposition: ***     {Are you ordering a CV Procedure (e.g. stress test, cath, DCCV, TEE, etc)?   Press F2        :[id number]}   Signed, Etta Grandchild. Mukund Weinreb, DNP, NP-C

## 2023-09-06 ENCOUNTER — Ambulatory Visit: Payer: Medicare Other | Attending: Student | Admitting: Student

## 2023-10-01 ENCOUNTER — Other Ambulatory Visit: Payer: Self-pay | Admitting: Primary Care

## 2023-10-01 DIAGNOSIS — E119 Type 2 diabetes mellitus without complications: Secondary | ICD-10-CM

## 2023-10-03 ENCOUNTER — Other Ambulatory Visit: Payer: Self-pay | Admitting: Primary Care

## 2023-10-03 DIAGNOSIS — E1169 Type 2 diabetes mellitus with other specified complication: Secondary | ICD-10-CM

## 2023-10-26 ENCOUNTER — Other Ambulatory Visit: Payer: Self-pay

## 2023-10-26 ENCOUNTER — Ambulatory Visit
Admission: EM | Admit: 2023-10-26 | Discharge: 2023-10-26 | Disposition: A | Discharge status: Home / Self Care | Attending: Emergency Medicine | Admitting: Emergency Medicine

## 2023-10-26 ENCOUNTER — Encounter: Payer: Self-pay | Admitting: Emergency Medicine

## 2023-10-26 DIAGNOSIS — R300 Dysuria: Secondary | ICD-10-CM | POA: Diagnosis not present

## 2023-10-26 LAB — POCT URINALYSIS DIP (MANUAL ENTRY)
Bilirubin, UA: NEGATIVE
Glucose, UA: 500 mg/dL — AB
Nitrite, UA: NEGATIVE
Protein Ur, POC: 100 mg/dL — AB
Spec Grav, UA: 1.02
Urobilinogen, UA: 0.2 U/dL
pH, UA: 5.5

## 2023-10-26 MED ORDER — CEPHALEXIN 500 MG PO CAPS: 500.0000 mg | ORAL_CAPSULE | Freq: Two times a day (BID) | ORAL | 0 refills | Status: AC

## 2023-10-26 NOTE — Discharge Instructions (Addendum)
 Your urinalysis is not currently show bacteria, your urine will be sent to the lab to determine exactly which bacteria is present, if any changes need to be made to your medications you will be notified  Begin use of cephalexin every morning and every evening for 5 days  You may use over-the-counter Azo to help minimize your symptoms until antibiotic removes bacteria, this medication will turn your urine orange  Increase your fluid intake through use of water  As always practice good hygiene, wiping front to back and avoidance of scented vaginal products to prevent further irritation  If symptoms continue to persist after use of medication or recur please follow-up with urgent care or your primary doctor as needed

## 2023-10-26 NOTE — ED Provider Notes (Signed)
 Renaldo Fiddler    CSN: [id number] Arrival date & time: 10/26/23  1205      History   Chief Complaint Chief Complaint  Patient presents with   Dysuria    HPI Morgan Bowen is a 88 y.o. female.   Patient presents for evaluation of urinary frequency and dysuria present for 2 days.  Has attempted use of vinegar water which has been ineffective.  Denies hematuria, abdominal or flank pain, fever or vaginal symptoms.  Past Medical History:  Diagnosis Date   Acute cystitis 05/23/2018   Acute deep vein thrombosis (DVT) of right tibial vein (HCC) 04/03/2020   Anxiety    Arthritis    Bronchitis 06/17/2015   Cellulitis 12/13/2018   Chickenpox    COVID-19 virus infection 06/24/2022   Diabetes mellitus without complication (HCC)    Dizziness 01/15/2022   Fall 12/10/2022   GERD (gastroesophageal reflux disease)    Hypertension    Laceration of skin and cutaneous sensory nerve of left upper extremity 04/13/2022   Osteoporosis    Type 2 diabetes mellitus (HCC)    Urinary tract infection     Patient Active Problem List   Diagnosis Date Noted   Acute right-sided thoracic back pain 08/16/2023   Acute right-sided low back pain without sciatica 08/16/2023   Dysuria 05/11/2023   Hyperlipidemia 05/11/2023   Pain of left lower extremity 01/05/2023   Acute pain of left knee 01/05/2023   Imbalance 12/10/2022   Acute extremity pain 12/10/2022   Neck pain 04/13/2022   Chronic foot pain 04/13/2022   Pain in joint of right knee 12/31/2021   Sinus tachycardia 06/16/2021   Blood coagulation defect (HCC) 12/18/2020   Palpitations 04/21/2020   Pain due to onychomycosis of toenails of both feet 01/22/2019   Vitamin D deficiency 06/26/2018   Decreased renal function 03/21/2018   Non-traumatic compression fracture of lumbar vertebra with routine healing 08/26/2017   Anxiety and depression 08/26/2017   Type 2 diabetes mellitus with other specified complication (HCC) 04/27/2017    Essential hypertension 04/27/2017   Osteoporosis 04/27/2017    Past Surgical History:  Procedure Laterality Date   ABDOMINAL HYSTERECTOMY  1984   ABDOMINAL HYSTERECTOMY     APPENDECTOMY  1958   CATARACT EXTRACTION Right 04/25/2023   CHOLECYSTECTOMY     GALLBLADDER SURGERY     MANDIBLE FRACTURE SURGERY  07/13/2016   TONSILLECTOMY  1958   TONSILLECTOMY     WRIST FRACTURE SURGERY Right    2000    OB History   No obstetric history on file.      Home Medications    Prior to Admission medications   Medication Sig Start Date End Date Taking? Authorizing Provider  cephALEXin (KEFLEX) 500 MG capsule Take 1 capsule (500 mg total) by mouth 2 (two) times daily for 5 days. 10/26/23 10/31/23 Yes Glenette Bookwalter R, NP  amLODipine (NORVASC) 10 MG tablet TAKE ONE TABLET BY MOUTH ONCE DAILY FOR BLOOD PRESSURE 03/18/23   Doreene Nest, NP  ascorbic acid (VITAMIN C) 500 MG tablet Take 500 mg by mouth daily.    [provider]  Calcium-Magnesium-Vitamin D [phone] MG-MG-UNIT TABS Take 1 tablet by mouth daily.    [provider]  cloNIDine (CATAPRES) 0.1 MG tablet Take 1 tablet by mouth once daily as needed for systolic blood pressure greater than 170. 10/24/20   Doreene Nest, NP  co-enzyme Q-10 30 MG capsule Take 30 mg by mouth 3 (three) times daily.  [provider]  gatifloxacin (ZYMAXID) 0.5 % SOLN Place 1 drop into the left eye 4 (four) times daily. Patient not taking: Reported on 08/16/2023 04/26/23   [provider]  glimepiride (AMARYL) 4 MG tablet Take 1 tablet (4 mg total) by mouth 2 (two) times daily. for diabetes. 10/04/23   Doreene Nest, NP  glucose blood (ONE TOUCH ULTRA TEST) test strip USE AS DIRECTED TEST 3 TIMES DAILY E11.9 01/18/19   Doreene Nest, NP  irbesartan (AVAPRO) 300 MG tablet TAKE ONE TABLET BY MOUTH ONCE DAILY FOR BLOOD PRESSURE 03/18/23   Doreene Nest, NP  ketorolac (ACULAR) 0.5 % ophthalmic solution Place 1  drop into the right eye 4 (four) times daily. Patient not taking: Reported on 08/16/2023 04/26/23   [provider]  metFORMIN (GLUCOPHAGE) 1000 MG tablet TAKE 1 TABLET BY MOUTH TWICE DAILY WITH A MEAL FORT DIABETES 08/04/23   Doreene Nest, NP  metoprolol succinate (TOPROL-XL) 25 MG 24 hr tablet TAKE ONE TABLET BY MOUTH TWICE DAILY FOR blood pressure. 05/31/23   Antonieta Iba, MD  Omega-3 Fatty Acids (FISH OIL) 300 MG CAPS Take by mouth.    [provider]  prednisoLONE acetate (PRED FORTE) 1 % ophthalmic suspension Place 1 drop into the left eye 4 (four) times daily. 04/26/23   [provider]  Turmeric (QC TUMERIC COMPLEX) 500 MG CAPS Take by mouth. Patient not taking: Reported on 08/16/2023    [provider]  Vitamin D, Ergocalciferol, (DRISDOL) 1.25 MG (50000 UNIT) CAPS capsule TAKE 1 CAPSULE BY MOUTH ONCE WEEKLY 08/04/23   Doreene Nest, NP    Family History Family History  Problem Relation Age of Onset   Hypertension Mother    Stroke Mother    Stroke Father    Hypertension Father    Hypertension Sister    Kidney disease Sister    Breast cancer Neg Hx     Social History Social History   Tobacco Use   Smoking status: Never   Smokeless tobacco: Never  Vaping Use   Vaping status: Never Used  Substance Use Topics   Alcohol use: No   Drug use: No     Allergies   Macrobid [nitrofurantoin], Sulfamethoxazole-trimethoprim, and Penicillins   Review of Systems Review of Systems  Genitourinary:  Positive for dysuria.     Physical Exam Triage Vital Signs ED Triage Vitals [10/26/23 1231]  Encounter Vitals Group     BP (!) 173/71     Systolic BP Percentile      Diastolic BP Percentile      Pulse Rate (!) 113     Resp 18     Temp 97.7 F (36.5 C)     Temp Source Oral     SpO2 96 %     Weight      Height      Head Circumference      Peak Flow      Pain Score 0     Pain Loc      Pain Education      Exclude from Growth  Chart    No data found.  Updated Vital Signs BP (!) 173/71 (BP Location: Left Arm)   Pulse (!) 113   Temp 97.7 F (36.5 C) (Oral)   Resp 18   SpO2 96%   Visual Acuity Right Eye Distance:   Left Eye Distance:   Bilateral Distance:    Right Eye Near:   Left Eye Near:  Bilateral Near:     Physical Exam Constitutional:      Appearance: Normal appearance.  HENT:     Head: Normocephalic.  Abdominal:     Tenderness: There is no abdominal tenderness. There is no right CVA tenderness, left CVA tenderness or guarding.  Neurological:     Mental Status: She is alert and oriented to person, place, and time. Mental status is at baseline.      UC Treatments / Results  Labs (all labs ordered are listed, but only abnormal results are displayed) Labs Reviewed  POCT URINALYSIS DIP (MANUAL ENTRY) - Abnormal; Notable for the following components:      Result Value   Clarity, UA cloudy (*)    Glucose, UA =500 (*)    Ketones, POC UA trace (5) (*)    Blood, UA small (*)    Protein Ur, POC =100 (*)    Leukocytes, UA Large (3+) (*)    All other components within normal limits  URINE CULTURE    EKG   Radiology No results found.  Procedures Procedures (including critical care time)  Medications Ordered in UC Medications - No data to display  Initial Impression / Assessment and Plan / UC Course  I have reviewed the triage vital signs and the nursing notes.  Pertinent labs & imaging results that were available during my care of the patient were reviewed by me and considered in my medical decision making (see chart for details).  Dysuria  Urinalysis showing leukocytes, negative for nitrates, sent for culture, prophylactically initiating antibiotics that she is symptomatic, prescribed cephalexin, recommended supportive care and advised follow-up as needed Final Clinical Impressions(s) / UC Diagnoses   Final diagnoses:  Dysuria     Discharge Instructions      Your  urinalysis is not currently show bacteria, your urine will be sent to the lab to determine exactly which bacteria is present, if any changes need to be made to your medications you will be notified  Begin use of cephalexin every morning and every evening for 5 days  You may use over-the-counter Azo to help minimize your symptoms until antibiotic removes bacteria, this medication will turn your urine orange  Increase your fluid intake through use of water  As always practice good hygiene, wiping front to back and avoidance of scented vaginal products to prevent further irritation  If symptoms continue to persist after use of medication or recur please follow-up with urgent care or your primary doctor as needed    ED Prescriptions     Medication Sig Dispense Auth. Provider   cephALEXin (KEFLEX) 500 MG capsule Take 1 capsule (500 mg total) by mouth 2 (two) times daily for 5 days. 10 capsule Valinda Hoar, NP      PDMP not reviewed this encounter.   Valinda Hoar, NP 10/26/23 1250

## 2023-10-26 NOTE — ED Triage Notes (Signed)
 Patient presents to [facility] for evaluation of 2 days of urinary frequency and occasional burning with urination.  Denies fevers, denies abdominal pain

## 2023-10-28 LAB — URINE CULTURE: Culture: 40000 — AB

## 2023-11-01 ENCOUNTER — Other Ambulatory Visit: Payer: Self-pay | Admitting: Primary Care

## 2023-11-01 DIAGNOSIS — E119 Type 2 diabetes mellitus without complications: Secondary | ICD-10-CM

## 2023-11-01 DIAGNOSIS — I10 Essential (primary) hypertension: Secondary | ICD-10-CM

## 2023-11-01 DIAGNOSIS — M810 Age-related osteoporosis without current pathological fracture: Secondary | ICD-10-CM

## 2023-11-06 ENCOUNTER — Emergency Department

## 2023-11-06 ENCOUNTER — Other Ambulatory Visit: Payer: Self-pay

## 2023-11-06 ENCOUNTER — Emergency Department
Admission: EM | Admit: 2023-11-06 | Discharge: 2023-11-06 | Disposition: A | Discharge status: Home / Self Care | Attending: Emergency Medicine | Admitting: Emergency Medicine

## 2023-11-06 ENCOUNTER — Encounter: Payer: Self-pay | Admitting: Emergency Medicine

## 2023-11-06 DIAGNOSIS — M419 Scoliosis, unspecified: Secondary | ICD-10-CM | POA: Diagnosis not present

## 2023-11-06 DIAGNOSIS — S59202A Unspecified physeal fracture of lower end of radius, left arm, initial encounter for closed fracture: Secondary | ICD-10-CM | POA: Diagnosis not present

## 2023-11-06 DIAGNOSIS — S52612A Displaced fracture of left ulna styloid process, initial encounter for closed fracture: Secondary | ICD-10-CM | POA: Insufficient documentation

## 2023-11-06 DIAGNOSIS — R112 Nausea with vomiting, unspecified: Secondary | ICD-10-CM | POA: Diagnosis not present

## 2023-11-06 DIAGNOSIS — R531 Weakness: Secondary | ICD-10-CM | POA: Diagnosis not present

## 2023-11-06 DIAGNOSIS — Y92810 Car as the place of occurrence of the external cause: Secondary | ICD-10-CM | POA: Diagnosis not present

## 2023-11-06 DIAGNOSIS — S065XAA Traumatic subdural hemorrhage with loss of consciousness status unknown, initial encounter: Secondary | ICD-10-CM | POA: Diagnosis not present

## 2023-11-06 DIAGNOSIS — E119 Type 2 diabetes mellitus without complications: Secondary | ICD-10-CM | POA: Diagnosis not present

## 2023-11-06 DIAGNOSIS — M545 Low back pain, unspecified: Secondary | ICD-10-CM | POA: Diagnosis not present

## 2023-11-06 DIAGNOSIS — S52532A Colles' fracture of left radius, initial encounter for closed fracture: Secondary | ICD-10-CM | POA: Insufficient documentation

## 2023-11-06 DIAGNOSIS — S6992XA Unspecified injury of left wrist, hand and finger(s), initial encounter: Secondary | ICD-10-CM | POA: Diagnosis present

## 2023-11-06 DIAGNOSIS — Z66 Do not resuscitate: Secondary | ICD-10-CM | POA: Diagnosis not present

## 2023-11-06 DIAGNOSIS — S62102A Fracture of unspecified carpal bone, left wrist, initial encounter for closed fracture: Secondary | ICD-10-CM | POA: Diagnosis not present

## 2023-11-06 DIAGNOSIS — Z7984 Long term (current) use of oral hypoglycemic drugs: Secondary | ICD-10-CM | POA: Diagnosis not present

## 2023-11-06 DIAGNOSIS — G319 Degenerative disease of nervous system, unspecified: Secondary | ICD-10-CM | POA: Diagnosis not present

## 2023-11-06 DIAGNOSIS — Z8249 Family history of ischemic heart disease and other diseases of the circulatory system: Secondary | ICD-10-CM | POA: Diagnosis not present

## 2023-11-06 DIAGNOSIS — G9341 Metabolic encephalopathy: Secondary | ICD-10-CM | POA: Diagnosis not present

## 2023-11-06 DIAGNOSIS — R569 Unspecified convulsions: Secondary | ICD-10-CM | POA: Diagnosis not present

## 2023-11-06 DIAGNOSIS — F419 Anxiety disorder, unspecified: Secondary | ICD-10-CM | POA: Diagnosis not present

## 2023-11-06 DIAGNOSIS — W19XXXA Unspecified fall, initial encounter: Secondary | ICD-10-CM | POA: Diagnosis not present

## 2023-11-06 DIAGNOSIS — M25532 Pain in left wrist: Secondary | ICD-10-CM | POA: Diagnosis not present

## 2023-11-06 DIAGNOSIS — M542 Cervicalgia: Secondary | ICD-10-CM | POA: Diagnosis not present

## 2023-11-06 DIAGNOSIS — I6501 Occlusion and stenosis of right vertebral artery: Secondary | ICD-10-CM | POA: Diagnosis not present

## 2023-11-06 DIAGNOSIS — I700 Atherosclerosis of aorta: Secondary | ICD-10-CM | POA: Diagnosis not present

## 2023-11-06 DIAGNOSIS — E876 Hypokalemia: Secondary | ICD-10-CM | POA: Diagnosis not present

## 2023-11-06 DIAGNOSIS — D513 Other dietary vitamin B12 deficiency anemia: Secondary | ICD-10-CM | POA: Diagnosis not present

## 2023-11-06 DIAGNOSIS — G928 Other toxic encephalopathy: Secondary | ICD-10-CM | POA: Diagnosis not present

## 2023-11-06 DIAGNOSIS — S52572A Other intraarticular fracture of lower end of left radius, initial encounter for closed fracture: Secondary | ICD-10-CM | POA: Diagnosis not present

## 2023-11-06 DIAGNOSIS — R4182 Altered mental status, unspecified: Secondary | ICD-10-CM | POA: Diagnosis not present

## 2023-11-06 DIAGNOSIS — I6523 Occlusion and stenosis of bilateral carotid arteries: Secondary | ICD-10-CM | POA: Diagnosis not present

## 2023-11-06 DIAGNOSIS — S52502A Unspecified fracture of the lower end of left radius, initial encounter for closed fracture: Secondary | ICD-10-CM | POA: Diagnosis not present

## 2023-11-06 DIAGNOSIS — D519 Vitamin B12 deficiency anemia, unspecified: Secondary | ICD-10-CM | POA: Diagnosis not present

## 2023-11-06 DIAGNOSIS — D509 Iron deficiency anemia, unspecified: Secondary | ICD-10-CM | POA: Diagnosis not present

## 2023-11-06 DIAGNOSIS — G459 Transient cerebral ischemic attack, unspecified: Secondary | ICD-10-CM | POA: Diagnosis not present

## 2023-11-06 DIAGNOSIS — R739 Hyperglycemia, unspecified: Secondary | ICD-10-CM | POA: Diagnosis not present

## 2023-11-06 DIAGNOSIS — R29810 Facial weakness: Secondary | ICD-10-CM | POA: Diagnosis not present

## 2023-11-06 DIAGNOSIS — I6203 Nontraumatic chronic subdural hemorrhage: Secondary | ICD-10-CM | POA: Diagnosis not present

## 2023-11-06 DIAGNOSIS — K8689 Other specified diseases of pancreas: Secondary | ICD-10-CM | POA: Diagnosis not present

## 2023-11-06 DIAGNOSIS — M47816 Spondylosis without myelopathy or radiculopathy, lumbar region: Secondary | ICD-10-CM | POA: Diagnosis not present

## 2023-11-06 DIAGNOSIS — E1165 Type 2 diabetes mellitus with hyperglycemia: Secondary | ICD-10-CM | POA: Diagnosis not present

## 2023-11-06 DIAGNOSIS — S199XXA Unspecified injury of neck, initial encounter: Secondary | ICD-10-CM | POA: Diagnosis not present

## 2023-11-06 DIAGNOSIS — K909 Intestinal malabsorption, unspecified: Secondary | ICD-10-CM | POA: Diagnosis not present

## 2023-11-06 DIAGNOSIS — E1169 Type 2 diabetes mellitus with other specified complication: Secondary | ICD-10-CM | POA: Diagnosis not present

## 2023-11-06 DIAGNOSIS — I10 Essential (primary) hypertension: Secondary | ICD-10-CM | POA: Diagnosis not present

## 2023-11-06 DIAGNOSIS — E871 Hypo-osmolality and hyponatremia: Secondary | ICD-10-CM | POA: Diagnosis not present

## 2023-11-06 DIAGNOSIS — Z8616 Personal history of COVID-19: Secondary | ICD-10-CM | POA: Diagnosis not present

## 2023-11-06 DIAGNOSIS — Z79899 Other long term (current) drug therapy: Secondary | ICD-10-CM | POA: Diagnosis not present

## 2023-11-06 DIAGNOSIS — I672 Cerebral atherosclerosis: Secondary | ICD-10-CM | POA: Diagnosis not present

## 2023-11-06 DIAGNOSIS — I2510 Atherosclerotic heart disease of native coronary artery without angina pectoris: Secondary | ICD-10-CM | POA: Diagnosis not present

## 2023-11-06 DIAGNOSIS — S0990XA Unspecified injury of head, initial encounter: Secondary | ICD-10-CM | POA: Diagnosis not present

## 2023-11-06 DIAGNOSIS — R29818 Other symptoms and signs involving the nervous system: Secondary | ICD-10-CM | POA: Diagnosis not present

## 2023-11-06 DIAGNOSIS — R197 Diarrhea, unspecified: Secondary | ICD-10-CM | POA: Diagnosis not present

## 2023-11-06 DIAGNOSIS — S52352A Displaced comminuted fracture of shaft of radius, left arm, initial encounter for closed fracture: Secondary | ICD-10-CM | POA: Diagnosis not present

## 2023-11-06 DIAGNOSIS — E785 Hyperlipidemia, unspecified: Secondary | ICD-10-CM | POA: Diagnosis not present

## 2023-11-06 DIAGNOSIS — M549 Dorsalgia, unspecified: Secondary | ICD-10-CM | POA: Diagnosis not present

## 2023-11-06 DIAGNOSIS — M4856XA Collapsed vertebra, not elsewhere classified, lumbar region, initial encounter for fracture: Secondary | ICD-10-CM | POA: Diagnosis not present

## 2023-11-06 DIAGNOSIS — R4781 Slurred speech: Secondary | ICD-10-CM | POA: Diagnosis not present

## 2023-11-06 DIAGNOSIS — F32A Depression, unspecified: Secondary | ICD-10-CM | POA: Diagnosis not present

## 2023-11-06 LAB — CBG MONITORING, ED: Glucose-Capillary: 202 mg/dL — ABNORMAL HIGH (ref 70–99)

## 2023-11-06 MED ORDER — LIDOCAINE HCL (PF) 1 % IJ SOLN
5.0000 mL | Freq: Once | INTRAMUSCULAR | Status: AC
  Administered 2023-11-06: 5 mL via INTRADERMAL
  Filled 2023-11-06: qty 5

## 2023-11-06 MED ORDER — TRAMADOL HCL 50 MG PO TABS: 50.0000 mg | ORAL_TABLET | Freq: Three times a day (TID) | ORAL | 0 refills | Status: DC | PRN

## 2023-11-06 MED ORDER — ONDANSETRON HCL 4 MG/2ML IJ SOLN
4.0000 mg | Freq: Once | INTRAMUSCULAR | Status: AC
  Administered 2023-11-06: 4 mg via INTRAVENOUS

## 2023-11-06 MED ORDER — TRAMADOL HCL 50 MG PO TABS
50.0000 mg | ORAL_TABLET | Freq: Once | ORAL | Status: AC
  Administered 2023-11-06: 50 mg via ORAL
  Filled 2023-11-06: qty 1

## 2023-11-06 MED ORDER — FENTANYL CITRATE PF 50 MCG/ML IJ SOSY
50.0000 ug | PREFILLED_SYRINGE | Freq: Once | INTRAMUSCULAR | Status: AC
  Administered 2023-11-06: 50 ug via INTRAVENOUS
  Filled 2023-11-06: qty 1

## 2023-11-06 NOTE — Discharge Instructions (Signed)
 Take Tylenol every 6 hours. If Tylenol is not helping with the pain, you may take tramadol. Please call and schedule follow-up appointment with orthopedics. If  your pain gets worse even with medication, please see your primary care provider, the orthopedic specialist, or return to the emergency department.

## 2023-11-06 NOTE — ED Triage Notes (Signed)
 Patient to ED via [facility] from home. PT fell getting out of her car. Deformity to left arm. Skin tear to right wrist. C/o of back pain- chronic. Hx HTN, diabetes. Denies blood thinners.   20g R forearm- given NaCl 193/79 326 cbg 118 HR 98% RA

## 2023-11-06 NOTE — ED Notes (Signed)
 NP at bedside performing hematoma block and left wrist reduction

## 2023-11-06 NOTE — ED Provider Notes (Signed)
 [facility] Provider Note    Event Date/Time   First MD Initiated Contact with Patient 11/06/23 1701     (approximate)   History   Fall   HPI  Morgan Bowen is a 88 y.o. female  with history of DM II, osteoporosis, DVT,  and as listed in EMR presents to the emergency department for treatment and evaluation of neck pain, left wrist pain, lower back, and coccyx pain after falling backward while getting out of the car. She states there was a slight incline that was just enough to cause her to be off balance. She landed on the left outstretched hand, then on her bottom.      Physical Exam   Triage Vital Signs: ED Triage Vitals  Encounter Vitals Group     BP 11/06/23 1650 (!) 179/73     Systolic BP Percentile --      Diastolic BP Percentile --      Pulse Rate 11/06/23 1650 (!) 110     Resp 11/06/23 1650 18     Temp 11/06/23 1650 98 F (36.7 C)     Temp Source 11/06/23 1650 Oral     SpO2 11/06/23 1650 100 %     Weight 11/06/23 1651 119 lb 0.8 oz (54 kg)     Height 11/06/23 1651 5\' 5"  (1.651 m)     Head Circumference --      Peak Flow --      Pain Score 11/06/23 1651 10     Pain Loc --      Pain Education --      Exclude from Growth Chart --     Most recent vital signs: Vitals:   11/06/23 1918 11/06/23 2128  BP: (!) 156/69 139/78  Pulse: 94 92  Resp: 16   Temp: 98.1 F (36.7 C) 98.3 F (36.8 C)  SpO2: 98% 100%    General: Awake, no distress.  CV:  Good peripheral perfusion.  Resp:  Normal effort.  Abd:  No distention.  Other:  Right wrist superficial skin tear. Deformity of the left wrist without open lesion.   ED Results / Procedures / Treatments   Labs (all labs ordered are listed, but only abnormal results are displayed) Labs Reviewed  CBG MONITORING, ED - Abnormal; Notable for the following components:      Result Value   Glucose-Capillary 202 (*)    All other components within normal limits     EKG  Not  indicated   RADIOLOGY  Image and radiology report reviewed and interpreted by me. Radiology report consistent with the same.  CT head and cervical spine negative for acute concerns.  X-ray of the lumbar spine negative for acute bony abnormality.  X-ray of the left wrist shows posteriorly displaced angulated distal left radial and ulnar styloid fractures.  PROCEDURES:  Critical Care performed: No  .Reduction of fracture  Date/Time: 11/06/2023 11:46 PM  Performed by: Chinita Pester, FNP Authorized by: Chinita Pester, FNP  Consent: Verbal consent obtained. Risks and benefits: risks, benefits and alternatives were discussed Consent given by: patient Imaging studies: imaging studies available Local anesthesia used: yes Anesthesia: hematoma block  Anesthesia: Local anesthesia used: yes Local Anesthetic: lidocaine 1% without epinephrine  Sedation: Patient sedated: no  Patient tolerance: patient tolerated the procedure well with no immediate complications Comments: Finger traps and 10 lb. weight used.  With manipulation reduction seemed successful and OCL applied.  Motor and sensory function of the fingers  intact post splint.   Marland KitchenSplint Application  Date/Time: 11/06/2023 11:50 PM  Performed by: Chinita Pester, FNP Authorized by: Chinita Pester, FNP   Consent:    Consent obtained:  Verbal   Consent given by:  Patient   Risks discussed:  Numbness and pain Universal protocol:    Patient identity confirmed:  Verbally with patient Pre-procedure details:    Distal neurologic exam:  Normal   Distal perfusion: distal pulses strong and brisk capillary refill   Procedure details:    Location:  Wrist   Wrist location:  L wrist   Upper extremity cast type: Sugar-tong.   Splint type:  Sugar tong   Supplies:  Elastic bandage, cotton padding and fiberglass   Attestation: Splint applied and adjusted personally by me   Post-procedure details:    Distal neurologic exam:   Normal   Distal perfusion: brisk capillary refill and unchanged     Procedure completion:  Tolerated well, no immediate complications   Post-procedure imaging: reviewed      MEDICATIONS ORDERED IN ED:  Medications  lidocaine (PF) (XYLOCAINE) 1 % injection 5 mL (5 mLs Intradermal Given 11/06/23 1917)  ondansetron (ZOFRAN) injection 4 mg (4 mg Intravenous Given 11/06/23 1857)  fentaNYL (SUBLIMAZE) injection 50 mcg (50 mcg Intravenous Given 11/06/23 1923)  traMADol (ULTRAM) tablet 50 mg (50 mg Oral Given 11/06/23 2110)     IMPRESSION / MDM / ASSESSMENT AND PLAN / ED COURSE   I have reviewed the triage note.  Differential diagnosis includes, but is not limited to, left wrist Colles' fracture, ICH, concussion, cervical spine injury, vertebral injury, coccyx injury.  Patient's presentation is most consistent with acute presentation with potential threat to life or bodily function.  88 year old female presenting to the emergency department after falling backward while getting out of the car.  She tried to catch herself with her left hand and landed on her buttocks.  She believes that she also hit her head but denies any loss of consciousness.  Left wrist was splinted by EMS who report obvious deformity but no open wounds on that side.  She has a superficial skin tear to the right wrist without active bleeding.  Image of the left wrist shows a posterior to the displaced and angulated distal left radial and ulnar styloid fracture.  CT of the head and cervical spine are pending.  X-ray of the lumbar spine also pending.  ----------------------------------------- 6:53 PM on 11/06/2023 ----------------------------------------- Patient complaining of nausea. Ondansetron ordered.  ----------------------------------------- 8:00 PM on 11/06/2023 -----------------------------------------  Wrist reduction attempted after hematoma block.  See procedure note for details.  Patient tolerated the  procedure well.  Postreduction image shows an improved radial tilt and impaction with resolution of the previously noted ulnar styloid displacement.  Patient pain was well-controlled.  CT and x-ray results discussed with patient and family.   Patient will be discharged home with instruction to call and schedule an appointment with orthopedics.  She was advised to return to the emergency department if the pain is not well-controlled with tramadol and Tylenol or if she has decreased sensation in her hand or fingers.     FINAL CLINICAL IMPRESSION(S) / ED DIAGNOSES   Final diagnoses:  Closed Colles' fracture of left radius, initial encounter  Closed displaced fracture of styloid process of left ulna, initial encounter  Minor head injury, initial encounter  Acute bilateral low back pain without sciatica     Rx / DC Orders   ED Discharge Orders  Ordered    traMADol (ULTRAM) 50 MG tablet  Every 8 hours PRN        11/06/23 2059             Note:  This document was prepared using Dragon voice recognition software and may include unintentional dictation errors.   Chinita Pester, FNP 11/06/23 2354    Janith Lima, MD 11/07/23 [phone]

## 2023-11-06 NOTE — ED Notes (Signed)
 Left long arm orthoglass splint placed/

## 2023-11-07 ENCOUNTER — Emergency Department

## 2023-11-07 ENCOUNTER — Other Ambulatory Visit: Payer: Self-pay

## 2023-11-07 ENCOUNTER — Inpatient Hospital Stay
Admission: EM | Admit: 2023-11-07 | Discharge: 2023-11-16 | DRG: 981 | Disposition: A | Discharge status: Skilled Nursing Facility | Attending: Internal Medicine | Admitting: Internal Medicine

## 2023-11-07 DIAGNOSIS — I6501 Occlusion and stenosis of right vertebral artery: Secondary | ICD-10-CM | POA: Diagnosis present

## 2023-11-07 DIAGNOSIS — Z66 Do not resuscitate: Secondary | ICD-10-CM | POA: Diagnosis not present

## 2023-11-07 DIAGNOSIS — Z841 Family history of disorders of kidney and ureter: Secondary | ICD-10-CM

## 2023-11-07 DIAGNOSIS — K909 Intestinal malabsorption, unspecified: Secondary | ICD-10-CM | POA: Diagnosis present

## 2023-11-07 DIAGNOSIS — G928 Other toxic encephalopathy: Principal | ICD-10-CM | POA: Diagnosis present

## 2023-11-07 DIAGNOSIS — Y92009 Unspecified place in unspecified non-institutional (private) residence as the place of occurrence of the external cause: Secondary | ICD-10-CM

## 2023-11-07 DIAGNOSIS — R29818 Other symptoms and signs involving the nervous system: Secondary | ICD-10-CM | POA: Diagnosis not present

## 2023-11-07 DIAGNOSIS — D519 Vitamin B12 deficiency anemia, unspecified: Secondary | ICD-10-CM | POA: Diagnosis present

## 2023-11-07 DIAGNOSIS — I2510 Atherosclerotic heart disease of native coronary artery without angina pectoris: Secondary | ICD-10-CM | POA: Diagnosis present

## 2023-11-07 DIAGNOSIS — Z882 Allergy status to sulfonamides status: Secondary | ICD-10-CM

## 2023-11-07 DIAGNOSIS — D509 Iron deficiency anemia, unspecified: Secondary | ICD-10-CM | POA: Diagnosis present

## 2023-11-07 DIAGNOSIS — Z8616 Personal history of COVID-19: Secondary | ICD-10-CM

## 2023-11-07 DIAGNOSIS — M549 Dorsalgia, unspecified: Secondary | ICD-10-CM | POA: Diagnosis present

## 2023-11-07 DIAGNOSIS — G9341 Metabolic encephalopathy: Secondary | ICD-10-CM | POA: Diagnosis not present

## 2023-11-07 DIAGNOSIS — Z888 Allergy status to other drugs, medicaments and biological substances status: Secondary | ICD-10-CM

## 2023-11-07 DIAGNOSIS — Z8249 Family history of ischemic heart disease and other diseases of the circulatory system: Secondary | ICD-10-CM

## 2023-11-07 DIAGNOSIS — R296 Repeated falls: Secondary | ICD-10-CM | POA: Diagnosis present

## 2023-11-07 DIAGNOSIS — I6203 Nontraumatic chronic subdural hemorrhage: Secondary | ICD-10-CM | POA: Diagnosis present

## 2023-11-07 DIAGNOSIS — Z7984 Long term (current) use of oral hypoglycemic drugs: Secondary | ICD-10-CM

## 2023-11-07 DIAGNOSIS — Z86718 Personal history of other venous thrombosis and embolism: Secondary | ICD-10-CM

## 2023-11-07 DIAGNOSIS — R112 Nausea with vomiting, unspecified: Secondary | ICD-10-CM | POA: Diagnosis not present

## 2023-11-07 DIAGNOSIS — R29810 Facial weakness: Secondary | ICD-10-CM | POA: Diagnosis present

## 2023-11-07 DIAGNOSIS — E785 Hyperlipidemia, unspecified: Secondary | ICD-10-CM | POA: Diagnosis present

## 2023-11-07 DIAGNOSIS — S62102A Fracture of unspecified carpal bone, left wrist, initial encounter for closed fracture: Secondary | ICD-10-CM | POA: Diagnosis present

## 2023-11-07 DIAGNOSIS — Z604 Social exclusion and rejection: Secondary | ICD-10-CM | POA: Diagnosis present

## 2023-11-07 DIAGNOSIS — R197 Diarrhea, unspecified: Secondary | ICD-10-CM | POA: Diagnosis present

## 2023-11-07 DIAGNOSIS — R29701 NIHSS score 1: Secondary | ICD-10-CM | POA: Diagnosis present

## 2023-11-07 DIAGNOSIS — I10 Essential (primary) hypertension: Secondary | ICD-10-CM | POA: Diagnosis present

## 2023-11-07 DIAGNOSIS — E1169 Type 2 diabetes mellitus with other specified complication: Secondary | ICD-10-CM | POA: Diagnosis present

## 2023-11-07 DIAGNOSIS — K529 Noninfective gastroenteritis and colitis, unspecified: Secondary | ICD-10-CM | POA: Diagnosis present

## 2023-11-07 DIAGNOSIS — F32A Depression, unspecified: Secondary | ICD-10-CM | POA: Diagnosis present

## 2023-11-07 DIAGNOSIS — I672 Cerebral atherosclerosis: Secondary | ICD-10-CM | POA: Diagnosis not present

## 2023-11-07 DIAGNOSIS — T40425A Adverse effect of tramadol, initial encounter: Secondary | ICD-10-CM | POA: Diagnosis present

## 2023-11-07 DIAGNOSIS — I6523 Occlusion and stenosis of bilateral carotid arteries: Secondary | ICD-10-CM | POA: Diagnosis not present

## 2023-11-07 DIAGNOSIS — R471 Dysarthria and anarthria: Secondary | ICD-10-CM | POA: Diagnosis present

## 2023-11-07 DIAGNOSIS — Z90710 Acquired absence of both cervix and uterus: Secondary | ICD-10-CM

## 2023-11-07 DIAGNOSIS — Z880 Allergy status to penicillin: Secondary | ICD-10-CM

## 2023-11-07 DIAGNOSIS — S52572A Other intraarticular fracture of lower end of left radius, initial encounter for closed fracture: Secondary | ICD-10-CM | POA: Diagnosis present

## 2023-11-07 DIAGNOSIS — F419 Anxiety disorder, unspecified: Secondary | ICD-10-CM | POA: Diagnosis present

## 2023-11-07 DIAGNOSIS — R4781 Slurred speech: Secondary | ICD-10-CM | POA: Diagnosis present

## 2023-11-07 DIAGNOSIS — Z823 Family history of stroke: Secondary | ICD-10-CM

## 2023-11-07 DIAGNOSIS — E876 Hypokalemia: Secondary | ICD-10-CM | POA: Diagnosis present

## 2023-11-07 DIAGNOSIS — Z79899 Other long term (current) drug therapy: Secondary | ICD-10-CM

## 2023-11-07 DIAGNOSIS — R531 Weakness: Secondary | ICD-10-CM | POA: Diagnosis not present

## 2023-11-07 DIAGNOSIS — M810 Age-related osteoporosis without current pathological fracture: Secondary | ICD-10-CM | POA: Diagnosis present

## 2023-11-07 DIAGNOSIS — E1165 Type 2 diabetes mellitus with hyperglycemia: Secondary | ICD-10-CM | POA: Diagnosis present

## 2023-11-07 DIAGNOSIS — S065XAA Traumatic subdural hemorrhage with loss of consciousness status unknown, initial encounter: Secondary | ICD-10-CM | POA: Diagnosis not present

## 2023-11-07 DIAGNOSIS — I700 Atherosclerosis of aorta: Secondary | ICD-10-CM | POA: Diagnosis present

## 2023-11-07 DIAGNOSIS — Z8673 Personal history of transient ischemic attack (TIA), and cerebral infarction without residual deficits: Secondary | ICD-10-CM

## 2023-11-07 DIAGNOSIS — R4182 Altered mental status, unspecified: Principal | ICD-10-CM

## 2023-11-07 DIAGNOSIS — G459 Transient cerebral ischemic attack, unspecified: Secondary | ICD-10-CM | POA: Insufficient documentation

## 2023-11-07 DIAGNOSIS — K5730 Diverticulosis of large intestine without perforation or abscess without bleeding: Secondary | ICD-10-CM | POA: Diagnosis present

## 2023-11-07 DIAGNOSIS — S52612A Displaced fracture of left ulna styloid process, initial encounter for closed fracture: Secondary | ICD-10-CM | POA: Diagnosis present

## 2023-11-07 DIAGNOSIS — E871 Hypo-osmolality and hyponatremia: Secondary | ICD-10-CM | POA: Diagnosis present

## 2023-11-07 DIAGNOSIS — K8689 Other specified diseases of pancreas: Secondary | ICD-10-CM | POA: Diagnosis present

## 2023-11-07 LAB — URINE DRUG SCREEN, QUALITATIVE (ARMC ONLY)
Amphetamines, Ur Screen: NOT DETECTED
Barbiturates, Ur Screen: NOT DETECTED
Benzodiazepine, Ur Scrn: NOT DETECTED
Cannabinoid 50 Ng, Ur ~~LOC~~: NOT DETECTED
Cocaine Metabolite,Ur ~~LOC~~: NOT DETECTED
MDMA (Ecstasy)Ur Screen: NOT DETECTED
Methadone Scn, Ur: NOT DETECTED
Opiate, Ur Screen: NOT DETECTED
Phencyclidine (PCP) Ur S: NOT DETECTED
Tricyclic, Ur Screen: NOT DETECTED

## 2023-11-07 LAB — COMPREHENSIVE METABOLIC PANEL WITH GFR
ALT: 13 U/L (ref 0–44)
AST: 16 U/L (ref 15–41)
Albumin: 3 g/dL — ABNORMAL LOW (ref 3.5–5.0)
Alkaline Phosphatase: 74 U/L (ref 38–126)
Anion gap: 8 (ref 5–15)
BUN: 12 mg/dL (ref 8–23)
CO2: 22 mmol/L (ref 22–32)
Calcium: 8.3 mg/dL — ABNORMAL LOW (ref 8.9–10.3)
Chloride: 100 mmol/L (ref 98–111)
Creatinine, Ser: 0.83 mg/dL (ref 0.44–1.00)
GFR, Estimated: >60 mL/min (ref >60)
Glucose, Bld: 277 mg/dL — ABNORMAL HIGH (ref 70–99)
Potassium: 4.3 mmol/L (ref 3.5–5.1)
Sodium: 130 mmol/L — ABNORMAL LOW (ref 135–145)
Total Bilirubin: 0.7 mg/dL (ref 0.0–1.2)
Total Protein: 6.8 g/dL (ref 6.5–8.1)

## 2023-11-07 LAB — URINALYSIS, ROUTINE W REFLEX MICROSCOPIC
Bacteria, UA: NONE SEEN
Bilirubin Urine: NEGATIVE
Glucose, UA: >=500 mg/dL — AB
Hgb urine dipstick: NEGATIVE
Ketones, ur: 5 mg/dL — AB
Leukocytes,Ua: NEGATIVE
Nitrite: NEGATIVE
Protein, ur: NEGATIVE mg/dL
Specific Gravity, Urine: 1.02 (ref 1.005–1.030)
pH: 7 (ref 5.0–8.0)

## 2023-11-07 LAB — CBC
HCT: 26.5 % — ABNORMAL LOW (ref 36.0–46.0)
Hemoglobin: 8.4 g/dL — ABNORMAL LOW (ref 12.0–15.0)
MCH: 24.7 pg — ABNORMAL LOW (ref 26.0–34.0)
MCHC: 31.7 g/dL (ref 30.0–36.0)
MCV: 77.9 fL — ABNORMAL LOW (ref 80.0–100.0)
Platelets: 325 10*3/uL (ref 150–400)
RBC: 3.4 MIL/uL — ABNORMAL LOW (ref 3.87–5.11)
RDW: 15.3 % (ref 11.5–15.5)
WBC: 10.4 10*3/uL (ref 4.0–10.5)
nRBC: 0 % (ref 0.0–0.2)

## 2023-11-07 LAB — PROTIME-INR
INR: 1.1 (ref 0.8–1.2)
Prothrombin Time: 14 s (ref 11.4–15.2)

## 2023-11-07 LAB — ETHANOL: Alcohol, Ethyl (B): <10 mg/dL (ref <10)

## 2023-11-07 LAB — DIFFERENTIAL
Abs Immature Granulocytes: 0.1 10*3/uL — ABNORMAL HIGH (ref 0.00–0.07)
Basophils Absolute: 0.1 10*3/uL (ref 0.0–0.1)
Basophils Relative: 1 %
Eosinophils Absolute: 0.3 10*3/uL (ref 0.0–0.5)
Eosinophils Relative: 3 %
Immature Granulocytes: 1 %
Lymphocytes Relative: 16 %
Lymphs Abs: 1.6 10*3/uL (ref 0.7–4.0)
Monocytes Absolute: 0.8 10*3/uL (ref 0.1–1.0)
Monocytes Relative: 8 %
Neutro Abs: 7.5 10*3/uL (ref 1.7–7.7)
Neutrophils Relative %: 71 %

## 2023-11-07 LAB — APTT: aPTT: 26 s (ref 24–36)

## 2023-11-07 MED ORDER — HALOPERIDOL LACTATE 5 MG/ML IJ SOLN
2.0000 mg | Freq: Once | INTRAMUSCULAR | Status: AC
  Administered 2023-11-07: 2 mg via INTRAVENOUS
  Filled 2023-11-07: qty 1

## 2023-11-07 MED ORDER — ACETAMINOPHEN 325 MG PO TABS
650.0000 mg | ORAL_TABLET | ORAL | Status: DC | PRN
  Administered 2023-11-08 – 2023-11-10 (×3): 650 mg via ORAL
  Filled 2023-11-07 (×2): qty 2

## 2023-11-07 MED ORDER — STROKE: EARLY STAGES OF RECOVERY BOOK: Freq: Once | Status: DC

## 2023-11-07 MED ORDER — ACETAMINOPHEN 160 MG/5ML PO SOLN: 650.0000 mg | ORAL | Status: DC | PRN

## 2023-11-07 MED ORDER — INSULIN ASPART 100 UNIT/ML IJ SOLN
0.0000 [IU] | Freq: Three times a day (TID) | INTRAMUSCULAR | Status: DC
  Administered 2023-11-08: 5 [IU] via SUBCUTANEOUS
  Administered 2023-11-08 (×2): 3 [IU] via SUBCUTANEOUS
  Administered 2023-11-09 – 2023-11-10 (×2): 2 [IU] via SUBCUTANEOUS
  Administered 2023-11-10: 1 [IU] via SUBCUTANEOUS
  Administered 2023-11-11 (×2): 2 [IU] via SUBCUTANEOUS
  Administered 2023-11-11: 3 [IU] via SUBCUTANEOUS
  Administered 2023-11-12 – 2023-11-13 (×5): 2 [IU] via SUBCUTANEOUS
  Administered 2023-11-13: 3 [IU] via SUBCUTANEOUS
  Administered 2023-11-14 – 2023-11-15 (×4): 2 [IU] via SUBCUTANEOUS
  Administered 2023-11-15: 5 [IU] via SUBCUTANEOUS
  Administered 2023-11-15 – 2023-11-16 (×4): 3 [IU] via SUBCUTANEOUS
  Filled 2023-11-07 (×24): qty 1

## 2023-11-07 MED ORDER — ONDANSETRON HCL 4 MG/2ML IJ SOLN
4.0000 mg | Freq: Once | INTRAMUSCULAR | Status: AC
  Administered 2023-11-07: 4 mg via INTRAVENOUS
  Filled 2023-11-07: qty 2

## 2023-11-07 MED ORDER — INSULIN ASPART 100 UNIT/ML IJ SOLN
0.0000 [IU] | Freq: Every day | INTRAMUSCULAR | Status: DC
  Administered 2023-11-08: 5 [IU] via SUBCUTANEOUS
  Administered 2023-11-10: 2 [IU] via SUBCUTANEOUS
  Administered 2023-11-11 – 2023-11-12 (×2): 3 [IU] via SUBCUTANEOUS
  Administered 2023-11-13: 2 [IU] via SUBCUTANEOUS
  Administered 2023-11-15: 3 [IU] via SUBCUTANEOUS
  Filled 2023-11-07 (×7): qty 1

## 2023-11-07 MED ORDER — LORAZEPAM 0.5 MG PO TABS
0.5000 mg | ORAL_TABLET | Freq: Once | ORAL | Status: DC
Start: 2023-11-07 — End: 2023-11-07
  Administered 2023-11-07: 0.5 mg via ORAL
  Filled 2023-11-07: qty 1

## 2023-11-07 MED ORDER — LIDOCAINE 5 % EX PTCH
1.0000 | MEDICATED_PATCH | CUTANEOUS | Status: DC
  Administered 2023-11-08 – 2023-11-16 (×7): 1 via TRANSDERMAL
  Filled 2023-11-07 (×10): qty 1

## 2023-11-07 MED ORDER — OXYCODONE-ACETAMINOPHEN 5-325 MG PO TABS
1.0000 | ORAL_TABLET | ORAL | Status: DC | PRN
  Administered 2023-11-10 – 2023-11-12 (×4): 1 via ORAL
  Filled 2023-11-07 (×4): qty 1

## 2023-11-07 MED ORDER — LORAZEPAM 2 MG/ML IJ SOLN
0.5000 mg | Freq: Once | INTRAMUSCULAR | Status: AC
  Administered 2023-11-07: 0.5 mg via INTRAVENOUS
  Filled 2023-11-07: qty 1

## 2023-11-07 MED ORDER — IOHEXOL 350 MG/ML SOLN
100.0000 mL | Freq: Once | INTRAVENOUS | Status: AC | PRN
  Administered 2023-11-07: 100 mL via INTRAVENOUS

## 2023-11-07 MED ORDER — SODIUM CHLORIDE 0.9 % IV SOLN: INTRAVENOUS | Status: AC

## 2023-11-07 MED ORDER — METHOCARBAMOL 500 MG PO TABS
500.0000 mg | ORAL_TABLET | Freq: Three times a day (TID) | ORAL | Status: DC | PRN
  Administered 2023-11-10: 500 mg via ORAL
  Filled 2023-11-07: qty 1

## 2023-11-07 MED ORDER — ENOXAPARIN SODIUM 40 MG/0.4ML IJ SOSY
40.0000 mg | PREFILLED_SYRINGE | INTRAMUSCULAR | Status: DC
  Administered 2023-11-08 – 2023-11-09 (×2): 40 mg via SUBCUTANEOUS
  Filled 2023-11-07 (×2): qty 0.4

## 2023-11-07 MED ORDER — MORPHINE SULFATE (PF) 2 MG/ML IV SOLN
2.0000 mg | INTRAVENOUS | Status: DC | PRN
  Administered 2023-11-08 – 2023-11-11 (×5): 2 mg via INTRAVENOUS
  Filled 2023-11-07 (×5): qty 1

## 2023-11-07 MED ORDER — ACETAMINOPHEN 650 MG RE SUPP: 650.0000 mg | RECTAL | Status: DC | PRN

## 2023-11-07 NOTE — Consult Note (Addendum)
 [facility] Consult Services   Patient Name:   Arion, Morgan Date of Birth:   Sep 01, 1935 Identification Number:   MRN - [id number] Date of Service:   11/07/2023 20:30:36  Addendum:  CT did show chronic bilateral fluid collection but they re unchanged from prior studies and likely incidental.   Diagnosis:       R29.810 - Facial numbness/ Facial weakness       R47.81 - Slurred speech  Impression:      PT is an 88 yo F with hx of DM, HTN, HLP, DVT who presents to the ED with a right facial droop, slurred speech and AMS with a LKW at 17:15. PT was initially slow to respond, but there was no obvious aphasia or focal weakness. NIHSS was 1 for dysarthria. TNK was therefore deferred due to her mild deficits. CTA head and neck showed no obvious LVO but will follow-up with the final report.    Differential includes stroke vs a toxic-metabolic encephalopathy    Recommend admission for stroke work-up including an MRI brain without contrast, lipid panel, hemoglobin AIC.    Can start ASA 81 mg daily once she passes a dysphagia screen while waiting for work-up to be completed.  Our recommendations are outlined below.  Recommendations:        Stroke/Telemetry Floor       Neuro Checks       Bedside Swallow Eval       DVT Prophylaxis       IV Fluids, Normal Saline       Head of Bed 30 Degrees       Euglycemia and Avoid Hyperthermia (PRN Acetaminophen)       Initiate or continue Aspirin 81 MG daily       MRI brain       Lipid panel       Hemoglobin AIC  Sign Out:       Discussed with Emergency Department Provider    ------------------------------------------------------------------------------  Advanced Imaging: CTA Head and Neck Completed.  LVO:No  Patient in not a candidate for NIR   Metrics: Last Known Well: 11/07/2023 17:15:00 Dispatch Time: 11/07/2023 20:30:36 Arrival Time: 11/07/2023 19:53:00 Initial Response Time: 11/07/2023  20:33:41 Symptoms: Right sided weakness, AMS . Initial patient interaction: 11/07/2023 20:36:40 NIHSS Assessment Completed: 11/07/2023 21:11:18 Patient is not a candidate for Thrombolytic. Thrombolytic Medical Decision: 11/07/2023 21:11:21 Patient was not deemed candidate for Thrombolytic because of following reasons: Stroke severity too mild (non-disabling) .  I personally Reviewed the CT Head and it Showed no obvious ICH or early ischemic changes. There were chronic bilateral extra-axial fluid collections.  Primary Provider Notified of Diagnostic Impression and Management Plan on: 11/07/2023 21:21:07    ------------------------------------------------------------------------------  History of Present Illness: Patient is a 88 year old Female.  Patient was brought by private transportation with symptoms of Right sided weakness, AMS . PT is an 88 yo F with hx of DM, HTN, HLP, DVT who presents to the ED with a right facial droop, slurred speech and AMS. PT fell yesterday hitting her head and sustaining a right radial fracture. Today pt was in her USOH when her daughter-in-law noted a right facial droop and slurred speech. Pt then became to respond and less coherent in conversation. There was no obvious arm or leg weakness. She has been on tramadol for pain.   Past Medical History: Other PMH:  DVT  Medications:  No Anticoagulant use  No Antiplatelet use Reviewed EMR for current medications  Allergies:  Reviewed Description: PCN  Social History: Smoking: No  Family History:  There Is Family History [id number]. There is no family history of premature cerebrovascular disease pertinent to this consultation  ROS : 14 Points Review of Systems was performed and was negative except mentioned in HPI.  Past Surgical History: There Is No Surgical History Contributory To Today's Visit    Examination: BP(172/67), Pulse(90), Blood Glucose(343) 1A: Level of Consciousness -  Alert; keenly responsive + 0 1B: Ask Month and Age - Both Questions Right + 0 1C: Blink Eyes & Squeeze Hands - Performs Both Tasks + 0 2: Test Horizontal Extraocular Movements - Normal + 0 3: Test Visual Fields - No Visual Loss + 0 4: Test Facial Palsy (Use Grimace if Obtunded) - Normal symmetry + 0 5A: Test Left Arm Motor Drift - No Drift for 10 Seconds + 0 5B: Test Right Arm Motor Drift - No Drift for 10 Seconds + 0 6A: Test Left Leg Motor Drift - No Drift for 5 Seconds + 0 6B: Test Right Leg Motor Drift - No Drift for 5 Seconds + 0 7: Test Limb Ataxia (FNF/Heel-Shin) - No Ataxia + 0 8: Test Sensation - Normal; No sensory loss + 0 9: Test Language/Aphasia - Normal; No aphasia + 0 10: Test Dysarthria - Mild-Moderate Dysarthria: Slurring but can be understood + 1 11: Test Extinction/Inattention - No abnormality + 0  NIHSS Score: 1  NIHSS Free Text : PT was agitated due to pain when being moved from the CT table and so was not cooperative with stroke cards, but was able to name a few objects and speak in complete sentences asking to go to the bathroom and so there was no obvious aphasia. She was oriented x 3.  Pre-Morbid Modified Rankin Scale: 3 Points = Moderate disability; requiring some help, but able to walk without assistance  Spoke with : Dr. Claudell Kyle  This consult was conducted in real time using interactive audio and video technology. Patient was informed of the technology being used for this visit and agreed to proceed. Patient located in hospital and provider located at home/office setting.   Patient is being evaluated for possible acute neurologic impairment and high probability of imminent or life-threatening deterioration. I spent total of 52 minutes providing care to this patient, including time for face to face visit via telemedicine, review of medical records, imaging studies and discussion of findings with providers, the patient and/or family.   Dr Jacky Kindle   [facility] For Inpatient follow-up with [facility] physician please call [facility] at [phone]. As we are not an outpatient service for any post hospital discharge needs please contact the hospital for assistance. If you have any questions for the [facility] physicians or need to reconsult for clinical or diagnostic changes please contact us via [facility] at [phone].

## 2023-11-07 NOTE — ED Notes (Signed)
 Sitter requested for pt known delusional behavior unsafe for self and staff pt continues to attempt to exit stretcher LUE in cast

## 2023-11-07 NOTE — Progress Notes (Addendum)
 2013 - Code Stroke Activated   LKWT 1715. mRS 3. Presents to the ED by EMS with slurred speech, AMS, RLE paresthesia and weakness. Patient had a fall yesterday that resulted in a L radius fracture.   2030 - Tele-Neurologist paged   2033 - Dr. Jacky Kindle joined stroke cart  2042 - Patient to CT   2055 - NCCT results given to Dr. Manson Passey on camera   2106 - Patient returned to room from CT   2127 - CTA & CTP results sent to Dr. Manson Passey

## 2023-11-07 NOTE — ED Triage Notes (Signed)
 Pt arrives from home via ems c/c generalized weakness with facial droop 2 hours prior to ems arrival per family upon ems arrival pt strength appears WNL pt presents with AMS facial droop resolved @ this time report pt on new pain relief medication ems BG POC 343 recent visit to ED for fall CT completed on that previous visit

## 2023-11-07 NOTE — ED Provider Notes (Addendum)
 [facility] Provider Note    Event Date/Time   First MD Initiated Contact with Patient 11/07/23 1955     (approximate)   History   Altered Mental Status   HPI Morgan Bowen is a 88 y.o. female with history of HTN, DM2, HLD presenting today for altered mental status.  Family called EMS for reported onset of facial droop 2 hours prior to arrival.  They also noted acute change in her mental status.  She is normally alert and oriented x 4 and able to have a conversation that significantly changed at that time.  No obvious focal deficits they could see with weakness or numbness anywhere.  She was recently seen in the ED yesterday and had imaging for a fall which was most notable for fracture to her left distal radius/ulna in which she was placed in a splint.  She was started on tramadol and they state that she did take this medication earlier today and unsure if this contributed to symptoms.  Patient otherwise noncontributory to her own history and disoriented on arrival.  Reviewed ED notes from yesterday     Physical Exam   Triage Vital Signs: ED Triage Vitals  Encounter Vitals Group     BP 11/07/23 2127 (!) 172/67     Systolic BP Percentile --      Diastolic BP Percentile --      Pulse Rate 11/07/23 1959 90     Resp 11/07/23 1959 15     Temp 11/07/23 1959 97.8 F (36.6 C)     Temp Source 11/07/23 1959 Oral     SpO2 11/07/23 1959 94 %     Weight 11/07/23 2002 120 lb (54.4 kg)     Height --      Head Circumference --      Peak Flow --      Pain Score --      Pain Loc --      Pain Education --      Exclude from Growth Chart --     Most recent vital signs: Vitals:   11/07/23 1959 11/07/23 2127  BP:  (!) 172/67  Pulse: 90   Resp: 15 16  Temp: 97.8 F (36.6 C)   SpO2: 94%    Physical Exam: I have reviewed the vital signs and nursing notes. General: Awake, alert, noticeably disoriented and confused Head:  Atraumatic, normocephalic.   ENT:  EOM  intact, PERRL. Oral mucosa is pink and moist with no lesions. Neck: Neck is supple with full range of motion, No meningeal signs. Cardiovascular:  RRR, No murmurs. Peripheral pulses palpable and equal bilaterally. Respiratory:  Symmetrical chest wall expansion.  No rhonchi, rales, or wheezes.  Good air movement throughout.  No use of accessory muscles.   Musculoskeletal:  No cyanosis or edema. Moving extremities with full ROM Abdomen:  Soft, nontender, nondistended. Neuro:  GCS 14, oriented only to name, moving all four extremities, pleasantly confused.  Speech appears clear without obvious dysarthria but difficult to tell from her baseline as she is not talking significantly.  Sensation seems intact and complete throughout her left side extremities.  She either has confusion or possible right-sided neglect as she cannot always state right side when I am pressing on her leg or her arm.  5 out of 5 strength otherwise appears intact throughout bilateral upper and lower extremities with no drift.  Cranial nerves II through XII appear mostly intact but once again unable to tell speech  differences from her baseline Psych:  Calm, appropriate.   Skin:  Warm, dry, no rash.    ED Results / Procedures / Treatments   Labs (all labs ordered are listed, but only abnormal results are displayed) Labs Reviewed  CBC - Abnormal; Notable for the following components:      Result Value   RBC 3.40 (*)    Hemoglobin 8.4 (*)    HCT 26.5 (*)    MCV 77.9 (*)    MCH 24.7 (*)    All other components within normal limits  DIFFERENTIAL - Abnormal; Notable for the following components:   Abs Immature Granulocytes 0.10 (*)    All other components within normal limits  COMPREHENSIVE METABOLIC PANEL WITH GFR - Abnormal; Notable for the following components:   Sodium 130 (*)    Glucose, Bld 277 (*)    Calcium 8.3 (*)    Albumin 3.0 (*)    All other components within normal limits  URINALYSIS, ROUTINE W REFLEX  MICROSCOPIC - Abnormal; Notable for the following components:   Color, Urine STRAW (*)    APPearance CLEAR (*)    Glucose, UA >=500 (*)    Ketones, ur 5 (*)    All other components within normal limits  PROTIME-INR  APTT  URINE DRUG SCREEN, QUALITATIVE ([facility] ONLY)  ETHANOL     EKG My EKG interpretation: Rate of 87, normal sinus rhythm, normal axis, normal intervals.  No acute ST elevations or depressions   RADIOLOGY Independently interpreted CT head and CTA head and neck with no acute focal stroke concerns.  She has chronic bilateral subdurals which have not changed   PROCEDURES:  Critical Care performed: Yes, see critical care procedure note(s)  .Critical Care  Performed by: Janith Lima, MD Authorized by: Janith Lima, MD   Critical care provider statement:    Critical care time (minutes):  30   Critical care was necessary to treat or prevent imminent or life-threatening deterioration of the following conditions: stroke like symptoms.   Critical care was time spent personally by me on the following activities:  Development of treatment plan with patient or surrogate, discussions with consultants, evaluation of patient's response to treatment, examination of patient, ordering and review of laboratory studies, ordering and review of radiographic studies, ordering and performing treatments and interventions, pulse oximetry, re-evaluation of patient's condition and review of old charts   I assumed direction of critical care for this patient from another provider in my specialty: no     Care discussed with: admitting provider      MEDICATIONS ORDERED IN ED: Medications  iohexol (OMNIPAQUE) 350 MG/ML injection 100 mL (100 mLs Intravenous Contrast Given 11/07/23 2109)     IMPRESSION / MDM / ASSESSMENT AND PLAN / ED COURSE  I reviewed the triage vital signs and the nursing notes.                              Differential diagnosis includes, but is not limited to, CVA,  medication side effect, acute infection, delirium, UTI  Patient's presentation is most consistent with acute presentation with potential threat to life or bodily function.  Patient is an 88 year old female presenting today for concerns of stroke and altered mental status.  Onset of symptoms within 2 hours of arrival.  Difficult to tell if she has possible right-sided neglect giving her confusion and unable to fully appreciate if she has true dysarthria or not.  Otherwise no other obvious focal neurological findings.  NIHSS of 1 at most.  Stroke alert was called given within the timeframe.  CT imaging of head and CTA head and neck show no acute CVA pathology.  She has chronic bilateral subdurals which are unchanged today.  Laboratory workup otherwise mostly reassuring.  No evidence of UTI.  CMP normal.  Patient still noticeably disoriented from her baseline.  This potentially could be a result of her tramadol or other delirium induced effect.  Neurology recommends admission for full stroke workup with MRI brain and ongoing monitoring.  No indication for TNK and not meeting criteria for thrombectomy.  Patient admitted to hospitalist for further care.  The patient is on the cardiac monitor to evaluate for evidence of arrhythmia and/or significant heart rate changes. Clinical Course as of 11/07/23 2145  Mon Nov 07, 2023  2119 Neurology - no obvious pathology. No TNK. No thrombectomy. Admit for MRI brain and further evaluation.  [DW]    Clinical Course User Index [DW] Janith Lima, MD     FINAL CLINICAL IMPRESSION(S) / ED DIAGNOSES   Final diagnoses:  Altered mental status, unspecified altered mental status type     Rx / DC Orders   ED Discharge Orders     None        Note:  This document was prepared using Dragon voice recognition software and may include unintentional dictation errors.   Janith Lima, MD 11/07/23 2145    Janith Lima, MD 11/07/23 [phone]

## 2023-11-07 NOTE — ED Notes (Signed)
 Called to [facility] to activate code stroke per MD Wells @815pm /Rep Tammy.

## 2023-11-07 NOTE — ED Notes (Signed)
 CODE STROKE CALLED IN @ THIS TIME BY SECRETARY

## 2023-11-07 NOTE — ED Notes (Signed)
 Neuro cam in room Heather RN begins eval

## 2023-11-07 NOTE — H&P (Signed)
 History and Physical    Morgan Bowen FAO:[id number] DOB: 09/27/1935 DOA: 11/07/2023  Referring MD/NP/PA:   PCP: Morgan Nest, NP   Patient coming from:  The patient is coming from home.     Chief Complaint: AMS, slurred speech, right facial droop  HPI: Morgan Bowen is a 88 y.o. female with medical history significant of HTN, HLD, DM, depression with anxiety, anemia, DVT not on anticoagulants, recent left wrist fracture, who presents with AMS, slurred speech, right facial droop.  Per her son and the daughter-in-law at the bedside, pt is normally orientated x 3.  She was found to be confused with LKW at 17:15. Pt also had slurred speech and a right facial droop.  No obvious aphasia.  No unilateral numbness or tingling in extremities.  Her right facial droop has resolved at arrival to ED.  When I saw patient in the ED, patient is confused, not oriented x 3.  She moves all extremities.  Per family, patient does not have chest pain, cough, SOB.  Patient has nausea and several episodes of nonbilious nonbloody vomiting.  Patient has chronic diarrhea intermittently.  Patient has a questionable abdominal tenderness on examination.  Not sure if patient has symptoms of UTI.  No rectal bleeding or dark stool noted.  Of note, patient had fall and found to have left wrist fracture in ED yesterday, and started on tramadol for pain control.   Data reviewed independently and ED Course: pt was found to have negative UA, WBC 10.4, GFR> 60, hemoglobin 8.4 (12.6 on 01/11/2022), negative UDS, INR 1.1, PTT 26.  Temperature normal, blood pressure 172/67, heart rate 110, RR 16, oxygen saturation 94% on room air. CTA of head and neck negative for LVO. Pt is placed in tele bed for obs.  Teleneurology, Dr. Manson Passey was consulted by EDP.  CT of head: 1. No acute intracranial abnormality. 2. ASPECTS is 10. 3. Unchanged appearance of bilateral low-density extra-axial collections, most consistent with chronic subdural  hematomas.  CTA of head and neck 1. No emergent large vessel occlusion. 2. Mild stenosis of the right vertebral artery origin due to atherosclerotic calcification. 3. Mild atherosclerotic calcification at the carotid bifurcations and skull base without hemodynamically significant stenosis. 4. A 3 mL area of ischemia on CT perfusion is likely artifactual given suboptimal time-enhancement curve.   CT-abdomen/pelvis: 1. No acute intra-abdominal pathology identified. No definite radiographic explanation for the patient's reported symptoms. 2. Moderate right coronary artery calcification. 3. Moderate to severe pancolonic diverticulosis without superimposed focal inflammatory change. 4. Remote L1 superior endplate fracture with approximately 50% loss of height.   Aortic Atherosclerosis (ICD10-I70.0).   EKG: I have personally reviewed.  Sinus rhythm, QTc 462, low voltage, nonspecific T wave change.   Review of Systems: Cannot be reviewed due to altered mental status.   Allergy:  Allergies  Allergen Reactions   Macrobid [Nitrofurantoin] Nausea Only    Pts son wanted noted that macrobid caused bad nausea   Sulfamethoxazole-Trimethoprim     Other reaction(s): Not available   Penicillins Rash    Past Medical History:  Diagnosis Date   Acute cystitis 05/23/2018   Acute deep vein thrombosis (DVT) of right tibial vein (HCC) 04/03/2020   Anxiety    Arthritis    Bronchitis 06/17/2015   Cellulitis 12/13/2018   Chickenpox    COVID-19 virus infection 06/24/2022   Diabetes mellitus without complication (HCC)    Dizziness 01/15/2022   Fall 12/10/2022   GERD (gastroesophageal reflux disease)  Hypertension    Laceration of skin and cutaneous sensory nerve of left upper extremity 04/13/2022   Osteoporosis    Type 2 diabetes mellitus (HCC)    Urinary tract infection     Past Surgical History:  Procedure Laterality Date   ABDOMINAL HYSTERECTOMY  1984   ABDOMINAL HYSTERECTOMY      APPENDECTOMY  1958   CATARACT EXTRACTION Right 04/25/2023   CHOLECYSTECTOMY     GALLBLADDER SURGERY     MANDIBLE FRACTURE SURGERY  07/13/2016   TONSILLECTOMY  1958   TONSILLECTOMY     WRIST FRACTURE SURGERY Right    2000    Social History:  reports that she has never smoked. She has never used smokeless tobacco. She reports that she does not drink alcohol and does not use drugs.  Family History:  Family History  Problem Relation Age of Onset   Hypertension Mother    Stroke Mother    Stroke Father    Hypertension Father    Hypertension Sister    Kidney disease Sister    Breast cancer Neg Hx      Prior to Admission medications   Medication Sig Start Date End Date Taking? Authorizing Provider  amLODipine (NORVASC) 10 MG tablet TAKE ONE TABLET BY MOUTH ONCE DAILY FOR BLOOD PRESSURE 03/18/23   Morgan Nest, NP  ascorbic acid (VITAMIN C) 500 MG tablet Take 500 mg by mouth daily.    [provider]  Calcium-Magnesium-Vitamin D [phone] MG-MG-UNIT TABS Take 1 tablet by mouth daily.    [provider]  cloNIDine (CATAPRES) 0.1 MG tablet Take 1 tablet by mouth once daily as needed for systolic blood pressure greater than 170. 10/24/20   Morgan Nest, NP  co-enzyme Q-10 30 MG capsule Take 30 mg by mouth 3 (three) times daily.    [provider]  gatifloxacin (ZYMAXID) 0.5 % SOLN Place 1 drop into the left eye 4 (four) times daily. Patient not taking: Reported on 08/16/2023 04/26/23   [provider]  glimepiride (AMARYL) 4 MG tablet Take 1 tablet (4 mg total) by mouth 2 (two) times daily. for diabetes. 10/04/23   Morgan Nest, NP  glucose blood (ONE TOUCH ULTRA TEST) test strip USE AS DIRECTED TEST 3 TIMES DAILY E11.9 01/18/19   Morgan Nest, NP  irbesartan (AVAPRO) 300 MG tablet TAKE ONE TABLET BY MOUTH ONCE DAILY FOR BLOOD PRESSURE 03/18/23   Morgan Nest, NP  ketorolac (ACULAR) 0.5 % ophthalmic solution Place 1 drop into the  right eye 4 (four) times daily. Patient not taking: Reported on 08/16/2023 04/26/23   [provider]  metFORMIN (GLUCOPHAGE) 1000 MG tablet TAKE 1 TABLET BY MOUTH TWICE DAILY WITH A MEAL FOR DIABETES 11/02/23   Morgan Nest, NP  metoprolol succinate (TOPROL-XL) 25 MG 24 hr tablet TAKE ONE TABLET BY MOUTH TWICE DAILY FOR blood pressure. 05/31/23   Antonieta Iba, MD  Omega-3 Fatty Acids (FISH OIL) 300 MG CAPS Take by mouth.    [provider]  prednisoLONE acetate (PRED FORTE) 1 % ophthalmic suspension Place 1 drop into the left eye 4 (four) times daily. 04/26/23   [provider]  traMADol (ULTRAM) 50 MG tablet Take 1 tablet (50 mg total) by mouth every 8 (eight) hours as needed. 11/06/23   Triplett, Kasandra Knudsen, FNP  Turmeric (QC TUMERIC COMPLEX) 500 MG CAPS Take by mouth. Patient not taking: Reported on 08/16/2023    [provider]  Vitamin D, Ergocalciferol, (  DRISDOL) 1.25 MG (50000 UNIT) CAPS capsule TAKE 1 CAPSULE BY MOUTH ONCE WEEKLY 11/02/23   Morgan Nest, NP    Physical Exam: Vitals:   11/07/23 2200 11/07/23 2330 11/07/23 2345 11/08/23 0030  BP:    (!) 168/76  Pulse: 76 89 88 87  Resp: 20 20 (!) 21 17  Temp:    98.2 F (36.8 C)  TempSrc:    Oral  SpO2: 96% 93% 92% 93%  Weight:       General: Not in acute distress HEENT:       Eyes: PERRL, EOMI, no jaundice       ENT: No discharge from the ears and nose       Neck: No JVD, no bruit, no mass felt. Heme: No neck lymph node enlargement. Cardiac: S1/S2, RRR, No murmurs, No gallops or rubs. Respiratory: No rales, wheezing, rhonchi or rubs. GI: Soft, nondistended, questionable tenderness on palpation, no organomegaly, BS present. GU: No hematuria Ext: No pitting leg edema bilaterally. 1+DP/PT pulse bilaterally. Musculoskeletal: No joint deformities, No joint redness or warmth, no limitation of ROM in spin. Skin: No rashes.  Neuro: Confused, not following command, not oriented X3, cranial  nerves II-XII grossly intact, moves all extremities. Psych: Patient is not psychotic, no suicidal or hemocidal ideation.  Labs on Admission: I have personally reviewed following labs and imaging studies  CBC: Recent Labs  Lab 11/07/23 2038  WBC 10.4  NEUTROABS 7.5  HGB 8.4*  HCT 26.5*  MCV 77.9*  PLT 325   Basic Metabolic Panel: Recent Labs  Lab 11/07/23 2038  NA 130*  K 4.3  CL 100  CO2 22  GLUCOSE 277*  BUN 12  CREATININE 0.83  CALCIUM 8.3*   GFR: Estimated Creatinine Clearance: 42.6 mL/min (by C-G formula based on SCr of 0.83 mg/dL). Liver Function Tests: Recent Labs  Lab 11/07/23 2038  AST 16  ALT 13  ALKPHOS 74  BILITOT 0.7  PROT 6.8  ALBUMIN 3.0*   No results for input(s): "LIPASE", "AMYLASE" in the last 168 hours. No results for input(s): "AMMONIA" in the last 168 hours. Coagulation Profile: Recent Labs  Lab 11/07/23 2038  INR 1.1   Cardiac Enzymes: No results for input(s): "CKTOTAL", "CKMB", "CKMBINDEX", "TROPONINI" in the last 168 hours. BNP (last 3 results) No results for input(s): "PROBNP" in the last 8760 hours. HbA1C: No results for input(s): "HGBA1C" in the last 72 hours. CBG: Recent Labs  Lab 11/06/23 1916 11/07/23 2011  GLUCAP 202* 265*   Lipid Profile: No results for input(s): "CHOL", "HDL", "LDLCALC", "TRIG", "CHOLHDL", "LDLDIRECT" in the last 72 hours. Thyroid Function Tests: No results for input(s): "TSH", "T4TOTAL", "FREET4", "T3FREE", "THYROIDAB" in the last 72 hours. Anemia Panel: Recent Labs    11/07/23 2038  FOLATE 19.8  FERRITIN 13  TIBC 451*  IRON 25*  RETICCTPCT 2.7   Urine analysis:    Component Value Date/Time   COLORURINE STRAW (A) 11/07/2023 2038   APPEARANCEUR CLEAR (A) 11/07/2023 2038   LABSPEC 1.020 11/07/2023 2038   PHURINE 7.0 11/07/2023 2038   GLUCOSEU >=500 (A) 11/07/2023 2038   HGBUR NEGATIVE 11/07/2023 2038   BILIRUBINUR NEGATIVE 11/07/2023 2038   BILIRUBINUR negative 10/26/2023 1232    BILIRUBINUR neg 05/11/2023 0917   KETONESUR 5 (A) 11/07/2023 2038   PROTEINUR NEGATIVE 11/07/2023 2038   UROBILINOGEN 0.2 10/26/2023 1232   NITRITE NEGATIVE 11/07/2023 2038   LEUKOCYTESUR NEGATIVE 11/07/2023 2038   Sepsis Labs: @LABRCNTIP (procalcitonin:4,lacticidven:4) )No results found for this or any  previous visit (from the past 240 hours).   Radiological Exams on Admission:   Assessment/Plan Principal Problem:   Acute metabolic encephalopathy Active Problems:   SDH (subdural hematoma) (HCC)   Type 2 diabetes mellitus with other specified complication (HCC)   HLD (hyperlipidemia)   Essential hypertension   Microcytic anemia   Nausea vomiting and diarrhea   Anxiety and depression   Left wrist fracture   Assessment and Plan:  Acute metabolic encephalopathy: Etiology is not clear.  Potential differential diagnosis includes tramadol side effects and TIA.  Teleneurology, Dr. Manson Passey recommended admission for stroke work-up including an MRI brain without contrast, lipid panel, hemoglobin AIC.   -Placed on tele bed for observation -Hold tramadol - Obtain MRI-brain  - will not start ASA due to presence of possible chronic SDH as shown by CT scan of head - Statin: Lipitor 40 mg daily - fasting lipid panel - pt had A1c 8.8 on 08/16/2023, will not repeat.  - swallowing screen. If fails, will get SLP - Check UDS  - PT/OT consult  Possible SDH (subdural hematoma) (HCC): as shown by CT scan of head -Frequent neurocheck  Type 2 diabetes mellitus with other specified complication [facility]): Recent A1c 8.8, poorly controlled.  Patient taking metformin and Amaryl -SSI  HLD (hyperlipidemia) -Started Lipitor 40 mg daily  Essential hypertension -hold amlodipine, irbesartan, metoprolol due to possible TIA - prn IV hydralazine for SBP>180 (patient has possible subdural hematoma, will not target SBP> 220)  Microcytic anemia: Hemoglobin 12.6 on 01/11/2022 --> 8.4.  Per family, no dark stool  or rectal bleeding noted. -Check anemia panel  Nausea vomiting and diarrhea: Etiology is not clear.  CT of abdomen/pelvis is negative for acute issues. -As needed Zofran -Check C. Difficile  Anxiety and depression: -As needed Hadol for agitation  Left wrist fracture: -As needed morphine, Percocet, Tylenol for pain -As needed Robaxin  Back pain: -Lidoderm patch     DVT ppx: SQ Lovenox  Code Status: Full code    Family Communication:  Yes, patient's son and daughter-in-law    at bed side.  Disposition Plan:  Anticipate discharge back to previous environment  Consults called:  Teleneurology, Dr. Manson Passey was consulted by EDP.   Admission status and Level of care: Telemetry Medical:    for obs as inpt        Dispo: The patient is from: Home              Anticipated d/c is to: Home              Anticipated d/c date is: 1 day              Patient currently is not medically stable to d/c.    Severity of Illness:  The appropriate patient status for this patient is OBSERVATION. Observation status is judged to be reasonable and necessary in order to provide the required intensity of service to ensure the patient's safety. The patient's presenting symptoms, physical exam findings, and initial radiographic and laboratory data in the context of their medical condition is felt to place them at decreased risk for further clinical deterioration. Furthermore, it is anticipated that the patient will be medically stable for discharge from the hospital within 2 midnights of admission.        Date of Service 11/08/2023    Lorretta Harp [facility]   If 7PM-7AM, please contact night-coverage [email or web address] 11/08/2023, 1:40 AM

## 2023-11-08 ENCOUNTER — Observation Stay

## 2023-11-08 ENCOUNTER — Other Ambulatory Visit: Payer: PRIVATE HEALTH INSURANCE

## 2023-11-08 ENCOUNTER — Encounter: Payer: Self-pay | Admitting: Internal Medicine

## 2023-11-08 DIAGNOSIS — E876 Hypokalemia: Secondary | ICD-10-CM | POA: Diagnosis present

## 2023-11-08 DIAGNOSIS — E785 Hyperlipidemia, unspecified: Secondary | ICD-10-CM | POA: Diagnosis present

## 2023-11-08 DIAGNOSIS — E871 Hypo-osmolality and hyponatremia: Secondary | ICD-10-CM | POA: Diagnosis present

## 2023-11-08 DIAGNOSIS — E1169 Type 2 diabetes mellitus with other specified complication: Secondary | ICD-10-CM | POA: Diagnosis present

## 2023-11-08 DIAGNOSIS — S52502A Unspecified fracture of the lower end of left radius, initial encounter for closed fracture: Secondary | ICD-10-CM | POA: Diagnosis not present

## 2023-11-08 DIAGNOSIS — R110 Nausea: Secondary | ICD-10-CM | POA: Diagnosis not present

## 2023-11-08 DIAGNOSIS — I6203 Nontraumatic chronic subdural hemorrhage: Secondary | ICD-10-CM | POA: Diagnosis present

## 2023-11-08 DIAGNOSIS — G8918 Other acute postprocedural pain: Secondary | ICD-10-CM | POA: Diagnosis not present

## 2023-11-08 DIAGNOSIS — Z8673 Personal history of transient ischemic attack (TIA), and cerebral infarction without residual deficits: Secondary | ICD-10-CM | POA: Diagnosis not present

## 2023-11-08 DIAGNOSIS — S52612A Displaced fracture of left ulna styloid process, initial encounter for closed fracture: Secondary | ICD-10-CM | POA: Diagnosis present

## 2023-11-08 DIAGNOSIS — Y92009 Unspecified place in unspecified non-institutional (private) residence as the place of occurrence of the external cause: Secondary | ICD-10-CM | POA: Diagnosis not present

## 2023-11-08 DIAGNOSIS — Z66 Do not resuscitate: Secondary | ICD-10-CM | POA: Diagnosis not present

## 2023-11-08 DIAGNOSIS — S52572A Other intraarticular fracture of lower end of left radius, initial encounter for closed fracture: Secondary | ICD-10-CM | POA: Diagnosis present

## 2023-11-08 DIAGNOSIS — R29810 Facial weakness: Secondary | ICD-10-CM | POA: Diagnosis not present

## 2023-11-08 DIAGNOSIS — R509 Fever, unspecified: Secondary | ICD-10-CM | POA: Diagnosis not present

## 2023-11-08 DIAGNOSIS — D513 Other dietary vitamin B12 deficiency anemia: Secondary | ICD-10-CM | POA: Diagnosis not present

## 2023-11-08 DIAGNOSIS — G459 Transient cerebral ischemic attack, unspecified: Secondary | ICD-10-CM | POA: Diagnosis present

## 2023-11-08 DIAGNOSIS — S62102A Fracture of unspecified carpal bone, left wrist, initial encounter for closed fracture: Secondary | ICD-10-CM | POA: Diagnosis present

## 2023-11-08 DIAGNOSIS — I2510 Atherosclerotic heart disease of native coronary artery without angina pectoris: Secondary | ICD-10-CM | POA: Diagnosis present

## 2023-11-08 DIAGNOSIS — S52532A Colles' fracture of left radius, initial encounter for closed fracture: Secondary | ICD-10-CM | POA: Diagnosis not present

## 2023-11-08 DIAGNOSIS — K909 Intestinal malabsorption, unspecified: Secondary | ICD-10-CM | POA: Diagnosis present

## 2023-11-08 DIAGNOSIS — G319 Degenerative disease of nervous system, unspecified: Secondary | ICD-10-CM | POA: Diagnosis not present

## 2023-11-08 DIAGNOSIS — Z79899 Other long term (current) drug therapy: Secondary | ICD-10-CM | POA: Diagnosis not present

## 2023-11-08 DIAGNOSIS — R4182 Altered mental status, unspecified: Secondary | ICD-10-CM | POA: Diagnosis not present

## 2023-11-08 DIAGNOSIS — D519 Vitamin B12 deficiency anemia, unspecified: Secondary | ICD-10-CM | POA: Diagnosis present

## 2023-11-08 DIAGNOSIS — G9341 Metabolic encephalopathy: Secondary | ICD-10-CM | POA: Diagnosis not present

## 2023-11-08 DIAGNOSIS — E1165 Type 2 diabetes mellitus with hyperglycemia: Secondary | ICD-10-CM | POA: Diagnosis present

## 2023-11-08 DIAGNOSIS — K8689 Other specified diseases of pancreas: Secondary | ICD-10-CM | POA: Diagnosis present

## 2023-11-08 DIAGNOSIS — G928 Other toxic encephalopathy: Secondary | ICD-10-CM | POA: Diagnosis present

## 2023-11-08 DIAGNOSIS — F32A Depression, unspecified: Secondary | ICD-10-CM | POA: Diagnosis present

## 2023-11-08 DIAGNOSIS — K56609 Unspecified intestinal obstruction, unspecified as to partial versus complete obstruction: Secondary | ICD-10-CM | POA: Diagnosis not present

## 2023-11-08 DIAGNOSIS — Z7401 Bed confinement status: Secondary | ICD-10-CM | POA: Diagnosis not present

## 2023-11-08 DIAGNOSIS — F419 Anxiety disorder, unspecified: Secondary | ICD-10-CM | POA: Diagnosis present

## 2023-11-08 DIAGNOSIS — Z7984 Long term (current) use of oral hypoglycemic drugs: Secondary | ICD-10-CM | POA: Diagnosis not present

## 2023-11-08 DIAGNOSIS — Z8249 Family history of ischemic heart disease and other diseases of the circulatory system: Secondary | ICD-10-CM | POA: Diagnosis not present

## 2023-11-08 DIAGNOSIS — S065XAA Traumatic subdural hemorrhage with loss of consciousness status unknown, initial encounter: Secondary | ICD-10-CM | POA: Diagnosis not present

## 2023-11-08 DIAGNOSIS — R112 Nausea with vomiting, unspecified: Secondary | ICD-10-CM | POA: Diagnosis not present

## 2023-11-08 DIAGNOSIS — Z8616 Personal history of COVID-19: Secondary | ICD-10-CM | POA: Diagnosis not present

## 2023-11-08 DIAGNOSIS — R197 Diarrhea, unspecified: Secondary | ICD-10-CM | POA: Diagnosis not present

## 2023-11-08 DIAGNOSIS — I700 Atherosclerosis of aorta: Secondary | ICD-10-CM | POA: Diagnosis present

## 2023-11-08 DIAGNOSIS — I6501 Occlusion and stenosis of right vertebral artery: Secondary | ICD-10-CM | POA: Diagnosis present

## 2023-11-08 DIAGNOSIS — I10 Essential (primary) hypertension: Secondary | ICD-10-CM | POA: Diagnosis present

## 2023-11-08 DIAGNOSIS — D509 Iron deficiency anemia, unspecified: Secondary | ICD-10-CM | POA: Diagnosis present

## 2023-11-08 DIAGNOSIS — R569 Unspecified convulsions: Secondary | ICD-10-CM | POA: Diagnosis not present

## 2023-11-08 LAB — CBC
HCT: 25.8 % — ABNORMAL LOW (ref 36.0–46.0)
Hemoglobin: 8.1 g/dL — ABNORMAL LOW (ref 12.0–15.0)
MCH: 24.2 pg — ABNORMAL LOW (ref 26.0–34.0)
MCHC: 31.4 g/dL (ref 30.0–36.0)
MCV: 77 fL — ABNORMAL LOW (ref 80.0–100.0)
Platelets: 187 10*3/uL (ref 150–400)
RBC: 3.35 MIL/uL — ABNORMAL LOW (ref 3.87–5.11)
RDW: 15.1 % (ref 11.5–15.5)
WBC: 11.6 10*3/uL — ABNORMAL HIGH (ref 4.0–10.5)
nRBC: 0.2 % (ref 0.0–0.2)

## 2023-11-08 LAB — RETICULOCYTES
Immature Retic Fract: 39.7 % — ABNORMAL HIGH (ref 2.3–15.9)
RBC.: 3.45 MIL/uL — ABNORMAL LOW (ref 3.87–5.11)
Retic Count, Absolute: 93.8 10*3/uL (ref 19.0–186.0)
Retic Ct Pct: 2.7 % (ref 0.4–3.1)

## 2023-11-08 LAB — BASIC METABOLIC PANEL WITH GFR
Anion gap: 9 (ref 5–15)
BUN: 13 mg/dL (ref 8–23)
CO2: 23 mmol/L (ref 22–32)
Calcium: 8.7 mg/dL — ABNORMAL LOW (ref 8.9–10.3)
Chloride: 98 mmol/L (ref 98–111)
Creatinine, Ser: 0.91 mg/dL (ref 0.44–1.00)
GFR, Estimated: >60 mL/min (ref >60)
Glucose, Bld: 290 mg/dL — ABNORMAL HIGH (ref 70–99)
Potassium: 4.1 mmol/L (ref 3.5–5.1)
Sodium: 130 mmol/L — ABNORMAL LOW (ref 135–145)

## 2023-11-08 LAB — FERRITIN: Ferritin: 13 ng/mL (ref 11–307)

## 2023-11-08 LAB — IRON AND TIBC
Iron: 25 ug/dL — ABNORMAL LOW (ref 28–170)
Saturation Ratios: 6 % — ABNORMAL LOW (ref 10.4–31.8)
TIBC: 451 ug/dL — ABNORMAL HIGH (ref 250–450)
UIBC: 426 ug/dL

## 2023-11-08 LAB — GLUCOSE, CAPILLARY
Glucose-Capillary: 206 mg/dL — ABNORMAL HIGH (ref 70–99)
Glucose-Capillary: 162 mg/dL — ABNORMAL HIGH (ref 70–99)

## 2023-11-08 LAB — CBG MONITORING, ED
Glucose-Capillary: 265 mg/dL — ABNORMAL HIGH (ref 70–99)
Glucose-Capillary: 394 mg/dL — ABNORMAL HIGH (ref 70–99)
Glucose-Capillary: 296 mg/dL — ABNORMAL HIGH (ref 70–99)
Glucose-Capillary: 218 mg/dL — ABNORMAL HIGH (ref 70–99)

## 2023-11-08 LAB — FOLATE: Folate: 19.8 ng/mL (ref >5.9)

## 2023-11-08 LAB — LIPID PANEL
Cholesterol: 160 mg/dL (ref 0–200)
HDL: 53 mg/dL (ref >40)
LDL Cholesterol: 94 mg/dL (ref 0–99)
Total CHOL/HDL Ratio: 3 ratio
Triglycerides: 65 mg/dL (ref <150)
VLDL: 13 mg/dL (ref 0–40)

## 2023-11-08 LAB — AMMONIA: Ammonia: 27 umol/L (ref 9–35)

## 2023-11-08 LAB — VITAMIN B12: Vitamin B-12: 85 pg/mL — ABNORMAL LOW (ref 180–914)

## 2023-11-08 MED ORDER — IRBESARTAN 150 MG PO TABS: 300.0000 mg | ORAL_TABLET | Freq: Every day | ORAL | Status: DC

## 2023-11-08 MED ORDER — ONDANSETRON HCL 4 MG/2ML IJ SOLN: 4.0000 mg | Freq: Three times a day (TID) | INTRAMUSCULAR | Status: DC | PRN

## 2023-11-08 MED ORDER — CYANOCOBALAMIN 1000 MCG/ML IJ SOLN
1000.0000 ug | Freq: Every day | INTRAMUSCULAR | Status: DC
  Administered 2023-11-09 – 2023-11-11 (×3): 1000 ug via SUBCUTANEOUS
  Filled 2023-11-08 (×4): qty 1

## 2023-11-08 MED ORDER — METOPROLOL SUCCINATE ER 25 MG PO TB24: 25.0000 mg | ORAL_TABLET | Freq: Two times a day (BID) | ORAL | Status: DC

## 2023-11-08 MED ORDER — AMLODIPINE BESYLATE 5 MG PO TABS
10.0000 mg | ORAL_TABLET | Freq: Every day | ORAL | Status: DC
Start: 2023-11-08 — End: 2023-11-08

## 2023-11-08 MED ORDER — INSULIN GLARGINE-YFGN 100 UNIT/ML ~~LOC~~ SOLN
10.0000 [IU] | Freq: Every day | SUBCUTANEOUS | Status: DC
  Administered 2023-11-08: 10 [IU] via SUBCUTANEOUS
  Filled 2023-11-08 (×2): qty 0.1

## 2023-11-08 MED ORDER — IRON SUCROSE 200 MG IVPB - SIMPLE MED
200.0000 mg | Freq: Once | Status: AC
  Administered 2023-11-08: 200 mg via INTRAVENOUS
  Filled 2023-11-08: qty 200

## 2023-11-08 MED ORDER — HYDRALAZINE HCL 20 MG/ML IJ SOLN
5.0000 mg | INTRAMUSCULAR | Status: DC | PRN
  Administered 2023-11-11: 5 mg via INTRAVENOUS
  Filled 2023-11-08: qty 1

## 2023-11-08 MED ORDER — ATORVASTATIN CALCIUM 20 MG PO TABS
40.0000 mg | ORAL_TABLET | Freq: Every day | ORAL | Status: DC
  Administered 2023-11-08 – 2023-11-16 (×7): 40 mg via ORAL
  Filled 2023-11-08 (×8): qty 2

## 2023-11-08 NOTE — Progress Notes (Signed)
 Per Dr Renford Dills, dc stroke/NIH orders

## 2023-11-08 NOTE — ED Notes (Signed)
 Unable to do NIH stroke scale due to patient unresponsiveness.

## 2023-11-08 NOTE — Evaluation (Signed)
 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      PMH significant for  HTN, HLD, DM, depression with anxiety, anemia, DVT not on anticoagulants, recent L wrist fracture     Clinical Impressions Chart reviewed, pt greeted in bed, oriented to self, flat affect and multi modal cues for one step direction following. Per DIL pt typically MOD I with ADLs, assist with IADLs, amb with rollator. Pt was still driving. Pt presents with deficits in strength, endurance, activity tolerance, balance, cognition, affecting safe and optimal ADL completion. MOD-MAX A +2 required for bed mobility, STS with MOD A +2, step pivot transfer to bsc and back to bed with MOD A +2 via HHA. MAX A required for toileting/LB dressing. Step by step-frequent multi modal cues required throughout for participation. Pt is performing ADL/functional mobiltiy below PLOF, pt will benefit from acute OT to address deficits and to facilitate optimal ADL performance. Pt is left in bed, all needs met.      If plan is discharge home, recommend the following:   A lot of help with walking and/or transfers;A lot of help with bathing/dressing/bathroom;Supervision due to cognitive status     Functional Status Assessment   Patient has had a recent decline in their functional status and demonstrates the ability to make significant improvements in function in a reasonable and predictable amount of time.     Equipment Recommendations   Other (comment) (defer to next venue of care)     Recommendations for Other Services         Precautions/Restrictions   Precautions Precautions: Fall Recall of  Precautions/Restrictions: Impaired Restrictions Weight Bearing Restrictions Per Provider Order: Yes LUE Weight Bearing Per Provider Order: Non weight bearing Other Position/Activity Restrictions: wrist fracture- with cast applied. Assuming NWB, however no official order     Mobility Bed Mobility Overal bed mobility: Needs Assistance Bed Mobility: Supine to Sit, Sit to Supine     Supine to sit: +2 for physical assistance, +2 for safety/equipment, HOB elevated, Mod assist, Max assist Sit to supine: Max assist, +2 for physical assistance, +2 for safety/equipment, HOB elevated, Mod assist        Transfers Overall transfer level: Needs assistance   Transfers: Sit to/from Stand Sit to Stand: Mod assist, +2 physical assistance, +2 safety/equipment           General transfer comment: step by step -frequent multi modal cues for technique      Balance Overall balance assessment: Needs assistance Sitting-balance support: Feet supported Sitting balance-Leahy Scale: Poor     Standing balance support: Bilateral upper extremity supported, During functional activity, Reliant on assistive device for balance Standing balance-Leahy Scale: Poor                             ADL either performed or assessed with clinical judgement   ADL Overall ADL's : Needs assistance/impaired             Lower Body Bathing: Maximal assistance;Sit to/from stand   Upper Body Dressing : Sitting;Maximal assistance Upper Body Dressing Details (indicate cue type and reason): doff/donn gown 2x Lower Body Dressing: Maximal assistance Lower  Body Dressing Details (indicate cue type and reason): donn/doff socks, donn/doff brief Toilet Transfer: Moderate assistance Toilet Transfer Details (indicate cue type and reason): +2 HHA to bedside commode Toileting- Clothing Manipulation and Hygiene: Maximal assistance;Total assistance;Sit to/from stand Toileting - Clothing Manipulation Details (indicate  cue type and reason): incontinent urine, unware she had urinated     Functional mobility during ADLs: +2 for physical assistance;+2 for safety/equipment;Moderate assistance (approx 4') General ADL Comments: step by step-frequent multi modal cues for participation     Vision   Additional Comments: will continue to assess     Perception         Praxis         Pertinent Vitals/Pain Pain Assessment Pain Assessment: Faces Faces Pain Scale: Hurts a little bit Pain Location: generalized Pain Intervention(s): Monitored during session     Extremity/Trunk Assessment Upper Extremity Assessment Upper Extremity Assessment: Generalized weakness;LUE deficits/detail;Difficult to assess due to impaired cognition LUE Deficits / Details: recent L wrist fracture, immobilized   Lower Extremity Assessment Lower Extremity Assessment: Generalized weakness       Communication Communication Communication: Impaired Factors Affecting Communication: Difficulty expressing self   Cognition Arousal: Alert Behavior During Therapy: Flat affect Cognition: Cognition impaired   Orientation impairments: Place, Time, Situation Awareness: Intellectual awareness impaired, Online awareness impaired Memory impairment (select all impairments): Short-term memory, Working [employer] fast streamer, Non-declarative long-term memory, Geneticist, molecular long-term memory Attention impairment (select first level of impairment): Focused attention Executive functioning impairment (select all impairments): Initiation, Organization, Reasoning, Sequencing, Problem solving                   Following commands: Impaired Following commands impaired: Follows one step commands inconsistently     Cueing  General Comments   Cueing Techniques: Verbal cues;Gestural cues;Tactile cues;Visual cues  vss throughout, R skin tear noted, nurse in room to assess post session   Exercises Other Exercises Other Exercises: edu re: role of OT, role  of rehab, discharge recommendations with daugther in law and pt   Shoulder Instructions      Home Living Family/patient expects to be discharged to:: Private residence Living Arrangements: Alone (son/DIL will come stay over night with her) Available Help at Discharge: Family;Available PRN/intermittently Type of Home: Apartment Home Access: Level entry     Home Layout: One level     Bathroom Shower/Tub: Walk-in shower         Home Equipment: Rollator (4 wheels);Grab bars - tub/shower;Grab bars - toilet;Cane - single point          Prior Functioning/Environment Prior Level of Function : Independent/Modified Independent             Mobility Comments: fall getting out of car, with broken L arm. History of other remote falls. Using rollator for about 1 year ADLs Comments: MOD I-I in ADL/IADL, was driving; had assist from family/friends for IADLs    OT Problem List: Decreased strength;Impaired balance (sitting and/or standing);Decreased cognition;Decreased knowledge of precautions;Decreased range of motion;Decreased safety awareness;Decreased activity tolerance;Decreased knowledge of use of DME or AE;Impaired UE functional use   OT Treatment/Interventions: Self-care/ADL training;Therapeutic exercise;Patient/family education;Balance training;Energy conservation;Therapeutic activities;DME and/or AE instruction;Cognitive remediation/compensation      OT Goals(Current goals can be found in the care plan section)   Acute Rehab OT Goals Patient Stated Goal: improve function OT Goal Formulation: With patient/family Time For Goal Achievement: 11/22/23 Potential to Achieve Goals: Good ADL Goals Pt Will Perform Grooming: with supervision Pt Will Perform Lower Body Dressing: sitting/lateral leans;sit to/from stand;with  supervision Pt Will Transfer to Toilet: with supervision;ambulating Pt Will Perform Toileting - Clothing Manipulation and hygiene: with supervision;sitting/lateral  leans;sit to/from stand   OT Frequency:  Min 2X/week    Co-evaluation PT/OT/SLP Co-Evaluation/Treatment: Yes Reason for Co-Treatment: For patient/therapist safety;To address functional/ADL transfers;Necessary to address cognition/behavior during functional activity   OT goals addressed during session: ADL's and self-care      AM-PAC OT "6 Clicks" Daily Activity     Outcome Measure Help from another person eating meals?: A Lot Help from another person taking care of personal grooming?: A Lot Help from another person toileting, which includes using toliet, bedpan, or urinal?: Total Help from another person bathing (including washing, rinsing, drying)?: A Lot Help from another person to put on and taking off regular upper body clothing?: A Lot Help from another person to put on and taking off regular lower body clothing?: A Lot 6 Click Score: 11   End of Session Equipment Utilized During Treatment: Gait belt Nurse Communication: Mobility status  Activity Tolerance: Patient tolerated treatment well Patient left: in bed;with call bell/phone within reach;with bed alarm set;with family/visitor present;with nursing/sitter in room  OT Visit Diagnosis: Other abnormalities of gait and mobility (R26.89);Muscle weakness (generalized) (M62.81)                Time: 6045-4098 OT Time Calculation (min): 34 min Charges:  OT General Charges $OT Visit: 1 Visit OT Evaluation $OT Eval Moderate Complexity: 1 Mod  Oleta Mouse, OTD OTR/L  11/08/23, 1:55 PM

## 2023-11-08 NOTE — Progress Notes (Signed)
 PROGRESS NOTE  Morgan Bowen  EGB:[id number] DOB: 01-24-1936 DOA: 11/07/2023 PCP: Doreene Nest, NP   Brief Narrative: Patient is a 88 year old female  with medical history significant of HTN, HLD, DM, depression with anxiety, anemia, DVT not on anticoagulants, recent left wrist fracture, who presents with AMS, slurred speech, right facial droop.  Usually she is alert and oriented.  On presentation, she was hypertensive.  CT head did not show any acute findings.  CTA head and neck did not show any large vessel occlusion.  MRI showed no acute findings but showed chronic bilateral subdural hematoma .neurology also consulted and following.  PT/OT consulted  Assessment & Plan:  Principal Problem:   Acute metabolic encephalopathy Active Problems:   SDH (subdural hematoma) (HCC)   Type 2 diabetes mellitus with other specified complication (HCC)   HLD (hyperlipidemia)   Essential hypertension   Microcytic anemia   Nausea vomiting and diarrhea   Anxiety and depression   Left wrist fracture   Acute metabolic encephalopathy: Etiology is not clear.  Potential differential diagnosis includes tramadol side effects and TIA.  Case discussed with neurology, stroke workup initiated.  MRI did not show any acute findings but showed bilateral chronic subdural hematomas Currently on Lipitor.pt had A1c 8.8 on 08/16/2023, will not repeat.  - PT/OT /speech consulted -Neurology following   Type 2 diabetes mellitus : Recent A1c 8.8, poorly controlled.  Patient taking metformin and Amaryl -SSI.  Monitor blood sugars   HLD  -Started Lipitor 40 mg daily   Essential hypertension -held amlodipine, irbesartan, metoprolol due to possible TIA -Continue as needed medications for severe hypertension   Microcytic anemia: Hemoglobin 12.6 on 01/11/2022 --> 8.4.  Per family, no dark stool or rectal bleeding noted. -Iron panel shows low iron of 25, vitamin B12 pending.  Will give her a dose of IV iron.Checking FOBT.   Low suspicion for GI bleed   Nausea vomiting and diarrhea: Etiology is not clear.  CT of abdomen/pelvis is negative for acute issues. -As needed Zofran -Check C. Difficile   Anxiety and depression: -As needed Hadol for agitation   H/O Left wrist fracture: -As needed morphine, Percocet, Tylenol for pain -As needed Robaxin -Seen in ED for this few  days ago.  Needs follow-up with orthopedics as an outpatient   Back pain: -Lidoderm patch        DVT prophylaxis:enoxaparin (LOVENOX) injection 40 mg Start: 11/08/23 1000     Code Status: Full Code  Family Communication: Discussed with son at beside  Patient status:obs  Patient is from :home  Anticipated discharge PX:TGGY vs SnF  Estimated DC date: Not sure, awaiting improvement in the mentation, PT/OT evaluation   Consultants:  Neurology Procedures: None  Antimicrobials:  Anti-infectives (From admission, onward)    None       Subjective: Patient seen and examined at the bedside.  Hemodynamically stable.  Lying in bed.  Confused.  Not agitated.  Son at bedside.  As per son, she is not confused at baseline.  She took 2 doses of tramadol.  Not in distress  Objective: Vitals:   11/07/23 2345 11/08/23 0030 11/08/23 0200 11/08/23 0516  BP:  (!) 168/76 (!) 146/64   Pulse: 88 87 80   Resp: (!) 21 17 17    Temp:  98.2 F (36.8 C)  98 F (36.7 C)  TempSrc:  Oral  Oral  SpO2: 92% 93% 100%   Weight:       No intake or output data in  the 24 hours ending 11/08/23 0813 Filed Weights   11/07/23 2002  Weight: 54.4 kg    Examination:  General exam: Overall comfortable, not in distress, deconditioned, lying on bed, confused HEENT: PERRL Respiratory system:  no wheezes or crackles  Cardiovascular system: S1 & S2 heard, RRR.  Gastrointestinal system: Abdomen is nondistended, soft and nontender. Central nervous system: Alert and awake but not oriented Extremities: No edema, no clubbing ,no cyanosis, splint on the left  upper extremity Skin: Scattered bruises, no ulcers   Data Reviewed: I have personally reviewed following labs and imaging studies  CBC: Recent Labs  Lab 11/07/23 2038 11/08/23 0615  WBC 10.4 11.6*  NEUTROABS 7.5  --   HGB 8.4* 8.1*  HCT 26.5* 25.8*  MCV 77.9* 77.0*  PLT 325 187   Basic Metabolic Panel: Recent Labs  Lab 11/07/23 2038 11/08/23 0615  NA 130* 130*  K 4.3 4.1  CL 100 98  CO2 22 23  GLUCOSE 277* 290*  BUN 12 13  CREATININE 0.83 0.91  CALCIUM 8.3* 8.7*     No results found for this or any previous visit (from the past 240 hours).   Radiology Studies: MR BRAIN WO CONTRAST Result Date: 11/08/2023 CLINICAL DATA:  Altered mental status and facial droop EXAM: MRI HEAD WITHOUT CONTRAST TECHNIQUE: Multiplanar, multiecho pulse sequences of the brain and surrounding structures were obtained without intravenous contrast. COMPARISON:  None Available. FINDINGS: Brain: No acute infarct. Bilateral chronic subdural hematomas, measuring up to 4 mm. There is multifocal hyperintense T2-weighted signal within the white matter. Parenchymal volume and CSF spaces are normal. The midline structures are normal. Old left cerebellar and left basal ganglia small vessel infarcts. Vascular: Normal flow voids. Skull and upper cervical spine: Normal calvarium and skull base. Visualized upper cervical spine and soft tissues are normal. Sinuses/Orbits:No paranasal sinus fluid levels or advanced mucosal thickening. No mastoid or middle ear effusion. Normal orbits. IMPRESSION: 1. No acute intracranial abnormality. 2. Bilateral chronic subdural hematomas, measuring up to 4 mm. 3. Old left cerebellar and left basal ganglia small vessel infarcts. Electronically Signed   By: Deatra Robinson M.D.   On: 11/08/2023 02:57   CT ABDOMEN PELVIS WO CONTRAST Result Date: 11/08/2023 CLINICAL DATA:  Bowel obstruction EXAM: CT ABDOMEN AND PELVIS WITHOUT CONTRAST TECHNIQUE: Multidetector CT imaging of the abdomen and  pelvis was performed following the standard protocol without IV contrast. RADIATION DOSE REDUCTION: This exam was performed according to the departmental dose-optimization program which includes automated exposure control, adjustment of the mA and/or kV according to patient size and/or use of iterative reconstruction technique. COMPARISON:  None Available. FINDINGS: Lower chest: Moderate right coronary artery calcification. No acute abnormality. Hepatobiliary: No focal liver abnormality is seen. Status post cholecystectomy. No biliary dilatation. Pancreas: Atrophic but otherwise unremarkable Spleen: Unremarkable Adrenals/Urinary Tract: The adrenal glands are unremarkable. The kidneys are normal in size and position. Excreted contrast within the collecting system. No hydronephrosis. No definite intraluminal defect identified. Mild bilateral nonspecific perinephric inflammatory stranding. No perinephric fluid collections are seen. Bladder is mildly distended but otherwise unremarkable. Stomach/Bowel: Moderate to severe pancolonic diverticulosis without superimposed focal inflammatory change. Stomach, small bowel, and large bowel are otherwise unremarkable. Appendix absent. No free intraperitoneal gas or fluid. Vascular/Lymphatic: Aortic atherosclerosis. No enlarged abdominal or pelvic lymph nodes. Reproductive: Status post hysterectomy. No adnexal masses. Other: No abdominal wall hernia or abnormality. No abdominopelvic ascites. Musculoskeletal: Remote L1 superior endplate fracture with approximately 50% loss of height. Moderate thoracic dextroscoliosis, apex right  at L2. No acute bone abnormality. No lytic or blastic bone lesion. IMPRESSION: 1. No acute intra-abdominal pathology identified. No definite radiographic explanation for the patient's reported symptoms. 2. Moderate right coronary artery calcification. 3. Moderate to severe pancolonic diverticulosis without superimposed focal inflammatory change. 4. Remote  L1 superior endplate fracture with approximately 50% loss of height. Aortic Atherosclerosis (ICD10-I70.0). Electronically Signed   By: Helyn Numbers M.D.   On: 11/08/2023 00:31   CT ANGIO HEAD NECK W WO CM W PERF (CODE STROKE) Result Date: 11/07/2023 CLINICAL DATA:  Right-sided weakness and altered mental status EXAM: CT ANGIOGRAPHY HEAD AND NECK CT PERFUSION BRAIN TECHNIQUE: Multidetector CT imaging of the head and neck was performed using the standard protocol during bolus administration of intravenous contrast. Multiplanar CT image reconstructions and MIPs were obtained to evaluate the vascular anatomy. Carotid stenosis measurements (when applicable) are obtained utilizing NASCET criteria, using the distal internal carotid diameter as the denominator. Multiphase CT imaging of the brain was performed following IV bolus contrast injection. Subsequent parametric perfusion maps were calculated using RAPID software. RADIATION DOSE REDUCTION: This exam was performed according to the departmental dose-optimization program which includes automated exposure control, adjustment of the mA and/or kV according to patient size and/or use of iterative reconstruction technique. CONTRAST:  OMNIPAQUE IOHEXOL 350 MG/ML SOLN COMPARISON:  None Available. FINDINGS: CTA NECK FINDINGS Skeleton: No acute abnormality or high grade bony spinal canal stenosis. Other neck: Normal pharynx, larynx and major salivary glands. No cervical lymphadenopathy. Unremarkable thyroid gland. Upper chest: No pneumothorax or pleural effusion. No nodules or masses. Aortic arch: There is calcific atherosclerosis of the aortic arch. Conventional 3 vessel aortic branching pattern. RIGHT carotid system: No dissection, occlusion or aneurysm. Mild atherosclerotic calcification at the carotid bifurcation without hemodynamically significant stenosis. LEFT carotid system: No dissection, occlusion or aneurysm. Mild atherosclerotic calcification at the  carotid bifurcation without hemodynamically significant stenosis. Vertebral arteries: Codominant configuration. Mild stenosis of the right vertebral artery origin due to atherosclerotic calcification. The vertebral arteries are otherwise normal to the skull base. CTA HEAD FINDINGS POSTERIOR CIRCULATION: Bilateral atherosclerotic calcification without flow limiting stenosis. No proximal occlusion of the anterior or inferior cerebellar arteries. Basilar artery is normal. Superior cerebellar arteries are normal. Posterior cerebral arteries are normal. ANTERIOR CIRCULATION: Atherosclerotic calcification of the internal carotid arteries at the skull base without hemodynamically significant stenosis. Anterior cerebral arteries are normal. Middle cerebral arteries are normal. Venous sinuses: As permitted by contrast timing, patent. Anatomic variants: None Review of the MIP images confirms the above findings. CT Brain Perfusion Findings: ASPECTS: 10 CBF (<30%) Volume: 0mL Perfusion (Tmax>6.0s) volume: 3mL Mismatch Volume: 3mL Infarction Location:The 3 mL area of ischemia is likely artifactual given suboptimal time-enhancement curve. IMPRESSION: 1. No emergent large vessel occlusion. 2. Mild stenosis of the right vertebral artery origin due to atherosclerotic calcification. 3. Mild atherosclerotic calcification at the carotid bifurcations and skull base without hemodynamically significant stenosis. 4. A 3 mL area of ischemia on CT perfusion is likely artifactual given suboptimal time-enhancement curve. Electronically Signed   By: Deatra Robinson M.D.   On: 11/07/2023 21:27   CT HEAD CODE STROKE WO CONTRAST Result Date: 11/07/2023 CLINICAL DATA:  Code stroke.  Acute neurologic deficit EXAM: CT HEAD WITHOUT CONTRAST TECHNIQUE: Contiguous axial images were obtained from the base of the skull through the vertex without intravenous contrast. RADIATION DOSE REDUCTION: This exam was performed according to the departmental  dose-optimization program which includes automated exposure control, adjustment of the mA and/or kV  according to patient size and/or use of iterative reconstruction technique. COMPARISON:  11/06/2023 FINDINGS: Brain: Unchanged appearance of bilateral low-density extra-axial collections, most consistent with chronic subdural hematomas. The size and configuration of the ventricles and extra-axial CSF spaces are normal. There is hypoattenuation of the periventricular white matter, most commonly indicating chronic ischemic microangiopathy. Old right parietal infarct. Vascular: No abnormal hyperdensity of the major intracranial arteries or dural venous sinuses. No intracranial atherosclerosis. Skull: The visualized skull base, calvarium and extracranial soft tissues are normal. Sinuses/Orbits: No fluid levels or advanced mucosal thickening of the visualized paranasal sinuses. No mastoid or middle ear effusion. The orbits are normal. ASPECTS [facility] Stroke Program Early CT Score) - Ganglionic level infarction (caudate, lentiform nuclei, internal capsule, insula, M1-M3 cortex): 7 - Supraganglionic infarction (M4-M6 cortex): 3 Total score (0-10 with 10 being normal): 10 IMPRESSION: 1. No acute intracranial abnormality. 2. ASPECTS is 10. 3. Unchanged appearance of bilateral low-density extra-axial collections, most consistent with chronic subdural hematomas. These results were called by telephone at the time of interpretation on 11/07/2023 at 8:52 pm to provider DAVID [facility] , who verbally acknowledged these results. Electronically Signed   By: Deatra Robinson M.D.   On: 11/07/2023 20:55   DG Wrist 2 Views Left Result Date: 11/06/2023 CLINICAL DATA:  Left wrist fracture, closed reduction EXAM: LEFT WRIST - 2 VIEW COMPARISON:  11/06/2023 at 5:07 p.m. FINDINGS: Comminuted, impacted fracture of the distal left radius with fracture planes extending transversely into the distal radioulnar joint and longitudinally into the distal  radial articular surface is again identified. Radial tilt has been improved and the degree impaction has been improved with resolution of previously noted ulnar positive for variance. There is, however, articular incongruity of the distal radial articular surface with diastasis of the major fracture fragments by up to 2 mm, 1 shaft dorsal displacement and marked dorsal angulation of the distal radial fracture fragment and resultant dorsal tilt of the distal radial articular surface by up to 30 degrees. No dislocation. Osseous structures are diffusely osteopenic. External immobilizer applied. IMPRESSION: 1. Improved radial tilt and impaction with resolution of previously noted ulnar positive for variance. Persistent marked dorsal angulation and displacement of the distal radial fracture fragment with resultant dorsal tilt of the distal radial articular surface. Electronically Signed   By: Helyn Numbers M.D.   On: 11/06/2023 20:45   CT Cervical Spine Wo Contrast Result Date: 11/06/2023 CLINICAL DATA:  Neck trauma EXAM: CT CERVICAL SPINE WITHOUT CONTRAST TECHNIQUE: Multidetector CT imaging of the cervical spine was performed without intravenous contrast. Multiplanar CT image reconstructions were also generated. RADIATION DOSE REDUCTION: This exam was performed according to the departmental dose-optimization program which includes automated exposure control, adjustment of the mA and/or kV according to patient size and/or use of iterative reconstruction technique. COMPARISON:  None Available. FINDINGS: Alignment: Normal alignment. Skull base and vertebrae: No acute fracture. No primary bone lesion or focal pathologic process. Soft tissues and spinal canal: No prevertebral fluid or swelling. No visible canal hematoma. Disc levels: Disc spaces are maintained. Mild bilateral degenerative facet disease, left greater than right. Upper chest: No acute findings Other: Chest wall soft tissues are unremarkable. No acute bony  abnormality. IMPRESSION: No acute bony abnormality Electronically Signed   By: Charlett Nose M.D.   On: 11/06/2023 18:23   CT Head Wo Contrast Result Date: 11/06/2023 CLINICAL DATA:  Head trauma, minor (Age >= 65y) EXAM: CT HEAD WITHOUT CONTRAST TECHNIQUE: Contiguous axial images were obtained from the base of  the skull through the vertex without intravenous contrast. RADIATION DOSE REDUCTION: This exam was performed according to the departmental dose-optimization program which includes automated exposure control, adjustment of the mA and/or kV according to patient size and/or use of iterative reconstruction technique. COMPARISON:  07/14/2015 FINDINGS: Brain: There are bilateral low-density subdural fluid collections, 8 mm on the right and 4 mm on the left compatible with chronic subdural hematomas or hygromas. No acute hemorrhage or infarction. No hydrocephalus. There is atrophy and chronic small vessel disease changes. Vascular: No hyperdense vessel or unexpected calcification. Skull: No acute calvarial abnormality. Sinuses/Orbits: No acute findings Other: None IMPRESSION: Bilateral chronic subdural hematomas or hygromas. No mass effect or midline shift. Atrophy, chronic microvascular disease. No acute intracranial abnormality. Electronically Signed   By: Charlett Nose M.D.   On: 11/06/2023 18:22   DG Lumbar Spine 2-3 Views Result Date: 11/06/2023 CLINICAL DATA:  Pain after fall. EXAM: LUMBAR SPINE - 2-3 VIEW COMPARISON:  08/16/2023. FINDINGS: There are 5 nonrib-bearing lumbar vertebrae. There is exaggerated lumbar lordosis. Redemonstration of dextroscoliosis of the lumbar spine with apex at L2-3 disc level. No spondylolisthesis. Redemonstration of moderate anterior wedging deformity of L1 vertebral body, similar to the prior study. Remaining vertebral body heights are maintained. No aggressive osseous lesion. Moderate Multilevel degenerative changes in the form of facet arthropathy and marginal osteophyte  formation. Sacroiliac joints are symmetric. Visualized soft tissues are within normal limits. IMPRESSION: *No acute osseous abnormality of the lumbar spine. Electronically Signed   By: Jules Schick M.D.   On: 11/06/2023 18:09   DG Wrist Complete Left Result Date: 11/06/2023 CLINICAL DATA:  Fall, left arm deformity EXAM: LEFT WRIST - COMPLETE 3+ VIEW COMPARISON:  None Available. FINDINGS: There is a comminuted distal left radial metaphyseal fracture. Posterior angulation and displacement of the distal fragment. Displaced ulnar styloid fracture. Overlying soft tissue swelling. No subluxation or dislocation. IMPRESSION: Posteriorly displaced and angulated distal left radial and ulnar styloid fractures. Electronically Signed   By: Charlett Nose M.D.   On: 11/06/2023 17:24    Scheduled Meds:   stroke: early stages of recovery book   Does not apply Once   atorvastatin  40 mg Oral Daily   enoxaparin (LOVENOX) injection  40 mg Subcutaneous Q24H   insulin aspart  0-5 Units Subcutaneous QHS   insulin aspart  0-9 Units Subcutaneous TID WC   lidocaine  1 patch Transdermal Q24H   Continuous Infusions:  sodium chloride 40 mL/hr at 11/08/23 0023     LOS: 0 days   Burnadette Pop, MD [facility] P4/08/2023, 8:13 AM

## 2023-11-08 NOTE — Progress Notes (Addendum)
 SLP Cancellation Note  Patient Details Name: Morgan Bowen MRN: [id number] DOB: 1936/05/08   Cancelled treatment:       Reason Eval/Treat Not Completed: Patient not medically ready (chart reviewed; consulted ED NSG who has been w/ her this morning.)  Reviewed chart notes; noted Neurology note indicating "Differential includes stroke vs a toxic-metabolic encephalopathy".   Noted at admit, pt had experienced R sided weakness and slurred speech at home but by admit, "per family upon ems arrival pt strength appears WNL pt presents with AMS facial droop resolved @ this time report pt on new pain relief medication". Neurology consult note indicated pt was verbal and a/o x3; initially slow to respond but no aphasia per note. Also noted pt had had fall on 11/06/23 the day b/f resulting in a left wrist fx and hitting her head.  Today, pt has been less verbal w/ NSG; she is sleepy and awakens only briefly b/f falling asleep again.  ST services will hold on cognitive-communication evaluation at this time until pt is more fully alert to participate. Recommended general aspiration precautions and monitoring at meals; pills in Puree for safer swallowing. NSG updated, agreed. Will updated MD.     Morgan Som, MS, CCC-SLP Speech Language Pathologist Rehab Services; [facility] [phone] (ascom) Zameer Borman 11/08/2023, 1:54 PM

## 2023-11-08 NOTE — Evaluation (Signed)
 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      PMH significant for  HTN, HLD, DM, depression with anxiety, anemia, DVT not on anticoagulants, recent L wrist fracture  Clinical Impression  PT/OT co-evaluation performed this date. Pt is a pleasant 88 year old confused female who was admitted for acute metabolic encephalopathy. Pt sleeping upon arrival, but able to awaken with gentle cues. Pt performs bed mobility with max A +2 and transfers with mod A +2 to/from [facility] secondary to incontinence episode. Unsafe to ambulate at this time. Pt demonstrates deficits with strength/mobility/cognition. Post evaluation, discussion with MD via secure chat. Pt with cast applied to L UE- continued NWB. Will need platform attachment, MD to place order. Pt is currently not at baseline level. Would benefit from skilled PT to address above deficits and promote optimal return to PLOF. Pt will continue to receive skilled PT services while admitted and will defer to TOC/care team for updates regarding disposition planning.         If plan is discharge home, recommend the following: Two people to help with walking and/or transfers;Two people to help with bathing/dressing/bathroom;Supervision due to cognitive status   Can travel by private vehicle   No    Equipment Recommendations  (TBD)  Recommendations for Other Services       Functional Status Assessment Patient has had a recent decline in their functional status and demonstrates the ability to make significant improvements in function in a reasonable and predictable amount of time.     Precautions / Restrictions Precautions Precautions:  Fall Recall of Precautions/Restrictions: Impaired Required Braces or Orthoses:  (L UE cast) Restrictions Weight Bearing Restrictions Per Provider Order: Yes LUE Weight Bearing Per Provider Order: Non weight bearing Other Position/Activity Restrictions: wrist fracture- with cast applied. Assuming NWB, however no official order      Mobility  Bed Mobility Overal bed mobility: Needs Assistance Bed Mobility: Supine to Sit     Supine to sit: +2 for physical assistance, +2 for safety/equipment, Mod assist, Max assist Sit to supine: Max assist, Mod assist   General bed mobility comments: needed heavy cues for initiation and assist for trunkal elevation.Once seated, able to sit at EOB with cga.    Transfers Overall transfer level: Needs assistance Equipment used: 2 person hand held assist Transfers: Bed to chair/wheelchair/BSC, Sit to/from Stand Sit to Stand: Mod assist, +2 physical assistance, +2 safety/equipment   Step pivot transfers: Mod assist, +2 physical assistance, +2 safety/equipment       General transfer comment: able to stand with heavy post leaning, unable to self correct. Maintained L UE NWB. Upon standing, had incontient episode. Able to take steps over to [facility] and finish voiding prior to step pivot return to bed    Ambulation/Gait               General Gait Details: not safe to perform  Stairs            Wheelchair Mobility     Tilt Bed    Modified Rankin (Stroke Patients Only)       Balance Overall balance assessment: Needs assistance Sitting-balance support: Feet supported, Bilateral upper extremity supported Sitting balance-Leahy Scale: Poor  Standing balance support: Bilateral upper extremity supported Standing balance-Leahy Scale: Poor                               Pertinent Vitals/Pain Pain Assessment Pain Assessment: Faces Faces Pain Scale: Hurts a little bit Pain Location: generalized Pain Descriptors /  Indicators: Aching Pain Intervention(s): Limited activity within patient's tolerance, Repositioned    Home Living Family/patient expects to be discharged to:: Private residence Living Arrangements: Alone (son/DIL will come stay over night with her) Available Help at Discharge: Family;Available PRN/intermittently Type of Home: Apartment Home Access: Level entry       Home Layout: One level Home Equipment: Rollator (4 wheels);Grab bars - tub/shower;Grab bars - toilet;Cane - single point      Prior Function Prior Level of Function : Independent/Modified Independent             Mobility Comments: fall getting out of car, with broken L arm. History of other remote falls. Using rollator for about 1 year ADLs Comments: MOD I-I in ADL/IADL, was driving; had assist from family/friends for IADLs     Extremity/Trunk Assessment   Upper Extremity Assessment Upper Extremity Assessment: Generalized weakness;LUE deficits/detail;Difficult to assess due to impaired cognition LUE Deficits / Details: recent L wrist fracture, immobilized    Lower Extremity Assessment Lower Extremity Assessment: Generalized weakness       Communication   Communication Communication: Impaired Factors Affecting Communication: Difficulty expressing self    Cognition Arousal: Lethargic Behavior During Therapy: Flat affect   PT - Cognitive impairments: Orientation                       PT - Cognition Comments: sleeping upon arrival, able to awaken, but flat affect. Alert to self only Following commands: Impaired Following commands impaired: Follows one step commands inconsistently     Cueing Cueing Techniques: Verbal cues, Gestural cues, Tactile cues     General Comments General comments (skin integrity, edema, etc.): vss throughout, R skin tear noted, nurse in room to assess post session    Exercises Other Exercises Other Exercises: incontient episode in bed, assisted with transfer  bed->BSC in addition to hygiene, bed change. Pt with poor awareness   Assessment/Plan    PT Assessment Patient needs continued PT services  PT Problem List Decreased strength;Decreased activity tolerance;Decreased balance;Decreased mobility;Decreased coordination;Decreased cognition;Decreased knowledge of use of DME;Decreased safety awareness       PT Treatment Interventions DME instruction;Gait training;Stair training;Therapeutic exercise;Balance training    PT Goals (Current goals can be found in the Care Plan section)  Acute Rehab PT Goals Patient Stated Goal: unable to state PT Goal Formulation: With family Time For Goal Achievement: 11/22/23 Potential to Achieve Goals: Good    Frequency Min 2X/week     Co-evaluation PT/OT/SLP Co-Evaluation/Treatment: Yes Reason for Co-Treatment: For patient/therapist safety;To address functional/ADL transfers;Necessary to address cognition/behavior during functional activity PT goals addressed during session: Mobility/safety with mobility;Balance OT goals addressed during session: ADL's and self-care       AM-PAC PT "6 Clicks" Mobility  Outcome Measure Help needed turning from your back to your side while in a flat bed without using bedrails?: A Lot Help needed moving from lying on your back to sitting on the side of a flat bed without using bedrails?: A Lot Help needed moving to and from a bed to a chair (including a wheelchair)?: A Lot Help needed standing up from a  chair using your arms (e.g., wheelchair or bedside chair)?: A Lot Help needed to walk in hospital room?: Total Help needed climbing 3-5 steps with a railing? : Total 6 Click Score: 10    End of Session   Activity Tolerance: Patient limited by fatigue Patient left: in bed;with bed alarm set Nurse Communication: Mobility status PT Visit Diagnosis: Unsteadiness on feet (R26.81);Muscle weakness (generalized) (M62.81);History of falling (Z91.81);Difficulty in walking, not  elsewhere classified (R26.2);Other symptoms and signs involving the nervous system (R29.898)    Time: 1610-9604 PT Time Calculation (min) (ACUTE ONLY): 31 min   Charges:   PT Evaluation $PT Eval Moderate Complexity: 1 Mod   PT General Charges $$ ACUTE PT VISIT: 1 Visit         Elizabeth Palau, PT, DPT, GCS [phone]   Tamira Ryland 11/08/2023, 3:56 PM

## 2023-11-08 NOTE — ED Notes (Signed)
 CCMD called and pt placed on cardiac monitoring.

## 2023-11-08 NOTE — Inpatient Diabetes Management (Signed)
 Inpatient Diabetes Program Recommendations  AACE/ADA: New Consensus Statement on Inpatient Glycemic Control (2015)  Target Ranges:  Prepandial:   less than 140 mg/dL      Peak postprandial:   less than 180 mg/dL (1-2 hours)      Critically ill patients:  140 - 180 mg/dL   Lab Results  Component Value Date   GLUCAP 296 (H) 11/08/2023   HGBA1C 8.8 (A) 08/16/2023    Review of Glycemic Control  Latest Reference Range & Units 11/07/23 20:11 11/08/23 01:01 11/08/23 07:22  Glucose-Capillary 70 - 99 mg/dL 629 (H) 528 (H) 413 (H)   Diabetes history: DM 2 Outpatient Diabetes medications: Amaryl 4 mg BID, Metformin 1000 mg bid Current orders for Inpatient glycemic control:  Novolog 0-9 units tid + hs  A1c 8.8% on 1/7  Inpatient Diabetes Program Recommendations:    -   While inpatient consider Semglee 10 units Q24 hours  Thanks,  Christena Deem RN, MSN, BC-ADM Inpatient Diabetes Coordinator Team Pager [phone] (8a-5p)

## 2023-11-08 NOTE — Consult Note (Addendum)
 NEUROLOGY CONSULT NOTE   Date of service: November 08, 2023 Patient Name: Morgan Bowen MRN:  [id number] DOB:  [birth year] Chief Complaint: AMS, slurred speech and right facial droop.  Requesting Provider: Burnadette Pop, MD  History of Present Illness  Morgan Bowen is a 88 y.o. female with a PMHx of DM II, osteoporosis, DVT, HTN, and multiple recent falls (did not strike her head per son), who initially presented to the ED on Sunday afternoon via EMS after she fell backwards while getting out of her car, landing on her left outstretched hand and then on her bottom. She stated that she thought that she had also hit her head but denied any loss of consciousness. Imaging revealed an acute displaced and angulated distal left radial and ulnar styloid fracture. She underwent left wrist reduction and placement of a left long-arm orthoglass splint. She was then discharged home.   She re-presented on Monday night via EMS after family had noticed new onset of confusion acutely, in conjunction with slurred speech and right facial droop, but no obvious aphasia. Her right facial droop had resolved by the time she had arrived to the ED. She continued to be confused and disoriented on evaluation by Hospitalist. Her UA was negative, WBC 10.4, GFR > 60 and Hgb 8.4. She was afebrile with O2 sat of 94% on RA.   Teleneurology was consulted. NIHSS was 1 for dysarthria and TNK was deferred due to her mild deficits. CT did show chronic bilateral subdural fluid collections but they were unchanged from prior studies and likely incidental. CTA revealed no LVO. Teleneurology recommended admission for stroke work up including an MRI brain without contrast, lipid panel, hemoglobin AIC. Also recommended was initiation of ASA 81 mg every day once she passed her dysphagia screen.      ROS  As per HPI. Unable to obtain a comprehensive ROS due to her confusion and abulia.   Past History   Past Medical History:  Diagnosis Date    Acute cystitis 05/23/2018   Acute deep vein thrombosis (DVT) of right tibial vein (HCC) 04/03/2020   Anxiety    Arthritis    Bronchitis 06/17/2015   Cellulitis 12/13/2018   Chickenpox    COVID-19 virus infection 06/24/2022   Diabetes mellitus without complication (HCC)    Dizziness 01/15/2022   Fall 12/10/2022   GERD (gastroesophageal reflux disease)    Hypertension    Laceration of skin and cutaneous sensory nerve of left upper extremity 04/13/2022   Osteoporosis    Type 2 diabetes mellitus (HCC)    Urinary tract infection     Past Surgical History:  Procedure Laterality Date   ABDOMINAL HYSTERECTOMY  1984   ABDOMINAL HYSTERECTOMY     APPENDECTOMY  1958   CATARACT EXTRACTION Right 04/25/2023   CHOLECYSTECTOMY     GALLBLADDER SURGERY     MANDIBLE FRACTURE SURGERY  07/13/2016   TONSILLECTOMY  1958   TONSILLECTOMY     WRIST FRACTURE SURGERY Right    2000    Family History: Family History  Problem Relation Age of Onset   Hypertension Mother    Stroke Mother    Stroke Father    Hypertension Father    Hypertension Sister    Kidney disease Sister    Breast cancer Neg Hx     Social History  reports that she has never smoked. She has never used smokeless tobacco. She reports that she does not drink alcohol and does not use drugs.  Allergies  Allergen Reactions   Macrobid [Nitrofurantoin] Nausea Only    Pts son wanted noted that macrobid caused bad nausea   Sulfamethoxazole-Trimethoprim     Other reaction(s): Not available   Penicillins Rash    Medications   Current Facility-Administered Medications:     stroke: early stages of recovery book, , Does not apply, Once, Lorretta Harp, MD   0.9 %  sodium chloride infusion, , Intravenous, Continuous, Lorretta Harp, MD, Last Rate: 40 mL/hr at 11/08/23 0826, Rate Verify at 11/08/23 1610   acetaminophen (TYLENOL) tablet 650 mg, 650 mg, Oral, Q4H PRN **OR** acetaminophen (TYLENOL) 160 MG/5ML solution 650 mg, 650 mg, Per Tube,  Q4H PRN **OR** acetaminophen (TYLENOL) suppository 650 mg, 650 mg, Rectal, Q4H PRN, Lorretta Harp, MD   atorvastatin (LIPITOR) tablet 40 mg, 40 mg, Oral, Daily, Lorretta Harp, MD   enoxaparin (LOVENOX) injection 40 mg, 40 mg, Subcutaneous, Q24H, Lorretta Harp, MD   hydrALAZINE (APRESOLINE) injection 5 mg, 5 mg, Intravenous, Q2H PRN, Lorretta Harp, MD   insulin aspart (novoLOG) injection 0-5 Units, 0-5 Units, Subcutaneous, QHS, Lorretta Harp, MD, 5 Units at 11/08/23 0104   insulin aspart (novoLOG) injection 0-9 Units, 0-9 Units, Subcutaneous, TID WC, Lorretta Harp, MD, 5 Units at 11/08/23 9604   iron sucrose (VENOFER) 200 mg in sodium chloride 0.9 % 100 mL IVPB, 200 mg, Intravenous, Once, Adhikari, Amrit, MD   lidocaine (LIDODERM) 5 % 1 patch, 1 patch, Transdermal, Q24H, Lorretta Harp, MD, 1 patch at 11/08/23 0103   methocarbamol (ROBAXIN) tablet 500 mg, 500 mg, Oral, Q8H PRN, Lorretta Harp, MD   morphine (PF) 2 MG/ML injection 2 mg, 2 mg, Intravenous, Q4H PRN, Lorretta Harp, MD, 2 mg at 11/08/23 0103   ondansetron (ZOFRAN) injection 4 mg, 4 mg, Intravenous, Q8H PRN, Lorretta Harp, MD   oxyCODONE-acetaminophen (PERCOCET/ROXICET) 5-325 MG per tablet 1 tablet, 1 tablet, Oral, Q4H PRN, Lorretta Harp, MD  Current Outpatient Medications:    amLODipine (NORVASC) 10 MG tablet, TAKE ONE TABLET BY MOUTH ONCE DAILY FOR BLOOD PRESSURE (Patient taking differently: Take 10 mg by mouth daily. TAKE ONE TABLET BY MOUTH ONCE DAILY FOR BLOOD PRESSURE.), Disp: 90 tablet, Rfl: 2   ascorbic acid (VITAMIN C) 500 MG tablet, Take 500 mg by mouth daily., Disp: , Rfl:    Calcium-Magnesium-Vitamin D 500-250-125 MG-MG-UNIT TABS, Take 1 tablet by mouth daily., Disp: , Rfl:    co-enzyme Q-10 30 MG capsule, Take 30 mg by mouth 3 (three) times daily., Disp: , Rfl:    glimepiride (AMARYL) 4 MG tablet, Take 1 tablet (4 mg total) by mouth 2 (two) times daily. for diabetes., Disp: 180 tablet, Rfl: 0   irbesartan (AVAPRO) 300 MG tablet, TAKE ONE TABLET BY MOUTH ONCE  DAILY FOR BLOOD PRESSURE (Patient taking differently: Take 300 mg by mouth daily.), Disp: 90 tablet, Rfl: 2   metFORMIN (GLUCOPHAGE) 1000 MG tablet, TAKE 1 TABLET BY MOUTH TWICE DAILY WITH A MEAL FOR DIABETES (Patient taking differently: Take 1,000 mg by mouth 2 (two) times daily with a meal.), Disp: 180 tablet, Rfl: 0   metoprolol succinate (TOPROL-XL) 25 MG 24 hr tablet, TAKE ONE TABLET BY MOUTH TWICE DAILY FOR blood pressure., Disp: 180 tablet, Rfl: 2   Omega-3 Fatty Acids (FISH OIL) 300 MG CAPS, Take by mouth., Disp: , Rfl:    traMADol (ULTRAM) 50 MG tablet, Take 1 tablet (50 mg total) by mouth every 8 (eight) hours as needed., Disp: 12 tablet, Rfl: 0   Vitamin D, Ergocalciferol, (DRISDOL) 1.25 MG (  50000 UNIT) CAPS capsule, TAKE 1 CAPSULE BY MOUTH ONCE WEEKLY, Disp: 12 capsule, Rfl: 0   glucose blood (ONE TOUCH ULTRA TEST) test strip, USE AS DIRECTED TEST 3 TIMES DAILY E11.9, Disp: 100 each, Rfl: 2  Vitals   Vitals:   12/03/2023 2345 11/08/23 0030 11/08/23 0200 11/08/23 0516  BP:  (!) 168/76 (!) 146/64   Pulse: 88 87 80   Resp: (!) 21 17 17    Temp:  98.2 F (36.8 C)  98 F (36.7 C)  TempSrc:  Oral  Oral  SpO2: 92% 93% 100%   Weight:        Body mass index is 19.97 kg/m.  Physical Exam   Physical Exam HEENT- [place]/AT   Lungs- Respirations unlabored Extremities- Warm and well-perfused  Neurological Examination Mental Status: Awake with mildly decreased level of alertness. Abulic with sparse speech, but follows all commands. Increased latencies of verbal and motor responses. No definite dysfluency. Simple naming intact. Oriented to self, the year, the month and the state, but not to the day of the week or the city she is in.  Cranial Nerves: II: Temporal visual fields intact with no extinction to DSS. PERRL. III,IV, VI: No ptosis. EOMI. No nystagmus. V: Temp sensation equal bilaterally VII: Mild right facial droop VIII: Hearing intact to voice IX,X: Mild hypophonia XI:  Symmetric XII: Midline tongue extension Motor: RUE: 4+/5 proximally and distally LUE: 4-/5 in the context of a heavy dressing to her left arm RLE: 4+/5 LLE: 4+/5 Bradykinetic LUE movements Sensory: FT intact x 4.  Deep Tendon Reflexes: 1+ and symmetric bilateral patellae Plantars: Right: downgoingLeft: downgoing Cerebellar: No ataxia with FNF on the right. Unable to perform on the left.  Gait: Deferred  Labs/Imaging/Neurodiagnostic studies   CBC:  Recent Labs  Lab 12-03-2023 2038 11/08/23 0615  WBC 10.4 11.6*  NEUTROABS 7.5  --   HGB 8.4* 8.1*  HCT 26.5* 25.8*  MCV 77.9* 77.0*  PLT 325 187   Basic Metabolic Panel:  Lab Results  Component Value Date   NA 130 (L) 11/08/2023   K 4.1 11/08/2023   CO2 23 11/08/2023   GLUCOSE 290 (H) 11/08/2023   BUN 13 11/08/2023   CREATININE 0.91 11/08/2023   CALCIUM 8.7 (L) 11/08/2023   GFRNONAA >60 11/08/2023   GFRAA >60 04/13/2020   Lipid Panel:  Lab Results  Component Value Date   LDLCALC 94 11/08/2023   HgbA1c:  Lab Results  Component Value Date   HGBA1C 8.8 (A) 08/16/2023   Urine Drug Screen:     Component Value Date/Time   LABOPIA NONE DETECTED 12/03/2023 2038   COCAINSCRNUR NONE DETECTED 2023-12-03 2038   LABBENZ NONE DETECTED Dec 03, 2023 2038   AMPHETMU NONE DETECTED 2023/12/03 2038   THCU NONE DETECTED 12-03-23 2038   LABBARB NONE DETECTED Dec 03, 2023 2038    Alcohol Level     Component Value Date/Time   ETH <10 December 03, 2023 2121   INR  Lab Results  Component Value Date   INR 1.1 December 03, 2023   APTT  Lab Results  Component Value Date   APTT 26 12-03-23     ASSESSMENT  88 year old female presenting with acute onset of confusion and right facial droop approximately 24 hours after she had been treated for a LUE fracture secondary to a fall, during which she may have struck her head.  - Exam reveals a frail-appearing elderly female who is awake with mildly decreased level of alertness. She is abulic with  sparse speech, but  follows all commands. Increased latencies of verbal and motor responses are noted, with no definite dysfluency. Simple naming intact. Oriented to self, the year, the month and the state, but not to the day of the week or the city she is in. Mild right facial droop noted. No focal weakness noted other than to her LUE in the context of the heavy left arm orthoglass splint that she is wearing.  - MRI brain: No acute intracranial abnormality. Bilateral chronic subdural hematomas, measuring up to 4 mm. Old left cerebellar and left basal ganglia small vessel infarcts. - CTA of head and neck: No emergent large vessel occlusion. Mild stenosis of the right vertebral artery origin due to atherosclerotic calcification. Mild atherosclerotic calcification at the carotid bifurcations and skull base without hemodynamically significant stenosis. A 3 mL area of ischemia on CT perfusion is likely artifactual given suboptimal time-enhancement curve. - Labs: - Vitamin B12 level is low at 85, consistent with severe deficiency.  - Mild leukocytosis - U/A with elevated leukocytes, but negative nitrite - AST and ALT normal.  - BUN and Cr normal.  - Other than PRN Tramadol, no medications with significant potential to reduce the seizure threshold are seen on her home medications list. Also not on any deliriogenic meds.  - Overall impression: Acute onset of confusion and right facial droop, possibly secondary to TIA. Given her fall, a postconcussive syndrome is relatively high on the DDx. Low B12 level is noted. She also has diffuse cerebral atrophy, which could decrease her neurological reserve. Subclinical seizure with postictal state is also on the DDx.   RECOMMENDATIONS  - Vitamin B12 repletion with 1000 mcg SQ every day x 1 week, then 2000 mcg po every day indefinitely thereafter. Will need a repeat level drawn in 1 month.  - Ammonia level (ordered) - EEG in the AM (ordered) - PT/OT/Speech - TTE -  Cardiac telemetry - Discontinue her PRN home Tramadol (has not been restarted this admission).  - Frequent neuro checks - Given her R > L chronic subdural hematomas (right appears on MRI to be more recent than left, based on the signal characteristics), would not start ASA. Risks of hematoma expansion are felt to significantly outweigh the benefits of ASA for stroke prophylaxis.   ______________________________________________________________________    Dessa Phi, Nasir Bright, MD [facility]

## 2023-11-09 ENCOUNTER — Inpatient Hospital Stay

## 2023-11-09 DIAGNOSIS — G9341 Metabolic encephalopathy: Secondary | ICD-10-CM | POA: Diagnosis not present

## 2023-11-09 DIAGNOSIS — E1169 Type 2 diabetes mellitus with other specified complication: Secondary | ICD-10-CM | POA: Diagnosis not present

## 2023-11-09 DIAGNOSIS — R569 Unspecified convulsions: Secondary | ICD-10-CM

## 2023-11-09 DIAGNOSIS — R4182 Altered mental status, unspecified: Secondary | ICD-10-CM

## 2023-11-09 DIAGNOSIS — S52532A Colles' fracture of left radius, initial encounter for closed fracture: Secondary | ICD-10-CM | POA: Diagnosis not present

## 2023-11-09 DIAGNOSIS — S065XAA Traumatic subdural hemorrhage with loss of consciousness status unknown, initial encounter: Secondary | ICD-10-CM | POA: Diagnosis not present

## 2023-11-09 DIAGNOSIS — D519 Vitamin B12 deficiency anemia, unspecified: Secondary | ICD-10-CM | POA: Insufficient documentation

## 2023-11-09 DIAGNOSIS — D513 Other dietary vitamin B12 deficiency anemia: Secondary | ICD-10-CM | POA: Diagnosis not present

## 2023-11-09 LAB — BASIC METABOLIC PANEL WITH GFR
Anion gap: 8 (ref 5–15)
BUN: 18 mg/dL (ref 8–23)
CO2: 24 mmol/L (ref 22–32)
Calcium: 8.3 mg/dL — ABNORMAL LOW (ref 8.9–10.3)
Chloride: 94 mmol/L — ABNORMAL LOW (ref 98–111)
Creatinine, Ser: 0.91 mg/dL (ref 0.44–1.00)
GFR, Estimated: >60 mL/min (ref >60)
Glucose, Bld: 81 mg/dL (ref 70–99)
Potassium: 3.1 mmol/L — ABNORMAL LOW (ref 3.5–5.1)
Sodium: 126 mmol/L — ABNORMAL LOW (ref 135–145)

## 2023-11-09 LAB — CBC
HCT: 21.8 % — ABNORMAL LOW (ref 36.0–46.0)
Hemoglobin: 7.1 g/dL — ABNORMAL LOW (ref 12.0–15.0)
MCH: 24.5 pg — ABNORMAL LOW (ref 26.0–34.0)
MCHC: 32.6 g/dL (ref 30.0–36.0)
MCV: 75.2 fL — ABNORMAL LOW (ref 80.0–100.0)
Platelets: 317 10*3/uL (ref 150–400)
RBC: 2.9 MIL/uL — ABNORMAL LOW (ref 3.87–5.11)
RDW: 15.2 % (ref 11.5–15.5)
WBC: 8.9 10*3/uL (ref 4.0–10.5)
nRBC: 0 % (ref 0.0–0.2)

## 2023-11-09 LAB — GLUCOSE, CAPILLARY
Glucose-Capillary: 82 mg/dL (ref 70–99)
Glucose-Capillary: 129 mg/dL — ABNORMAL HIGH (ref 70–99)
Glucose-Capillary: 209 mg/dL — ABNORMAL HIGH (ref 70–99)
Glucose-Capillary: 192 mg/dL — ABNORMAL HIGH (ref 70–99)

## 2023-11-09 MED ORDER — SODIUM CHLORIDE 1 G PO TABS
1.0000 g | ORAL_TABLET | Freq: Two times a day (BID) | ORAL | Status: DC
  Administered 2023-11-09 (×2): 1 g via ORAL
  Filled 2023-11-09 (×2): qty 1

## 2023-11-09 MED ORDER — CEFAZOLIN SODIUM-DEXTROSE 2-4 GM/100ML-% IV SOLN
2.0000 g | INTRAVENOUS | Status: DC
Start: 2023-11-10 — End: 2023-11-15

## 2023-11-09 MED ORDER — POTASSIUM CHLORIDE CRYS ER 20 MEQ PO TBCR
40.0000 meq | EXTENDED_RELEASE_TABLET | ORAL | Status: AC
  Administered 2023-11-09 (×2): 40 meq via ORAL
  Filled 2023-11-09 (×2): qty 2

## 2023-11-09 MED ORDER — PANCRELIPASE (LIP-PROT-AMYL) 12000-38000 UNITS PO CPEP
24000.0000 [IU] | ORAL_CAPSULE | Freq: Three times a day (TID) | ORAL | Status: DC
  Administered 2023-11-09 – 2023-11-16 (×17): 24000 [IU] via ORAL
  Filled 2023-11-09 (×18): qty 2

## 2023-11-09 MED ORDER — POTASSIUM CHLORIDE CRYS ER 20 MEQ PO TBCR
40.0000 meq | EXTENDED_RELEASE_TABLET | Freq: Once | ORAL | Status: AC
  Administered 2023-11-09: 40 meq via ORAL
  Filled 2023-11-09: qty 2

## 2023-11-09 MED ORDER — QUETIAPINE FUMARATE 25 MG PO TABS
25.0000 mg | ORAL_TABLET | Freq: Every day | ORAL | Status: DC
  Administered 2023-11-09 – 2023-11-14 (×6): 25 mg via ORAL
  Filled 2023-11-09 (×6): qty 1

## 2023-11-09 NOTE — Plan of Care (Signed)

## 2023-11-09 NOTE — Progress Notes (Signed)
 SLP Cancellation Note  Patient Details Name: Morgan Bowen MRN: [id number] DOB: [birth year]   Cancelled treatment:       Reason Eval/Treat Not Completed: Patient at procedure or test/unavailable   Tianne Plott 11/09/2023, 4:29 PM

## 2023-11-09 NOTE — Progress Notes (Signed)
   11/09/23 1000  Stool Characteristics  Stool Type Type 1 (Separate hard lumps)  Has the patient had three Type 7 stools in the last 24 hours? No  Stool Descriptors Black  Stool Amount Small  Stool Source Rectum   Notified MD verbal to DC cdiff order

## 2023-11-09 NOTE — Progress Notes (Signed)
 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      PMH significant for  HTN, HLD, DM, depression with anxiety, anemia, DVT not on anticoagulants, recent L wrist fracture    PT Comments  Pt is making improved progress towards goals with ability to ambulate in room. Trialed platform RW in addition to HHA. Pt with improved balance with HHA as she is unable to grip platform.  More alert this session and able to carry conversation. Appears with post concussive symptoms, complaining of post head pain. Will continue to progress as able.   If plan is discharge home, recommend the following: A lot of help with walking and/or transfers;A lot of help with bathing/dressing/bathroom;Assist for transportation;Help with stairs or ramp for entrance   Can travel by private vehicle     Yes  Equipment Recommendations   (TBD)    Recommendations for Other Services       Precautions / Restrictions Precautions Precautions: Fall Recall of Precautions/Restrictions: Intact Required Braces or Orthoses:  (L UE cast) Restrictions Weight Bearing Restrictions Per Provider Order: Yes LUE Weight Bearing Per Provider Order: Non weight bearing Other Position/Activity Restrictions: wrist fracture- with cast applied. Assuming NWB, however no official order     Mobility  Bed Mobility Overal bed mobility: Needs Assistance Bed Mobility: Supine to Sit, Sit to Supine     Supine to sit: Min assist Sit to supine: Mod assist   General bed mobility comments: needs assist for moving B LEs across bed in addition to positioning once in bed. Complains of post headache, ice applied. Once seated, upright posture  and able to maintain correct NWB    Transfers Overall transfer level: Needs assistance Equipment used: 1 person hand held assist Transfers: Sit to/from Stand Sit to Stand: Mod assist           General transfer comment: able to stand with cues. Attempted with L platform RW and then again with HHA. RW adjusted to pt height. Able to maintain static balance    Ambulation/Gait Ambulation/Gait assistance: Min assist Gait Distance (Feet): 25 Feet Assistive device: 1 person hand held assist Gait Pattern/deviations: Step-through pattern       General Gait Details: ambulated short distance with platform RW, but unable to steer due to cast covering up all but 2 fingers and pain with pressure on arm rest. 2nd bout of mobility performed with HHA with improved comfort. No formal LOB, but dizziness and unsteadiness noted. Pain in post head   Stairs             Wheelchair Mobility     Tilt Bed    Modified Rankin (Stroke Patients Only)       Balance Overall balance assessment: Needs assistance Sitting-balance support: Feet supported Sitting balance-Leahy Scale: Good     Standing balance support: Bilateral upper extremity supported, During functional activity, Reliant on assistive device for balance Standing balance-Leahy Scale: Fair                              Musician Communication: No apparent difficulties  Cognition Arousal: Alert Behavior During Therapy: WFL for tasks assessed/performed   PT -  Cognitive impairments: History of cognitive impairments                       PT - Cognition Comments: improved cognition this date. Has no recall of previous session but alert to self/location/month. Still demonstrates decreased safety awareness Following commands: Impaired Following commands impaired: Follows one step commands inconsistently    Cueing Cueing Techniques: Verbal cues, Gestural cues, Tactile cues, Visual cues   Exercises      General Comments        Pertinent Vitals/Pain Pain Assessment Pain Assessment: Faces Faces Pain Scale: Hurts a little bit Pain Location: L elbow Pain Descriptors / Indicators: Aching Pain Intervention(s): Limited activity within patient's tolerance, Repositioned, Ice applied    Home Living                          Prior Function            PT Goals (current goals can now be found in the care plan section) Acute Rehab PT Goals Patient Stated Goal: unable to state PT Goal Formulation: With family Time For Goal Achievement: 11/22/23 Potential to Achieve Goals: Good Progress towards PT goals: Progressing toward goals    Frequency    Min 2X/week      PT Plan      Co-evaluation              AM-PAC PT "6 Clicks" Mobility   Outcome Measure  Help needed turning from your back to your side while in a flat bed without using bedrails?: A Little Help needed moving from lying on your back to sitting on the side of a flat bed without using bedrails?: A Little Help needed moving to and from a bed to a chair (including a wheelchair)?: A Little Help needed standing up from a chair using your arms (e.g., wheelchair or bedside chair)?: A Little Help needed to walk in hospital room?: A Lot Help needed climbing 3-5 steps with a railing? : A Lot 6 Click Score: 16    End of Session Equipment Utilized During Treatment: Gait belt Activity Tolerance: Patient limited by fatigue Patient left: in bed;with bed alarm set Nurse Communication: Mobility status PT Visit Diagnosis: Unsteadiness on feet (R26.81);Muscle weakness (generalized) (M62.81);History of falling (Z91.81);Difficulty in walking, not elsewhere classified (R26.2);Other symptoms and signs involving the nervous system (R29.898)     Time: 1610-9604 PT Time Calculation (min) (ACUTE ONLY): 18 min  Charges:    $Gait Training: 8-22 mins PT General Charges $$ ACUTE PT VISIT: 1 Visit                      Morgan Bowen, PT, DPT, GCS [phone]    Morgan Bowen 11/09/2023, 4:30 PM

## 2023-11-09 NOTE — Plan of Care (Signed)
 Problem: Education: Goal: Knowledge of disease or condition will improve 11/09/2023 1615 by Earleen Newport, RN Outcome: Progressing 11/09/2023 1612 by Earleen Newport, RN Outcome: Progressing Goal: Knowledge of secondary prevention will improve (MUST DOCUMENT ALL) 11/09/2023 1615 by Earleen Newport, RN Outcome: Progressing 11/09/2023 1612 by Earleen Newport, RN Outcome: Progressing Goal: Knowledge of patient specific risk factors will improve (DELETE if not current risk factor) 11/09/2023 1615 by Earleen Newport, RN Outcome: Progressing 11/09/2023 1612 by Earleen Newport, RN Outcome: Progressing   Problem: Ischemic Stroke/TIA Tissue Perfusion: Goal: Complications of ischemic stroke/TIA will be minimized 11/09/2023 1615 by Earleen Newport, RN Outcome: Progressing 11/09/2023 1612 by Earleen Newport, RN Outcome: Progressing   Problem: Coping: Goal: Will verbalize positive feelings about self 11/09/2023 1615 by Earleen Newport, RN Outcome: Progressing 11/09/2023 1612 by Earleen Newport, RN Outcome: Progressing Goal: Will identify appropriate support needs 11/09/2023 1615 by Earleen Newport, RN Outcome: Progressing 11/09/2023 1612 by Earleen Newport, RN Outcome: Progressing   Problem: Health Behavior/Discharge Planning: Goal: Ability to manage health-related needs will improve 11/09/2023 1615 by Earleen Newport, RN Outcome: Progressing 11/09/2023 1612 by Earleen Newport, RN Outcome: Progressing Goal: Goals will be collaboratively established with patient/family 11/09/2023 1615 by Earleen Newport, RN Outcome: Progressing 11/09/2023 1612 by Earleen Newport, RN Outcome: Progressing   Problem: Self-Care: Goal: Ability to participate in self-care as condition permits will improve 11/09/2023 1615 by Earleen Newport, RN Outcome: Progressing 11/09/2023 1612 by Earleen Newport, RN Outcome: Progressing Goal: Verbalization of feelings and concerns over difficulty with self-care will  improve 11/09/2023 1615 by Earleen Newport, RN Outcome: Progressing 11/09/2023 1612 by Earleen Newport, RN Outcome: Progressing Goal: Ability to communicate needs accurately will improve 11/09/2023 1615 by Earleen Newport, RN Outcome: Progressing 11/09/2023 1612 by Earleen Newport, RN Outcome: Progressing   Problem: Nutrition: Goal: Risk of aspiration will decrease 11/09/2023 1615 by Earleen Newport, RN Outcome: Progressing 11/09/2023 1612 by Earleen Newport, RN Outcome: Progressing Goal: Dietary intake will improve 11/09/2023 1615 by Earleen Newport, RN Outcome: Progressing 11/09/2023 1612 by Earleen Newport, RN Outcome: Progressing   Problem: Education: Goal: Ability to describe self-care measures that may prevent or decrease complications (Diabetes Survival Skills Education) will improve 11/09/2023 1615 by Earleen Newport, RN Outcome: Progressing 11/09/2023 1612 by Earleen Newport, RN Outcome: Progressing Goal: Individualized Educational Video(s) 11/09/2023 1615 by Earleen Newport, RN Outcome: Progressing 11/09/2023 1612 by Earleen Newport, RN Outcome: Progressing   Problem: Coping: Goal: Ability to adjust to condition or change in health will improve 11/09/2023 1615 by Earleen Newport, RN Outcome: Progressing 11/09/2023 1612 by Earleen Newport, RN Outcome: Progressing   Problem: Fluid Volume: Goal: Ability to maintain a balanced intake and output will improve 11/09/2023 1615 by Earleen Newport, RN Outcome: Progressing 11/09/2023 1612 by Earleen Newport, RN Outcome: Progressing   Problem: Health Behavior/Discharge Planning: Goal: Ability to identify and utilize available resources and services will improve 11/09/2023 1615 by Earleen Newport, RN Outcome: Progressing 11/09/2023 1612 by Earleen Newport, RN Outcome: Progressing Goal: Ability to manage health-related needs will improve 11/09/2023 1615 by Earleen Newport, RN Outcome: Progressing 11/09/2023 1612 by Earleen Newport,  RN Outcome: Progressing   Problem: Metabolic: Goal: Ability to maintain appropriate glucose levels will improve 11/09/2023 1615 by Earleen Newport, RN Outcome: Progressing 11/09/2023 1612 by Earleen Newport, RN Outcome: Progressing  Problem: Nutritional: Goal: Maintenance of adequate nutrition will improve 11/09/2023 1615 by Earleen Newport, RN Outcome: Progressing 11/09/2023 1612 by Earleen Newport, RN Outcome: Progressing Goal: Progress toward achieving an optimal weight will improve 11/09/2023 1615 by Earleen Newport, RN Outcome: Progressing 11/09/2023 1612 by Earleen Newport, RN Outcome: Progressing   Problem: Skin Integrity: Goal: Risk for impaired skin integrity will decrease 11/09/2023 1615 by Earleen Newport, RN Outcome: Progressing 11/09/2023 1612 by Earleen Newport, RN Outcome: Progressing   Problem: Tissue Perfusion: Goal: Adequacy of tissue perfusion will improve 11/09/2023 1615 by Earleen Newport, RN Outcome: Progressing 11/09/2023 1612 by Earleen Newport, RN Outcome: Progressing   Problem: Education: Goal: Knowledge of General Education information will improve Description: Including pain rating scale, medication(s)/side effects and non-pharmacologic comfort measures 11/09/2023 1615 by Earleen Newport, RN Outcome: Progressing 11/09/2023 1612 by Earleen Newport, RN Outcome: Progressing   Problem: Health Behavior/Discharge Planning: Goal: Ability to manage health-related needs will improve 11/09/2023 1615 by Earleen Newport, RN Outcome: Progressing 11/09/2023 1612 by Earleen Newport, RN Outcome: Progressing   Problem: Clinical Measurements: Goal: Ability to maintain clinical measurements within normal limits will improve 11/09/2023 1615 by Earleen Newport, RN Outcome: Progressing 11/09/2023 1612 by Earleen Newport, RN Outcome: Progressing Goal: Will remain free from infection 11/09/2023 1615 by Earleen Newport, RN Outcome: Progressing 11/09/2023 1612 by Earleen Newport, RN Outcome: Progressing Goal: Diagnostic test results will improve 11/09/2023 1615 by Earleen Newport, RN Outcome: Progressing 11/09/2023 1612 by Earleen Newport, RN Outcome: Progressing Goal: Respiratory complications will improve 11/09/2023 1615 by Earleen Newport, RN Outcome: Progressing 11/09/2023 1612 by Earleen Newport, RN Outcome: Progressing Goal: Cardiovascular complication will be avoided 11/09/2023 1615 by Earleen Newport, RN Outcome: Progressing 11/09/2023 1612 by Earleen Newport, RN Outcome: Progressing   Problem: Activity: Goal: Risk for activity intolerance will decrease 11/09/2023 1615 by Earleen Newport, RN Outcome: Progressing 11/09/2023 1612 by Earleen Newport, RN Outcome: Progressing   Problem: Nutrition: Goal: Adequate nutrition will be maintained 11/09/2023 1615 by Earleen Newport, RN Outcome: Progressing 11/09/2023 1612 by Earleen Newport, RN Outcome: Progressing   Problem: Coping: Goal: Level of anxiety will decrease 11/09/2023 1615 by Earleen Newport, RN Outcome: Progressing 11/09/2023 1612 by Earleen Newport, RN Outcome: Progressing   Problem: Elimination: Goal: Will not experience complications related to bowel motility 11/09/2023 1615 by Earleen Newport, RN Outcome: Progressing 11/09/2023 1612 by Earleen Newport, RN Outcome: Progressing Goal: Will not experience complications related to urinary retention 11/09/2023 1615 by Earleen Newport, RN Outcome: Progressing 11/09/2023 1612 by Earleen Newport, RN Outcome: Progressing   Problem: Pain Managment: Goal: General experience of comfort will improve and/or be controlled 11/09/2023 1615 by Earleen Newport, RN Outcome: Progressing 11/09/2023 1612 by Earleen Newport, RN Outcome: Progressing   Problem: Safety: Goal: Ability to remain free from injury will improve 11/09/2023 1615 by Earleen Newport, RN Outcome: Progressing 11/09/2023 1612 by Earleen Newport, RN Outcome: Progressing    Problem: Skin Integrity: Goal: Risk for impaired skin integrity will decrease 11/09/2023 1615 by Earleen Newport, RN Outcome: Progressing 11/09/2023 1612 by Earleen Newport, RN Outcome: Progressing

## 2023-11-09 NOTE — Procedures (Signed)
 Patient Name: MAMYE BOLDS  MRN: [id number]  Epilepsy Attending: Charlsie Quest  Referring Physician/Provider: Caryl Pina, MD  Date: 11/09/2023 Duration: 22.21 mins  Patient history: 88 year old female with altered mental status.  EEG to evaluate for seizure.  Level of alertness: Awake, drowsy  AEDs during EEG study: None  Technical aspects: This EEG study was done with scalp electrodes positioned according to the 10-20 International system of electrode placement. Electrical activity was reviewed with band pass filter of 1-70Hz , sensitivity of 7 uV/mm, display speed of 16mm/sec with a 60Hz  notched filter applied as appropriate. EEG data were recorded continuously and digitally stored.  Video monitoring was available and reviewed as appropriate.  Description: The posterior dominant rhythm consists of 8 Hz activity of moderate voltage (25-35 uV) seen predominantly in posterior head regions, symmetric and reactive to eye opening and eye closing. Drowsiness was characterized by attenuation of the posterior background rhythm. EEG showed intermittent generalized 3 to 6 Hz theta-delta slowing.  Photic driving was not seen during photic stimulation. Hyperventilation and was not performed.     ABNORMALITY - Intermittent slow, generalized  IMPRESSION: This study is suggestive of mild diffuse encephalopathy. No seizures or epileptiform discharges were seen throughout the recording.  Briony Parveen Annabelle Harman

## 2023-11-09 NOTE — Hospital Course (Signed)
 Patient is a 88 year old female  with medical history significant of HTN, HLD, DM, depression with anxiety, anemia, DVT not on anticoagulants, recent left wrist fracture, who presents with AMS, slurred speech, right facial droop.  Per patient son, symptoms started after taking tramadol. Seen by neurology, MRI scan did not show any stroke.  Patient is not a candidate for antiplatelet treatment due to subdural hematoma. Patient also had open reduction and internal fixation of a left distal radius fracture 4/3.   Condition has improved, currently pending nursing home placement.

## 2023-11-09 NOTE — Consult Note (Signed)
 ORTHOPAEDIC CONSULTATION  REQUESTING PHYSICIAN: [name], MD  Chief Complaint:   Left wrist pain.  History of Present Illness: Morgan Bowen is a 88 y.o. female with multiple medical problems including diabetes, hypertension, gastroesophageal reflux disease, osteoporosis, and anxiety who apparently lost her balance and fell backwards while getting out of her car on 11/06/2023, landing on her outstretched left hand.  She presented to the emergency room where x-rays demonstrated a significantly displaced left distal radius fracture.  An attempt was made to reduce the fracture under hematoma block with finger traps and counterweight with some improvement in the overall alignment.  The patient was discharged home and advised to follow-up with orthopedics.  However, she became progressively disoriented and was brought back to the emergency room the next day.  She was worked up for stroke but this workup was negative.  It was felt that she developed these mental status changes due to the tramadol.  While in, formal orthopedic consultation has been requested to discuss treatment options for her distal radius fracture.  The patient does note pain at the wrist, but denies any numbness or paresthesias to her fingers.  She is a poor historian however.  Past Medical History:  Diagnosis Date   Acute cystitis 05/23/2018   Acute deep vein thrombosis (DVT) of right tibial vein (HCC) 04/03/2020   Anxiety    Arthritis    Bronchitis 06/17/2015   Cellulitis 12/13/2018   Chickenpox    COVID-19 virus infection 06/24/2022   Diabetes mellitus without complication (HCC)    Dizziness 01/15/2022   Fall 12/10/2022   GERD (gastroesophageal reflux disease)    Hypertension    Laceration of skin and cutaneous sensory nerve of left upper extremity 04/13/2022   Osteoporosis    Type 2 diabetes mellitus (HCC)    Urinary tract infection    Past Surgical  History:  Procedure Laterality Date   ABDOMINAL HYSTERECTOMY  1984   ABDOMINAL HYSTERECTOMY     APPENDECTOMY  1958   CATARACT EXTRACTION Right 04/25/2023   CHOLECYSTECTOMY     GALLBLADDER SURGERY     MANDIBLE FRACTURE SURGERY  07/13/2016   TONSILLECTOMY  1958   TONSILLECTOMY     WRIST FRACTURE SURGERY Right    2000   Social History   Socioeconomic History   Marital status: Widowed    Spouse name: Not on file   Number of children: Not on file   Years of education: Not on file   Highest education level: 12th grade  Occupational History   Not on file  Tobacco Use   Smoking status: Never   Smokeless tobacco: Never  Vaping Use   Vaping status: Never Used  Substance and Sexual Activity   Alcohol use: No   Drug use: No   Sexual activity: Not Currently  Other Topics Concern   Not on file  Social History Narrative   ** Merged History Encounter **       Widowed. Moved from [place]. 1 son, 3 grandchildren. Retired. Once worked as a Futures trader.    Social Drivers of Corporate investment banker Strain: Low Risk  (04/27/2023)   Overall Financial Resource Strain (CARDIA)    Difficulty of Paying Living Expenses: Not hard at all  Food Insecurity: No Food Insecurity (11/08/2023)   Hunger Vital Sign    Worried About Running Out of Food in the Last Year: Never true    Ran Out of Food in the Last Year: Never true  Transportation Needs: No  Transportation Needs (11/08/2023)   PRAPARE - Administrator, Civil Service (Medical): No    Lack of Transportation (Non-Medical): No  Physical Activity: Inactive (04/27/2023)   Exercise Vital Sign    Days of Exercise per Week: 0 days    Minutes of Exercise per Session: 0 min  Stress: No Stress Concern Present (04/27/2023)   [employer] of Occupational Health - Occupational Stress Questionnaire    Feeling of Stress : Not at all  Social Connections: Socially Isolated (11/08/2023)   Social Connection and Isolation Panel [NHANES]     Frequency of Communication with Friends and Family: More than three times a week    Frequency of Social Gatherings with Friends and Family: More than three times a week    Attends Religious Services: Never    Database administrator or Organizations: No    Attends Banker Meetings: Never    Marital Status: Widowed   Family History  Problem Relation Age of Onset   Hypertension Mother    Stroke Mother    Stroke Father    Hypertension Father    Hypertension Sister    Kidney disease Sister    Breast cancer Neg Hx    Allergies  Allergen Reactions   Macrobid [Nitrofurantoin] Nausea Only    Pts son wanted noted that macrobid caused bad nausea   Sulfamethoxazole-Trimethoprim     Other reaction(s): Not available   Penicillins Rash   Prior to Admission medications   Medication Sig Start Date End Date Taking? Authorizing Provider  amLODipine (NORVASC) 10 MG tablet TAKE ONE TABLET BY MOUTH ONCE DAILY FOR BLOOD PRESSURE Patient taking differently: Take 10 mg by mouth daily. TAKE ONE TABLET BY MOUTH ONCE DAILY FOR BLOOD PRESSURE. 03/18/23  Yes Doreene Nest, NP  ascorbic acid (VITAMIN C) 500 MG tablet Take 500 mg by mouth daily.   Yes [provider]  Calcium-Magnesium-Vitamin D [phone] MG-MG-UNIT TABS Take 1 tablet by mouth daily.   Yes [provider]  co-enzyme Q-10 30 MG capsule Take 30 mg by mouth 3 (three) times daily.   Yes [provider]  glimepiride (AMARYL) 4 MG tablet Take 1 tablet (4 mg total) by mouth 2 (two) times daily. for diabetes. 10/04/23  Yes Doreene Nest, NP  irbesartan (AVAPRO) 300 MG tablet TAKE ONE TABLET BY MOUTH ONCE DAILY FOR BLOOD PRESSURE Patient taking differently: Take 300 mg by mouth daily. 03/18/23  Yes Doreene Nest, NP  metFORMIN (GLUCOPHAGE) 1000 MG tablet TAKE 1 TABLET BY MOUTH TWICE DAILY WITH A MEAL FOR DIABETES Patient taking differently: Take 1,000 mg by mouth 2 (two) times daily with a meal.  11/02/23  Yes Doreene Nest, NP  metoprolol succinate (TOPROL-XL) 25 MG 24 hr tablet TAKE ONE TABLET BY MOUTH TWICE DAILY FOR blood pressure. 05/31/23  Yes Antonieta Iba, MD  Omega-3 Fatty Acids (FISH OIL) 300 MG CAPS Take by mouth.   Yes [provider]  traMADol (ULTRAM) 50 MG tablet Take 1 tablet (50 mg total) by mouth every 8 (eight) hours as needed. 11/06/23  Yes Triplett, Cari B, FNP  Vitamin D, Ergocalciferol, (DRISDOL) 1.25 MG (50000 UNIT) CAPS capsule TAKE 1 CAPSULE BY MOUTH ONCE WEEKLY 11/02/23  Yes Doreene Nest, NP  glucose blood (ONE TOUCH ULTRA TEST) test strip USE AS DIRECTED TEST 3 TIMES DAILY E11.9 01/18/19   Doreene Nest, NP   MR BRAIN WO CONTRAST Result Date: 11/08/2023 CLINICAL DATA:  Altered  mental status and facial droop EXAM: MRI HEAD WITHOUT CONTRAST TECHNIQUE: Multiplanar, multiecho pulse sequences of the brain and surrounding structures were obtained without intravenous contrast. COMPARISON:  None Available. FINDINGS: Brain: No acute infarct. Bilateral chronic subdural hematomas, measuring up to 4 mm. There is multifocal hyperintense T2-weighted signal within the white matter. Parenchymal volume and CSF spaces are normal. The midline structures are normal. Old left cerebellar and left basal ganglia small vessel infarcts. Vascular: Normal flow voids. Skull and upper cervical spine: Normal calvarium and skull base. Visualized upper cervical spine and soft tissues are normal. Sinuses/Orbits:No paranasal sinus fluid levels or advanced mucosal thickening. No mastoid or middle ear effusion. Normal orbits. IMPRESSION: 1. No acute intracranial abnormality. 2. Bilateral chronic subdural hematomas, measuring up to 4 mm. 3. Old left cerebellar and left basal ganglia small vessel infarcts. Electronically Signed   By: Deatra Robinson M.D.   On: 11/08/2023 02:57   CT ABDOMEN PELVIS WO CONTRAST Result Date: 11/08/2023 CLINICAL DATA:  Bowel obstruction EXAM: CT ABDOMEN AND  PELVIS WITHOUT CONTRAST TECHNIQUE: Multidetector CT imaging of the abdomen and pelvis was performed following the standard protocol without IV contrast. RADIATION DOSE REDUCTION: This exam was performed according to the departmental dose-optimization program which includes automated exposure control, adjustment of the mA and/or kV according to patient size and/or use of iterative reconstruction technique. COMPARISON:  None Available. FINDINGS: Lower chest: Moderate right coronary artery calcification. No acute abnormality. Hepatobiliary: No focal liver abnormality is seen. Status post cholecystectomy. No biliary dilatation. Pancreas: Atrophic but otherwise unremarkable Spleen: Unremarkable Adrenals/Urinary Tract: The adrenal glands are unremarkable. The kidneys are normal in size and position. Excreted contrast within the collecting system. No hydronephrosis. No definite intraluminal defect identified. Mild bilateral nonspecific perinephric inflammatory stranding. No perinephric fluid collections are seen. Bladder is mildly distended but otherwise unremarkable. Stomach/Bowel: Moderate to severe pancolonic diverticulosis without superimposed focal inflammatory change. Stomach, small bowel, and large bowel are otherwise unremarkable. Appendix absent. No free intraperitoneal gas or fluid. Vascular/Lymphatic: Aortic atherosclerosis. No enlarged abdominal or pelvic lymph nodes. Reproductive: Status post hysterectomy. No adnexal masses. Other: No abdominal wall hernia or abnormality. No abdominopelvic ascites. Musculoskeletal: Remote L1 superior endplate fracture with approximately 50% loss of height. Moderate thoracic dextroscoliosis, apex right at L2. No acute bone abnormality. No lytic or blastic bone lesion. IMPRESSION: 1. No acute intra-abdominal pathology identified. No definite radiographic explanation for the patient's reported symptoms. 2. Moderate right coronary artery calcification. 3. Moderate to severe  pancolonic diverticulosis without superimposed focal inflammatory change. 4. Remote L1 superior endplate fracture with approximately 50% loss of height. Aortic Atherosclerosis (ICD10-I70.0). Electronically Signed   By: Helyn Numbers M.D.   On: 11/08/2023 00:31   CT ANGIO HEAD NECK W WO CM W PERF (CODE STROKE) Result Date: 11/07/2023 CLINICAL DATA:  Right-sided weakness and altered mental status EXAM: CT ANGIOGRAPHY HEAD AND NECK CT PERFUSION BRAIN TECHNIQUE: Multidetector CT imaging of the head and neck was performed using the standard protocol during bolus administration of intravenous contrast. Multiplanar CT image reconstructions and MIPs were obtained to evaluate the vascular anatomy. Carotid stenosis measurements (when applicable) are obtained utilizing NASCET criteria, using the distal internal carotid diameter as the denominator. Multiphase CT imaging of the brain was performed following IV bolus contrast injection. Subsequent parametric perfusion maps were calculated using RAPID software. RADIATION DOSE REDUCTION: This exam was performed according to the departmental dose-optimization program which includes automated exposure control, adjustment of the mA and/or kV according to patient size and/or use  of iterative reconstruction technique. CONTRAST:  OMNIPAQUE IOHEXOL 350 MG/ML SOLN COMPARISON:  None Available. FINDINGS: CTA NECK FINDINGS Skeleton: No acute abnormality or high grade bony spinal canal stenosis. Other neck: Normal pharynx, larynx and major salivary glands. No cervical lymphadenopathy. Unremarkable thyroid gland. Upper chest: No pneumothorax or pleural effusion. No nodules or masses. Aortic arch: There is calcific atherosclerosis of the aortic arch. Conventional 3 vessel aortic branching pattern. RIGHT carotid system: No dissection, occlusion or aneurysm. Mild atherosclerotic calcification at the carotid bifurcation without hemodynamically significant stenosis. LEFT carotid system: No  dissection, occlusion or aneurysm. Mild atherosclerotic calcification at the carotid bifurcation without hemodynamically significant stenosis. Vertebral arteries: Codominant configuration. Mild stenosis of the right vertebral artery origin due to atherosclerotic calcification. The vertebral arteries are otherwise normal to the skull base. CTA HEAD FINDINGS POSTERIOR CIRCULATION: Bilateral atherosclerotic calcification without flow limiting stenosis. No proximal occlusion of the anterior or inferior cerebellar arteries. Basilar artery is normal. Superior cerebellar arteries are normal. Posterior cerebral arteries are normal. ANTERIOR CIRCULATION: Atherosclerotic calcification of the internal carotid arteries at the skull base without hemodynamically significant stenosis. Anterior cerebral arteries are normal. Middle cerebral arteries are normal. Venous sinuses: As permitted by contrast timing, patent. Anatomic variants: None Review of the MIP images confirms the above findings. CT Brain Perfusion Findings: ASPECTS: 10 CBF (<30%) Volume: 0mL Perfusion (Tmax>6.0s) volume: 3mL Mismatch Volume: 3mL Infarction Location:The 3 mL area of ischemia is likely artifactual given suboptimal time-enhancement curve. IMPRESSION: 1. No emergent large vessel occlusion. 2. Mild stenosis of the right vertebral artery origin due to atherosclerotic calcification. 3. Mild atherosclerotic calcification at the carotid bifurcations and skull base without hemodynamically significant stenosis. 4. A 3 mL area of ischemia on CT perfusion is likely artifactual given suboptimal time-enhancement curve. Electronically Signed   By: Deatra Robinson M.D.   On: 11/07/2023 21:27   CT HEAD CODE STROKE WO CONTRAST Result Date: 11/07/2023 CLINICAL DATA:  Code stroke.  Acute neurologic deficit EXAM: CT HEAD WITHOUT CONTRAST TECHNIQUE: Contiguous axial images were obtained from the base of the skull through the vertex without intravenous contrast. RADIATION  DOSE REDUCTION: This exam was performed according to the departmental dose-optimization program which includes automated exposure control, adjustment of the mA and/or kV according to patient size and/or use of iterative reconstruction technique. COMPARISON:  11/06/2023 FINDINGS: Brain: Unchanged appearance of bilateral low-density extra-axial collections, most consistent with chronic subdural hematomas. The size and configuration of the ventricles and extra-axial CSF spaces are normal. There is hypoattenuation of the periventricular white matter, most commonly indicating chronic ischemic microangiopathy. Old right parietal infarct. Vascular: No abnormal hyperdensity of the major intracranial arteries or dural venous sinuses. No intracranial atherosclerosis. Skull: The visualized skull base, calvarium and extracranial soft tissues are normal. Sinuses/Orbits: No fluid levels or advanced mucosal thickening of the visualized paranasal sinuses. No mastoid or middle ear effusion. The orbits are normal. ASPECTS [facility] Stroke Program Early CT Score) - Ganglionic level infarction (caudate, lentiform nuclei, internal capsule, insula, M1-M3 cortex): 7 - Supraganglionic infarction (M4-M6 cortex): 3 Total score (0-10 with 10 being normal): 10 IMPRESSION: 1. No acute intracranial abnormality. 2. ASPECTS is 10. 3. Unchanged appearance of bilateral low-density extra-axial collections, most consistent with chronic subdural hematomas. These results were called by telephone at the time of interpretation on 11/07/2023 at 8:52 pm to provider DAVID [facility] , who verbally acknowledged these results. Electronically Signed   By: Deatra Robinson M.D.   On: 11/07/2023 20:55    Positive ROS: All  other systems have been reviewed and were otherwise negative with the exception of those mentioned in the HPI and as above.  Physical Exam: General:  Alert, no acute distress Psychiatric:  Patient is not competent for consent, but exhibits normal  mood and affect   Cardiovascular:  No pedal edema Respiratory:  No wheezing, non-labored breathing GI:  Abdomen is soft and non-tender Skin:  No lesions in the area of chief complaint Neurologic:  Sensation intact distally Lymphatic:  No axillary or cervical lymphadenopathy  Orthopedic Exam:  Orthopedic examination is limited to the left hand and upper extremity.  The patient is in a sugar-tong splint maintaining the wrist in neutral position.  The splint appears to be in good condition.  Skin is intact at the proximal and distal margins of the splint.  She is able to actively flex and extend all digits without any pain or catching.  She denies any numbness or paresthesias to any of her fingers.  She has good capillary refill to all digits.  X-rays:  Recent x-rays of the left wrist are available for review and have been reviewed by myself.  These films demonstrate a transverse left distal radius fracture with intra-articular extension, as well as an ulnar styloid fracture.  Significant osteopenia is noted throughout the visualized bony structures.  No significant degenerative changes of the radiocarpal joint are noted.  Assessment: Closed displaced left distal radius fracture with an ulnar styloid fracture.  Plan: The treatment options have been discussed with the patient and, by phone, with her son, who is her healthcare proxy.  The son would like to proceed with surgical intervention on behalf of the patient in order to optimize her healing/recovery process and hopefully to optimize her overall functional capabilities once the fracture has healed.  The procedure of an open reduction and internal fixation of the left distal radius fracture was discussed in detail, as were the potential risks (including bleeding, infection, nerve and blood vessel injury, persistent or recurrent pain, malunion and/or nonunion, hardware failure, weakness of grip, stiffness of the wrist, need for further surgery,  blood clots, strokes, heart attacks and/or arrhythmias, etc.) and benefits.  The patient's son states his understanding on behalf of the patient and would like to proceed.  A formal written consent will be obtained by the nursing staff.  The timing of the surgery will be predicated on how quickly the patient can be optimized from medical standpoint.  Thank you for asking me to participate in the care of this most pleasant and unfortunate woman.  I will be happy to follow her with you.   Maryagnes Amos, MD  Beeper #:  [phone]  11/09/2023 2:40 PM

## 2023-11-09 NOTE — Progress Notes (Signed)
 Latest Reference Range & Units 11/09/23 07:21  Glucose-Capillary 70 - 99 mg/dL 82  Notifed MD pt did not eat breakfast, son states pt will only eat 1/2 sandwich for lunch. MD dc semglee

## 2023-11-09 NOTE — Progress Notes (Signed)
 Progress Note   Patient: Morgan Bowen RUE:[id number] DOB: 07-Jun-1936 DOA: 11/07/2023     1 DOS: the patient was seen and examined on 11/09/2023   Brief hospital course: Patient is a 88 year old female  with medical history significant of HTN, HLD, DM, depression with anxiety, anemia, DVT not on anticoagulants, recent left wrist fracture, who presents with AMS, slurred speech, right facial droop.  Per patient son, symptoms started after taking tramadol. Seen by neurology, MRI scan did not show any stroke.  Patient is not a candidate for antiplatelet treatment due to subdural hematoma.   Principal Problem:   Acute metabolic encephalopathy Active Problems:   SDH (subdural hematoma) (HCC)   Type 2 diabetes mellitus with other specified complication (HCC)   HLD (hyperlipidemia)   Essential hypertension   Microcytic anemia   Nausea vomiting and diarrhea   Anxiety and depression   Left wrist fracture   Assessment and Plan: Acute metabolic encephalopathy with facial droop. Bilateral subdural hematoma. Old left basal ganglia and cerebellar stroke. Stroke is ruled out with MRI.  Patient has been seen by neurology, not a candidate for antiplatelet treatment due to bilateral subdural hematoma.  Patient could have had bad reaction to tramadol. Patient condition has improved today, will avoid tramadol at this time.  Acute on chronic anemia. B12 deficient anemia. Patient hemoglobin has dropped down to 7.1, patient never had any black stool or rectal bleeding.  At this point, patient will be treated with B12 injection x 7 days followed with 2000 mg daily.  Monitor CBC.  Patient and son is agreeable for transfusion if hemoglobin less than 7.0.  Chronic diarrhea. Hyponatremia. Hypokalemia. Given malabsorption of B12, patient most likely has pancreatic insufficiency causing absorption and diarrhea.  Will start Creon. Replete potassium.  Patient also has a tendency to drink large amount of water,  start fluid restriction for hyponatremia.  Also start sodium chloride 1 g twice a day.  Uncontrolled type 2 diabetes with hyperglycemia. Glucose running high at time of admission, glucose dropped down to 82, will discontinue long-acting insulin continue sliding scale insulin.  Left wrist fracture status post reduction, in cast.   Follow-up with orthopedics as outpatient.    Subjective:  Still complaining some left wrist pain, no shortness of breath.  No more diarrhea today.  Physical Exam: Vitals:   11/09/23 0014 11/09/23 0402 11/09/23 0719 11/09/23 1156  BP: (!) 111/51 (!) 121/51 (!) 142/50 (!) 158/54  Pulse: 80 77 81 86  Resp: 15 16 17 18   Temp: 97.9 F (36.6 C) 97.7 F (36.5 C) 97.8 F (36.6 C) (!) 97.1 F (36.2 C)  TempSrc: Oral Oral Oral   SpO2: 96% 96% 96% 96%  Weight:      Height:       General exam: Appears calm and comfortable  Respiratory system: Clear to auscultation. Respiratory effort normal. Cardiovascular system: S1 & S2 heard, RRR. No JVD, murmurs, rubs, gallops or clicks. No pedal edema. Gastrointestinal system: Abdomen is nondistended, soft and nontender. No organomegaly or masses felt. Normal bowel sounds heard. Central nervous system: Alert and oriented x2.  No focal neurological deficits. Extremities: Symmetric 5 x 5 power. Skin: No rashes, lesions or ulcers Psychiatry:  Mood & affect appropriate.    Data Reviewed:  Review MRI results, CT results and lab results.  Family Communication: Son updated at the bedside.  Disposition: Status is: Inpatient Remains inpatient appropriate because: Severity of disease.  IV treatment.     Time spent: 55 minutes  Author: [name], MD 11/09/2023 2:02 PM  For on call review [email or web address].

## 2023-11-09 NOTE — Progress Notes (Signed)
 EEG complete - results pending

## 2023-11-10 ENCOUNTER — Encounter: Payer: Self-pay | Admitting: Internal Medicine

## 2023-11-10 ENCOUNTER — Inpatient Hospital Stay: Admitting: Certified Registered"

## 2023-11-10 ENCOUNTER — Other Ambulatory Visit: Payer: Self-pay

## 2023-11-10 ENCOUNTER — Inpatient Hospital Stay

## 2023-11-10 ENCOUNTER — Encounter
Admission: EM | Disposition: A | Discharge status: Skilled Nursing Facility | Payer: Self-pay | Source: Home / Self Care | Attending: Internal Medicine

## 2023-11-10 DIAGNOSIS — G8918 Other acute postprocedural pain: Secondary | ICD-10-CM | POA: Diagnosis not present

## 2023-11-10 DIAGNOSIS — D513 Other dietary vitamin B12 deficiency anemia: Secondary | ICD-10-CM

## 2023-11-10 DIAGNOSIS — I10 Essential (primary) hypertension: Secondary | ICD-10-CM | POA: Diagnosis not present

## 2023-11-10 DIAGNOSIS — S52502A Unspecified fracture of the lower end of left radius, initial encounter for closed fracture: Secondary | ICD-10-CM | POA: Diagnosis not present

## 2023-11-10 DIAGNOSIS — S065XAA Traumatic subdural hemorrhage with loss of consciousness status unknown, initial encounter: Secondary | ICD-10-CM | POA: Diagnosis not present

## 2023-11-10 DIAGNOSIS — G9341 Metabolic encephalopathy: Secondary | ICD-10-CM | POA: Diagnosis not present

## 2023-11-10 DIAGNOSIS — S52532A Colles' fracture of left radius, initial encounter for closed fracture: Secondary | ICD-10-CM | POA: Diagnosis not present

## 2023-11-10 HISTORY — PX: ORIF RADIAL FRACTURE: SHX5113

## 2023-11-10 LAB — GLUCOSE, CAPILLARY
Glucose-Capillary: 133 mg/dL — ABNORMAL HIGH (ref 70–99)
Glucose-Capillary: 93 mg/dL (ref 70–99)
Glucose-Capillary: 119 mg/dL — ABNORMAL HIGH (ref 70–99)
Glucose-Capillary: 174 mg/dL — ABNORMAL HIGH (ref 70–99)
Glucose-Capillary: 336 mg/dL — ABNORMAL HIGH (ref 70–99)
Glucose-Capillary: 250 mg/dL — ABNORMAL HIGH (ref 70–99)

## 2023-11-10 LAB — MAGNESIUM: Magnesium: 2 mg/dL (ref 1.7–2.4)

## 2023-11-10 LAB — CBC
HCT: 23.2 % — ABNORMAL LOW (ref 36.0–46.0)
Hemoglobin: 7.3 g/dL — ABNORMAL LOW (ref 12.0–15.0)
MCH: 24.8 pg — ABNORMAL LOW (ref 26.0–34.0)
MCHC: 31.5 g/dL (ref 30.0–36.0)
MCV: 78.9 fL — ABNORMAL LOW (ref 80.0–100.0)
Platelets: 307 10*3/uL (ref 150–400)
RBC: 2.94 MIL/uL — ABNORMAL LOW (ref 3.87–5.11)
RDW: 15.3 % (ref 11.5–15.5)
WBC: 6.9 10*3/uL (ref 4.0–10.5)
nRBC: 0.3 % — ABNORMAL HIGH (ref 0.0–0.2)

## 2023-11-10 LAB — BASIC METABOLIC PANEL WITH GFR
Anion gap: 6 (ref 5–15)
BUN: 20 mg/dL (ref 8–23)
CO2: 24 mmol/L (ref 22–32)
Calcium: 8.5 mg/dL — ABNORMAL LOW (ref 8.9–10.3)
Chloride: 104 mmol/L (ref 98–111)
Creatinine, Ser: 0.93 mg/dL (ref 0.44–1.00)
GFR, Estimated: >60 mL/min (ref >60)
Glucose, Bld: 174 mg/dL — ABNORMAL HIGH (ref 70–99)
Potassium: 4.5 mmol/L (ref 3.5–5.1)
Sodium: 134 mmol/L — ABNORMAL LOW (ref 135–145)

## 2023-11-10 LAB — ABO/RH: ABO/RH(D): O POS

## 2023-11-10 LAB — PHOSPHORUS: Phosphorus: 2.7 mg/dL (ref 2.5–4.6)

## 2023-11-10 LAB — PREPARE RBC (CROSSMATCH)

## 2023-11-10 SURGERY — OPEN REDUCTION INTERNAL FIXATION (ORIF) RADIAL FRACTURE
Anesthesia: Monitor Anesthesia Care | Laterality: Left

## 2023-11-10 MED ORDER — NITROGLYCERIN 2 % TD OINT
1.0000 [in_us] | TOPICAL_OINTMENT | Freq: Three times a day (TID) | TRANSDERMAL | Status: AC
  Administered 2023-11-10 – 2023-11-12 (×6): 1 [in_us] via TOPICAL
  Filled 2023-11-10 (×6): qty 1

## 2023-11-10 MED ORDER — ENOXAPARIN SODIUM 40 MG/0.4ML IJ SOSY
40.0000 mg | PREFILLED_SYRINGE | INTRAMUSCULAR | Status: DC
  Administered 2023-11-11 – 2023-11-16 (×6): 40 mg via SUBCUTANEOUS
  Filled 2023-11-10 (×6): qty 0.4

## 2023-11-10 MED ORDER — ONDANSETRON HCL 4 MG/2ML IJ SOLN
INTRAMUSCULAR | Status: DC | PRN
  Administered 2023-11-10: 4 mg via INTRAVENOUS

## 2023-11-10 MED ORDER — ACETAMINOPHEN 500 MG PO TABS: 500.0000 mg | ORAL_TABLET | Freq: Four times a day (QID) | ORAL | Status: DC

## 2023-11-10 MED ORDER — CEFAZOLIN SODIUM-DEXTROSE 2-4 GM/100ML-% IV SOLN
INTRAVENOUS | Status: AC
  Filled 2023-11-10: qty 100

## 2023-11-10 MED ORDER — PROPOFOL 10 MG/ML IV BOLUS
INTRAVENOUS | Status: DC | PRN
  Administered 2023-11-10: 50 mg via INTRAVENOUS

## 2023-11-10 MED ORDER — METOCLOPRAMIDE HCL 5 MG/ML IJ SOLN
5.0000 mg | Freq: Three times a day (TID) | INTRAMUSCULAR | Status: DC | PRN
  Administered 2023-11-11 – 2023-11-14 (×2): 10 mg via INTRAVENOUS
  Filled 2023-11-10 (×2): qty 2

## 2023-11-10 MED ORDER — 0.9 % SODIUM CHLORIDE (POUR BTL) OPTIME
TOPICAL | Status: DC | PRN
  Administered 2023-11-10: 500 mL

## 2023-11-10 MED ORDER — CEFAZOLIN SODIUM-DEXTROSE 2-4 GM/100ML-% IV SOLN: 2.0000 g | Freq: Four times a day (QID) | INTRAVENOUS | Status: DC

## 2023-11-10 MED ORDER — STERILE WATER FOR INJECTION IJ SOLN
INTRAMUSCULAR | Status: AC
  Filled 2023-11-10: qty 10

## 2023-11-10 MED ORDER — FENTANYL CITRATE (PF) 100 MCG/2ML IJ SOLN
INTRAMUSCULAR | Status: DC | PRN
  Administered 2023-11-10 (×4): 25 ug via INTRAVENOUS

## 2023-11-10 MED ORDER — ONDANSETRON HCL 4 MG PO TABS: 4.0000 mg | ORAL_TABLET | Freq: Four times a day (QID) | ORAL | Status: DC | PRN

## 2023-11-10 MED ORDER — SODIUM CHLORIDE 0.9 % IV SOLN: INTRAVENOUS | Status: DC

## 2023-11-10 MED ORDER — BUPIVACAINE HCL (PF) 0.5 % IJ SOLN
INTRAMUSCULAR | Status: DC | PRN
  Administered 2023-11-10: 20 mL via PERINEURAL

## 2023-11-10 MED ORDER — BUPIVACAINE HCL (PF) 0.5 % IJ SOLN
INTRAMUSCULAR | Status: AC
  Filled 2023-11-10: qty 30

## 2023-11-10 MED ORDER — DEXMEDETOMIDINE HCL IN NACL 80 MCG/20ML IV SOLN
INTRAVENOUS | Status: DC | PRN
  Administered 2023-11-10 (×4): 4 ug via INTRAVENOUS

## 2023-11-10 MED ORDER — PROPOFOL 10 MG/ML IV BOLUS
INTRAVENOUS | Status: AC
  Filled 2023-11-10: qty 20

## 2023-11-10 MED ORDER — ACETAMINOPHEN 325 MG PO TABS
ORAL_TABLET | ORAL | Status: AC
Start: 2023-11-10 — End: ?
  Filled 2023-11-10: qty 2

## 2023-11-10 MED ORDER — BISACODYL 10 MG RE SUPP: 10.0000 mg | Freq: Every day | RECTAL | Status: DC | PRN

## 2023-11-10 MED ORDER — ACETAMINOPHEN 500 MG PO TABS
500.0000 mg | ORAL_TABLET | Freq: Four times a day (QID) | ORAL | Status: AC
  Administered 2023-11-10 – 2023-11-11 (×3): 500 mg via ORAL
  Filled 2023-11-10 (×3): qty 1

## 2023-11-10 MED ORDER — DEXMEDETOMIDINE HCL IN NACL 200 MCG/50ML IV SOLN
INTRAVENOUS | Status: DC | PRN
  Administered 2023-11-10: .2 ug/kg/h via INTRAVENOUS

## 2023-11-10 MED ORDER — EPHEDRINE SULFATE-NACL 50-0.9 MG/10ML-% IV SOSY
PREFILLED_SYRINGE | INTRAVENOUS | Status: DC | PRN
  Administered 2023-11-10: 5 mg via INTRAVENOUS

## 2023-11-10 MED ORDER — PHENYLEPHRINE HCL-NACL 20-0.9 MG/250ML-% IV SOLN
INTRAVENOUS | Status: AC
  Filled 2023-11-10: qty 250

## 2023-11-10 MED ORDER — DOCUSATE SODIUM 100 MG PO CAPS
100.0000 mg | ORAL_CAPSULE | Freq: Two times a day (BID) | ORAL | Status: DC
  Administered 2023-11-10 – 2023-11-15 (×10): 100 mg via ORAL
  Filled 2023-11-10 (×10): qty 1

## 2023-11-10 MED ORDER — ONDANSETRON HCL 4 MG/2ML IJ SOLN
4.0000 mg | Freq: Four times a day (QID) | INTRAMUSCULAR | Status: DC | PRN
  Administered 2023-11-11 – 2023-11-16 (×4): 4 mg via INTRAVENOUS
  Filled 2023-11-10 (×4): qty 2

## 2023-11-10 MED ORDER — FLEET ENEMA RE ENEM: 1.0000 | ENEMA | Freq: Once | RECTAL | Status: DC | PRN

## 2023-11-10 MED ORDER — MAGNESIUM HYDROXIDE 400 MG/5ML PO SUSP: 30.0000 mL | Freq: Every day | ORAL | Status: DC | PRN

## 2023-11-10 MED ORDER — BUPIVACAINE HCL (PF) 0.5 % IJ SOLN
INTRAMUSCULAR | Status: DC | PRN
  Administered 2023-11-10: 15 mL

## 2023-11-10 MED ORDER — CEFAZOLIN SODIUM-DEXTROSE 2-4 GM/100ML-% IV SOLN
2.0000 g | Freq: Four times a day (QID) | INTRAVENOUS | Status: AC
  Administered 2023-11-10 – 2023-11-11 (×2): 2 g via INTRAVENOUS
  Filled 2023-11-10 (×2): qty 100

## 2023-11-10 MED ORDER — LIDOCAINE HCL (PF) 1 % IJ SOLN
INTRAMUSCULAR | Status: AC
  Filled 2023-11-10: qty 2

## 2023-11-10 MED ORDER — PHENTOLAMINE MESYLATE 5 MG IJ SOLR
5.0000 mg | Freq: Once | INTRAMUSCULAR | Status: AC
  Administered 2023-11-10: 5 mg via SUBCUTANEOUS
  Filled 2023-11-10: qty 5

## 2023-11-10 MED ORDER — DIPHENHYDRAMINE HCL 12.5 MG/5ML PO ELIX: 12.5000 mg | ORAL_SOLUTION | ORAL | Status: DC | PRN

## 2023-11-10 MED ORDER — ACETAMINOPHEN 325 MG PO TABS
325.0000 mg | ORAL_TABLET | Freq: Four times a day (QID) | ORAL | Status: DC | PRN
  Administered 2023-11-11 – 2023-11-13 (×2): 650 mg via ORAL
  Filled 2023-11-10 (×2): qty 2

## 2023-11-10 MED ORDER — FENTANYL CITRATE (PF) 100 MCG/2ML IJ SOLN
INTRAMUSCULAR | Status: AC
  Filled 2023-11-10: qty 2

## 2023-11-10 MED ORDER — BUPIVACAINE HCL (PF) 0.5 % IJ SOLN
INTRAMUSCULAR | Status: AC
  Filled 2023-11-10: qty 20

## 2023-11-10 MED ORDER — ONDANSETRON HCL 4 MG/2ML IJ SOLN
INTRAMUSCULAR | Status: AC
  Filled 2023-11-10: qty 2

## 2023-11-10 MED ORDER — SODIUM CHLORIDE 0.9% IV SOLUTION: Freq: Once | INTRAVENOUS | Status: DC

## 2023-11-10 MED ORDER — PHENYLEPHRINE HCL-NACL 20-0.9 MG/250ML-% IV SOLN
INTRAVENOUS | Status: DC | PRN
  Administered 2023-11-10: 40 ug/min via INTRAVENOUS

## 2023-11-10 MED ORDER — LIDOCAINE HCL (PF) 2 % IJ SOLN
INTRAMUSCULAR | Status: AC
  Filled 2023-11-10: qty 5

## 2023-11-10 MED ORDER — METOCLOPRAMIDE HCL 5 MG PO TABS: 5.0000 mg | ORAL_TABLET | Freq: Three times a day (TID) | ORAL | Status: DC | PRN

## 2023-11-10 MED ORDER — LIDOCAINE HCL (PF) 1 % IJ SOLN
INTRAMUSCULAR | Status: DC | PRN
  Administered 2023-11-10: 1 mL via SUBCUTANEOUS

## 2023-11-10 MED ORDER — PHENYLEPHRINE 80 MCG/ML (10ML) SYRINGE FOR IV PUSH (FOR BLOOD PRESSURE SUPPORT)
PREFILLED_SYRINGE | INTRAVENOUS | Status: DC | PRN
Start: 2023-11-10 — End: 2023-11-10
  Administered 2023-11-10: 80 ug via INTRAVENOUS

## 2023-11-10 MED ORDER — SODIUM CHLORIDE 0.9 % IV SOLN: INTRAVENOUS | Status: DC | PRN

## 2023-11-10 SURGICAL SUPPLY — 52 items
BENZOIN TINCTURE PRP APPL 2/3 (GAUZE/BANDAGES/DRESSINGS) IMPLANT
BIT DRILL 2.2 SS TIBIAL (BIT) IMPLANT
BNDG COHESIVE 4X5 TAN STRL LF (GAUZE/BANDAGES/DRESSINGS) ×1 IMPLANT
BNDG ELASTIC 4INX 5YD STR LF (GAUZE/BANDAGES/DRESSINGS) ×1 IMPLANT
BNDG ESMARCH 4X12 STRL LF (GAUZE/BANDAGES/DRESSINGS) ×1 IMPLANT
CHLORAPREP W/TINT 26 (MISCELLANEOUS) ×2 IMPLANT
CORD BIP STRL DISP 12FT (MISCELLANEOUS) ×1 IMPLANT
CUFF TOURN SGL QUICK 18X4 (TOURNIQUET CUFF) IMPLANT
DRAPE FLUOR MINI C-ARM 54X84 (DRAPES) ×1 IMPLANT
DRAPE SURG 17X11 SM STRL (DRAPES) ×1 IMPLANT
DRAPE SURG ORHT 6 SPLT 77X108 (DRAPES) ×1 IMPLANT
DRAPE U-SHAPE 47X51 STRL (DRAPES) ×1 IMPLANT
ELECT CAUTERY BLADE 6.4 (BLADE) ×1 IMPLANT
ELECT REM PT RETURN 9FT ADLT (ELECTROSURGICAL) ×1 IMPLANT
ELECTRODE REM PT RTRN 9FT ADLT (ELECTROSURGICAL) ×1 IMPLANT
FORCEPS JEWEL BIP 4-3/4 STR (INSTRUMENTS) ×1 IMPLANT
GAUZE SPONGE 4X4 12PLY STRL (GAUZE/BANDAGES/DRESSINGS) ×1 IMPLANT
GAUZE XEROFORM 1X8 LF (GAUZE/BANDAGES/DRESSINGS) ×1 IMPLANT
GLOVE BIO SURGEON STRL SZ8 (GLOVE) ×1 IMPLANT
GLOVE INDICATOR 8.0 STRL GRN (GLOVE) ×1 IMPLANT
GOWN STRL REUS W/ TWL LRG LVL3 (GOWN DISPOSABLE) ×1 IMPLANT
GOWN STRL REUS W/ TWL XL LVL3 (GOWN DISPOSABLE) ×1 IMPLANT
K-WIRE FX5X1.6XNS BN SS (WIRE) ×2 IMPLANT
KIT TURNOVER KIT A (KITS) ×1 IMPLANT
KWIRE FX5X1.6XNS BN SS (WIRE) IMPLANT
MANIFOLD NEPTUNE II (INSTRUMENTS) ×1 IMPLANT
NDL FILTER BLUNT 18X1 1/2 (NEEDLE) ×1 IMPLANT
NEEDLE FILTER BLUNT 18X1 1/2 (NEEDLE) ×1 IMPLANT
NS IRRIG 500ML POUR BTL (IV SOLUTION) ×1 IMPLANT
PACK EXTREMITY ARMC (MISCELLANEOUS) ×1 IMPLANT
PADDING CAST BLEND 3X4 STRL (MISCELLANEOUS) IMPLANT
PADDING CAST BLEND 4X4 STRL (MISCELLANEOUS) ×2 IMPLANT
PEG LOCKING SMOOTH 2.2X14 (Peg) IMPLANT
PEG LOCKING SMOOTH 2.2X16 (Screw) IMPLANT
PEG LOCKING SMOOTH 2.2X18 (Peg) IMPLANT
PEG LOCKING SMOOTH 2.2X20 (Screw) IMPLANT
PIN DRILL HDLS TROCAR 75 4PK (PIN) IMPLANT
PLATE NARROW DVR LEFT (Plate) IMPLANT
SCREW BN 14X2.7XNONLOCK 3 LD (Screw) IMPLANT
SCREW LOCK 14X2.7X 3 LD TPR (Screw) IMPLANT
SCREW LP NL 2.7X16MM (Screw) IMPLANT
SPLINT CAST 1 STEP 3X12 (MISCELLANEOUS) IMPLANT
SPLINT CAST 1 STEP 4X15 (MISCELLANEOUS) IMPLANT
STAPLER SKIN PROX 35W (STAPLE) ×1 IMPLANT
STOCKINETTE IMPERVIOUS 9X36 MD (GAUZE/BANDAGES/DRESSINGS) ×1 IMPLANT
STRIP CLOSURE SKIN 1/4X4 (GAUZE/BANDAGES/DRESSINGS) IMPLANT
SUT PROLENE 4 0 PS 2 18 (SUTURE) ×1 IMPLANT
SUT VIC AB 2-0 SH 27XBRD (SUTURE) ×1 IMPLANT
SUT VIC AB 3-0 SH 27X BRD (SUTURE) ×1 IMPLANT
SYR 10ML LL (SYRINGE) ×1 IMPLANT
TRAP FLUID SMOKE EVACUATOR (MISCELLANEOUS) ×1 IMPLANT
WATER STERILE IRR 500ML POUR (IV SOLUTION) ×1 IMPLANT

## 2023-11-10 NOTE — Plan of Care (Signed)

## 2023-11-10 NOTE — Anesthesia Procedure Notes (Signed)
 Procedure Name: LMA Insertion Date/Time: 11/10/2023 11:30 AM  Performed by: Monico Hoar, CRNAPre-anesthesia Checklist: Patient identified, Patient being monitored, Timeout performed, Emergency Drugs available and Suction available Patient Re-evaluated:Patient Re-evaluated prior to induction Oxygen Delivery Method: Circle system utilized Preoxygenation: Pre-oxygenation with 100% oxygen Induction Type: IV induction Ventilation: Mask ventilation without difficulty LMA: LMA inserted LMA Size: 4.0 Tube type: Oral Number of attempts: 1 Placement Confirmation: positive ETCO2 and breath sounds checked- equal and bilateral Tube secured with: Tape Dental Injury: Teeth and Oropharynx as per pre-operative assessment

## 2023-11-10 NOTE — Anesthesia Postprocedure Evaluation (Signed)
 Anesthesia Post Note  Patient: Morgan Bowen  Procedure(s) Performed: OPEN REDUCTION INTERNAL FIXATION (ORIF) RADIAL FRACTURE (Left)  Patient location during evaluation: PACU Anesthesia Type: General Level of consciousness: awake and alert Pain management: pain level controlled Vital Signs Assessment: post-procedure vital signs reviewed and stable Respiratory status: spontaneous breathing, nonlabored ventilation and respiratory function stable Cardiovascular status: blood pressure returned to baseline and stable Postop Assessment: no apparent nausea or vomiting Anesthetic complications: yes Comments: R forearm IV infiltrated during the case. Pt is complaining of R forearm pain were the infiltration happened. The infusion was pRBC and phenylephrine. Extravasation treatment initiated per pharmacy protocol. Cap refill was undetectable. She her anterior forearm areas that are tender to palpation. She has no distal hand nuerologic deficits. Her radial pulse is strong.    No notable events documented.   Last Vitals:  Vitals:   11/10/23 1500 11/10/23 1524  BP: (!) 125/56 (!) 142/64  Pulse: 81 88  Resp: 13 18  Temp:  36.6 C  SpO2: 96% 96%    Last Pain:  Vitals:   11/10/23 1310  TempSrc:   PainSc: 5                  Foye Deer

## 2023-11-10 NOTE — [place] FL2 (Signed)
 [place] MEDICAID FL2 LEVEL OF CARE FORM     IDENTIFICATION  Patient Name: Morgan Bowen Birthdate: 08-30-1935 Sex: female Admission Date (Current Location): 11/07/2023  [facility] and [place] Number:  Chiropodist and Address:  [facility], [address] [place], [place] [address]      Provider Number: [id number]  Attending Physician Name and Address:  [name], MD  Relative Name and Phone Number:       Current Level of Care: Hospital Recommended Level of Care: Skilled Nursing Facility Prior Approval Number:    Date Approved/Denied:   PASRR Number:    Discharge Plan: SNF    Current Diagnoses: Patient Active Problem List   Diagnosis Date Noted   B12 deficiency anemia 11/09/2023   Left wrist fracture 11/08/2023   TIA (transient ischemic attack) 11/07/2023   SDH (subdural hematoma) (HCC) 11/07/2023   Acute metabolic encephalopathy 11/07/2023   Microcytic anemia 11/07/2023   HLD (hyperlipidemia) 11/07/2023   Nausea vomiting and diarrhea 11/07/2023   Acute right-sided thoracic back pain 08/16/2023   Acute right-sided low back pain without sciatica 08/16/2023   Dysuria 05/11/2023   Hyperlipidemia 05/11/2023   Pain of left lower extremity 01/05/2023   Acute pain of left knee 01/05/2023   Imbalance 12/10/2022   Acute extremity pain 12/10/2022   Neck pain 04/13/2022   Chronic foot pain 04/13/2022   Pain in joint of right knee 12/31/2021   Sinus tachycardia 06/16/2021   Blood coagulation defect (HCC) 12/18/2020   Palpitations 04/21/2020   Pain due to onychomycosis of toenails of both feet 01/22/2019   Vitamin D deficiency 06/26/2018   Decreased renal function 03/21/2018   Non-traumatic compression fracture of lumbar vertebra with routine healing 08/26/2017   Anxiety and depression 08/26/2017   Type 2 diabetes mellitus with other specified complication (HCC) 04/27/2017   Essential hypertension 04/27/2017   Osteoporosis 04/27/2017     Orientation RESPIRATION BLADDER Height & Weight     Self, Time, Situation, Place    Continent Weight: 54.4 kg Height:  5\' 5"  (165.1 cm)  BEHAVIORAL SYMPTOMS/MOOD NEUROLOGICAL BOWEL NUTRITION STATUS      Continent Diet (Heart Healthy)  AMBULATORY STATUS COMMUNICATION OF NEEDS Skin   Limited Assist Verbally                         Personal Care Assistance Level of Assistance  Bathing, Feeding, Dressing Bathing Assistance: Limited assistance Feeding assistance: Limited assistance Dressing Assistance: Limited assistance     Functional Limitations Info  Speech, Hearing, Sight Sight Info: Impaired Hearing Info: Impaired      SPECIAL CARE FACTORS FREQUENCY  PT (By licensed PT), OT (By licensed OT)     PT Frequency: 5 x week OT Frequency: 5 x week            Contractures      Additional Factors Info  Allergies, Code Status Code Status Info: FULL Allergies Info: Macrobid, Sulfamethoxazole-trimethoprim, Penicillin           Current Medications (11/10/2023):  This is the current hospital active medication list Current Facility-Administered Medications  Medication Dose Route Frequency Provider Last Rate Last Admin    stroke: early stages of recovery book   Does not apply Once Lorretta Harp, MD       0.9 %  sodium chloride infusion (Manually program via Guardrails IV Fluids)   Intravenous Once [name], MD       acetaminophen (TYLENOL) tablet  650 mg  650 mg Oral Q4H PRN Lorretta Harp, MD   650 mg at 11/09/23 1428   Or   acetaminophen (TYLENOL) 160 MG/5ML solution 650 mg  650 mg Per Tube Q4H PRN Lorretta Harp, MD       Or   acetaminophen (TYLENOL) suppository 650 mg  650 mg Rectal Q4H PRN Lorretta Harp, MD       atorvastatin (LIPITOR) tablet 40 mg  40 mg Oral Daily Lorretta Harp, MD   40 mg at 11/08/23 4098   ceFAZolin (ANCEF) IVPB 2g/100 mL premix  2 g Intravenous 30 min Pre-Op Poggi, Excell Seltzer, MD       cyanocobalamin (VITAMIN B12) injection 1,000 mcg  1,000 mcg Subcutaneous  Daily Caryl Pina, MD   1,000 mcg at 11/10/23 0817   enoxaparin (LOVENOX) injection 40 mg  40 mg Subcutaneous Q24H Lorretta Harp, MD   40 mg at 11/09/23 1191   hydrALAZINE (APRESOLINE) injection 5 mg  5 mg Intravenous Q2H PRN Lorretta Harp, MD       insulin aspart (novoLOG) injection 0-5 Units  0-5 Units Subcutaneous QHS Lorretta Harp, MD   5 Units at 11/08/23 0104   insulin aspart (novoLOG) injection 0-9 Units  0-9 Units Subcutaneous TID WC Lorretta Harp, MD   1 Units at 11/10/23 0817   lidocaine (LIDODERM) 5 % 1 patch  1 patch Transdermal Q24H Lorretta Harp, MD   1 patch at 11/08/23 2103   lipase/protease/amylase (CREON) capsule 24,000 Units  24,000 Units Oral TID Valentino Hue, MD   24,000 Units at 11/09/23 1654   methocarbamol (ROBAXIN) tablet 500 mg  500 mg Oral Q8H PRN Lorretta Harp, MD       morphine (PF) 2 MG/ML injection 2 mg  2 mg Intravenous Q4H PRN Lorretta Harp, MD   2 mg at 11/09/23 1927   ondansetron (ZOFRAN) injection 4 mg  4 mg Intravenous Q8H PRN Lorretta Harp, MD       oxyCODONE-acetaminophen (PERCOCET/ROXICET) 5-325 MG per tablet 1 tablet  1 tablet Oral Q4H PRN Lorretta Harp, MD       QUEtiapine (SEROQUEL) tablet 25 mg  25 mg Oral QHS [name], MD   25 mg at 11/09/23 2121   sodium chloride tablet 1 g  1 g Oral BID WC [name], MD   1 g at 11/09/23 1654     Discharge Medications: Please see discharge summary for a list of discharge medications.  Relevant Imaging Results:  Relevant Lab Results:   Additional Information    Cherre Blanc, RN

## 2023-11-10 NOTE — Progress Notes (Signed)
 SLP Cancellation Note  Patient Details Name: Morgan Bowen MRN: [id number] DOB: Dec 07, 1935   Cancelled treatment:       Reason Eval/Treat Not Completed: Medical issues which prohibited therapy (off the floor for orthopedic procedure) Plan to complete speech language assessment. Per MD note today "Stroke is ruled out with MRI.  Patient has been seen by neurology, not a candidate for antiplatelet treatment due to bilateral subdural hematoma.  Patient could have had bad reaction to tramadol. Patient condition has improved today, will avoid tramadol at this time (4/2). Patient feels well today, no recurrence facial droop. Mental status improved (4/3)." Suspect no SLP services indicated. However, will complete a screen when pt is appropriate/as schedule allows for verification.    [place] Tilak Oakley Clapp, MS, CCC-SLP Speech Language Pathologist Rehab Services; [facility] [phone] (ascom)    [place] J Clapp 11/10/2023, 12:03 PM

## 2023-11-10 NOTE — Anesthesia Procedure Notes (Signed)
 Anesthesia Regional Block: Supraclavicular block   Pre-Anesthetic Checklist: , timeout performed,  Correct Patient, Correct Site, Correct Laterality,  Correct Procedure, Correct Position, site marked,  Risks and benefits discussed,  Surgical consent,  Pre-op evaluation,  At surgeon's request and post-op pain management  Laterality: Upper and Left  Prep: chloraprep       Needles:  Injection technique: Single-shot  Needle Type: Stimiplex     Needle Length: 9cm  Needle Gauge: 22     Additional Needles:   Procedures:,,,, ultrasound used (permanent image in chart),,    Narrative:  Start time: 11/10/2023 10:57 AM End time: 11/10/2023 10:59 AM Injection made incrementally with aspirations every 5 mL.  Performed by: Personally  Anesthesiologist: Foye Deer, MD  Additional Notes: Patient consented for risk and benefits of nerve block including but not limited to nerve damage, failed block, bleeding and infection.  Patient voiced understanding.  Functioning IV was confirmed and monitors were applied.  Timeout done prior to procedure and prior to any sedation being given to the patient.  Patient confirmed procedure site prior to any sedation given to the patient. Sterile prep,hand hygiene and sterile gloves were used.  Minimal sedation used for procedure.  No paresthesia endorsed by patient during the procedure.  Negative aspiration and negative test dose prior to incremental administration of local anesthetic. The patient tolerated the procedure well with no immediate complications.

## 2023-11-10 NOTE — TOC Initial Note (Addendum)
 Transition of Care [facility]) - Initial/Assessment Note    Patient Details  Name: Morgan Bowen MRN: [id number] Date of Birth: 10-29-1935  [facility] of [facility] CM/SW Contact:    Cherre Blanc, RN Phone Number: 11/10/2023, 9:14 AM  Clinical Narrative:                 Patient is independent at home. She continues to drive short distances to MD appts, pharmacy, errands, etc. Her son helps organize her pills and she doesn't have any difficulty obtaining medications. She has a walker and cane. Her son stated that he is concerned that her current bed at home is to high and would like for her to have a different bed.    PT is recommending inpatient PT/OT. TOC sent FL2 to SNFs in [facility] at son's request.   [place] [phone]  TOC will continue to follow.  Expected Discharge Plan: Skilled Nursing Facility Barriers to Discharge: Continued Medical Work up, SNF Pending bed offer   Patient Goals and CMS Choice            Expected Discharge Plan and Services   Discharge Planning Services: CM Consult   Living arrangements for the past 2 months: Single Family Home                                      Prior Living Arrangements/Services Living arrangements for the past 2 months: Single Family Home Lives with:: Self                   Activities of Daily Living   ADL Screening (condition at time of admission) Independently performs ADLs?: No Does the patient have a NEW difficulty with bathing/dressing/toileting/self-feeding that is expected to last >3 days?: Yes (Initiates electronic notice to provider for possible OT consult) Does the patient have a NEW difficulty with getting in/out of bed, walking, or climbing stairs that is expected to last >3 days?: Yes (Initiates electronic notice to provider for possible PT consult) Does the patient have a NEW difficulty with communication that is expected to last >3 days?: Yes (Initiates electronic notice to provider for  possible SLP consult) Is the patient deaf or have difficulty hearing?: No Does the patient have difficulty seeing, even when wearing glasses/contacts?: No Does the patient have difficulty concentrating, remembering, or making decisions?: Yes  Permission Sought/Granted                  Emotional Assessment           Psych Involvement: No (comment)  Admission diagnosis:  TIA (transient ischemic attack) [G45.9] Altered mental status, unspecified altered mental status type [R41.82] Acute metabolic encephalopathy [G93.41] Patient Active Problem List   Diagnosis Date Noted   B12 deficiency anemia 11/09/2023   Left wrist fracture 11/08/2023   TIA (transient ischemic attack) 11/07/2023   SDH (subdural hematoma) (HCC) 11/07/2023   Acute metabolic encephalopathy 11/07/2023   Microcytic anemia 11/07/2023   HLD (hyperlipidemia) 11/07/2023   Nausea vomiting and diarrhea 11/07/2023   Acute right-sided thoracic back pain 08/16/2023   Acute right-sided low back pain without sciatica 08/16/2023   Dysuria 05/11/2023   Hyperlipidemia 05/11/2023   Pain of left lower extremity 01/05/2023   Acute pain of left knee 01/05/2023   Imbalance 12/10/2022   Acute extremity pain 12/10/2022   Neck pain 04/13/2022   Chronic foot pain 04/13/2022  Pain in joint of right knee 12/31/2021   Sinus tachycardia 06/16/2021   Blood coagulation defect (HCC) 12/18/2020   Palpitations 04/21/2020   Pain due to onychomycosis of toenails of both feet 01/22/2019   Vitamin D deficiency 06/26/2018   Decreased renal function 03/21/2018   Non-traumatic compression fracture of lumbar vertebra with routine healing 08/26/2017   Anxiety and depression 08/26/2017   Type 2 diabetes mellitus with other specified complication (HCC) 04/27/2017   Essential hypertension 04/27/2017   Osteoporosis 04/27/2017   PCP:  Doreene Nest, NP Pharmacy:   [facility] [phone] [place], [place] - [address] [place] [address] Phone: [phone] Fax: [phone]  [facility] - [address] [place] - [address] [place] [address] Phone: [phone] Fax: [phone]     Social Drivers of Health (SDOH) Social History: SDOH Screenings   Food Insecurity: No Food Insecurity (11/08/2023)  Housing: Low Risk  (11/08/2023)  Transportation Needs: No Transportation Needs (11/08/2023)  Utilities: Not At Risk (11/08/2023)  Alcohol Screen: Low Risk  (04/27/2023)  Depression (PHQ2-9): Low Risk  (05/11/2023)  Financial Resource Strain: Low Risk  (04/27/2023)  Physical Activity: Inactive (04/27/2023)  Social Connections: Socially Isolated (11/08/2023)  Stress: No Stress Concern Present (04/27/2023)  Tobacco Use: Low Risk  (11/08/2023)  Health Literacy: Adequate Health Literacy (04/27/2023)   SDOH Interventions:     Readmission Risk Interventions     No data to display

## 2023-11-10 NOTE — Progress Notes (Signed)
 PT Cancellation Note  Patient Details Name: Morgan Bowen MRN: [id number] DOB: January 07, 1936   Cancelled Treatment:    Reason Eval/Treat Not Completed: Other (comment). Pt scheduled for L wrist ORIF this date. Will need new orders to resume therapy post op. Please re-order for re-evaluation.   Liv Rallis 11/10/2023, 10:42 AM Elizabeth Palau, PT, DPT, GCS [phone]

## 2023-11-10 NOTE — Progress Notes (Signed)
 OT Cancellation Note  Patient Details Name: Morgan Bowen MRN: [id number] DOB: 11/21/1935   Cancelled Treatment:    Reason Eval/Treat Not Completed: Patient at procedure or test/ unavailable. Pt scheduled for L wrist ORIF this date. Will need new orders to resume therapy post op. Please re-order for re-evaluation.   Arman Filter., MPH, MS, OTR/L ascom [phone] 11/10/23, 11:17 AM

## 2023-11-10 NOTE — Op Note (Signed)
 11/10/2023  12:41 PM  Patient:   Morgan Bowen  Pre-Op Diagnosis:   Closed acute intra-articular distal radius fracture with ulnar styloid fracture, left wrist.  Post-Op Diagnosis:   Same.  Procedure:   Open reduction and internal fixation of left distal radius fracture.  Surgeon:   Maryagnes Amos, MD  Assistant:   Martie Round, PA-S  Anesthesia:   General LMA with interscalene block placed preoperative by the anesthesiologist  Findings:   As above.  Complications:   None  EBL:   10 cc  Fluids:   400 cc crystalloid and 200 cc of PRBC  TT:   62 minutes at 250 mmHg  Drains:   None  Closure:   3-0 Vicryl subcuticular sutures  Implants:   Biomet DVR Cross-locked narrow precontoured distal radius mini-locking plate.  Brief Clinical Note:   The patient is an 88 year old female who sustained above-noted injury 4 to 5 days ago when she apparently sustained an unwitnessed fall at home. She was brought to the emergency room where x-rays demonstrated the above-noted injury. The fracture was reduced and she was splinted and sent home.  However, she was readmitted several days ago due to altered mental status changes. Orthopedics was consulted to help with management of the fracture. Given that the postreduction films still showed unsatisfactory realignment of the fracture, it was decided to proceed with an ORIF of the placed left distal radius fracture.  Procedure:   The patient underwent placement of an interscalene block in the preoperative holding area by the anesthesiologist before she was brought into the operating room and lain in the supine position. After adequate general laryngal mask anesthesia was obtained, the patient's left hand and upper extremity were prepped with ChloraPrep solution before being draped sterilely. Preoperative antibiotics were administered. A timeout was performed to verify the appropriate surgical site before the limb was exsanguinated with an Esmarch and the  tourniquet inflated to 250 mmHg.   An approximately 7-8 cm incision was made over the volar aspect of the distal radius beginning at the volar flexion crease and extending proximally along the flexor carpi radialis tendon. The incision was carried down through the subcutaneous tissues to expose the superficial retinaculum. This was split the length of the incision directly over the flexor carpi radialis tendon. The FCR sheath was opened and the tendon retracted ulnarly to protect the median nerve. The floor of the FCR sheath was opened to expose the pronator quadratus muscle. This was noted to be quite attenuated/shredded due to the injury. Therefore, the muscle was elevated/retracted ulnarly to expose the distal radius.   The fracture was identified and soft tissues elevated off the distal metaphyseal region for several centimeters. The appropriate sized plate was selected and positioned on the distal radius. A guidewire was placed through the distal hole and its position verified using FluoroScan imaging in AP and lateral projections. After several attempts, the pin was positioned parallel to the distal articular surface and approximately 3-4 mm proximal to the articular surface. Distally, the plate was secured using 1 bicortical locking screw and 6 pegs of appropriate length.    The plate was carefully lowered onto the volar metaphyseal surface, reducing the fracture in the process. Again the position of the plate was verified using FluoroScan imaging in AP and lateral projections and found to be excellent. The plate was secured using two nonlocking bicortical screws and one locking bicortical screw proximally. Again the construct was assessed using FluoroScan imaging in AP, lateral,  and oblique projections and found to be excellent.  The wound was copiously irrigated with sterile saline solution before the subcutaneous tissues were closed using 3-0 Vicryl interrupted sutures. The subcuticular layer also  was closed using 3-0 Vicryl inverted interrupted sutures. Benzoin and Steri-Strips are applied to the skin. A total of 15 cc of 0.5% plain Sensorcaine was injected in and around the incision site to help with postoperative analgesia before a sterile bulky dressing was applied to the wound. The patient was placed into a volar splint maintaining the wrist in neutral position before the patient was awakened, extubated, and returned to the recovery room in satisfactory condition after tolerating the procedure well.

## 2023-11-10 NOTE — Progress Notes (Signed)
 Progress Note   Patient: Morgan Bowen ZOX:[id number] DOB: [birth year] DOA: 11/07/2023     2 DOS: the patient was seen and examined on 11/10/2023   Brief hospital course: Patient is a 88 year old female  with medical history significant of HTN, HLD, DM, depression with anxiety, anemia, DVT not on anticoagulants, recent left wrist fracture, who presents with AMS, slurred speech, right facial droop.  Per patient son, symptoms started after taking tramadol. Seen by neurology, MRI scan did not show any stroke.  Patient is not a candidate for antiplatelet treatment due to subdural hematoma.   Principal Problem:   Acute metabolic encephalopathy Active Problems:   SDH (subdural hematoma) (HCC)   Type 2 diabetes mellitus with other specified complication (HCC)   HLD (hyperlipidemia)   Essential hypertension   Microcytic anemia   Nausea vomiting and diarrhea   Anxiety and depression   Left wrist fracture   B12 deficiency anemia   Assessment and Plan:  Acute metabolic encephalopathy with facial droop. Bilateral subdural hematoma. Old left basal ganglia and cerebellar stroke. Stroke is ruled out with MRI.  Patient has been seen by neurology, not a candidate for antiplatelet treatment due to bilateral subdural hematoma.  Patient could have had bad reaction to tramadol. Patient condition has improved today, will avoid tramadol at this time. Patient feels well today, no recurrence facial droop.  Mental status improved.   Acute on chronic anemia. B12 deficient anemia. Patient hemoglobin has dropped down to 7.1, patient never had any black stool or rectal bleeding.  At this point, patient will be treated with B12 injection x 7 days followed with 2000 mg daily.   Hemoglobin today is 7.3, going to surgery for wrist fracture, transfuse 1 unit PRBC as patient will lose more blood during general surgery.   Chronic diarrhea. Hyponatremia. Hypokalemia. Given malabsorption of B12, patient most likely has  pancreatic insufficiency causing absorption and diarrhea.  Will start Creon. Replete potassium.  Patient also has a tendency to drink large amount of water, start fluid restriction for hyponatremia.  Also given 2 dose of sodium chloride orally, sodium minimally improved.  Potassium normal.   Uncontrolled type 2 diabetes with hyperglycemia. Continue sliding scale insulin..   Left wrist fracture status post reduction, in cast.   Follow-up with orthopedics as outpatient.     Subjective:  Patient doing well today, no additional diarrhea.  No nausea vomiting.  No shortness of breath.  Has no confusion today.  Physical Exam: Vitals:   11/10/23 0007 11/10/23 0359 11/10/23 0737 11/10/23 1025  BP: (!) 114/48 (!) 136/58 (!) 151/58 (!) 147/66  Pulse: 78 79 85 86  Resp: 20 18 14 16   Temp: 98 F (36.7 C) 98.1 F (36.7 C) 97.7 F (36.5 C) 97.9 F (36.6 C)  TempSrc: Oral Oral  Temporal  SpO2: 95% 97% 96% 94%  Weight:    54.4 kg  Height:    5\' 5"  (1.651 m)   General exam: Appears calm and comfortable  Respiratory system: Clear to auscultation. Respiratory effort normal. Cardiovascular system: S1 & S2 heard, RRR. No JVD, murmurs, rubs, gallops or clicks. No pedal edema. Gastrointestinal system: Abdomen is nondistended, soft and nontender. No organomegaly or masses felt. Normal bowel sounds heard. Central nervous system: Alert and oriented. No focal neurological deficits. Extremities: Symmetric 5 x 5 power. Skin: No rashes, lesions or ulcers Psychiatry: Judgement and insight appear normal. Mood & affect appropriate.    Data Reviewed:  Lab results reviewed.  Family Communication: Son  updated at bedside.  Disposition: Status is: Inpatient Remains inpatient appropriate because: Disease, inpatient procedure.     Time spent: 35 minutes  Author: [name], MD 11/10/2023 10:55 AM  For on call review [email or web address].

## 2023-11-10 NOTE — Anesthesia Preprocedure Evaluation (Addendum)
 Anesthesia Evaluation  Patient identified by MRN, date of birth, ID band Patient awake    Reviewed: Allergy & Precautions, H&P , NPO status , Patient's Chart, lab work & pertinent test results  Airway Mallampati: II  TM Distance: >3 FB Neck ROM: full    Dental no notable dental hx.    Pulmonary neg pulmonary ROS   Pulmonary exam normal        Cardiovascular hypertension, Normal cardiovascular exam     Neuro/Psych  PSYCHIATRIC DISORDERS      CVA    GI/Hepatic negative GI ROS, Neg liver ROS,,,  Endo/Other  diabetes    Renal/GU      Musculoskeletal   Abdominal   Peds  Hematology  (+) Blood dyscrasia, anemia   Anesthesia Other Findings 88 year old female presenting with AMS, slurred speech, R facial droop; MRI showing Bilateral chronic subdural hematomas, measuring up to 4 mm, Old left cerebellar and left basal ganglia small vessel infarcts. She had a fall from standing prior to admission for which patient does endorse hitting her head when I talked to her today. She suffered a distal radius fracture. She has anemia for which she will receive one unit of pRBCs. She is alert and oriented x4 today. She has appropriate affect and follows commands. There were focal neurologic deficits today. IM has cleared patient for surgery. Neurology has no further plans to treat or evaluate patient for her recent AMS.    PMH significant for  HTN, HLD, DM, depression with anxiety, anemia, DVT not on anticoagulants.  Past Medical History: 05/23/2018: Acute cystitis 04/03/2020: Acute deep vein thrombosis (DVT) of right tibial vein  (HCC) No date: Anxiety No date: Arthritis 06/17/2015: Bronchitis 12/13/2018: Cellulitis No date: Chickenpox 06/24/2022: COVID-19 virus infection No date: Diabetes mellitus without complication (HCC) 01/15/2022: Dizziness 12/10/2022: Fall No date: GERD (gastroesophageal reflux disease) No date:  Hypertension 04/13/2022: Laceration of skin and cutaneous sensory nerve of left  upper extremity No date: Osteoporosis No date: Type 2 diabetes mellitus (HCC) No date: Urinary tract infection  Past Surgical History: 1984: ABDOMINAL HYSTERECTOMY No date: ABDOMINAL HYSTERECTOMY 1958: APPENDECTOMY 04/25/2023: CATARACT EXTRACTION; Right No date: CHOLECYSTECTOMY No date: GALLBLADDER SURGERY 07/13/2016: MANDIBLE FRACTURE SURGERY 1958: TONSILLECTOMY No date: TONSILLECTOMY No date: WRIST FRACTURE SURGERY; Right     Comment:  2000  BMI    Body Mass Index: 19.97 kg/m      Reproductive/Obstetrics negative OB ROS                             Anesthesia Physical Anesthesia Plan  ASA: 4  Anesthesia Plan: MAC and Regional   Post-op Pain Management: Regional block*   Induction: Intravenous  PONV Risk Score and Plan:   Airway Management Planned: Natural Airway  Additional Equipment:   Intra-op Plan:   Post-operative Plan:   Informed Consent: I have reviewed the patients History and Physical, chart, labs and discussed the procedure including the risks, benefits and alternatives for the proposed anesthesia with the patient or authorized representative who has indicated his/her understanding and acceptance.     Dental advisory given  Plan Discussed with: Anesthesiologist, CRNA and Surgeon  Anesthesia Plan Comments: (Son was present)        Anesthesia Quick Evaluation

## 2023-11-10 NOTE — Transfer of Care (Signed)
 Immediate Anesthesia Transfer of Care Note  Patient: LANAH STEINES  Procedure(s) Performed: OPEN REDUCTION INTERNAL FIXATION (ORIF) RADIAL FRACTURE (Left)  Patient Location: PACU  Anesthesia Type:General  Level of Consciousness: drowsy  Airway & Oxygen Therapy: Patient Spontanous Breathing and Patient connected to face mask oxygen  Post-op Assessment: Report given to RN and Post -op Vital signs reviewed and stable  Post vital signs: Reviewed and stable  Last Vitals:  Vitals Value Taken Time  BP 147/54 11/10/23 1245  Temp    Pulse 70 11/10/23 1250  Resp 21 11/10/23 1250  SpO2 100 % 11/10/23 1250  Vitals shown include unfiled device data.  Last Pain:  Vitals:   11/10/23 1025  TempSrc: Temporal  PainSc: 0-No pain      Patients Stated Pain Goal: 0 (11/10/23 0359)  Complications: No notable events documented.

## 2023-11-10 NOTE — [place] FL2 (Signed)
 [place] MEDICAID FL2 LEVEL OF CARE FORM     IDENTIFICATION  Patient Name: Morgan Bowen Birthdate: 1936/06/23 Sex: female Admission Date (Current Location): 11/07/2023  [facility] and [place] Number:  Chiropodist and Address:  [facility], [address] [place], [place] [address]      Provider Number: [id number]  Attending Physician Name and Address:  [name], MD  Relative Name and Phone Number:       Current Level of Care: Hospital Recommended Level of Care: Skilled Nursing Facility Prior Approval Number:    Date Approved/Denied:   PASRR Number:   [id number]  Discharge Plan: SNF    Current Diagnoses: Patient Active Problem List   Diagnosis Date Noted   B12 deficiency anemia 11/09/2023   Left wrist fracture 11/08/2023   TIA (transient ischemic attack) 11/07/2023   SDH (subdural hematoma) (HCC) 11/07/2023   Acute metabolic encephalopathy 11/07/2023   Microcytic anemia 11/07/2023   HLD (hyperlipidemia) 11/07/2023   Nausea vomiting and diarrhea 11/07/2023   Acute right-sided thoracic back pain 08/16/2023   Acute right-sided low back pain without sciatica 08/16/2023   Dysuria 05/11/2023   Hyperlipidemia 05/11/2023   Pain of left lower extremity 01/05/2023   Acute pain of left knee 01/05/2023   Imbalance 12/10/2022   Acute extremity pain 12/10/2022   Neck pain 04/13/2022   Chronic foot pain 04/13/2022   Pain in joint of right knee 12/31/2021   Sinus tachycardia 06/16/2021   Blood coagulation defect (HCC) 12/18/2020   Palpitations 04/21/2020   Pain due to onychomycosis of toenails of both feet 01/22/2019   Vitamin D deficiency 06/26/2018   Decreased renal function 03/21/2018   Non-traumatic compression fracture of lumbar vertebra with routine healing 08/26/2017   Anxiety and depression 08/26/2017   Type 2 diabetes mellitus with other specified complication (HCC) 04/27/2017   Essential hypertension 04/27/2017   Osteoporosis  04/27/2017    Orientation RESPIRATION BLADDER Height & Weight     Self, Time, Situation, Place    Continent Weight: 54.4 kg Height:  5\' 5"  (165.1 cm)  BEHAVIORAL SYMPTOMS/MOOD NEUROLOGICAL BOWEL NUTRITION STATUS      Continent Diet (Heart Healthy)  AMBULATORY STATUS COMMUNICATION OF NEEDS Skin   Limited Assist Verbally                         Personal Care Assistance Level of Assistance  Bathing, Feeding, Dressing Bathing Assistance: Limited assistance Feeding assistance: Limited assistance Dressing Assistance: Limited assistance     Functional Limitations Info  Speech, Hearing, Sight Sight Info: Impaired Hearing Info: Impaired      SPECIAL CARE FACTORS FREQUENCY  PT (By licensed PT), OT (By licensed OT)     PT Frequency: 5 x week OT Frequency: 5 x week            Contractures      Additional Factors Info  Allergies, Code Status Code Status Info: FULL Allergies Info: Macrobid, Sulfamethoxazole-trimethoprim, Penicillin           Current Medications (11/10/2023):  This is the current hospital active medication list Current Facility-Administered Medications  Medication Dose Route Frequency Provider Last Rate Last Admin    stroke: early stages of recovery book   Does not apply Once Lorretta Harp, MD       0.9 %  sodium chloride infusion (Manually program via Guardrails IV Fluids)   Intravenous Once [name], MD       acetaminophen (TYLENOL)  tablet 650 mg  650 mg Oral Q4H PRN Lorretta Harp, MD   650 mg at 11/09/23 1428   Or   acetaminophen (TYLENOL) 160 MG/5ML solution 650 mg  650 mg Per Tube Q4H PRN Lorretta Harp, MD       Or   acetaminophen (TYLENOL) suppository 650 mg  650 mg Rectal Q4H PRN Lorretta Harp, MD       atorvastatin (LIPITOR) tablet 40 mg  40 mg Oral Daily Lorretta Harp, MD   40 mg at 11/08/23 1610   ceFAZolin (ANCEF) IVPB 2g/100 mL premix  2 g Intravenous 30 min Pre-Op Poggi, Excell Seltzer, MD       cyanocobalamin (VITAMIN B12) injection 1,000 mcg  1,000 mcg  Subcutaneous Daily Caryl Pina, MD   1,000 mcg at 11/10/23 0817   enoxaparin (LOVENOX) injection 40 mg  40 mg Subcutaneous Q24H Lorretta Harp, MD   40 mg at 11/09/23 9604   hydrALAZINE (APRESOLINE) injection 5 mg  5 mg Intravenous Q2H PRN Lorretta Harp, MD       insulin aspart (novoLOG) injection 0-5 Units  0-5 Units Subcutaneous QHS Lorretta Harp, MD   5 Units at 11/08/23 0104   insulin aspart (novoLOG) injection 0-9 Units  0-9 Units Subcutaneous TID WC Lorretta Harp, MD   1 Units at 11/10/23 0817   lidocaine (LIDODERM) 5 % 1 patch  1 patch Transdermal Q24H Lorretta Harp, MD   1 patch at 11/08/23 2103   lipase/protease/amylase (CREON) capsule 24,000 Units  24,000 Units Oral TID Valentino Hue, MD   24,000 Units at 11/09/23 1654   methocarbamol (ROBAXIN) tablet 500 mg  500 mg Oral Q8H PRN Lorretta Harp, MD       morphine (PF) 2 MG/ML injection 2 mg  2 mg Intravenous Q4H PRN Lorretta Harp, MD   2 mg at 11/09/23 1927   ondansetron (ZOFRAN) injection 4 mg  4 mg Intravenous Q8H PRN Lorretta Harp, MD       oxyCODONE-acetaminophen (PERCOCET/ROXICET) 5-325 MG per tablet 1 tablet  1 tablet Oral Q4H PRN Lorretta Harp, MD       QUEtiapine (SEROQUEL) tablet 25 mg  25 mg Oral QHS [name], MD   25 mg at 11/09/23 2121   sodium chloride tablet 1 g  1 g Oral BID WC [name], MD   1 g at 11/09/23 1654     Discharge Medications: Please see discharge summary for a list of discharge medications.  Relevant Imaging Results:  Relevant Lab Results:   Additional Information    Cherre Blanc, RN

## 2023-11-11 ENCOUNTER — Inpatient Hospital Stay (HOSPITAL_COMMUNITY)
Admit: 2023-11-11 | Discharge: 2023-11-11 | Disposition: A | Discharge status: Home / Self Care | Attending: Surgery | Admitting: Surgery

## 2023-11-11 ENCOUNTER — Other Ambulatory Visit: Payer: Self-pay | Admitting: Primary Care

## 2023-11-11 ENCOUNTER — Encounter: Payer: Self-pay | Admitting: Surgery

## 2023-11-11 DIAGNOSIS — S62102A Fracture of unspecified carpal bone, left wrist, initial encounter for closed fracture: Secondary | ICD-10-CM | POA: Diagnosis not present

## 2023-11-11 DIAGNOSIS — I10 Essential (primary) hypertension: Secondary | ICD-10-CM

## 2023-11-11 DIAGNOSIS — G9341 Metabolic encephalopathy: Secondary | ICD-10-CM | POA: Diagnosis not present

## 2023-11-11 DIAGNOSIS — S065XAA Traumatic subdural hemorrhage with loss of consciousness status unknown, initial encounter: Secondary | ICD-10-CM | POA: Diagnosis not present

## 2023-11-11 DIAGNOSIS — D513 Other dietary vitamin B12 deficiency anemia: Secondary | ICD-10-CM | POA: Diagnosis not present

## 2023-11-11 LAB — CBC
HCT: 23.6 % — ABNORMAL LOW (ref 36.0–46.0)
Hemoglobin: 7.6 g/dL — ABNORMAL LOW (ref 12.0–15.0)
MCH: 25.3 pg — ABNORMAL LOW (ref 26.0–34.0)
MCHC: 32.2 g/dL (ref 30.0–36.0)
MCV: 78.7 fL — ABNORMAL LOW (ref 80.0–100.0)
Platelets: 293 10*3/uL (ref 150–400)
RBC: 3 MIL/uL — ABNORMAL LOW (ref 3.87–5.11)
RDW: 15.7 % — ABNORMAL HIGH (ref 11.5–15.5)
WBC: 7.7 10*3/uL (ref 4.0–10.5)
nRBC: 0 % (ref 0.0–0.2)

## 2023-11-11 LAB — ECHOCARDIOGRAM COMPLETE BUBBLE STUDY
AR max vel: 3.11 cm2
AV Area VTI: 3.99 cm2
AV Area mean vel: 3.34 cm2
AV Mean grad: 2 mmHg
AV Peak grad: 3.9 mmHg
Ao pk vel: 0.98 m/s
Area-P 1/2: 4.15 cm2
MV VTI: 3.08 cm2
S' Lateral: 1.9 cm

## 2023-11-11 LAB — BPAM RBC
Blood Product Expiration Date: 202504222359
ISSUE DATE / TIME: 202504031111
Unit Type and Rh: 5100

## 2023-11-11 LAB — BASIC METABOLIC PANEL WITH GFR
Anion gap: 6 (ref 5–15)
BUN: 16 mg/dL (ref 8–23)
CO2: 24 mmol/L (ref 22–32)
Calcium: 8.1 mg/dL — ABNORMAL LOW (ref 8.9–10.3)
Chloride: 103 mmol/L (ref 98–111)
Creatinine, Ser: 0.84 mg/dL (ref 0.44–1.00)
GFR, Estimated: >60 mL/min (ref >60)
Glucose, Bld: 166 mg/dL — ABNORMAL HIGH (ref 70–99)
Potassium: 4 mmol/L (ref 3.5–5.1)
Sodium: 133 mmol/L — ABNORMAL LOW (ref 135–145)

## 2023-11-11 LAB — TYPE AND SCREEN
ABO/RH(D): O POS
Antibody Screen: NEGATIVE
Unit division: 0

## 2023-11-11 LAB — GLUCOSE, CAPILLARY
Glucose-Capillary: 171 mg/dL — ABNORMAL HIGH (ref 70–99)
Glucose-Capillary: 155 mg/dL — ABNORMAL HIGH (ref 70–99)
Glucose-Capillary: 213 mg/dL — ABNORMAL HIGH (ref 70–99)
Glucose-Capillary: 156 mg/dL — ABNORMAL HIGH (ref 70–99)
Glucose-Capillary: 259 mg/dL — ABNORMAL HIGH (ref 70–99)

## 2023-11-11 LAB — MAGNESIUM: Magnesium: 1.9 mg/dL (ref 1.7–2.4)

## 2023-11-11 NOTE — Evaluation (Signed)
 Physical Therapy Re-Evaluation Patient Details Name: Morgan Bowen MRN: [id number] DOB: 02-27-1936 Today's Date: 11/11/2023  History of Present Illness  Pt is an 88 year old female presenting with AMS, slurred speech, R facial droop; MRI showing Bilateral chronic subdural hematomas, measuring up to 4 mm, Old left cerebellar and left basal ganglia small vessel infarcts.  S/p ORIF L distal radius fx 11/10/23.  PMH significant for  HTN, HLD, DM, depression with anxiety, anemia, DVT not on anticoagulants, recent L wrist fracture.  Clinical Impression  PT re-evaluation performed.  Prior to recent medical concerns, pt reports being modified independent with ambulation (using rollator); lives alone in 1 level apt; h/o falls.  Pt reporting 8/10 L wrist pain beginning of session and 5/10 end of session at rest (nurse gave pt pain medication beginning of session).  Currently pt is mod assist with bed mobility and CGA to close SBA for sitting balance.  Pt reporting dizziness and then significant nausea sitting on EOB limiting activity (pt requested back to bed d/t symptoms so pt assisted back to bed which improved symptoms; pt still reporting mild nausea end of session resting in bed--nurse notified of pt's request for nausea medication; BP 153/58 with MAP 86 with HR 84 bpm resting in bed).  Pt would currently benefit from skilled PT to address noted impairments and functional limitations (see below for any additional details).  Upon hospital discharge, pt would benefit from ongoing therapy.     If plan is discharge home, recommend the following: A lot of help with walking and/or transfers;A lot of help with bathing/dressing/bathroom;Assist for transportation;Help with stairs or ramp for entrance   Can travel by private vehicle        Equipment Recommendations Other (comment) (TBD)  Recommendations for Other Services       Functional Status Assessment Patient has had a recent decline in their functional status  and demonstrates the ability to make significant improvements in function in a reasonable and predictable amount of time.     Precautions / Restrictions Precautions Precautions: Fall Recall of Precautions/Restrictions: Intact Required Braces or Orthoses: Splint/Cast Splint/Cast: L UE Restrictions Weight Bearing Restrictions Per Provider Order: Yes LUE Weight Bearing Per Provider Order: Non weight bearing Other Position/Activity Restrictions: NWB through L wrist/hand; may use platform walker if necessary      Mobility  Bed Mobility Overal bed mobility: Needs Assistance Bed Mobility: Supine to Sit, Sit to Supine     Supine to sit: Mod assist Sit to supine: Mod assist   General bed mobility comments: assist for trunk; vc's for technique    Transfers Overall transfer level:  (Deferred d/t nausea and dizziness in sitting)                      Ambulation/Gait                  Stairs            Wheelchair Mobility     Tilt Bed    Modified Rankin (Stroke Patients Only)       Balance Overall balance assessment: Needs assistance Sitting-balance support: Single extremity supported, Feet supported Sitting balance-Leahy Scale: Good Sitting balance - Comments: steady reaching within BOS                                     Pertinent Vitals/Pain Pain Assessment Pain Assessment: 0-10 Pain  Score: 5  Pain Location: L wrist Pain Descriptors / Indicators: Aching, Discomfort Pain Intervention(s): Limited activity within patient's tolerance, Monitored during session, Repositioned, Patient requesting pain meds-RN notified, RN gave pain meds during session    Home Living Family/patient expects to be discharged to:: Private residence Living Arrangements: Alone (Son/DIL will come stay over night with pt) Available Help at Discharge: Family;Available PRN/intermittently Type of Home: Apartment Home Access: Level entry       Home Layout:  One level Home Equipment: Rollator (4 wheels);Grab bars - tub/shower;Grab bars - toilet;Cane - single point      Prior Function Prior Level of Function : Independent/Modified Independent             Mobility Comments: Pt with fall getting out of car (with broken L arm). History of other remote falls. Using rollator for about 1 year ADLs Comments: MOD I-I in ADL/IADL, was driving; had assist from family/friends for IADLs     Extremity/Trunk Assessment   Upper Extremity Assessment Upper Extremity Assessment: Generalized weakness LUE Deficits / Details: s/p L UE sx (immobilized) LUE: Unable to fully assess due to pain    Lower Extremity Assessment Lower Extremity Assessment: Generalized weakness       Communication   Communication Communication: No apparent difficulties    Cognition Arousal: Alert Behavior During Therapy: WFL for tasks assessed/performed   PT - Cognitive impairments: History of cognitive impairments                       PT - Cognition Comments: Pt oriented to self/location/general situation. Following commands: Impaired Following commands impaired: Follows one step commands inconsistently     Cueing Cueing Techniques: Verbal cues, Gestural cues, Tactile cues, Visual cues     General Comments General comments (skin integrity, edema, etc.): R UE bruising noted.  Pt's son present during session.    Exercises     Assessment/Plan    PT Assessment Patient needs continued PT services  PT Problem List Decreased strength;Decreased activity tolerance;Decreased balance;Decreased mobility;Decreased coordination;Decreased cognition;Decreased knowledge of use of DME;Decreased safety awareness       PT Treatment Interventions DME instruction;Gait training;Stair training;Therapeutic exercise;Balance training    PT Goals (Current goals can be found in the Care Plan section)  Acute Rehab PT Goals Patient Stated Goal: to improve walking PT Goal  Formulation: With patient/family Time For Goal Achievement: 12/02/23 Potential to Achieve Goals: Good    Frequency Min 2X/week     Co-evaluation               AM-PAC PT "6 Clicks" Mobility  Outcome Measure Help needed turning from your back to your side while in a flat bed without using bedrails?: A Little Help needed moving from lying on your back to sitting on the side of a flat bed without using bedrails?: A Lot Help needed moving to and from a bed to a chair (including a wheelchair)?: A Little Help needed standing up from a chair using your arms (e.g., wheelchair or bedside chair)?: A Lot Help needed to walk in hospital room?: A Lot Help needed climbing 3-5 steps with a railing? : A Lot 6 Click Score: 14    End of Session Equipment Utilized During Treatment: Gait belt Activity Tolerance: Other (comment) (Limited d/t nausea and dizziness) Patient left: in bed;with call bell/phone within reach;with bed alarm set;with family/visitor present;Other (comment) (L UE elevated via pillows) Nurse Communication: Mobility status;Precautions;Other (comment) (pt's dizziness and nausea; pt requesting nausea  medication; pt's pain level) PT Visit Diagnosis: Unsteadiness on feet (R26.81);Muscle weakness (generalized) (M62.81);History of falling (Z91.81);Difficulty in walking, not elsewhere classified (R26.2);Other symptoms and signs involving the nervous system (R29.898)    Time: 0960-4540 PT Time Calculation (min) (ACUTE ONLY): 30 min   Charges:   PT Evaluation $PT Re-evaluation: 1 Re-eval   PT General Charges $$ ACUTE PT VISIT: 1 Visit        Hendricks Limes, PT 11/11/23, 4:10 PM

## 2023-11-11 NOTE — Progress Notes (Signed)
 Progress Note   Patient: Morgan Bowen NUU:[id number] DOB: 04-11-1936 DOA: 11/07/2023     3 DOS: the patient was seen and examined on 11/11/2023   Brief hospital course: Patient is a 88 year old female  with medical history significant of HTN, HLD, DM, depression with anxiety, anemia, DVT not on anticoagulants, recent left wrist fracture, who presents with AMS, slurred speech, right facial droop.  Per patient son, symptoms started after taking tramadol. Seen by neurology, MRI scan did not show any stroke.  Patient is not a candidate for antiplatelet treatment due to subdural hematoma. Patient also had open reduction and internal fixation of a left distal radius fracture 4/3.   Condition has improved, currently pending nursing home placement.   Principal Problem:   Acute metabolic encephalopathy Active Problems:   SDH (subdural hematoma) (HCC)   Type 2 diabetes mellitus with other specified complication (HCC)   HLD (hyperlipidemia)   Essential hypertension   Microcytic anemia   Nausea vomiting and diarrhea   Anxiety and depression   Left wrist fracture   B12 deficiency anemia   Assessment and Plan: Acute metabolic encephalopathy with facial droop. Bilateral subdural hematoma. Old left basal ganglia and cerebellar stroke. Stroke is ruled out with MRI.  Patient has been seen by neurology, not a candidate for antiplatelet treatment due to bilateral subdural hematoma.  Patient could have had bad reaction to tramadol. Patient condition has improved today, will avoid tramadol at this time. Patient feels well today, no recurrence facial droop.  Mental status improved.   Acute on chronic anemia. B12 deficient anemia. Patient hemoglobin has dropped down to 7.1, patient never had any black stool or rectal bleeding.  At this point, patient will be treated with B12 injection x 7 days followed with 2000 mg daily.   Patient received a unit of PRBC on 3/4 for surgery.  Hemoglobin is improving.    Chronic diarrhea. Hyponatremia. Hypokalemia. Given malabsorption of B12, patient most likely has pancreatic insufficiency causing absorption and diarrhea.  Started Creon. Replete potassium.  Patient also has a tendency to drink large amount of water, start fluid restriction for hyponatremia.  Also given 2 dose of sodium chloride orally, sodium improved.  Diarrhea also improving. Skip lab tomorrow,   Uncontrolled type 2 diabetes with hyperglycemia. Continue sliding scale insulin..   Left wrist fracture status post reduction, in cast.   Postsurgery.     CODE STATUS. Discussed with the patient and son, decision made to change to DO NOT RESUSCITATE status.      Subjective:  Patient doing well today, arm pain under control.  No significant confusion  Physical Exam: Vitals:   11/10/23 1524 11/10/23 2019 11/11/23 0512 11/11/23 0920  BP: (!) 142/64 (!) 160/52 (!) 133/51 (!) 162/69  Pulse: 88 96 81 88  Resp: 18 18 18 18   Temp: 97.8 F (36.6 C) 97.6 F (36.4 C) 98.4 F (36.9 C)   TempSrc:      SpO2: 96% 97% 96% 93%  Weight:      Height:       General exam: Appears calm and comfortable  Respiratory system: Clear to auscultation. Respiratory effort normal. Cardiovascular system: S1 & S2 heard, RRR. No JVD, murmurs, rubs, gallops or clicks. No pedal edema. Gastrointestinal system: Abdomen is nondistended, soft and nontender. No organomegaly or masses felt. Normal bowel sounds heard. Central nervous system: Alert and oriented x2. No focal neurological deficits. Extremities: Symmetric 5 x 5 power. Skin: No rashes, lesions or ulcers Psychiatry: Mood &  affect appropriate.    Data Reviewed:  Lab results reviewed.  Family Communication: Son Updated at bedside.  Disposition: Status is: Inpatient Remains inpatient appropriate because: Unsafe discharge option.  Pending nursing placement.     Time spent: 50 minutes  Author: [name], MD 11/11/2023 1:21 PM  For on call  review [email or web address].

## 2023-11-11 NOTE — Plan of Care (Signed)
  Problem: Education: Goal: Ability to describe self-care measures that may prevent or decrease complications (Diabetes Survival Skills Education) will improve Outcome: Progressing Goal: Individualized Educational Video(s) Outcome: Progressing   Problem: Coping: Goal: Ability to adjust to condition or change in health will improve Outcome: Progressing   Problem: Fluid Volume: Goal: Ability to maintain a balanced intake and output will improve Outcome: Progressing   Problem: Health Behavior/Discharge Planning: Goal: Ability to identify and utilize available resources and services will improve Outcome: Progressing Goal: Ability to manage health-related needs will improve Outcome: Progressing   Problem: Metabolic: Goal: Ability to maintain appropriate glucose levels will improve Outcome: Progressing   Problem: Nutritional: Goal: Maintenance of adequate nutrition will improve Outcome: Progressing Goal: Progress toward achieving an optimal weight will improve Outcome: Progressing   Problem: Skin Integrity: Goal: Risk for impaired skin integrity will decrease Outcome: Progressing   Problem: Tissue Perfusion: Goal: Adequacy of tissue perfusion will improve Outcome: Progressing   Problem: Education: Goal: Knowledge of General Education information will improve Description: Including pain rating scale, medication(s)/side effects and non-pharmacologic comfort measures Outcome: Progressing   Problem: Health Behavior/Discharge Planning: Goal: Ability to manage health-related needs will improve Outcome: Progressing   Problem: Clinical Measurements: Goal: Ability to maintain clinical measurements within normal limits will improve Outcome: Progressing Goal: Will remain free from infection Outcome: Progressing Goal: Diagnostic test results will improve Outcome: Progressing Goal: Respiratory complications will improve Outcome: Progressing Goal: Cardiovascular complication will  be avoided Outcome: Progressing   Problem: Activity: Goal: Risk for activity intolerance will decrease Outcome: Progressing   Problem: Nutrition: Goal: Adequate nutrition will be maintained Outcome: Progressing   Problem: Coping: Goal: Level of anxiety will decrease Outcome: Progressing   Problem: Elimination: Goal: Will not experience complications related to bowel motility Outcome: Progressing Goal: Will not experience complications related to urinary retention Outcome: Progressing   Problem: Pain Managment: Goal: General experience of comfort will improve and/or be controlled Outcome: Progressing   Problem: Safety: Goal: Ability to remain free from injury will improve Outcome: Progressing   Problem: Skin Integrity: Goal: Risk for impaired skin integrity will decrease Outcome: Progressing  ------------------------------------------------------------------------------  MRI ruled out stroke  Problem: Education: Goal: Knowledge of disease or condition will improve Outcome: Not Applicable Goal: Knowledge of secondary prevention will improve (MUST DOCUMENT ALL) Outcome: Not Applicable Goal: Knowledge of patient specific risk factors will improve (DELETE if not current risk factor) Outcome: Not Applicable   Problem: Ischemic Stroke/TIA Tissue Perfusion: Goal: Complications of ischemic stroke/TIA will be minimized Outcome: Not Applicable   Problem: Coping: Goal: Will verbalize positive feelings about self Outcome: Not Applicable Goal: Will identify appropriate support needs Outcome: Not Applicable   Problem: Health Behavior/Discharge Planning: Goal: Ability to manage health-related needs will improve Outcome: Not Applicable Goal: Goals will be collaboratively established with patient/family Outcome: Not Applicable   Problem: Self-Care: Goal: Ability to participate in self-care as condition permits will improve Outcome: Not Applicable Goal: Verbalization of  feelings and concerns over difficulty with self-care will improve Outcome: Not Applicable Goal: Ability to communicate needs accurately will improve Outcome: Not Applicable   Problem: Nutrition: Goal: Risk of aspiration will decrease Outcome: Not Applicable Goal: Dietary intake will improve Outcome: Not Applicable

## 2023-11-11 NOTE — Plan of Care (Signed)
 TTE: 1. Left ventricular ejection fraction, by estimation, is 65 to 70%. The  left ventricle has normal function. The left ventricle has no regional  wall motion abnormalities. Left ventricular diastolic parameters are  consistent with Grade I diastolic  dysfunction (impaired relaxation).   2. Right ventricular systolic function is normal. The right ventricular  size is normal.   3. The mitral valve is normal in structure. No evidence of mitral valve  regurgitation.   4. The aortic valve is tricuspid. Aortic valve regurgitation is mild.  Aortic valve sclerosis is present, with no evidence of aortic valve  stenosis.   5. Agitated saline contrast bubble study was negative, with no evidence  of any interatrial shunt.   EEG:  Intermittent generalized slowing. The findings are suggestive of mild diffuse encephalopathy.   Assessment: 88 year old female presenting with acute onset of confusion and right facial droop approximately 24 hours after she had been treated for a LUE fracture secondary to a fall, during which she may have struck her head.  - MRI brain: No acute intracranial abnormality. Bilateral chronic subdural hematomas, measuring up to 4 mm. Old left cerebellar and left basal ganglia small vessel infarcts. - CTA of head and neck: Atherosclerosis - Labs showed severe B12 deficiency.  - EEG: Consistent with her encephalopathy. No electrographic seizures seen.  - TTE: Unremarkable  - Overall impression: Acute onset of confusion and right facial droop, possibly secondary to TIA. Given her fall, a postconcussive syndrome is relatively high on the DDx. Low B12 level was noted. She also has diffuse cerebral atrophy, which could decrease her neurological reserve. Subclinical seizure with postictal state is also on the DDx.    Recommendations: - Vitamin B12 repletion with 1000 mcg SQ every day x 1 week, then 2000 mcg po every day indefinitely thereafter. Will need a repeat level drawn in 1  month.  - Cardiac telemetry - Discontinue her PRN home Tramadol (has not been restarted this admission).  - Frequent neuro checks - Given her R > L chronic subdural hematomas (right appears on MRI to be more recent than left, based on the signal characteristics), would not start ASA. Risks of hematoma expansion are felt to significantly outweigh the benefits of ASA for stroke prophylaxis.  - Neurohospitalist service will sign off. Please call if there are additional questions.   Electronically signed: Dr. Caryl Pina

## 2023-11-11 NOTE — Progress Notes (Signed)
 Subjective: 1 Day Post-Op Procedure(s) (LRB): OPEN REDUCTION INTERNAL FIXATION (ORIF) RADIAL FRACTURE (Left) Patient reports pain as moderate.   Patient is well, but has had some minor complaints of increased discomfort around her operative wrist, some drainage into the bandage and nausea.   Plan is to go Rehab after hospital stay.  Objective: Vital signs in last 24 hours: Temp:  [97.6 F (36.4 C)-98.4 F (36.9 C)] 98.4 F (36.9 C) (04/04 0512) Pulse Rate:  [68-96] 81 (04/04 0512) Resp:  [12-22] 18 (04/04 0512) BP: (121-174)/(46-66) 133/51 (04/04 0512) SpO2:  [93 %-100 %] 96 % (04/04 0512) Weight:  [54.4 kg] 54.4 kg (04/03 1025)  Intake/Output from previous day:  Intake/Output Summary (Last 24 hours) at 11/11/2023 0813 Last data filed at 11/11/2023 0530 Gross per 24 hour  Intake 2694.78 ml  Output 10 ml  Net 2684.78 ml    Intake/Output this shift: No intake/output data recorded.  Labs: Recent Labs    11/09/23 0359 11/10/23 0348 11/11/23 0450  HGB 7.1* 7.3* 7.6*   Recent Labs    11/10/23 0348 11/11/23 0450  WBC 6.9 7.7  RBC 2.94* 3.00*  HCT 23.2* 23.6*  PLT 307 293   Recent Labs    11/10/23 0348 11/11/23 0450  NA 134* 133*  K 4.5 4.0  CL 104 103  CO2 24 24  BUN 20 16  CREATININE 0.93 0.84  GLUCOSE 174* 166*  CALCIUM 8.5* 8.1*   No results for input(s): "LABPT", "INR" in the last 72 hours.  EXAM General - Patient is Alert, Appropriate, and Oriented Extremity - Neurologically intact Neurovascular intact Sensation intact distally Intact pulses distally No cellulitis present Compartment soft Dressing - moderate drainage. Patient's ace wrap and overlying gauze were removed. Found to have a minimal amount of drainage from incision, but mostly from a small skin tear on the dorsum aspect of the wrist, without appreciable active drainage.  A clean gauze and gauze wrap were applied. The splint was reapplied and overlying ABD pad were applied. Covered and  re-wrapped with ACE bandage.  Motor Function - intact, moving fingers well on exam.   Past Medical History:  Diagnosis Date   Acute cystitis 05/23/2018   Acute deep vein thrombosis (DVT) of right tibial vein (HCC) 04/03/2020   Anxiety    Arthritis    Bronchitis 06/17/2015   Cellulitis 12/13/2018   Chickenpox    COVID-19 virus infection 06/24/2022   Diabetes mellitus without complication (HCC)    Dizziness 01/15/2022   Fall 12/10/2022   GERD (gastroesophageal reflux disease)    Hypertension    Laceration of skin and cutaneous sensory nerve of left upper extremity 04/13/2022   Osteoporosis    Type 2 diabetes mellitus (HCC)    Urinary tract infection     Assessment/Plan: 1 Day Post-Op Procedure(s) (LRB): OPEN REDUCTION INTERNAL FIXATION (ORIF) RADIAL FRACTURE (Left) Principal Problem:   Acute metabolic encephalopathy Active Problems:   Type 2 diabetes mellitus with other specified complication (HCC)   Essential hypertension   Anxiety and depression   SDH (subdural hematoma) (HCC)   Microcytic anemia   HLD (hyperlipidemia)   Nausea vomiting and diarrhea   Left wrist fracture   B12 deficiency anemia  Estimated body mass index is 19.97 kg/m as calculated from the following:   Height as of this encounter: 5\' 5"  (1.651 m).   Weight as of this encounter: 54.4 kg. Advance diet  A new dressing and ACE wrap has been applied. Splint intact   Ice applied  Encouraged elevation and continue to move fingers to prevent stiffness   VSS  Labs reviewed, Hgb at 7.6, HCT at 23.6  Pending SNF placement  Patient will follow-up with [facility] orthoped[facility] in 2 weeks for reevaluation  Rayburn Go, PA-C [facility] 11/11/2023, 8:13 AM

## 2023-11-11 NOTE — Care Management Important Message (Signed)
 Important Message  Patient Details  Name: Morgan Bowen MRN: [id number] Date of Birth: [birth year]   Important Message Given:  Yes - Medicare IM     Breylon Sherrow W, CMA 11/11/2023, 10:46 AM

## 2023-11-11 NOTE — Inpatient Diabetes Management (Signed)
 Inpatient Diabetes Program Recommendations  AACE/ADA: New Consensus Statement on Inpatient Glycemic Control   Target Ranges:  Prepandial:   less than 140 mg/dL      Peak postprandial:   less than 180 mg/dL (1-2 hours)      Critically ill patients:  140 - 180 mg/dL    Latest Reference Range & Units 11/10/23 07:47 11/10/23 10:31 11/10/23 12:46 11/10/23 16:49 11/10/23 20:18 11/10/23 22:38 11/11/23 05:11 11/11/23 09:16  Glucose-Capillary 70 - 99 mg/dL 540 (H) 93 981 (H) 191 (H) 336 (H) 250 (H) 171 (H) 155 (H)   Review of Glycemic Control  Diabetes history: DM2 Outpatient Diabetes medications: Metformin 1000 mg BID Current orders for Inpatient glycemic control: Novolog 0-9 units TID with meals, Novolog 0-5 units QHS  Inpatient Diabetes Program Recommendations:    Insulin: May want to consider ordering Novolog 2 units TID with meals for meal coverage if patient eats at least 50% of meals.  Thanks, Orlando Penner, RN, MSN, CDCES Diabetes Coordinator Inpatient Diabetes Program [phone] (Team Pager from 8am to 5pm)

## 2023-11-11 NOTE — Progress Notes (Signed)
 SLP Cancellation Note  Patient Details Name: Morgan Bowen MRN: [id number] DOB: 1936-07-26   Cancelled treatment:       Reason Eval/Treat Not Completed: SLP screened, no needs identified, will sign off  Pt awake with her son present. They both report return to baseline cognitive communication abilities. No further services are indicated at this time.   [name] B. Dreama Saa, M.S., CCC-SLP, CBIS Speech-Language Pathologist Certified Brain Injury Specialist [facility] [phone] Ascom [phone] Fax [phone]   [name] Dreama Saa 11/11/2023, 11:20 AM

## 2023-11-11 NOTE — Progress Notes (Signed)
*  PRELIMINARY RESULTS* Echocardiogram 2D Echocardiogram has been performed.  Cristela Blue 11/11/2023, 9:04 AM

## 2023-11-11 NOTE — Progress Notes (Addendum)
 OT Cancellation Note  Patient Details Name: Morgan Bowen MRN: [id number] DOB: [birth year]   Cancelled Treatment:    Reason Eval/Treat Not Completed: Pain limiting ability to participate;Other (comment). Pt currently 8/10 pain, awaiting meds. Discussed with RN - MD is changing pt's dressing at 11 AM. OT will check back and perform re-eval as able.   Addendum: pt feeling nauseated and dizzy with PT this afternoon. Pt sleeping comfortably after pain meds. OT will re-attempt as able.    Lillyona Polasek L. Jama Krichbaum, OTR/L  11/11/23, 10:29 AM

## 2023-11-12 DIAGNOSIS — I10 Essential (primary) hypertension: Secondary | ICD-10-CM | POA: Diagnosis not present

## 2023-11-12 DIAGNOSIS — S62102A Fracture of unspecified carpal bone, left wrist, initial encounter for closed fracture: Secondary | ICD-10-CM | POA: Diagnosis not present

## 2023-11-12 DIAGNOSIS — G9341 Metabolic encephalopathy: Secondary | ICD-10-CM | POA: Diagnosis not present

## 2023-11-12 LAB — GLUCOSE, CAPILLARY
Glucose-Capillary: 139 mg/dL — ABNORMAL HIGH (ref 70–99)
Glucose-Capillary: 158 mg/dL — ABNORMAL HIGH (ref 70–99)
Glucose-Capillary: 182 mg/dL — ABNORMAL HIGH (ref 70–99)
Glucose-Capillary: 161 mg/dL — ABNORMAL HIGH (ref 70–99)
Glucose-Capillary: 252 mg/dL — ABNORMAL HIGH (ref 70–99)

## 2023-11-12 MED ORDER — CYANOCOBALAMIN 1000 MCG/ML IJ SOLN
1000.0000 ug | Freq: Every day | INTRAMUSCULAR | Status: AC
Start: 2023-11-12 — End: 2023-11-16
  Administered 2023-11-12 – 2023-11-15 (×4): 1000 ug via INTRAMUSCULAR
  Filled 2023-11-12 (×4): qty 1

## 2023-11-12 MED ORDER — LOSARTAN POTASSIUM 50 MG PO TABS
100.0000 mg | ORAL_TABLET | Freq: Every day | ORAL | Status: DC
  Administered 2023-11-12 – 2023-11-16 (×5): 100 mg via ORAL
  Filled 2023-11-12 (×5): qty 2

## 2023-11-12 MED ORDER — OXYCODONE-ACETAMINOPHEN 5-325 MG PO TABS
1.0000 | ORAL_TABLET | Freq: Four times a day (QID) | ORAL | 0 refills | Status: DC | PRN
Start: 2023-11-12 — End: 2023-11-12

## 2023-11-12 MED ORDER — OXYCODONE-ACETAMINOPHEN 5-325 MG PO TABS
1.0000 | ORAL_TABLET | ORAL | Status: DC | PRN
  Administered 2023-11-12 – 2023-11-16 (×9): 1 via ORAL
  Filled 2023-11-12 (×9): qty 1

## 2023-11-12 MED ORDER — OXYCODONE-ACETAMINOPHEN 5-325 MG PO TABS: 1.0000 | ORAL_TABLET | Freq: Four times a day (QID) | ORAL | 0 refills | Status: DC | PRN

## 2023-11-12 NOTE — Progress Notes (Signed)
 Progress Note   Patient: Morgan Bowen NWG:[id number] DOB: 11/20/1935 DOA: 11/07/2023     4 DOS: the patient was seen and examined on 11/12/2023   Brief hospital course: Patient is a 88 year old female  with medical history significant of HTN, HLD, DM, depression with anxiety, anemia, DVT not on anticoagulants, recent left wrist fracture, who presents with AMS, slurred speech, right facial droop.  Per patient son, symptoms started after taking tramadol. Seen by neurology, MRI scan did not show any stroke.  Patient is not a candidate for antiplatelet treatment due to subdural hematoma. Patient also had open reduction and internal fixation of a left distal radius fracture 4/3.   Condition has improved, currently pending nursing home placement.   Principal Problem:   Acute metabolic encephalopathy Active Problems:   SDH (subdural hematoma) (HCC)   Type 2 diabetes mellitus with other specified complication (HCC)   HLD (hyperlipidemia)   Essential hypertension   Microcytic anemia   Nausea vomiting and diarrhea   Anxiety and depression   Left wrist fracture   B12 deficiency anemia   Assessment and Plan:  Acute metabolic encephalopathy with facial droop. Bilateral subdural hematoma. Old left basal ganglia and cerebellar stroke. Stroke is ruled out with MRI.  Patient has been seen by neurology, not a candidate for antiplatelet treatment due to bilateral subdural hematoma.  Patient could have had bad reaction to tramadol. Patient condition has improved today, will avoid tramadol at this time. Patient feels well today, no recurrence facial droop.  Mental status improved. Echocardiogram showed ejection fraction of 65 to 70% with diastolic dysfunction.   Acute on chronic anemia. B12 deficient anemia. Patient hemoglobin has dropped down to 7.1, patient never had any black stool or rectal bleeding.  At this point, patient will be treated with B12 injection x 7 days followed with 2000 mg daily.    Patient received a unit of PRBC on 3/4 for surgery.  Recheck hemoglobin tomorrow.   Chronic diarrhea. Hyponatremia. Hypokalemia. Given malabsorption of B12, patient most likely has pancreatic insufficiency causing absorption and diarrhea.  Started Creon. Replete potassium.  Patient also has a tendency to drink large amount of water, start fluid restriction for hyponatremia.  Also given 2 dose of sodium chloride orally, sodium improved.  Diarrhea also improving. Recheck levels tomorrow.   Uncontrolled type 2 diabetes with hyperglycemia. Continue sliding scale insulin..   Left wrist fracture status post reduction, in cast.   Postsurgery.  Continue as needed oral pain medicine, discontinue morphine.  Essential hypertension. Blood pressure running high, restart ARB.    Subjective:  Patient has no complaint today.  Physical Exam: Vitals:   11/11/23 2016 11/11/23 2341 11/12/23 0554 11/12/23 0757  BP: (!) 181/72 (!) 139/56 (!) 155/58 (!) 146/59  Pulse: 99 91 84 84  Resp: 18  20 16   Temp: 98.3 F (36.8 C)  97.9 F (36.6 C) 98.9 F (37.2 C)  TempSrc:   Oral   SpO2: 94%  94% 93%  Weight:      Height:       General exam: Appears calm and comfortable  Respiratory system: Clear to auscultation. Respiratory effort normal. Cardiovascular system: S1 & S2 heard, RRR. No JVD, murmurs, rubs, gallops or clicks. No pedal edema. Gastrointestinal system: Abdomen is nondistended, soft and nontender. No organomegaly or masses felt. Normal bowel sounds heard. Central nervous system: Alert and oriented x2. No focal neurological deficits. Extremities: Symmetric 5 x 5 power. Skin: No rashes, lesions or ulcers Psychiatry: Judgement and  insight appear normal. Mood & affect appropriate.    Data Reviewed:  No new labs today.  Family Communication: None  Disposition: Status is: Inpatient Remains inpatient appropriate because: Unsafe discharge, pending nursing home placement.     Time  spent: 35 minutes  Author: [name], MD 11/12/2023 12:20 PM  For on call review [email or web address].

## 2023-11-12 NOTE — Progress Notes (Signed)
 Subjective: 2 Days Post-Op Procedure(s) (LRB): OPEN REDUCTION INTERNAL FIXATION (ORIF) RADIAL FRACTURE (Left) Patient reports pain as mild in the left wrist this morning.  Sleeping when entering the room.  Patient is well, and has had no acute complaints or problems  Plan is to go Rehab after hospital stay. Denies any N/V this AM. No recent fevers.  Objective: Vital signs in last 24 hours: Temp:  [97.7 F (36.5 C)-98.3 F (36.8 C)] 97.9 F (36.6 C) (04/05 0554) Pulse Rate:  [84-99] 84 (04/05 0554) Resp:  [18-20] 20 (04/05 0554) BP: (139-181)/(56-72) 155/58 (04/05 0554) SpO2:  [93 %-94 %] 94 % (04/05 0554)  Intake/Output from previous day:  Intake/Output Summary (Last 24 hours) at 11/12/2023 0715 Last data filed at 11/11/2023 1528 Gross per 24 hour  Intake 0 ml  Output --  Net 0 ml    Intake/Output this shift: No intake/output data recorded.  Labs: Recent Labs    11/10/23 0348 11/11/23 0450  HGB 7.3* 7.6*   Recent Labs    11/10/23 0348 11/11/23 0450  WBC 6.9 7.7  RBC 2.94* 3.00*  HCT 23.2* 23.6*  PLT 307 293   Recent Labs    11/10/23 0348 11/11/23 0450  NA 134* 133*  K 4.5 4.0  CL 104 103  CO2 24 24  BUN 20 16  CREATININE 0.93 0.84  GLUCOSE 174* 166*  CALCIUM 8.5* 8.1*   No results for input(s): "LABPT", "INR" in the last 72 hours.  EXAM General - Patient is Alert, Appropriate, and Oriented Extremity - Neurologically intact Neurovascular intact Sensation intact distally Intact pulses distally No cellulitis present Compartment soft Dressing - Short arm splint intact to the left arm.  No drainage noted. Swelling improving to the fingers.  She is able to flex and extend fingers without pain.   Intact to light touch over the fingers.  Cap refill intact.  Past Medical History:  Diagnosis Date   Acute cystitis 05/23/2018   Acute deep vein thrombosis (DVT) of right tibial vein (HCC) 04/03/2020   Anxiety    Arthritis    Bronchitis 06/17/2015    Cellulitis 12/13/2018   Chickenpox    COVID-19 virus infection 06/24/2022   Diabetes mellitus without complication (HCC)    Dizziness 01/15/2022   Fall 12/10/2022   GERD (gastroesophageal reflux disease)    Hypertension    Laceration of skin and cutaneous sensory nerve of left upper extremity 04/13/2022   Osteoporosis    Type 2 diabetes mellitus (HCC)    Urinary tract infection     Assessment/Plan: 2 Days Post-Op Procedure(s) (LRB): OPEN REDUCTION INTERNAL FIXATION (ORIF) RADIAL FRACTURE (Left) Principal Problem:   Acute metabolic encephalopathy Active Problems:   Type 2 diabetes mellitus with other specified complication (HCC)   Essential hypertension   Anxiety and depression   SDH (subdural hematoma) (HCC)   Microcytic anemia   HLD (hyperlipidemia)   Nausea vomiting and diarrhea   Left wrist fracture   B12 deficiency anemia  Estimated body mass index is 19.97 kg/m as calculated from the following:   Height as of this encounter: 5\' 5"  (1.651 m).   Weight as of this encounter: 54.4 kg. Advance diet  Labs and vitals reviewed. No recent fevers. Short arm splint intact without drainage.  New wrap was applied yesterday. Continue to work with PT.  Following discharge, follow-up with [facility] in 10-14 days for splint removal, suture removal and x-rays of the left wrist. NWB to the left wrist.  Valeria Batman,  PA-C, CAQ-OS [facility] 11/12/2023, 7:15 AM

## 2023-11-12 NOTE — Discharge Instructions (Signed)
 Diet: As you were doing prior to hospitalization   Shower:  May shower but keep the wounds dry, use an occlusive plastic wrap, NO SOAKING IN TUB.  If the bandage gets wet, change with a clean dry gauze.  Dressing:  Leave dressing intact to the left wrist until first visit.  Activity:  Increase activity slowly as tolerated, but follow the weight bearing instructions below.  No lifting or driving for 6 weeks.  Weight Bearing:   Non-weightbearing to the left arm.  To prevent constipation: you may use a stool softener such as -  Colace (over the counter) 100 mg by mouth twice a day  Drink plenty of fluids (prune juice may be helpful) and high fiber foods Miralax (over the counter) for constipation as needed.    Itching:  If you experience itching with your medications, try taking only a single pain pill, or even half a pain pill at a time.  You may take up to 10 pain pills per day, and you can also use benadryl over the counter for itching or also to help with sleep.   Precautions:  If you experience chest pain or shortness of breath - call 911 immediately for transfer to the hospital emergency department!!  If you develop a fever greater that 101 F, purulent drainage from wound, increased redness or drainage from wound, or calf pain-Call [facility]                                              Follow- Up Appointment:  Please call for an appointment to be seen in 2 weeks at [facility]

## 2023-11-13 DIAGNOSIS — S065XAA Traumatic subdural hemorrhage with loss of consciousness status unknown, initial encounter: Secondary | ICD-10-CM | POA: Diagnosis not present

## 2023-11-13 DIAGNOSIS — G9341 Metabolic encephalopathy: Secondary | ICD-10-CM | POA: Diagnosis not present

## 2023-11-13 DIAGNOSIS — D513 Other dietary vitamin B12 deficiency anemia: Secondary | ICD-10-CM | POA: Diagnosis not present

## 2023-11-13 LAB — BASIC METABOLIC PANEL WITH GFR
Anion gap: 8 (ref 5–15)
BUN: 17 mg/dL (ref 8–23)
CO2: 26 mmol/L (ref 22–32)
Calcium: 8.7 mg/dL — ABNORMAL LOW (ref 8.9–10.3)
Chloride: 102 mmol/L (ref 98–111)
Creatinine, Ser: 0.93 mg/dL (ref 0.44–1.00)
GFR, Estimated: >60 mL/min (ref >60)
Glucose, Bld: 184 mg/dL — ABNORMAL HIGH (ref 70–99)
Potassium: 4.5 mmol/L (ref 3.5–5.1)
Sodium: 136 mmol/L (ref 135–145)

## 2023-11-13 LAB — GLUCOSE, CAPILLARY
Glucose-Capillary: 165 mg/dL — ABNORMAL HIGH (ref 70–99)
Glucose-Capillary: 165 mg/dL — ABNORMAL HIGH (ref 70–99)
Glucose-Capillary: 220 mg/dL — ABNORMAL HIGH (ref 70–99)
Glucose-Capillary: 209 mg/dL — ABNORMAL HIGH (ref 70–99)

## 2023-11-13 LAB — CBC
HCT: 27.3 % — ABNORMAL LOW (ref 36.0–46.0)
Hemoglobin: 8.7 g/dL — ABNORMAL LOW (ref 12.0–15.0)
MCH: 25.5 pg — ABNORMAL LOW (ref 26.0–34.0)
MCHC: 31.9 g/dL (ref 30.0–36.0)
MCV: 80.1 fL (ref 80.0–100.0)
Platelets: 271 10*3/uL (ref 150–400)
RBC: 3.41 MIL/uL — ABNORMAL LOW (ref 3.87–5.11)
RDW: 17.3 % — ABNORMAL HIGH (ref 11.5–15.5)
WBC: 8.5 10*3/uL (ref 4.0–10.5)
nRBC: 0 % (ref 0.0–0.2)

## 2023-11-13 MED ORDER — OXYCODONE-ACETAMINOPHEN 5-325 MG PO TABS: 1.0000 | ORAL_TABLET | Freq: Four times a day (QID) | ORAL | 0 refills | Status: DC | PRN

## 2023-11-13 NOTE — Progress Notes (Signed)
 Subjective: 3 Days Post-Op Procedure(s) (LRB): OPEN REDUCTION INTERNAL FIXATION (ORIF) RADIAL FRACTURE (Left) Patient reports pain as mild in the left wrist this morning.  Sleeping when entering the room.  Patient is well, and has had no acute complaints or problems  Plan is to go Rehab after hospital stay. Denies any N/V this AM. No recent fevers.  Objective: Vital signs in last 24 hours: Temp:  [98 F (36.7 C)-98.9 F (37.2 C)] 98 F (36.7 C) (04/06 0504) Pulse Rate:  [83-90] 87 (04/06 0504) Resp:  [16-18] 18 (04/06 0504) BP: (129-175)/(52-70) 129/52 (04/06 0504) SpO2:  [91 %-95 %] 93 % (04/06 0504)  Intake/Output from previous day:  Intake/Output Summary (Last 24 hours) at 11/13/2023 0737 Last data filed at 11/12/2023 2305 Gross per 24 hour  Intake 240 ml  Output --  Net 240 ml    Intake/Output this shift: No intake/output data recorded.  Labs: Recent Labs    11/11/23 0450 11/13/23 0419  HGB 7.6* 8.7*   Recent Labs    11/11/23 0450 11/13/23 0419  WBC 7.7 8.5  RBC 3.00* 3.41*  HCT 23.6* 27.3*  PLT 293 271   Recent Labs    11/11/23 0450 11/13/23 0419  NA 133* 136  K 4.0 4.5  CL 103 102  CO2 24 26  BUN 16 17  CREATININE 0.84 0.93  GLUCOSE 166* 184*  CALCIUM 8.1* 8.7*   No results for input(s): "LABPT", "INR" in the last 72 hours.  EXAM General - Patient is Alert, Appropriate, and Oriented Extremity - Neurologically intact Neurovascular intact Sensation intact distally Intact pulses distally No cellulitis present Compartment soft Dressing - Short arm splint intact to the left arm.  No drainage noted. Swelling improving to the fingers.  She is able to flex and extend fingers without pain.   Intact to light touch over the fingers.  Cap refill intact.  Past Medical History:  Diagnosis Date   Acute cystitis 05/23/2018   Acute deep vein thrombosis (DVT) of right tibial vein (HCC) 04/03/2020   Anxiety    Arthritis    Bronchitis 06/17/2015    Cellulitis 12/13/2018   Chickenpox    COVID-19 virus infection 06/24/2022   Diabetes mellitus without complication (HCC)    Dizziness 01/15/2022   Fall 12/10/2022   GERD (gastroesophageal reflux disease)    Hypertension    Laceration of skin and cutaneous sensory nerve of left upper extremity 04/13/2022   Osteoporosis    Type 2 diabetes mellitus (HCC)    Urinary tract infection     Assessment/Plan: 3 Days Post-Op Procedure(s) (LRB): OPEN REDUCTION INTERNAL FIXATION (ORIF) RADIAL FRACTURE (Left) Principal Problem:   Acute metabolic encephalopathy Active Problems:   Type 2 diabetes mellitus with other specified complication (HCC)   Essential hypertension   Anxiety and depression   SDH (subdural hematoma) (HCC)   Microcytic anemia   HLD (hyperlipidemia)   Nausea vomiting and diarrhea   Left wrist fracture   B12 deficiency anemia  Estimated body mass index is 19.97 kg/m as calculated from the following:   Height as of this encounter: 5\' 5"  (1.651 m).   Weight as of this encounter: 54.4 kg. Advance diet  Labs and vitals reviewed. No recent fevers. Short arm splint intact without drainage. Continue to work with PT.  Following discharge, follow-up with [facility] in 10-14 days for splint removal, suture removal and x-rays of the left wrist. NWB to the left wrist.  Valeria Batman, PA-C, CAQ-OS [facility] 11/13/2023,  7:37 AM

## 2023-11-13 NOTE — Progress Notes (Addendum)
 Physical Therapy Treatment Patient Details Name: Morgan Bowen MRN: [id number] DOB: 05/14/1936 Today's Date: 11/13/2023   History of Present Illness Pt is an 88 year old female presenting with AMS, slurred speech, R facial droop; MRI showing Bilateral chronic subdural hematomas, measuring up to 4 mm, Old left cerebellar and left basal ganglia small vessel infarcts.  S/p ORIF L distal radius fx 11/10/23.  PMH significant for  HTN, HLD, DM, depression with anxiety, anemia, DVT not on anticoagulants, recent L wrist fracture    PT Comments  To EOB with mod a x 1.  Steady in sitting.  She is able stand to [facility] but unable to void with time.  Denies feeling that she needs to go.  She progresses gait to 104' with HHA +1 with min a x 1.  Overall progressing well but limited by general fatigue.  No dizziness noted today.    If plan is discharge home, recommend the following: A lot of help with walking and/or transfers;A lot of help with bathing/dressing/bathroom;Assist for transportation;Help with stairs or ramp for entrance   Can travel by private vehicle        Equipment Recommendations       Recommendations for Other Services       Precautions / Restrictions Precautions Precautions: Fall Recall of Precautions/Restrictions: Intact Required Braces or Orthoses: Splint/Cast Splint/Cast: L UE Restrictions Weight Bearing Restrictions Per Provider Order: Yes LUE Weight Bearing Per Provider Order: Non weight bearing Other Position/Activity Restrictions: NWB through L wrist/hand; may use platform walker if necessary     Mobility  Bed Mobility Overal bed mobility: Needs Assistance Bed Mobility: Supine to Sit     Supine to sit: Mod assist       Patient Response: Cooperative  Transfers Overall transfer level: Needs assistance Equipment used: 1 person hand held assist Transfers: Sit to/from Stand Sit to Stand: Min assist, Mod assist                Ambulation/Gait Ambulation/Gait  assistance: Min Chemical engineer (Feet): 40 Feet Assistive device: 1 person hand held assist Gait Pattern/deviations: Step-through pattern Gait velocity: dec     General Gait Details: short walk in hallway with HHA +1.  generaly unsteady but overall does fair and no dizziness today   Stairs             Wheelchair Mobility     Tilt Bed Tilt Bed Patient Response: Cooperative  Modified Rankin (Stroke Patients Only)       Balance Overall balance assessment: Needs assistance Sitting-balance support: Single extremity supported, Feet supported Sitting balance-Leahy Scale: Good     Standing balance support: Single extremity supported Standing balance-Leahy Scale: Poor Standing balance comment: +1 hands on at all times for mobility                            Communication Communication Communication: No apparent difficulties  Cognition Arousal: Alert Behavior During Therapy: WFL for tasks assessed/performed   PT - Cognitive impairments: History of cognitive impairments                         Following commands: Impaired Following commands impaired: Follows one step commands inconsistently    Cueing Cueing Techniques: Verbal cues, Gestural cues, Tactile cues, Visual cues  Exercises Other Exercises Other Exercises: to  [facility] in attempt to void but unable at this time    General Comments  Pertinent Vitals/Pain Pain Assessment Pain Assessment: Faces Faces Pain Scale: Hurts a little bit Pain Location: back Pain Descriptors / Indicators: Aching, Discomfort, Sore Pain Intervention(s): Limited activity within patient's tolerance, Monitored during session, Premedicated before session    Home Living                          Prior Function            PT Goals (current goals can now be found in the care plan section) Progress towards PT goals: Progressing toward goals    Frequency    Min 2X/week      PT Plan       Co-evaluation              AM-PAC PT "6 Clicks" Mobility   Outcome Measure  Help needed turning from your back to your side while in a flat bed without using bedrails?: A Little Help needed moving from lying on your back to sitting on the side of a flat bed without using bedrails?: A Lot Help needed moving to and from a bed to a chair (including a wheelchair)?: A Little Help needed standing up from a chair using your arms (e.g., wheelchair or bedside chair)?: A Little Help needed to walk in hospital room?: A Little Help needed climbing 3-5 steps with a railing? : A Lot 6 Click Score: 16    End of Session Equipment Utilized During Treatment: Gait belt Activity Tolerance: Patient tolerated treatment well Patient left: in chair;with chair alarm set;with call bell/phone within reach Nurse Communication: Mobility status PT Visit Diagnosis: Unsteadiness on feet (R26.81);Muscle weakness (generalized) (M62.81);History of falling (Z91.81);Difficulty in walking, not elsewhere classified (R26.2);Other symptoms and signs involving the nervous system (R29.898)     Time: 4098-1191 PT Time Calculation (min) (ACUTE ONLY): 21 min  Charges:    $Gait Training: 8-22 mins PT General Charges $$ ACUTE PT VISIT: 1 Visit                   Danielle Dess, PTA 11/13/23, 12:34 PM

## 2023-11-13 NOTE — Plan of Care (Signed)

## 2023-11-13 NOTE — Plan of Care (Signed)

## 2023-11-13 NOTE — Progress Notes (Signed)
 Progress Note   Patient: Morgan Bowen ZOX:[id number] DOB: 02-07-1936 DOA: 11/07/2023     5 DOS: the patient was seen and examined on 11/13/2023   Brief hospital course: Patient is a 88 year old female  with medical history significant of HTN, HLD, DM, depression with anxiety, anemia, DVT not on anticoagulants, recent left wrist fracture, who presents with AMS, slurred speech, right facial droop.  Per patient son, symptoms started after taking tramadol. Seen by neurology, MRI scan did not show any stroke.  Patient is not a candidate for antiplatelet treatment due to subdural hematoma. Patient also had open reduction and internal fixation of a left distal radius fracture 4/3.   Condition has improved, currently pending nursing home placement.   Principal Problem:   Acute metabolic encephalopathy Active Problems:   SDH (subdural hematoma) (HCC)   Type 2 diabetes mellitus with other specified complication (HCC)   HLD (hyperlipidemia)   Essential hypertension   Microcytic anemia   Nausea vomiting and diarrhea   Anxiety and depression   Left wrist fracture   B12 deficiency anemia   Assessment and Plan: Acute metabolic encephalopathy with facial droop. Bilateral subdural hematoma. Old left basal ganglia and cerebellar stroke. Stroke is ruled out with MRI.  Patient has been seen by neurology, not a candidate for antiplatelet treatment due to bilateral subdural hematoma.  Patient could have had bad reaction to tramadol. Patient condition has improved today, will avoid tramadol at this time. Patient feels well today, no recurrence facial droop.  Mental status improved. Echocardiogram showed ejection fraction of 65 to 70% with diastolic dysfunction.   Acute on chronic anemia. B12 deficient anemia. Patient hemoglobin has dropped down to 7.1, patient never had any black stool or rectal bleeding.  At this point, patient will be treated with B12 injection x 7 days followed with 2000 mg daily.    Patient received a unit of PRBC on 3/4 for surgery.  Hemoglobin increased to 8.7 today.     Chronic diarrhea. Hyponatremia. Hypokalemia. Given malabsorption of B12, patient most likely has pancreatic insufficiency causing absorption and diarrhea.  Started Creon. Replete potassium.  Patient also has a tendency to drink large amount of water, start fluid restriction for hyponatremia.  Also given 2 dose of sodium chloride orally, sodium improved.  Diarrhea also improving. Electrolytes normalized.   Uncontrolled type 2 diabetes with hyperglycemia. Continue sliding scale insulin..   Left wrist fracture status post reduction, in cast.   Postsurgery.  Continue as needed oral pain medicine, discontinue morphine.   Essential hypertension. Blood pressure running high, restart ARB.            Subjective:  Patient doing much better today, pain under control.  No shortness of breath.  Physical Exam: Vitals:   11/12/23 1602 11/12/23 2052 11/13/23 0504 11/13/23 0925  BP: (!) 154/57 (!) 149/56 (!) 129/52 (!) 151/55  Pulse: 83 87 87 86  Resp: 16 18 18    Temp:  98.4 F (36.9 C) 98 F (36.7 C) 98 F (36.7 C)  TempSrc:  Oral    SpO2: 95% 91% 93% 92%  Weight:      Height:       General exam: Appears calm and comfortable  Respiratory system: Clear to auscultation. Respiratory effort normal. Cardiovascular system: S1 & S2 heard, RRR. No JVD, murmurs, rubs, gallops or clicks. No pedal edema. Gastrointestinal system: Abdomen is nondistended, soft and nontender. No organomegaly or masses felt. Normal bowel sounds heard. Central nervous system: Alert and oriented. No  focal neurological deficits. Extremities: Symmetric 5 x 5 power. Skin: No rashes, lesions or ulcers Psychiatry: Judgement and insight appear normal. Mood & affect appropriate.    Data Reviewed:  Lab results reviewed.  Family Communication: None  Disposition: Status is: Inpatient Remains inpatient appropriate because:  Unsafe discharge, pending nursing home placement.     Time spent: 35 minutes  Author: [name], MD 11/13/2023 1:40 PM  For on call review [email or web address].

## 2023-11-14 DIAGNOSIS — D513 Other dietary vitamin B12 deficiency anemia: Secondary | ICD-10-CM | POA: Diagnosis not present

## 2023-11-14 DIAGNOSIS — S065XAA Traumatic subdural hemorrhage with loss of consciousness status unknown, initial encounter: Secondary | ICD-10-CM | POA: Diagnosis not present

## 2023-11-14 DIAGNOSIS — G9341 Metabolic encephalopathy: Secondary | ICD-10-CM | POA: Diagnosis not present

## 2023-11-14 DIAGNOSIS — S62102A Fracture of unspecified carpal bone, left wrist, initial encounter for closed fracture: Secondary | ICD-10-CM | POA: Diagnosis not present

## 2023-11-14 LAB — GLUCOSE, CAPILLARY
Glucose-Capillary: 172 mg/dL — ABNORMAL HIGH (ref 70–99)
Glucose-Capillary: 161 mg/dL — ABNORMAL HIGH (ref 70–99)
Glucose-Capillary: 155 mg/dL — ABNORMAL HIGH (ref 70–99)
Glucose-Capillary: 168 mg/dL — ABNORMAL HIGH (ref 70–99)

## 2023-11-14 NOTE — TOC Progression Note (Signed)
 Transition of Care [facility]) - Progression Note    Patient Details  Name: Morgan Bowen MRN: [id number] Date of Birth: [birth year]  [facility]) CM/SW Contact  Cherre Blanc, RN Phone Number: 11/14/2023, 11:48 AM  Clinical Narrative:     TOC spoke with the patient's son, Leonette Most [phone], and shared the patient has a bed offer at [facility] in [facility]. [place] requested she be sent to the other facilities. TOC sent patient out in [place]  TOC will continue to follow   Expected Discharge Plan: Skilled Nursing Facility Barriers to Discharge: Continued Medical Work up, SNF Pending bed offer  Expected Discharge Plan and Services   Discharge Planning Services: CM Consult   Living arrangements for the past 2 months: Single Family Home                                       Social Determinants of Health (SDOH) Interventions SDOH Screenings   Food Insecurity: No Food Insecurity (11/08/2023)  Housing: Low Risk  (11/08/2023)  Transportation Needs: No Transportation Needs (11/08/2023)  Utilities: Not At Risk (11/08/2023)  Alcohol Screen: Low Risk  (04/27/2023)  Depression (PHQ2-9): Low Risk  (05/11/2023)  Financial Resource Strain: Low Risk  (04/27/2023)  Physical Activity: Inactive (04/27/2023)  Social Connections: Socially Isolated (11/08/2023)  Stress: No Stress Concern Present (04/27/2023)  Tobacco Use: Low Risk  (11/10/2023)  Health Literacy: Adequate Health Literacy (04/27/2023)    Readmission Risk Interventions     No data to display

## 2023-11-14 NOTE — Progress Notes (Signed)
 Progress Note   Patient: Morgan Bowen:[id number] DOB: [birth year] DOA: 11/07/2023     6 DOS: the patient was seen and examined on 11/14/2023   Brief hospital course: Patient is a 88 year old female  with medical history significant of HTN, HLD, DM, depression with anxiety, anemia, DVT not on anticoagulants, recent left wrist fracture, who presents with AMS, slurred speech, right facial droop.  Per patient son, symptoms started after taking tramadol. Seen by neurology, MRI scan did not show any stroke.  Patient is not a candidate for antiplatelet treatment due to subdural hematoma. Patient also had open reduction and internal fixation of a left distal radius fracture 4/3.   Condition has improved, currently pending nursing home placement.   Principal Problem:   Acute metabolic encephalopathy Active Problems:   SDH (subdural hematoma) (HCC)   Type 2 diabetes mellitus with other specified complication (HCC)   HLD (hyperlipidemia)   Essential hypertension   Microcytic anemia   Nausea vomiting and diarrhea   Anxiety and depression   Left wrist fracture   B12 deficiency anemia   Assessment and Plan: Acute metabolic encephalopathy with facial droop. Bilateral subdural hematoma. Old left basal ganglia and cerebellar stroke. Stroke is ruled out with MRI.  Patient has been seen by neurology, not a candidate for antiplatelet treatment due to bilateral subdural hematoma.  Patient could have had bad reaction to tramadol. Patient condition has improved today, will avoid tramadol at this time. Patient feels well today, no recurrence facial droop.  Mental status improved. Echocardiogram showed ejection fraction of 65 to 70% with diastolic dysfunction. Patient mental status is back to baseline, currently pending nursing home placement.   Acute on chronic anemia. B12 deficient anemia. Patient hemoglobin has dropped down to 7.1, patient never had any black stool or rectal bleeding.  At this  point, patient will be treated with B12 injection x 7 days followed with 2000 mg daily.   Patient received a unit of PRBC on 3/4 for surgery.  Hemoglobin has been improved   Chronic diarrhea. Hyponatremia. Hypokalemia. Given malabsorption of B12, patient most likely has pancreatic insufficiency causing absorption and diarrhea.  Started Creon. Replete potassium.  Patient also has a tendency to drink large amount of water, start fluid restriction for hyponatremia.  Also given 2 dose of sodium chloride orally, sodium improved.  Diarrhea also improving. Electrolytes normalized.   Uncontrolled type 2 diabetes with hyperglycemia. Continue sliding scale insulin..   Left wrist fracture status post reduction, in cast.   Postsurgery.  Continue as needed oral pain medicine, discontinue morphine.   Essential hypertension. Blood pressure running high, restarted ARB.         Subjective:  Patient doing well today, left arm pain under control.  No shortness of breath.  No confusion.  Physical Exam: Vitals:   11/13/23 1815 11/13/23 2101 11/14/23 0558 11/14/23 0847  BP: (!) 196/88 (!) 149/65 (!) 124/47 (!) 148/60  Pulse: 84 (!) 101 77 82  Resp:    16  Temp: (!) 97.4 F (36.3 C) 98.2 F (36.8 C) (!) 97.5 F (36.4 C) 98.1 F (36.7 C)  TempSrc:  Oral Oral   SpO2: 93% 95% 94% 93%  Weight:      Height:       General exam: Appears calm and comfortable  Respiratory system: Clear to auscultation. Respiratory effort normal. Cardiovascular system: S1 & S2 heard, RRR. No JVD, murmurs, rubs, gallops or clicks. No pedal edema. Gastrointestinal system: Abdomen is nondistended, soft and nontender.  No organomegaly or masses felt. Normal bowel sounds heard. Central nervous system: Alert and oriented. No focal neurological deficits. Extremities: Symmetric 5 x 5 power. Skin: No rashes, lesions or ulcers Psychiatry: Judgement and insight appear normal. Mood & affect appropriate.    Data  Reviewed:  There are no new results to review at this time.  Family Communication: Son Updated at bedside.  Disposition: Status is: Inpatient Remains inpatient appropriate because: Unsafe discharge option, pending nursing home placement.     Time spent: 35 minutes  Author: [name], MD 11/14/2023 1:13 PM  For on call review [email or web address].

## 2023-11-14 NOTE — Progress Notes (Signed)
 Subjective: 4 Days Post-Op Procedure(s) (LRB): OPEN REDUCTION INTERNAL FIXATION (ORIF) RADIAL FRACTURE (Left) Patient reports pain as mild in the left wrist this morning.   Patient is well, and has had no acute complaints or problems  Plan is to go Rehab after hospital stay. Pending placement Admits some nausea, denies any vomiting. No recent fevers or chills. No CP or SOB.  Objective: Vital signs in last 24 hours: Temp:  [97.4 F (36.3 C)-98.9 F (37.2 C)] 98.1 F (36.7 C) (04/07 0847) Pulse Rate:  [77-101] 82 (04/07 0847) Resp:  [16] 16 (04/07 0847) BP: (124-196)/(47-88) 148/60 (04/07 0847) SpO2:  [93 %-95 %] 93 % (04/07 0847)  Intake/Output from previous day: No intake or output data in the 24 hours ending 11/14/23 1459   Intake/Output this shift: No intake/output data recorded.  Labs: Recent Labs    11/13/23 0419  HGB 8.7*   Recent Labs    11/13/23 0419  WBC 8.5  RBC 3.41*  HCT 27.3*  PLT 271   Recent Labs    11/13/23 0419  NA 136  K 4.5  CL 102  CO2 26  BUN 17  CREATININE 0.93  GLUCOSE 184*  CALCIUM 8.7*   No results for input(s): "LABPT", "INR" in the last 72 hours.  EXAM General - Patient is Alert, Appropriate, and Oriented Extremity - Neurologically intact Neurovascular intact Sensation intact distally Intact pulses distally No cellulitis present Compartment soft Dressing - Short arm splint intact to the left arm.  No drainage noted. Swelling improving to the fingers.  She is able to flex and extend fingers without pain.   Intact to light touch over the fingers.  Cap refill less than 3 seconds in each finger.  Past Medical History:  Diagnosis Date   Acute cystitis 05/23/2018   Acute deep vein thrombosis (DVT) of right tibial vein (HCC) 04/03/2020   Anxiety    Arthritis    Bronchitis 06/17/2015   Cellulitis 12/13/2018   Chickenpox    COVID-19 virus infection 06/24/2022   Diabetes mellitus without complication (HCC)    Dizziness  01/15/2022   Fall 12/10/2022   GERD (gastroesophageal reflux disease)    Hypertension    Laceration of skin and cutaneous sensory nerve of left upper extremity 04/13/2022   Osteoporosis    Type 2 diabetes mellitus (HCC)    Urinary tract infection     Assessment/Plan: 4 Days Post-Op Procedure(s) (LRB): OPEN REDUCTION INTERNAL FIXATION (ORIF) RADIAL FRACTURE (Left) Principal Problem:   Acute metabolic encephalopathy Active Problems:   Type 2 diabetes mellitus with other specified complication (HCC)   Essential hypertension   Anxiety and depression   SDH (subdural hematoma) (HCC)   Microcytic anemia   HLD (hyperlipidemia)   Nausea vomiting and diarrhea   Left wrist fracture   B12 deficiency anemia  Estimated body mass index is 19.97 kg/m as calculated from the following:   Height as of this encounter: 5\' 5"  (1.651 m).   Weight as of this encounter: 54.4 kg. Advance diet  Labs and vitals reviewed, stable Short arm splint intact without drainage. Continue to work with PT.  Following discharge, follow-up with [facility] in 10-14 days for splint removal, suture removal and x-rays of the left wrist. NWB to the left wrist.  Danise Edge, PA-C [facility] 11/14/2023, 2:59 PM

## 2023-11-15 DIAGNOSIS — S62102A Fracture of unspecified carpal bone, left wrist, initial encounter for closed fracture: Secondary | ICD-10-CM | POA: Diagnosis not present

## 2023-11-15 DIAGNOSIS — G9341 Metabolic encephalopathy: Secondary | ICD-10-CM | POA: Diagnosis not present

## 2023-11-15 DIAGNOSIS — S065XAA Traumatic subdural hemorrhage with loss of consciousness status unknown, initial encounter: Secondary | ICD-10-CM | POA: Diagnosis not present

## 2023-11-15 DIAGNOSIS — D513 Other dietary vitamin B12 deficiency anemia: Secondary | ICD-10-CM | POA: Diagnosis not present

## 2023-11-15 LAB — GLUCOSE, CAPILLARY
Glucose-Capillary: 152 mg/dL — ABNORMAL HIGH (ref 70–99)
Glucose-Capillary: 245 mg/dL — ABNORMAL HIGH (ref 70–99)
Glucose-Capillary: 266 mg/dL — ABNORMAL HIGH (ref 70–99)
Glucose-Capillary: 297 mg/dL — ABNORMAL HIGH (ref 70–99)

## 2023-11-15 MED ORDER — OXYCODONE-ACETAMINOPHEN 5-325 MG PO TABS: 1.0000 | ORAL_TABLET | Freq: Four times a day (QID) | ORAL | 0 refills | Status: DC | PRN

## 2023-11-15 MED ORDER — SENNOSIDES-DOCUSATE SODIUM 8.6-50 MG PO TABS
2.0000 | ORAL_TABLET | Freq: Two times a day (BID) | ORAL | Status: DC
  Administered 2023-11-15 – 2023-11-16 (×3): 2 via ORAL
  Filled 2023-11-15 (×3): qty 2

## 2023-11-15 MED ORDER — VITAMIN B-12 1000 MCG PO TABS
2000.0000 ug | ORAL_TABLET | Freq: Every day | ORAL | Status: DC
  Administered 2023-11-16: 2000 ug via ORAL
  Filled 2023-11-15: qty 2

## 2023-11-15 NOTE — TOC Progression Note (Signed)
 Transition of Care [facility]) - Progression Note    Patient Details  Name: Morgan Bowen MRN: [id number] Date of Birth: 1936/06/14  [facility]) CM/SW Contact  Cherre Blanc, RN Phone Number: 11/15/2023, 4:20 PM  Clinical Narrative:     TOC received call from Admissions Coordinator at [facility] in [place] [phone] with a bed offer for tomorrow.  TOC will continue to follow.   TOC sent message to the patient's son Leonette Most [phone] with bed offer.  TOC faxed [phone] clinical information and request form to [employer] to request ins auth  Expected Discharge Plan: Skilled Nursing Facility Barriers to Discharge: Continued Medical Work up, SNF Pending bed offer  Expected Discharge Plan and Services   Discharge Planning Services: CM Consult   Living arrangements for the past 2 months: Single Family Home                                       Social Determinants of Health (SDOH) Interventions SDOH Screenings   Food Insecurity: No Food Insecurity (11/08/2023)  Housing: Low Risk  (11/08/2023)  Transportation Needs: No Transportation Needs (11/08/2023)  Utilities: Not At Risk (11/08/2023)  Alcohol Screen: Low Risk  (04/27/2023)  Depression (PHQ2-9): Low Risk  (05/11/2023)  Financial Resource Strain: Low Risk  (04/27/2023)  Physical Activity: Inactive (04/27/2023)  Social Connections: Socially Isolated (11/08/2023)  Stress: No Stress Concern Present (04/27/2023)  Tobacco Use: Low Risk  (11/10/2023)  Health Literacy: Adequate Health Literacy (04/27/2023)    Readmission Risk Interventions     No data to display

## 2023-11-15 NOTE — Progress Notes (Signed)
 Progress Note   Patient: Morgan Bowen:[id number] DOB: 08-Jul-1936 DOA: 11/07/2023     7 DOS: the patient was seen and examined on 11/15/2023   Brief hospital course: Patient is a 88 year old female  with medical history significant of HTN, HLD, DM, depression with anxiety, anemia, DVT not on anticoagulants, recent left wrist fracture, who presents with AMS, slurred speech, right facial droop.  Per patient son, symptoms started after taking tramadol. Seen by neurology, MRI scan did not show any stroke.  Patient is not a candidate for antiplatelet treatment due to subdural hematoma. Patient also had open reduction and internal fixation of a left distal radius fracture 4/3.   Condition has improved, currently pending nursing home placement.   Principal Problem:   Acute metabolic encephalopathy Active Problems:   SDH (subdural hematoma) (HCC)   Type 2 diabetes mellitus with other specified complication (HCC)   HLD (hyperlipidemia)   Essential hypertension   Microcytic anemia   Nausea vomiting and diarrhea   Anxiety and depression   Left wrist fracture   B12 deficiency anemia   Assessment and Plan: Acute metabolic encephalopathy with facial droop. Bilateral subdural hematoma. Old left basal ganglia and cerebellar stroke. Stroke is ruled out with MRI.  Patient has been seen by neurology, not a candidate for antiplatelet treatment due to bilateral subdural hematoma.  Patient could have had bad reaction to tramadol. Patient condition has improved today, will avoid tramadol at this time. Patient feels well, no recurrence facial droop.  Mental status improved. Echocardiogram showed ejection fraction of 65 to 70% with diastolic dysfunction. Patient mental status is back to baseline, currently pending nursing home placement.   Acute on chronic anemia. B12 deficient anemia. Patient hemoglobin has dropped down to 7.1, patient never had any black stool or rectal bleeding.  Neurology  recommended treated with B12 injection x 7 days followed with 2000 mg daily.   Patient received a unit of PRBC on 3/4 for surgery.  Hemoglobin has been improved.  Completed 7 days of injectable B12, changed to oral B12 by tomorrow.   Chronic diarrhea. Hyponatremia. Hypokalemia. Given malabsorption of B12, patient most likely has pancreatic insufficiency causing absorption and diarrhea.  Started Creon. Replete potassium.  Patient also has a tendency to drink large amount of water, start fluid restriction for hyponatremia.  Also given 2 dose of sodium chloride orally, sodium improved.  Diarrhea also improved.  Patient developed some constipation, start senna. Electrolytes normalized.   Uncontrolled type 2 diabetes with hyperglycemia. Continue sliding scale insulin..   Left wrist fracture status post reduction, in cast.   Postsurgery.  Continue as needed oral pain medicine, discontinue morphine.   Essential hypertension. Blood pressure running high, restarted ARB.         Subjective:  Patient doing well today, some left arm pain which is controlled with pain medicine.  No nausea vomiting abdominal pain.  No diarrhea.  Physical Exam: Vitals:   11/14/23 1504 11/14/23 2040 11/15/23 0431 11/15/23 0805  BP: (!) 152/54 (!) 160/60 (!) 133/56 (!) 154/57  Pulse: 83 86 85 87  Resp: 18 18 18 17   Temp: (!) 97.5 F (36.4 C) 98.2 F (36.8 C) 98.2 F (36.8 C)   TempSrc: Oral     SpO2: 100% 93% 97% 92%  Weight:      Height:       General exam: Appears calm and comfortable  Respiratory system: Clear to auscultation. Respiratory effort normal. Cardiovascular system: S1 & S2 heard, RRR. No JVD,  murmurs, rubs, gallops or clicks. No pedal edema. Gastrointestinal system: Abdomen is nondistended, soft and nontender. No organomegaly or masses felt. Normal bowel sounds heard. Central nervous system: Alert and oriented x3. No focal neurological deficits. Extremities: Symmetric 5 x 5 power. Skin:  No rashes, lesions or ulcers Psychiatry: Mood & affect appropriate.    Data Reviewed:  There are no new results to review at this time.  Family Communication: Son updated at the bedside.  Disposition: Status is: Inpatient Remains inpatient appropriate because: Unsafe discharge, pending SNF placement.     Time spent: 35 minutes  Author: [name], MD 11/15/2023 11:54 AM  For on call review [email or web address].

## 2023-11-15 NOTE — Progress Notes (Signed)
 Subjective: 5 Days Post-Op Procedure(s) (LRB): OPEN REDUCTION INTERNAL FIXATION (ORIF) RADIAL FRACTURE (Left) Patient reports pain as mild in the left wrist this morning.  Sleeping well this AM. Patient is well, and has had no acute complaints or problems  Plan is to go Rehab after hospital stay. Pending placement Admits some nausea, denies any vomiting. No recent fevers or chills. No CP or SOB.  Objective: Vital signs in last 24 hours: Temp:  [97.5 F (36.4 C)-98.2 F (36.8 C)] 98.2 F (36.8 C) (04/08 0431) Pulse Rate:  [82-86] 85 (04/08 0431) Resp:  [16-18] 18 (04/08 0431) BP: (133-160)/(54-60) 133/56 (04/08 0431) SpO2:  [93 %-100 %] 97 % (04/08 0431)  Intake/Output from previous day: No intake or output data in the 24 hours ending 11/15/23 0724   Intake/Output this shift: No intake/output data recorded.  Labs: Recent Labs    11/13/23 0419  HGB 8.7*   Recent Labs    11/13/23 0419  WBC 8.5  RBC 3.41*  HCT 27.3*  PLT 271   Recent Labs    11/13/23 0419  NA 136  K 4.5  CL 102  CO2 26  BUN 17  CREATININE 0.93  GLUCOSE 184*  CALCIUM 8.7*   No results for input(s): "LABPT", "INR" in the last 72 hours.  EXAM General - Patient is Alert, Appropriate, and Oriented Extremity - Neurologically intact Neurovascular intact Sensation intact distally Intact pulses distally No cellulitis present Compartment soft Dressing - Short arm splint intact to the left arm.  No drainage noted. Swelling improving to the fingers.  She is able to flex and extend fingers without pain.   Intact to light touch over the fingers.  Cap refill less than 3 seconds in each finger.  Past Medical History:  Diagnosis Date   Acute cystitis 05/23/2018   Acute deep vein thrombosis (DVT) of right tibial vein (HCC) 04/03/2020   Anxiety    Arthritis    Bronchitis 06/17/2015   Cellulitis 12/13/2018   Chickenpox    COVID-19 virus infection 06/24/2022   Diabetes mellitus without complication  (HCC)    Dizziness 01/15/2022   Fall 12/10/2022   GERD (gastroesophageal reflux disease)    Hypertension    Laceration of skin and cutaneous sensory nerve of left upper extremity 04/13/2022   Osteoporosis    Type 2 diabetes mellitus (HCC)    Urinary tract infection     Assessment/Plan: 5 Days Post-Op Procedure(s) (LRB): OPEN REDUCTION INTERNAL FIXATION (ORIF) RADIAL FRACTURE (Left) Principal Problem:   Acute metabolic encephalopathy Active Problems:   Type 2 diabetes mellitus with other specified complication (HCC)   Essential hypertension   Anxiety and depression   SDH (subdural hematoma) (HCC)   Microcytic anemia   HLD (hyperlipidemia)   Nausea vomiting and diarrhea   Left wrist fracture   B12 deficiency anemia  Estimated body mass index is 19.97 kg/m as calculated from the following:   Height as of this encounter: 5\' 5"  (1.651 m).   Weight as of this encounter: 54.4 kg. Advance diet  Vitals reviewed this AM. Short arm splint intact without drainage. Continue to work with PT.  Following discharge, follow-up with [facility] in 10-14 days for splint removal, suture removal and x-rays of the left wrist. Ortho will sign off at this time. NWB to the left wrist.  Valeria Batman, PA-C [facility] 11/15/2023, 7:24 AM

## 2023-11-15 NOTE — Evaluation (Signed)
 Occupational Therapy Re-Evaluation Patient Details Name: Morgan Bowen MRN: [id number] DOB: 10-31-1935 Today's Date: 11/15/2023   History of Present Illness   Pt is an 88 year old female presenting with AMS, slurred speech, R facial droop; MRI showing Bilateral chronic subdural hematomas, measuring up to 4 mm, Old left cerebellar and left basal ganglia small vessel infarcts.  S/p ORIF L distal radius fx 11/10/23.  PMH significant for  HTN, HLD, DM, depression with anxiety, anemia, DVT not on anticoagulants, recent L wrist fracture with L ORIF on 11/11/23.      Clinical Impressions Pt seen today for OT re-evaluation with son present. Pt pleasant and RN provides pain meds start of session due to 8/1 LBP reported. Performs bed mobility with MIN A, t/f bathroom with MIN A +1 HHA, cues to maintain NWB through LUE during toilet transfer, and MAX A for toileting hygiene + doffing/donning brief. Stands at sink for grooming/oral care with MIN A to open containers. Pt is a risk for falls with instability noted while mobilizing. Left in bed with BUE elevated and heels floated. Requires cues to adhere to NWB status during functional tasks. Discharge recommendation appropriate. Patient will benefit from continued inpatient follow up therapy, <3 hours/day      If plan is discharge home, recommend the following:   A lot of help with walking and/or transfers;A lot of help with bathing/dressing/bathroom;Supervision due to cognitive status     Functional Status Assessment   Patient has had a recent decline in their functional status and demonstrates the ability to make significant improvements in function in a reasonable and predictable amount of time.     Equipment Recommendations   Other (comment)      Precautions/Restrictions   Precautions Precautions: Fall Recall of Precautions/Restrictions: Impaired Required Braces or Orthoses: Splint/Cast Splint/Cast: L UE Restrictions Weight Bearing  Restrictions Per Provider Order: Yes LUE Weight Bearing Per Provider Order: Non weight bearing Other Position/Activity Restrictions: NWB through L wrist/hand; may use platform walker if necessary     Mobility Bed Mobility Overal bed mobility: Needs Assistance Bed Mobility: Supine to Sit     Supine to sit: Min assist, HOB elevated Sit to supine: Min assist   General bed mobility comments: assist for trunk; vc's for technique    Transfers Overall transfer level: Needs assistance Equipment used: 1 person hand held assist Transfers: Sit to/from Stand Sit to Stand: Min assist           General transfer comment: min vs for precautions on LUE      Balance Overall balance assessment: Needs assistance Sitting-balance support: Single extremity supported, Feet supported Sitting balance-Leahy Scale: Good Sitting balance - Comments: steady reaching within BOS   Standing balance support: Single extremity supported Standing balance-Leahy Scale: Fair                             ADL either performed or assessed with clinical judgement   ADL Overall ADL's : Needs assistance/impaired     Grooming: Oral care;Minimal assistance;Standing Grooming Details (indicate cue type and reason): assist to open items for oral care             Lower Body Dressing: Maximal assistance Lower Body Dressing Details (indicate cue type and reason): donn/doff brief Toilet Transfer: Minimal assistance Toilet Transfer Details (indicate cue type and reason): +1 HHA t/f bathroom, cues to not use grab bars on LUE Toileting- Clothing Manipulation and Hygiene: Maximal assistance  Functional mobility during ADLs: Minimal assistance        Pertinent Vitals/Pain Pain Assessment Pain Assessment: 0-10 Pain Score: 8  Pain Location: L wrist and LBP Pain Descriptors / Indicators: Aching, Discomfort, Sore Pain Intervention(s): Limited activity within patient's tolerance, Monitored  during session, Patient requesting pain meds-RN notified, RN gave pain meds during session     Extremity/Trunk Assessment Upper Extremity Assessment LUE Deficits / Details: s/p L UE sx (immobilized)           Communication Communication Communication: No apparent difficulties   Cognition Arousal: Alert Behavior During Therapy: WFL for tasks assessed/performed Cognition: Cognition impaired     Awareness: Intellectual awareness impaired, Online awareness impaired Memory impairment (select all impairments): Short-term memory, Working [employer] fast streamer, Non-declarative long-term memory, Geneticist, molecular long-term memory Attention impairment (select first level of impairment): Focused attention Executive functioning impairment (select all impairments): Initiation, Organization, Reasoning, Sequencing, Problem solving OT - Cognition Comments: improved cognition, requires cues for NWB on LUE                 Following commands: Intact Following commands impaired: Follows one step commands with increased time     Cueing  General Comments   Cueing Techniques: Verbal cues;Tactile cues  LUE in splint, RUE bruised   Exercises Exercises: Other exercises Other Exercises Other Exercises: edu pt and son on STR recommendations and therapy expectations Other Exercises: discussed falls prevention strategies in home environment with recommended assist at home for IADLs following STR dc   Shoulder Instructions             OT Problem List: Decreased strength;Impaired balance (sitting and/or standing);Decreased cognition;Decreased knowledge of precautions;Decreased range of motion;Decreased safety awareness;Decreased activity tolerance;Decreased knowledge of use of DME or AE;Impaired UE functional use;Pain   OT Treatment/Interventions: Self-care/ADL training;Therapeutic exercise;Patient/family education;Balance training;Energy conservation;Therapeutic activities;DME and/or AE instruction;Cognitive  remediation/compensation      OT Goals(Current goals can be found in the care plan section)   Acute Rehab OT Goals OT Goal Formulation: With patient/family Time For Goal Achievement: 11/29/23 Potential to Achieve Goals: Good   OT Frequency:  Min 2X/week       AM-PAC OT "6 Clicks" Daily Activity     Outcome Measure Help from another person eating meals?: A Lot Help from another person taking care of personal grooming?: A Lot Help from another person toileting, which includes using toliet, bedpan, or urinal?: Total Help from another person bathing (including washing, rinsing, drying)?: A Lot Help from another person to put on and taking off regular upper body clothing?: A Lot Help from another person to put on and taking off regular lower body clothing?: A Lot 6 Click Score: 11   End of Session Equipment Utilized During Treatment: Gait belt Nurse Communication: Mobility status  Activity Tolerance: Patient tolerated treatment well Patient left: in bed;with call bell/phone within reach;with bed alarm set;with family/visitor present  OT Visit Diagnosis: Other abnormalities of gait and mobility (R26.89);Muscle weakness (generalized) (M62.81);History of falling (Z91.81);Pain Pain - Right/Left: Left Pain - part of body: Hand;Arm                Time: 1540-1615 OT Time Calculation (min): 35 min Charges:  OT General Charges $OT Visit: 1 Visit OT Evaluation $OT Eval Low Complexity: 1 Low OT Treatments $Self Care/Home Management : 23-37 mins Morgan Bowen, OTR/L  11/15/23, 5:13 PM

## 2023-11-15 NOTE — TOC Progression Note (Signed)
 Transition of Care [facility]) - Progression Note    Patient Details  Name: Morgan Bowen MRN: [id number] Date of Birth: February 16, 1936  [facility]) CM/SW Contact  Cherre Blanc, RN Phone Number: 11/15/2023, 11:56 AM  Clinical Narrative:    TOC spoke with the patient's son Leonette Most [phone]  and informed him of the SNF STR bed offers: [facility] TOC informed Leonette Most that options are limited bc Tamsen Meek is not INN with many of the facilities in the area. He was not aware that his mother has [facility]. TOC advised him to contact [facility] with questions.  Charles requested that she be sent to [facility] in [place], [place] to see if the facility can extend a bed offer. TOC is waiting on a callback from admissions.  TOC will continue to follow   Expected Discharge Plan: Skilled Nursing Facility Barriers to Discharge: Continued Medical Work up, SNF Pending bed offer  Expected Discharge Plan and Services   Discharge Planning Services: CM Consult   Living arrangements for the past 2 months: Single Family Home                                       Social Determinants of Health (SDOH) Interventions SDOH Screenings   Food Insecurity: No Food Insecurity (11/08/2023)  Housing: Low Risk  (11/08/2023)  Transportation Needs: No Transportation Needs (11/08/2023)  Utilities: Not At Risk (11/08/2023)  Alcohol Screen: Low Risk  (04/27/2023)  Depression (PHQ2-9): Low Risk  (05/11/2023)  Financial Resource Strain: Low Risk  (04/27/2023)  Physical Activity: Inactive (04/27/2023)  Social Connections: Socially Isolated (11/08/2023)  Stress: No Stress Concern Present (04/27/2023)  Tobacco Use: Low Risk  (11/10/2023)  Health Literacy: Adequate Health Literacy (04/27/2023)    Readmission Risk Interventions     No data to display

## 2023-11-15 NOTE — Progress Notes (Signed)
 Physical Therapy Treatment Patient Details Name: Morgan Bowen MRN: [id number] DOB: 18-Sep-1935 Today's Date: 11/15/2023   History of Present Illness Pt is an 88 year old female presenting with AMS, slurred speech, R facial droop; MRI showing Bilateral chronic subdural hematomas, measuring up to 4 mm, Old left cerebellar and left basal ganglia small vessel infarcts.  S/p ORIF L distal radius fx 11/10/23.  PMH significant for  HTN, HLD, DM, depression with anxiety, anemia, DVT not on anticoagulants, recent L wrist fracture    PT Comments  Pt received in bed with LUE elevated. Pain moderate. Agreeable to session. Continuing to rely on cuing for maintaining NWB of LUE during bed mobility needing some assist due to not able to use LUE. Able to progress transfers and gait with personal QC needing minA for STS and CGA for gait. Fair use of QC throughout bout of 20' with very limited step through gait. Per son, reports her gait is close to baseline but still displays general unsteadiness. Pt returning to bed with all needs in reach and LUE elevated. Pt progressing well but is still a falls risk and has trouble following NWB on LUE thus d/c recs remain appropriate.      If plan is discharge home, recommend the following: A lot of help with walking and/or transfers;A lot of help with bathing/dressing/bathroom;Assist for transportation;Help with stairs or ramp for entrance   Can travel by private vehicle     Yes  Equipment Recommendations  Other (comment) (TBD)    Recommendations for Other Services       Precautions / Restrictions Precautions Precautions: Fall Recall of Precautions/Restrictions: Intact Required Braces or Orthoses: Splint/Cast Splint/Cast: L UE Restrictions Weight Bearing Restrictions Per Provider Order: Yes LUE Weight Bearing Per Provider Order: Non weight bearing Other Position/Activity Restrictions: NWB through L wrist/hand; may use platform walker if necessary     Mobility  Bed  Mobility Overal bed mobility: Needs Assistance Bed Mobility: Supine to Sit     Supine to sit: Min assist, HOB elevated     General bed mobility comments: assist for trunk; vc's for technique Patient Response: Cooperative  Transfers Overall transfer level: Needs assistance Equipment used: Quad cane Transfers: Sit to/from Stand Sit to Stand: Min assist           General transfer comment: VC's for NWB on L wrist with bed mobility. Maintains precuations with STS with RUE on quad cane    Ambulation/Gait Ambulation/Gait assistance: Contact guard assist Gait Distance (Feet): 80 Feet Assistive device: Quad cane Gait Pattern/deviations: Step-through pattern, Decreased step length - right, Decreased step length - left       General Gait Details: completes with fair sequencing of QC in RUE with LLE steps. Min VC's for keeping QC on ground instead of carrying with gait.   Stairs             Wheelchair Mobility     Tilt Bed Tilt Bed Patient Response: Cooperative  Modified Rankin (Stroke Patients Only)       Balance Overall balance assessment: Needs assistance Sitting-balance support: Single extremity supported, Feet supported Sitting balance-Leahy Scale: Good     Standing balance support: Single extremity supported Standing balance-Leahy Scale: Fair Standing balance comment: RUE on QC. Improves with time.                            Communication Communication Communication: No apparent difficulties  Cognition Arousal: Alert Behavior During  Therapy: WFL for tasks assessed/performed   PT - Cognitive impairments: History of cognitive impairments                         Following commands: Intact Following commands impaired: Follows one step commands with increased time    Cueing Cueing Techniques: Verbal cues, Tactile cues  Exercises      General Comments        Pertinent Vitals/Pain Pain Assessment Pain Assessment:  0-10 Pain Score: 5  Pain Location: L wrist Pain Intervention(s): Limited activity within patient's tolerance, Monitored during session, Repositioned    Home Living                          Prior Function            PT Goals (current goals can now be found in the care plan section) Acute Rehab PT Goals Patient Stated Goal: to improve walking PT Goal Formulation: With patient/family Time For Goal Achievement: 12/02/23 Potential to Achieve Goals: Good Additional Goals Additional Goal #1: Pt will be able to perform bed mobility/transfers with supervision and safe technique in order to improve functional independence Progress towards PT goals: Progressing toward goals    Frequency    Min 2X/week      PT Plan      Co-evaluation              AM-PAC PT "6 Clicks" Mobility   Outcome Measure  Help needed turning from your back to your side while in a flat bed without using bedrails?: A Little Help needed moving from lying on your back to sitting on the side of a flat bed without using bedrails?: A Lot Help needed moving to and from a bed to a chair (including a wheelchair)?: A Little Help needed standing up from a chair using your arms (e.g., wheelchair or bedside chair)?: A Little Help needed to walk in hospital room?: A Little Help needed climbing 3-5 steps with a railing? : A Lot 6 Click Score: 16    End of Session Equipment Utilized During Treatment: Gait belt Activity Tolerance: Patient tolerated treatment well Patient left: in bed;with call bell/phone within reach;with bed alarm set;with family/visitor present Nurse Communication: Mobility status PT Visit Diagnosis: Unsteadiness on feet (R26.81);Muscle weakness (generalized) (M62.81);History of falling (Z91.81);Difficulty in walking, not elsewhere classified (R26.2);Other symptoms and signs involving the nervous system (R29.898)     Time: 6962-9528 PT Time Calculation (min) (ACUTE ONLY): 15  min  Charges:    $Gait Training: 8-22 mins PT General Charges $$ ACUTE PT VISIT: 1 Visit                    Delphia Grates. Fairly IV, PT, DPT Physical Therapist- [place]  [facility]  11/15/2023, 3:45 PM

## 2023-11-16 DIAGNOSIS — G9341 Metabolic encephalopathy: Secondary | ICD-10-CM | POA: Diagnosis not present

## 2023-11-16 DIAGNOSIS — F419 Anxiety disorder, unspecified: Secondary | ICD-10-CM | POA: Diagnosis not present

## 2023-11-16 DIAGNOSIS — R197 Diarrhea, unspecified: Secondary | ICD-10-CM | POA: Diagnosis not present

## 2023-11-16 DIAGNOSIS — S065XAA Traumatic subdural hemorrhage with loss of consciousness status unknown, initial encounter: Secondary | ICD-10-CM | POA: Diagnosis not present

## 2023-11-16 DIAGNOSIS — E1169 Type 2 diabetes mellitus with other specified complication: Secondary | ICD-10-CM | POA: Diagnosis not present

## 2023-11-16 DIAGNOSIS — I10 Essential (primary) hypertension: Secondary | ICD-10-CM | POA: Diagnosis not present

## 2023-11-16 DIAGNOSIS — F32A Depression, unspecified: Secondary | ICD-10-CM | POA: Diagnosis not present

## 2023-11-16 DIAGNOSIS — D509 Iron deficiency anemia, unspecified: Secondary | ICD-10-CM | POA: Diagnosis not present

## 2023-11-16 DIAGNOSIS — D513 Other dietary vitamin B12 deficiency anemia: Secondary | ICD-10-CM | POA: Diagnosis not present

## 2023-11-16 DIAGNOSIS — S62102A Fracture of unspecified carpal bone, left wrist, initial encounter for closed fracture: Secondary | ICD-10-CM | POA: Diagnosis not present

## 2023-11-16 DIAGNOSIS — R112 Nausea with vomiting, unspecified: Secondary | ICD-10-CM | POA: Diagnosis not present

## 2023-11-16 LAB — GLUCOSE, CAPILLARY
Glucose-Capillary: 206 mg/dL — ABNORMAL HIGH (ref 70–99)
Glucose-Capillary: 236 mg/dL — ABNORMAL HIGH (ref 70–99)
Glucose-Capillary: 230 mg/dL — ABNORMAL HIGH (ref 70–99)

## 2023-11-16 LAB — HEMOGLOBIN: Hemoglobin: 9.2 g/dL — ABNORMAL LOW (ref 12.0–15.0)

## 2023-11-16 MED ORDER — ATORVASTATIN CALCIUM 40 MG PO TABS: 40.0000 mg | ORAL_TABLET | Freq: Every day | ORAL | 2 refills | Status: DC

## 2023-11-16 MED ORDER — CYANOCOBALAMIN 2000 MCG PO TABS: 2000.0000 ug | ORAL_TABLET | Freq: Every day | ORAL | 2 refills | Status: DC

## 2023-11-16 MED ORDER — INSULIN ASPART 100 UNIT/ML IJ SOLN
0.0000 [IU] | Freq: Every day | INTRAMUSCULAR | 11 refills | Status: DC
Start: 2023-11-16 — End: 2023-11-21

## 2023-11-16 MED ORDER — SENNOSIDES-DOCUSATE SODIUM 8.6-50 MG PO TABS: 2.0000 | ORAL_TABLET | Freq: Two times a day (BID) | ORAL | 0 refills | Status: DC

## 2023-11-16 MED ORDER — INSULIN ASPART 100 UNIT/ML IJ SOLN
0.0000 [IU] | Freq: Three times a day (TID) | INTRAMUSCULAR | 11 refills | Status: DC
Start: 2023-11-16 — End: 2023-11-22

## 2023-11-16 NOTE — Progress Notes (Signed)
 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  S/p ORIF L distal radius fx 11/10/23.  PMH significant for  HTN, HLD, DM, depression with anxiety, anemia, DVT not on anticoagulants, recent L wrist fracture   OT comments  Pt making progress towards goals. Improved performance with bed mobility, requiring CGA to transition from supine to seated EOB. Functional transfers with MIN A, mobility in room t/f bathroom and recliner with MIN A - CGA and QC. Pt limited by fear of falling, often crying out in surprise with transitions. Continues to require intermittent verbal cues to adhere to NWB LUE. Able to doff briefs over hips with MIN A, MAX A for pericare after small BM noted in brief. Discharge recommendation remains appropriate, OT will continue to progress as able. Patient will benefit from continued inpatient follow up therapy, <3 hours/day       If plan is discharge home, recommend the following:  A lot of help with walking and/or transfers;A lot of help with bathing/dressing/bathroom;Supervision due to cognitive status   Equipment Recommendations  Other (comment)    Recommendations for Other Services      Precautions / Restrictions Precautions Precautions: Fall Recall of Precautions/Restrictions: Impaired Required Braces or Orthoses: Splint/Cast Splint/Cast: L UE Restrictions Weight Bearing Restrictions Per Provider Order: Yes LUE Weight Bearing Per Provider Order: Non weight bearing Other Position/Activity Restrictions: NWB through L wrist/hand; may use platform walker if necessary       Mobility Bed Mobility Overal bed mobility: Needs Assistance Bed Mobility: Supine  to Sit     Supine to sit: HOB elevated, Contact guard     General bed mobility comments: cues for technique and NWB via LUE. better performance this date.    Transfers Overall transfer level: Needs assistance Equipment used: Quad cane Transfers: Sit to/from Stand Sit to Stand: Min assist     Step pivot transfers: Min assist           Balance Overall balance assessment: Needs assistance Sitting-balance support: Single extremity supported, Feet supported Sitting balance-Leahy Scale: Good Sitting balance - Comments: steady reaching within BOS, able to don brief without LOB   Standing balance support: Single extremity supported Standing balance-Leahy Scale: Fair Standing balance comment: RUE on QC. Improves with time, vunsteady/anxious/fearful of falling.                           ADL either performed or assessed with clinical judgement   ADL Overall ADL's : Needs assistance/impaired     Grooming: Wash/dry face;Wash/dry hands;Standing;Contact guard assist Grooming Details (indicate cue type and reason): CGA standing at sink                 Toilet Transfer: Minimal assistance Toilet Transfer Details (indicate cue type and reason): minA, pt anxious and requiring reassurance throughout Toileting- Clothing Manipulation and Hygiene: Maximal assistance Toileting - Clothing Manipulation Details (indicate cue type and reason): pt with small incontinent BM in brief, unaware. able to doff over hips with one hand, minA for standing balance. MAX A for posterior pericare in standing cues to NWB via LUE     Functional mobility during ADLs: Contact guard assist;Cueing for sequencing;Cane General  ADL Comments: t/f bathroom using QC, CGA with min vcs for AD use. walks to bathroom with 1 episode of pt fearing she lost her balance and crying out. assist to control descent in recliner     Communication Communication Communication: No apparent difficulties   Cognition  Arousal: Alert Behavior During Therapy: Anxious Cognition: Cognition impaired     Awareness: Intellectual awareness impaired, Online awareness impaired Memory impairment (select all impairments): Short-term memory, Working memory, Non-declarative long-term memory, Geneticist, molecular long-term memory Attention impairment (select first level of impairment): Focused attention Executive functioning impairment (select all impairments): Initiation, Organization, Reasoning, Sequencing, Problem solving OT - Cognition Comments: improved cognition, requires cues for NWB on LUE                 Following commands: Intact Following commands impaired: Follows one step commands with increased time      Cueing   Cueing Techniques: Verbal cues, Tactile cues  Exercises Other Exercises Other Exercises: BSC over commode to raise height    Shoulder Instructions       General Comments LUE in splint, RUE with noted bruising    Pertinent Vitals/ Pain       Pain Assessment Pain Assessment: Faces Faces Pain Scale: Hurts little more Pain Descriptors / Indicators: Aching, Discomfort, Sore Pain Intervention(s): Limited activity within patient's tolerance, Monitored during session, Repositioned   Frequency  Min 2X/week        Progress Toward Goals  OT Goals(current goals can now be found in the care plan section)  Progress towards OT goals: Progressing toward goals  Acute Rehab OT Goals OT Goal Formulation: With patient/family Time For Goal Achievement: 11/29/23 Potential to Achieve Goals: Good ADL Goals Pt Will Perform Grooming: with supervision Pt Will Perform Lower Body Dressing: sitting/lateral leans;sit to/from stand;with supervision Pt Will Transfer to Toilet: with supervision;ambulating Pt Will Perform Toileting - Clothing Manipulation and hygiene: with supervision;sitting/lateral leans;sit to/from stand  Plan         AM-PAC OT "6 Clicks" Daily Activity     Outcome Measure    Help from another person eating meals?: A Lot Help from another person taking care of personal grooming?: A Lot Help from another person toileting, which includes using toliet, bedpan, or urinal?: Total Help from another person bathing (including washing, rinsing, drying)?: A Lot Help from another person to put on and taking off regular upper body clothing?: A Lot Help from another person to put on and taking off regular lower body clothing?: A Lot 6 Click Score: 11    End of Session Equipment Utilized During Treatment: Gait belt;Other (comment) (QC)  OT Visit Diagnosis: Other abnormalities of gait and mobility (R26.89);Muscle weakness (generalized) (M62.81);History of falling (Z91.81);Pain Pain - Right/Left: Left Pain - part of body: Hand;Arm   Activity Tolerance Patient tolerated treatment well   Patient Left in chair;with call bell/phone within reach;with chair alarm set   Nurse Communication Mobility status        Time: 1001-1027 OT Time Calculation (min): 26 min  Charges: OT General Charges $OT Visit: 1 Visit OT Treatments $Self Care/Home Management : 23-37 mins  Vinny Taranto L. Lovett Coffin, OTR/L  11/16/23, 11:49 AM

## 2023-11-16 NOTE — Progress Notes (Addendum)
 Mobility Specialist - Progress Note   11/16/23 1119  Mobility  Activity Transferred from chair to bed;Ambulated with assistance in room  Level of Assistance Contact guard assist, steadying assist  Assistive Device None (HHA)  Distance Ambulated (ft) 6 ft  LUE Weight Bearing Per Provider Order NWB  Activity Response Tolerated well  Mobility visit 1 Mobility  Mobility Specialist Start Time (ACUTE ONLY) 1053  Mobility Specialist Stop Time (ACUTE ONLY) 1100  Mobility Specialist Time Calculation (min) (ACUTE ONLY) 7 min   Pt sitting in the recliner upon entry, utilizing RA. Pt very vocal expressing discomfort, requesting to transfer to bed d/t hip pain. Pt STS MinA and transferred to the recliner HHA-CGA, remaining NWB of the LUE-- short shuffle steps toward the [facility]. Pt left side lying with alarm set and needs within reach. RN notified.  Zetta Bills Mobility Specialist 11/16/23 11:27 AM

## 2023-11-16 NOTE — TOC Transition Note (Signed)
 Transition of Care [facility]) - Discharge Note   Patient Details  Name: Morgan Bowen MRN: [id number] Date of Birth: August 26, 1935  Transition of Care [facility] CM/SW Contact:  Cherre Blanc, RN Phone Number: 11/16/2023, 10:54 AM   Clinical Narrative:     Patient is medically clear for discharge to [facility] for short term rehab. Patient's son, Morgan Bowen, notified and is agreeable with the dc plan. Transportation to the facility arranged with [facility].  No TOC needs Identfied.  Nurse to Call report to [phone] Patient is going to room 303.  Final next level of care: Skilled Nursing Facility Barriers to Discharge: SNF Pending bed offer, Insurance Authorization   Patient Goals and CMS Choice            Discharge Placement              Patient chooses bed at: [facility] Patient to be transferred to facility by: [facility] Name of family member notified: Morgan Bowen [phone] Patient and family notified of of transfer: 11/16/23  Discharge Plan and Services Additional resources added to the After Visit Summary for     Discharge Planning Services: CM Consult                                 Social Drivers of Health (SDOH) Interventions SDOH Screenings   Food Insecurity: No Food Insecurity (11/08/2023)  Housing: Low Risk  (11/08/2023)  Transportation Needs: No Transportation Needs (11/08/2023)  Utilities: Not At Risk (11/08/2023)  Alcohol Screen: Low Risk  (04/27/2023)  Depression (PHQ2-9): Low Risk  (05/11/2023)  Financial Resource Strain: Low Risk  (04/27/2023)  Physical Activity: Inactive (04/27/2023)  Social Connections: Socially Isolated (11/08/2023)  Stress: No Stress Concern Present (04/27/2023)  Tobacco Use: Low Risk  (11/10/2023)  Health Literacy: Adequate Health Literacy (04/27/2023)     Readmission Risk Interventions     No data to display

## 2023-11-16 NOTE — Discharge Summary (Signed)
 Physician Discharge Summary   Patient: Morgan Bowen MRN: [id number] DOB: 09-01-1935  Admit date:     11/07/2023  Discharge date: 11/16/23  Discharge Physician: Marcelino Duster   PCP: Doreene Nest, NP   Recommendations at discharge:    PCP follow up in 1 week.  Discharge Diagnoses: Principal Problem:   Acute metabolic encephalopathy Active Problems:   SDH (subdural hematoma) (HCC)   Type 2 diabetes mellitus with other specified complication (HCC)   HLD (hyperlipidemia)   Essential hypertension   Microcytic anemia   Nausea vomiting and diarrhea   Anxiety and depression   Left wrist fracture   B12 deficiency anemia  Resolved Problems:   * No resolved hospital problems. [facility] Course: Patient is a 88 year old female  with medical history significant of HTN, HLD, DM, depression with anxiety, anemia, DVT not on anticoagulants, recent left wrist fracture, who presents with AMS, slurred speech, right facial droop.  Per patient son, symptoms started after taking tramadol. Seen by neurology, MRI scan did not show any stroke.  Patient is not a candidate for antiplatelet treatment due to subdural hematoma. Patient also had open reduction and internal fixation of a left distal radius fracture 4/3.   Condition has improved, currently stable to be discharged to nursing home placement.  Assessment and Plan: Acute metabolic encephalopathy with facial droop. Bilateral subdural hematoma. Old left basal ganglia and cerebellar stroke. Stroke is ruled out with MRI.  Patient has been seen by neurology, not a candidate for antiplatelet treatment due to bilateral subdural hematoma.  Patient could have had bad reaction to tramadol. Patient mental status improved, avoid tramadol, limit opiates. Patient feels well, no recurrence facial droop.  Mental status improved. Echocardiogram showed ejection fraction of 65 to 70% with diastolic dysfunction. Patient mental status is back to  baseline, currently being discharged to nursing home placement.   Acute on chronic anemia. B12 deficient anemia. Patient hemoglobin has dropped down to 7.1, patient never had any black stool or rectal bleeding.  Neurology recommended treated with B12 injection x 7 days followed with 2000 mg daily.   Patient received a unit of PRBC on 3/4 for surgery.  Hemoglobin has been improved.  Completed 7 days of injectable B12, changed to oral B12 by tomorrow.   Chronic diarrhea. Hyponatremia. Hypokalemia. Given malabsorption of B12, patient most likely has pancreatic insufficiency causing absorption and diarrhea.  Started Creon. Electrolytes repleted. Diarrhea also improved. Electrolytes normalized. Outpatient PCP follow up for repeat BMP.   Uncontrolled type 2 diabetes with hyperglycemia. A1c 8.8 She is on oral hypoglycemics, stopped Glimepiride due to risk of hypoglycemia. Started her on Sliding scale insulin.   Left wrist fracture status post reduction, in cast.   Postsurgery -Open reduction and internal fixation of left distal radius fracture.  Continue as needed oral pain medicine as prescribed. Follow up with orthopedics in 1 week.   Essential hypertension. Restarted home meds Norvasc, beta blocker, ARB.        Consultants: orthopedics, neurology Procedures performed: Open reduction and internal fixation of left distal radius fracture.  Disposition: Skilled nursing facility Diet recommendation:  Discharge Diet Orders (From admission, onward)     Start     Ordered   11/16/23 0000  Diet - low sodium heart healthy        11/16/23 1258   11/16/23 0000  Diet Carb Modified        11/16/23 1258           Cardiac  and Carb modified diet DISCHARGE MEDICATION: Allergies as of 11/16/2023       Reactions   Macrobid [nitrofurantoin] Nausea Only   Pts son wanted noted that macrobid caused bad nausea   Sulfamethoxazole-trimethoprim    Other reaction(s): Not available   Tramadol  Other (See Comments)   hallucinations   Penicillins Rash        Medication List     STOP taking these medications    glimepiride 4 MG tablet Commonly known as: AMARYL   traMADol 50 MG tablet Commonly known as: Ultram       TAKE these medications    amLODipine 10 MG tablet Commonly known as: NORVASC TAKE ONE TABLET BY MOUTH ONCE DAILY FOR BLOOD PRESSURE What changed: See the new instructions.   ascorbic acid 500 MG tablet Commonly known as: VITAMIN C Take 500 mg by mouth daily.   atorvastatin 40 MG tablet Commonly known as: LIPITOR Take 1 tablet (40 mg total) by mouth daily. Start taking on: November 17, 2023   Calcium-Magnesium-Vitamin D 500-250-125 MG-MG-UNIT Tabs Take 1 tablet by mouth daily.   co-enzyme Q-10 30 MG capsule Take 30 mg by mouth 3 (three) times daily.   cyanocobalamin 2000 MCG tablet Take 1 tablet (2,000 mcg total) by mouth daily. Start taking on: November 17, 2023   Fish Oil 300 MG Caps Take by mouth.   glucose blood test strip Commonly known as: ONE TOUCH ULTRA TEST USE AS DIRECTED TEST 3 TIMES DAILY E11.9   insulin aspart 100 UNIT/ML injection Commonly known as: novoLOG Inject 0-9 Units into the skin 3 (three) times daily with meals. CBG < 70: Implement Hypoglycemia Standing Orders and refer to Hypoglycemia Standing Orders sidebar report CBG 70 - 120: 0 units CBG 121 - 150: 1 unit CBG 151 - 200: 2 units CBG 201 - 250: 3 units CBG 251 - 300: 5 units CBG 301 - 350: 7 units CBG 351 - 400: 9 units CBG > 400: call MD and obtain STAT lab verification   insulin aspart 100 UNIT/ML injection Commonly known as: novoLOG Inject 0-5 Units into the skin at bedtime. CBG < 70: Implement Hypoglycemia Standing Orders and refer to Hypoglycemia Standing Orders sidebar report CBG 70 - 120: 0 units CBG 121 - 150: 0 units CBG 151 - 200: 0 units CBG 201 - 250: 2 units CBG 251 - 300: 3 units CBG 301 - 350: 4 units CBG 351 - 400: 5 units CBG > 400: call MD and obtain  STAT lab verification   irbesartan 300 MG tablet Commonly known as: AVAPRO TAKE 1 TABLET BY MOUTH ONCE DAILY FOR BLOOD PRESSURE What changed: See the new instructions.   metFORMIN 1000 MG tablet Commonly known as: GLUCOPHAGE TAKE 1 TABLET BY MOUTH TWICE DAILY WITH A MEAL FOR DIABETES What changed: See the new instructions.   metoprolol succinate 25 MG 24 hr tablet Commonly known as: TOPROL-XL TAKE ONE TABLET BY MOUTH TWICE DAILY FOR blood pressure.   oxyCODONE-acetaminophen 5-325 MG tablet Commonly known as: PERCOCET/ROXICET Take 1 tablet by mouth every 6 (six) hours as needed for moderate pain (pain score 4-6).   senna-docusate 8.6-50 MG tablet Commonly known as: Senokot-S Take 2 tablets by mouth 2 (two) times daily.   Vitamin D (Ergocalciferol) 1.25 MG (50000 UNIT) Caps capsule Commonly known as: DRISDOL TAKE 1 CAPSULE BY MOUTH ONCE WEEKLY        Follow-up Information     Anson Oregon, PA-C Follow up in 14 day(s).  Specialty: Physician Assistant Why: Suture and splint removal. Contact information: [address] [place] [address] [phone]         Doreene Nest, NP Follow up.   Specialty: Internal Medicine Why: Hospital follow up Contact information: [address] [place] [address] [phone]                Discharge Exam: Filed Weights   11/07/23 2002 11/10/23 1025  Weight: 54.4 kg 54.4 kg      11/16/2023    7:45 AM 11/16/2023    5:01 AM 11/15/2023    7:33 PM  Vitals with BMI  Systolic 172 169 440  Diastolic 66 71 60  Pulse 84 89 88   General - Elderly Caucasian female, no apparent distress HEENT - PERRLA, EOMI, atraumatic head, non tender sinuses. Lung - Clear, no rales, rhonchi, wheezes. Heart - S1, S2 heard, no murmurs, rubs, trace pedal edema. Abdomen - Soft, non tender, bowel sounds good Neuro - Alert, awake and oriented x 3, non focal exam. Skin - Warm and dry. Left wrist dressing  noted.  Condition at discharge: stable  The results of significant diagnostics from this hospitalization (including imaging, microbiology, ancillary and laboratory) are listed below for reference.   Imaging Studies: ECHOCARDIOGRAM COMPLETE BUBBLE STUDY Result Date: 11/11/2023    ECHOCARDIOGRAM REPORT   Patient Name:   Morgan Bowen Date of Exam: 11/11/2023 Medical Rec #:  [id number]    Height:       65.0 in Accession #:    [id number]   Weight:       120.0 lb Date of Birth:  Dec 29, 1935     BSA:          1.592 m Patient Age:    87 years     BP:           133/51 mmHg Patient Gender: F            HR:           81 bpm. Exam Location:  [facility] Procedure: 2D Echo, Cardiac Doppler, Color Doppler and Saline Contrast Bubble            Study (Both Spectral and Color Flow Doppler were utilized during            procedure). Indications:     Stroke 434.91 / I63.9  History:         Patient has no prior history of Echocardiogram examinations.                  Risk Factors:Diabetes and Hypertension. Dizziness.  Sonographer:     Cristela Blue Referring Phys:  [id number] [facility] Diagnosing Phys: Debbe Odea MD IMPRESSIONS  1. Left ventricular ejection fraction, by estimation, is 65 to 70%. The left ventricle has normal function. The left ventricle has no regional wall motion abnormalities. Left ventricular diastolic parameters are consistent with Grade I diastolic dysfunction (impaired relaxation).  2. Right ventricular systolic function is normal. The right ventricular size is normal.  3. The mitral valve is normal in structure. No evidence of mitral valve regurgitation.  4. The aortic valve is tricuspid. Aortic valve regurgitation is mild. Aortic valve sclerosis is present, with no evidence of aortic valve stenosis.  5. Agitated saline contrast bubble study was negative, with no evidence of any interatrial shunt. FINDINGS  Left Ventricle: Left ventricular ejection fraction, by estimation, is 65 to 70%. The left ventricle has  normal function. The  left ventricle has no regional wall motion abnormalities. The left ventricular internal cavity size was normal in size. There is  no left ventricular hypertrophy. Left ventricular diastolic parameters are consistent with Grade I diastolic dysfunction (impaired relaxation). Right Ventricle: The right ventricular size is normal. No increase in right ventricular wall thickness. Right ventricular systolic function is normal. Left Atrium: Left atrial size was normal in size. Right Atrium: Right atrial size was normal in size. Pericardium: There is no evidence of pericardial effusion. Mitral Valve: The mitral valve is normal in structure. No evidence of mitral valve regurgitation. MV peak gradient, 8.0 mmHg. The mean mitral valve gradient is 3.0 mmHg. Tricuspid Valve: The tricuspid valve is not well visualized. Tricuspid valve regurgitation is not demonstrated. Aortic Valve: The aortic valve is tricuspid. Aortic valve regurgitation is mild. Aortic valve sclerosis is present, with no evidence of aortic valve stenosis. Aortic valve mean gradient measures 2.0 mmHg. Aortic valve peak gradient measures 3.9 mmHg. Aortic valve area, by VTI measures 3.99 cm. Pulmonic Valve: The pulmonic valve was normal in structure. Pulmonic valve regurgitation is trivial. Aorta: The aortic root is normal in size and structure. IAS/Shunts: No atrial level shunt detected by color flow Doppler. Agitated saline contrast was given intravenously to evaluate for intracardiac shunting. Agitated saline contrast bubble study was negative, with no evidence of any interatrial shunt.  LEFT VENTRICLE PLAX 2D LVIDd:         3.70 cm   Diastology LVIDs:         1.90 cm   LV e' medial:    7.83 cm/s LV PW:         1.30 cm   LV E/e' medial:  8.2 LV IVS:        1.00 cm   LV e' lateral:   7.94 cm/s LVOT diam:     2.00 cm   LV E/e' lateral: 8.0 LV SV:         64 LV SV Index:   40 LVOT Area:     3.14 cm  RIGHT VENTRICLE RV Basal diam:  3.10 cm  RV Mid diam:    3.00 cm RV S prime:     16.60 cm/s TAPSE (M-mode): 2.5 cm LEFT ATRIUM           Index        RIGHT ATRIUM           Index LA diam:      2.70 cm 1.70 cm/m   RA Area:     11.70 cm LA Vol (A2C): 46.6 ml 29.27 ml/m  RA Volume:   25.60 ml  16.08 ml/m LA Vol (A4C): 30.8 ml 19.35 ml/m  AORTIC VALVE AV Area (Vmax):    3.11 cm AV Area (Vmean):   3.34 cm AV Area (VTI):     3.99 cm AV Vmax:           98.30 cm/s AV Vmean:          64.400 cm/s AV VTI:            0.160 m AV Peak Grad:      3.9 mmHg AV Mean Grad:      2.0 mmHg LVOT Vmax:         97.40 cm/s LVOT Vmean:        68.400 cm/s LVOT VTI:          0.203 m LVOT/AV VTI ratio: 1.27  AORTA Ao Root diam: 3.40 cm MITRAL VALVE  TRICUSPID VALVE MV Area (PHT): 4.15 cm     TR Peak grad:   25.0 mmHg MV Area VTI:   3.08 cm     TR Vmax:        250.00 cm/s MV Peak grad:  8.0 mmHg MV Mean grad:  3.0 mmHg     SHUNTS MV Vmax:       1.41 m/s     Systemic VTI:  0.20 m MV Vmean:      81.8 cm/s    Systemic Diam: 2.00 cm MV Decel Time: 183 msec MV E velocity: 63.90 cm/s MV A velocity: 108.00 cm/s MV E/A ratio:  0.59 Debbe Odea MD Electronically signed by Debbe Odea MD Signature Date/Time: 11/11/2023/10:33:33 AM    Final    [place] OR NERVE BLOCK-IMAGE ONLY [facility]) Result Date: 11/10/2023 There is no interpretation for this exam.  This order is for images obtained during a surgical procedure.  Please See "Surgeries" Tab for more information regarding the procedure.   DG MINI C-ARM IMAGE ONLY Result Date: 11/10/2023 There is no interpretation for this exam.  This order is for images obtained during a surgical procedure.  Please See "Surgeries" Tab for more information regarding the procedure.   EEG adult Result Date: 11/09/2023 Charlsie Quest, MD     11/09/2023  4:39 PM Patient Name: Morgan Bowen MRN: [id number] Epilepsy Attending: Charlsie Quest Referring Physician/Provider: Caryl Pina, MD Date: 11/09/2023 Duration: 22.21 mins Patient  history: 88 year old female with altered mental status.  EEG to evaluate for seizure. Level of alertness: Awake, drowsy AEDs during EEG study: None Technical aspects: This EEG study was done with scalp electrodes positioned according to the 10-20 International system of electrode placement. Electrical activity was reviewed with band pass filter of 1-70Hz , sensitivity of 7 uV/mm, display speed of 77mm/sec with a 60Hz  notched filter applied as appropriate. EEG data were recorded continuously and digitally stored.  Video monitoring was available and reviewed as appropriate. Description: The posterior dominant rhythm consists of 8 Hz activity of moderate voltage (25-35 uV) seen predominantly in posterior head regions, symmetric and reactive to eye opening and eye closing. Drowsiness was characterized by attenuation of the posterior background rhythm. EEG showed intermittent generalized 3 to 6 Hz theta-delta slowing.  Photic driving was not seen during photic stimulation. Hyperventilation and was not performed.   ABNORMALITY - Intermittent slow, generalized IMPRESSION: This study is suggestive of mild diffuse encephalopathy. No seizures or epileptiform discharges were seen throughout the recording. Charlsie Quest   MR BRAIN WO CONTRAST Result Date: 11/08/2023 CLINICAL DATA:  Altered mental status and facial droop EXAM: MRI HEAD WITHOUT CONTRAST TECHNIQUE: Multiplanar, multiecho pulse sequences of the brain and surrounding structures were obtained without intravenous contrast. COMPARISON:  None Available. FINDINGS: Brain: No acute infarct. Bilateral chronic subdural hematomas, measuring up to 4 mm. There is multifocal hyperintense T2-weighted signal within the white matter. Parenchymal volume and CSF spaces are normal. The midline structures are normal. Old left cerebellar and left basal ganglia small vessel infarcts. Vascular: Normal flow voids. Skull and upper cervical spine: Normal calvarium and skull base.  Visualized upper cervical spine and soft tissues are normal. Sinuses/Orbits:No paranasal sinus fluid levels or advanced mucosal thickening. No mastoid or middle ear effusion. Normal orbits. IMPRESSION: 1. No acute intracranial abnormality. 2. Bilateral chronic subdural hematomas, measuring up to 4 mm. 3. Old left cerebellar and left basal ganglia small vessel infarcts. Electronically Signed   By: Deatra Robinson M.D.   On:  11/08/2023 02:57   CT ABDOMEN PELVIS WO CONTRAST Result Date: 11/08/2023 CLINICAL DATA:  Bowel obstruction EXAM: CT ABDOMEN AND PELVIS WITHOUT CONTRAST TECHNIQUE: Multidetector CT imaging of the abdomen and pelvis was performed following the standard protocol without IV contrast. RADIATION DOSE REDUCTION: This exam was performed according to the departmental dose-optimization program which includes automated exposure control, adjustment of the mA and/or kV according to patient size and/or use of iterative reconstruction technique. COMPARISON:  None Available. FINDINGS: Lower chest: Moderate right coronary artery calcification. No acute abnormality. Hepatobiliary: No focal liver abnormality is seen. Status post cholecystectomy. No biliary dilatation. Pancreas: Atrophic but otherwise unremarkable Spleen: Unremarkable Adrenals/Urinary Tract: The adrenal glands are unremarkable. The kidneys are normal in size and position. Excreted contrast within the collecting system. No hydronephrosis. No definite intraluminal defect identified. Mild bilateral nonspecific perinephric inflammatory stranding. No perinephric fluid collections are seen. Bladder is mildly distended but otherwise unremarkable. Stomach/Bowel: Moderate to severe pancolonic diverticulosis without superimposed focal inflammatory change. Stomach, small bowel, and large bowel are otherwise unremarkable. Appendix absent. No free intraperitoneal gas or fluid. Vascular/Lymphatic: Aortic atherosclerosis. No enlarged abdominal or pelvic lymph  nodes. Reproductive: Status post hysterectomy. No adnexal masses. Other: No abdominal wall hernia or abnormality. No abdominopelvic ascites. Musculoskeletal: Remote L1 superior endplate fracture with approximately 50% loss of height. Moderate thoracic dextroscoliosis, apex right at L2. No acute bone abnormality. No lytic or blastic bone lesion. IMPRESSION: 1. No acute intra-abdominal pathology identified. No definite radiographic explanation for the patient's reported symptoms. 2. Moderate right coronary artery calcification. 3. Moderate to severe pancolonic diverticulosis without superimposed focal inflammatory change. 4. Remote L1 superior endplate fracture with approximately 50% loss of height. Aortic Atherosclerosis (ICD10-I70.0). Electronically Signed   By: Helyn Numbers M.D.   On: 11/08/2023 00:31   CT ANGIO HEAD NECK W WO CM W PERF (CODE STROKE) Result Date: 11/07/2023 CLINICAL DATA:  Right-sided weakness and altered mental status EXAM: CT ANGIOGRAPHY HEAD AND NECK CT PERFUSION BRAIN TECHNIQUE: Multidetector CT imaging of the head and neck was performed using the standard protocol during bolus administration of intravenous contrast. Multiplanar CT image reconstructions and MIPs were obtained to evaluate the vascular anatomy. Carotid stenosis measurements (when applicable) are obtained utilizing NASCET criteria, using the distal internal carotid diameter as the denominator. Multiphase CT imaging of the brain was performed following IV bolus contrast injection. Subsequent parametric perfusion maps were calculated using RAPID software. RADIATION DOSE REDUCTION: This exam was performed according to the departmental dose-optimization program which includes automated exposure control, adjustment of the mA and/or kV according to patient size and/or use of iterative reconstruction technique. CONTRAST:  OMNIPAQUE IOHEXOL 350 MG/ML SOLN COMPARISON:  None Available. FINDINGS: CTA NECK FINDINGS Skeleton: No  acute abnormality or high grade bony spinal canal stenosis. Other neck: Normal pharynx, larynx and major salivary glands. No cervical lymphadenopathy. Unremarkable thyroid gland. Upper chest: No pneumothorax or pleural effusion. No nodules or masses. Aortic arch: There is calcific atherosclerosis of the aortic arch. Conventional 3 vessel aortic branching pattern. RIGHT carotid system: No dissection, occlusion or aneurysm. Mild atherosclerotic calcification at the carotid bifurcation without hemodynamically significant stenosis. LEFT carotid system: No dissection, occlusion or aneurysm. Mild atherosclerotic calcification at the carotid bifurcation without hemodynamically significant stenosis. Vertebral arteries: Codominant configuration. Mild stenosis of the right vertebral artery origin due to atherosclerotic calcification. The vertebral arteries are otherwise normal to the skull base. CTA HEAD FINDINGS POSTERIOR CIRCULATION: Bilateral atherosclerotic calcification without flow limiting stenosis. No proximal occlusion of the anterior  or inferior cerebellar arteries. Basilar artery is normal. Superior cerebellar arteries are normal. Posterior cerebral arteries are normal. ANTERIOR CIRCULATION: Atherosclerotic calcification of the internal carotid arteries at the skull base without hemodynamically significant stenosis. Anterior cerebral arteries are normal. Middle cerebral arteries are normal. Venous sinuses: As permitted by contrast timing, patent. Anatomic variants: None Review of the MIP images confirms the above findings. CT Brain Perfusion Findings: ASPECTS: 10 CBF (<30%) Volume: 0mL Perfusion (Tmax>6.0s) volume: 3mL Mismatch Volume: 3mL Infarction Location:The 3 mL area of ischemia is likely artifactual given suboptimal time-enhancement curve. IMPRESSION: 1. No emergent large vessel occlusion. 2. Mild stenosis of the right vertebral artery origin due to atherosclerotic calcification. 3. Mild atherosclerotic  calcification at the carotid bifurcations and skull base without hemodynamically significant stenosis. 4. A 3 mL area of ischemia on CT perfusion is likely artifactual given suboptimal time-enhancement curve. Electronically Signed   By: Deatra Robinson M.D.   On: 11/07/2023 21:27   CT HEAD CODE STROKE WO CONTRAST Result Date: 11/07/2023 CLINICAL DATA:  Code stroke.  Acute neurologic deficit EXAM: CT HEAD WITHOUT CONTRAST TECHNIQUE: Contiguous axial images were obtained from the base of the skull through the vertex without intravenous contrast. RADIATION DOSE REDUCTION: This exam was performed according to the departmental dose-optimization program which includes automated exposure control, adjustment of the mA and/or kV according to patient size and/or use of iterative reconstruction technique. COMPARISON:  11/06/2023 FINDINGS: Brain: Unchanged appearance of bilateral low-density extra-axial collections, most consistent with chronic subdural hematomas. The size and configuration of the ventricles and extra-axial CSF spaces are normal. There is hypoattenuation of the periventricular white matter, most commonly indicating chronic ischemic microangiopathy. Old right parietal infarct. Vascular: No abnormal hyperdensity of the major intracranial arteries or dural venous sinuses. No intracranial atherosclerosis. Skull: The visualized skull base, calvarium and extracranial soft tissues are normal. Sinuses/Orbits: No fluid levels or advanced mucosal thickening of the visualized paranasal sinuses. No mastoid or middle ear effusion. The orbits are normal. ASPECTS [facility] Stroke Program Early CT Score) - Ganglionic level infarction (caudate, lentiform nuclei, internal capsule, insula, M1-M3 cortex): 7 - Supraganglionic infarction (M4-M6 cortex): 3 Total score (0-10 with 10 being normal): 10 IMPRESSION: 1. No acute intracranial abnormality. 2. ASPECTS is 10. 3. Unchanged appearance of bilateral low-density extra-axial  collections, most consistent with chronic subdural hematomas. These results were called by telephone at the time of interpretation on 11/07/2023 at 8:52 pm to provider DAVID [facility] , who verbally acknowledged these results. Electronically Signed   By: Deatra Robinson M.D.   On: 11/07/2023 20:55   DG Wrist 2 Views Left Result Date: 11/06/2023 CLINICAL DATA:  Left wrist fracture, closed reduction EXAM: LEFT WRIST - 2 VIEW COMPARISON:  11/06/2023 at 5:07 p.m. FINDINGS: Comminuted, impacted fracture of the distal left radius with fracture planes extending transversely into the distal radioulnar joint and longitudinally into the distal radial articular surface is again identified. Radial tilt has been improved and the degree impaction has been improved with resolution of previously noted ulnar positive for variance. There is, however, articular incongruity of the distal radial articular surface with diastasis of the major fracture fragments by up to 2 mm, 1 shaft dorsal displacement and marked dorsal angulation of the distal radial fracture fragment and resultant dorsal tilt of the distal radial articular surface by up to 30 degrees. No dislocation. Osseous structures are diffusely osteopenic. External immobilizer applied. IMPRESSION: 1. Improved radial tilt and impaction with resolution of previously noted ulnar positive for variance. Persistent marked  dorsal angulation and displacement of the distal radial fracture fragment with resultant dorsal tilt of the distal radial articular surface. Electronically Signed   By: Helyn Numbers M.D.   On: 11/06/2023 20:45   CT Cervical Spine Wo Contrast Result Date: 11/06/2023 CLINICAL DATA:  Neck trauma EXAM: CT CERVICAL SPINE WITHOUT CONTRAST TECHNIQUE: Multidetector CT imaging of the cervical spine was performed without intravenous contrast. Multiplanar CT image reconstructions were also generated. RADIATION DOSE REDUCTION: This exam was performed according to the departmental  dose-optimization program which includes automated exposure control, adjustment of the mA and/or kV according to patient size and/or use of iterative reconstruction technique. COMPARISON:  None Available. FINDINGS: Alignment: Normal alignment. Skull base and vertebrae: No acute fracture. No primary bone lesion or focal pathologic process. Soft tissues and spinal canal: No prevertebral fluid or swelling. No visible canal hematoma. Disc levels: Disc spaces are maintained. Mild bilateral degenerative facet disease, left greater than right. Upper chest: No acute findings Other: Chest wall soft tissues are unremarkable. No acute bony abnormality. IMPRESSION: No acute bony abnormality Electronically Signed   By: Charlett Nose M.D.   On: 11/06/2023 18:23   CT Head Wo Contrast Result Date: 11/06/2023 CLINICAL DATA:  Head trauma, minor (Age >= 65y) EXAM: CT HEAD WITHOUT CONTRAST TECHNIQUE: Contiguous axial images were obtained from the base of the skull through the vertex without intravenous contrast. RADIATION DOSE REDUCTION: This exam was performed according to the departmental dose-optimization program which includes automated exposure control, adjustment of the mA and/or kV according to patient size and/or use of iterative reconstruction technique. COMPARISON:  07/14/2015 FINDINGS: Brain: There are bilateral low-density subdural fluid collections, 8 mm on the right and 4 mm on the left compatible with chronic subdural hematomas or hygromas. No acute hemorrhage or infarction. No hydrocephalus. There is atrophy and chronic small vessel disease changes. Vascular: No hyperdense vessel or unexpected calcification. Skull: No acute calvarial abnormality. Sinuses/Orbits: No acute findings Other: None IMPRESSION: Bilateral chronic subdural hematomas or hygromas. No mass effect or midline shift. Atrophy, chronic microvascular disease. No acute intracranial abnormality. Electronically Signed   By: Charlett Nose M.D.   On:  11/06/2023 18:22   DG Lumbar Spine 2-3 Views Result Date: 11/06/2023 CLINICAL DATA:  Pain after fall. EXAM: LUMBAR SPINE - 2-3 VIEW COMPARISON:  08/16/2023. FINDINGS: There are 5 nonrib-bearing lumbar vertebrae. There is exaggerated lumbar lordosis. Redemonstration of dextroscoliosis of the lumbar spine with apex at L2-3 disc level. No spondylolisthesis. Redemonstration of moderate anterior wedging deformity of L1 vertebral body, similar to the prior study. Remaining vertebral body heights are maintained. No aggressive osseous lesion. Moderate Multilevel degenerative changes in the form of facet arthropathy and marginal osteophyte formation. Sacroiliac joints are symmetric. Visualized soft tissues are within normal limits. IMPRESSION: *No acute osseous abnormality of the lumbar spine. Electronically Signed   By: Jules Schick M.D.   On: 11/06/2023 18:09   DG Wrist Complete Left Result Date: 11/06/2023 CLINICAL DATA:  Fall, left arm deformity EXAM: LEFT WRIST - COMPLETE 3+ VIEW COMPARISON:  None Available. FINDINGS: There is a comminuted distal left radial metaphyseal fracture. Posterior angulation and displacement of the distal fragment. Displaced ulnar styloid fracture. Overlying soft tissue swelling. No subluxation or dislocation. IMPRESSION: Posteriorly displaced and angulated distal left radial and ulnar styloid fractures. Electronically Signed   By: Charlett Nose M.D.   On: 11/06/2023 17:24    Microbiology: Results for orders placed or performed during the hospital encounter of 10/26/23  Urine Culture  Status: Abnormal   Collection Time: 10/26/23 12:32 PM   Specimen: Urine, Clean Catch  Result Value Ref Range Status   Specimen Description URINE, CLEAN CATCH  Final   Special Requests   Final    NONE Performed at [facility], [address] [place], [place] [address]    Culture 40,000 COLONIES/mL KLEBSIELLA PNEUMONIAE (A)  Final   Report Status 10/28/2023 FINAL  Final   Organism  ID, Bacteria KLEBSIELLA PNEUMONIAE (A)  Final      Susceptibility   Klebsiella pneumoniae - MIC*    AMPICILLIN >=32 RESISTANT Resistant     CEFAZOLIN <=4 SENSITIVE Sensitive     CEFEPIME <=0.12 SENSITIVE Sensitive     CEFTRIAXONE <=0.25 SENSITIVE Sensitive     CIPROFLOXACIN <=0.25 SENSITIVE Sensitive     GENTAMICIN <=1 SENSITIVE Sensitive     IMIPENEM 0.5 SENSITIVE Sensitive     NITROFURANTOIN 64 INTERMEDIATE Intermediate     TRIMETH/SULFA <=20 SENSITIVE Sensitive     AMPICILLIN/SULBACTAM 4 SENSITIVE Sensitive     PIP/TAZO <=4 SENSITIVE Sensitive ug/mL    * 40,000 COLONIES/mL KLEBSIELLA PNEUMONIAE    Labs: CBC: Recent Labs  Lab 11/10/23 0348 11/11/23 0450 11/13/23 0419 11/16/23 0444  WBC 6.9 7.7 8.5  --   HGB 7.3* 7.6* 8.7* 9.2*  HCT 23.2* 23.6* 27.3*  --   MCV 78.9* 78.7* 80.1  --   PLT 307 293 271  --    Basic Metabolic Panel: Recent Labs  Lab 11/10/23 0348 11/11/23 0450 11/13/23 0419  NA 134* 133* 136  K 4.5 4.0 4.5  CL 104 103 102  CO2 24 24 26   GLUCOSE 174* 166* 184*  BUN 20 16 17   CREATININE 0.93 0.84 0.93  CALCIUM 8.5* 8.1* 8.7*  MG 2.0 1.9  --   PHOS 2.7  --   --    Liver Function Tests: No results for input(s): "AST", "ALT", "ALKPHOS", "BILITOT", "PROT", "ALBUMIN" in the last 168 hours. CBG: Recent Labs  Lab 11/15/23 1131 11/15/23 1538 11/15/23 1934 11/16/23 0742 11/16/23 1126  GLUCAP 245* 266* 297* 206* 236*    Discharge time spent: 36 minutes.  Signed: Marcelino Duster, MD [facility] 11/16/2023

## 2023-11-17 DIAGNOSIS — D509 Iron deficiency anemia, unspecified: Secondary | ICD-10-CM | POA: Diagnosis not present

## 2023-11-17 DIAGNOSIS — Z79899 Other long term (current) drug therapy: Secondary | ICD-10-CM | POA: Diagnosis not present

## 2023-11-17 DIAGNOSIS — S52502D Unspecified fracture of the lower end of left radius, subsequent encounter for closed fracture with routine healing: Secondary | ICD-10-CM | POA: Diagnosis not present

## 2023-11-17 DIAGNOSIS — R52 Pain, unspecified: Secondary | ICD-10-CM | POA: Diagnosis not present

## 2023-11-17 DIAGNOSIS — G9341 Metabolic encephalopathy: Secondary | ICD-10-CM | POA: Diagnosis not present

## 2023-11-17 DIAGNOSIS — S065XAA Traumatic subdural hemorrhage with loss of consciousness status unknown, initial encounter: Secondary | ICD-10-CM | POA: Diagnosis not present

## 2023-11-17 DIAGNOSIS — K5903 Drug induced constipation: Secondary | ICD-10-CM | POA: Diagnosis not present

## 2023-11-17 DIAGNOSIS — E538 Deficiency of other specified B group vitamins: Secondary | ICD-10-CM | POA: Diagnosis not present

## 2023-11-19 ENCOUNTER — Encounter: Payer: Self-pay | Admitting: Surgery

## 2023-11-21 ENCOUNTER — Inpatient Hospital Stay (HOSPITAL_COMMUNITY)

## 2023-11-21 ENCOUNTER — Emergency Department (HOSPITAL_COMMUNITY)

## 2023-11-21 ENCOUNTER — Other Ambulatory Visit: Payer: Self-pay

## 2023-11-21 ENCOUNTER — Encounter (HOSPITAL_COMMUNITY): Payer: Self-pay | Admitting: *Deleted

## 2023-11-21 ENCOUNTER — Inpatient Hospital Stay (HOSPITAL_COMMUNITY)
Admission: EM | Admit: 2023-11-21 | Discharge: 2023-12-08 | DRG: 871 | Disposition: E | Source: Skilled Nursing Facility | Attending: Pulmonary Disease | Admitting: Pulmonary Disease

## 2023-11-21 DIAGNOSIS — E538 Deficiency of other specified B group vitamins: Secondary | ICD-10-CM | POA: Diagnosis present

## 2023-11-21 DIAGNOSIS — Z885 Allergy status to narcotic agent status: Secondary | ICD-10-CM

## 2023-11-21 DIAGNOSIS — I2699 Other pulmonary embolism without acute cor pulmonale: Secondary | ICD-10-CM | POA: Diagnosis present

## 2023-11-21 DIAGNOSIS — D638 Anemia in other chronic diseases classified elsewhere: Secondary | ICD-10-CM | POA: Diagnosis present

## 2023-11-21 DIAGNOSIS — Z515 Encounter for palliative care: Secondary | ICD-10-CM

## 2023-11-21 DIAGNOSIS — E7220 Disorder of urea cycle metabolism, unspecified: Secondary | ICD-10-CM | POA: Diagnosis present

## 2023-11-21 DIAGNOSIS — E872 Acidosis, unspecified: Secondary | ICD-10-CM | POA: Diagnosis present

## 2023-11-21 DIAGNOSIS — I6203 Nontraumatic chronic subdural hemorrhage: Secondary | ICD-10-CM

## 2023-11-21 DIAGNOSIS — E8729 Other acidosis: Secondary | ICD-10-CM

## 2023-11-21 DIAGNOSIS — Z743 Need for continuous supervision: Secondary | ICD-10-CM | POA: Diagnosis not present

## 2023-11-21 DIAGNOSIS — Z8616 Personal history of COVID-19: Secondary | ICD-10-CM | POA: Diagnosis not present

## 2023-11-21 DIAGNOSIS — S065XAA Traumatic subdural hemorrhage with loss of consciousness status unknown, initial encounter: Secondary | ICD-10-CM

## 2023-11-21 DIAGNOSIS — Z888 Allergy status to other drugs, medicaments and biological substances status: Secondary | ICD-10-CM

## 2023-11-21 DIAGNOSIS — E119 Type 2 diabetes mellitus without complications: Secondary | ICD-10-CM

## 2023-11-21 DIAGNOSIS — A419 Sepsis, unspecified organism: Principal | ICD-10-CM | POA: Diagnosis present

## 2023-11-21 DIAGNOSIS — I2694 Multiple subsegmental pulmonary emboli without acute cor pulmonale: Secondary | ICD-10-CM | POA: Diagnosis not present

## 2023-11-21 DIAGNOSIS — E785 Hyperlipidemia, unspecified: Secondary | ICD-10-CM | POA: Diagnosis present

## 2023-11-21 DIAGNOSIS — R0682 Tachypnea, not elsewhere classified: Secondary | ICD-10-CM | POA: Diagnosis not present

## 2023-11-21 DIAGNOSIS — E875 Hyperkalemia: Secondary | ICD-10-CM | POA: Diagnosis present

## 2023-11-21 DIAGNOSIS — K219 Gastro-esophageal reflux disease without esophagitis: Secondary | ICD-10-CM | POA: Diagnosis present

## 2023-11-21 DIAGNOSIS — R6521 Severe sepsis with septic shock: Secondary | ICD-10-CM

## 2023-11-21 DIAGNOSIS — R1084 Generalized abdominal pain: Secondary | ICD-10-CM

## 2023-11-21 DIAGNOSIS — F0390 Unspecified dementia without behavioral disturbance: Secondary | ICD-10-CM | POA: Diagnosis not present

## 2023-11-21 DIAGNOSIS — M549 Dorsalgia, unspecified: Secondary | ICD-10-CM | POA: Diagnosis not present

## 2023-11-21 DIAGNOSIS — Z66 Do not resuscitate: Secondary | ICD-10-CM | POA: Diagnosis present

## 2023-11-21 DIAGNOSIS — E440 Moderate protein-calorie malnutrition: Secondary | ICD-10-CM | POA: Diagnosis present

## 2023-11-21 DIAGNOSIS — F32A Depression, unspecified: Secondary | ICD-10-CM | POA: Diagnosis present

## 2023-11-21 DIAGNOSIS — Z882 Allergy status to sulfonamides status: Secondary | ICD-10-CM

## 2023-11-21 DIAGNOSIS — G4089 Other seizures: Secondary | ICD-10-CM | POA: Diagnosis present

## 2023-11-21 DIAGNOSIS — I7091 Generalized atherosclerosis: Secondary | ICD-10-CM | POA: Diagnosis not present

## 2023-11-21 DIAGNOSIS — Z8673 Personal history of transient ischemic attack (TIA), and cerebral infarction without residual deficits: Secondary | ICD-10-CM

## 2023-11-21 DIAGNOSIS — K8689 Other specified diseases of pancreas: Secondary | ICD-10-CM | POA: Diagnosis present

## 2023-11-21 DIAGNOSIS — Q630 Accessory kidney: Secondary | ICD-10-CM | POA: Diagnosis not present

## 2023-11-21 DIAGNOSIS — Z8249 Family history of ischemic heart disease and other diseases of the circulatory system: Secondary | ICD-10-CM

## 2023-11-21 DIAGNOSIS — R6889 Other general symptoms and signs: Secondary | ICD-10-CM | POA: Diagnosis not present

## 2023-11-21 DIAGNOSIS — Z794 Long term (current) use of insulin: Secondary | ICD-10-CM

## 2023-11-21 DIAGNOSIS — N179 Acute kidney failure, unspecified: Principal | ICD-10-CM

## 2023-11-21 DIAGNOSIS — I10 Essential (primary) hypertension: Secondary | ICD-10-CM | POA: Diagnosis present

## 2023-11-21 DIAGNOSIS — Z7984 Long term (current) use of oral hypoglycemic drugs: Secondary | ICD-10-CM

## 2023-11-21 DIAGNOSIS — I6201 Nontraumatic acute subdural hemorrhage: Secondary | ICD-10-CM | POA: Diagnosis not present

## 2023-11-21 DIAGNOSIS — R109 Unspecified abdominal pain: Secondary | ICD-10-CM | POA: Diagnosis not present

## 2023-11-21 DIAGNOSIS — Z823 Family history of stroke: Secondary | ICD-10-CM

## 2023-11-21 DIAGNOSIS — I959 Hypotension, unspecified: Secondary | ICD-10-CM | POA: Diagnosis not present

## 2023-11-21 DIAGNOSIS — Z880 Allergy status to penicillin: Secondary | ICD-10-CM

## 2023-11-21 DIAGNOSIS — Z90710 Acquired absence of both cervix and uterus: Secondary | ICD-10-CM

## 2023-11-21 DIAGNOSIS — Z79899 Other long term (current) drug therapy: Secondary | ICD-10-CM

## 2023-11-21 DIAGNOSIS — Z86718 Personal history of other venous thrombosis and embolism: Secondary | ICD-10-CM | POA: Diagnosis not present

## 2023-11-21 DIAGNOSIS — K5730 Diverticulosis of large intestine without perforation or abscess without bleeding: Secondary | ICD-10-CM | POA: Diagnosis not present

## 2023-11-21 DIAGNOSIS — I6782 Cerebral ischemia: Secondary | ICD-10-CM | POA: Diagnosis not present

## 2023-11-21 DIAGNOSIS — R404 Transient alteration of awareness: Secondary | ICD-10-CM | POA: Diagnosis not present

## 2023-11-21 DIAGNOSIS — G9341 Metabolic encephalopathy: Secondary | ICD-10-CM | POA: Diagnosis present

## 2023-11-21 DIAGNOSIS — K6389 Other specified diseases of intestine: Secondary | ICD-10-CM | POA: Diagnosis present

## 2023-11-21 LAB — GLUCOSE, CAPILLARY
Glucose-Capillary: 205 mg/dL — ABNORMAL HIGH (ref 70–99)
Glucose-Capillary: 366 mg/dL — ABNORMAL HIGH (ref 70–99)

## 2023-11-21 LAB — COMPREHENSIVE METABOLIC PANEL WITH GFR
ALT: 19 U/L (ref 0–44)
AST: 43 U/L — ABNORMAL HIGH (ref 15–41)
Albumin: 2.7 g/dL — ABNORMAL LOW (ref 3.5–5.0)
Alkaline Phosphatase: 125 U/L (ref 38–126)
BUN: 47 mg/dL — ABNORMAL HIGH (ref 8–23)
CO2: <7 mmol/L — ABNORMAL LOW (ref 22–32)
Calcium: 9.1 mg/dL (ref 8.9–10.3)
Chloride: 94 mmol/L — ABNORMAL LOW (ref 98–111)
Creatinine, Ser: 4.93 mg/dL — ABNORMAL HIGH (ref 0.44–1.00)
GFR, Estimated: 8 mL/min — ABNORMAL LOW (ref >60)
Glucose, Bld: 119 mg/dL — ABNORMAL HIGH (ref 70–99)
Potassium: 5.4 mmol/L — ABNORMAL HIGH (ref 3.5–5.1)
Sodium: 137 mmol/L (ref 135–145)
Total Bilirubin: 0.9 mg/dL (ref 0.0–1.2)
Total Protein: 7.3 g/dL (ref 6.5–8.1)

## 2023-11-21 LAB — CBC WITH DIFFERENTIAL/PLATELET
Abs Immature Granulocytes: 2.84 10*3/uL — ABNORMAL HIGH (ref 0.00–0.07)
Basophils Absolute: 0.3 10*3/uL — ABNORMAL HIGH (ref 0.0–0.1)
Basophils Relative: 1 %
Eosinophils Absolute: 0.1 10*3/uL (ref 0.0–0.5)
Eosinophils Relative: 0 %
HCT: 37.2 % (ref 36.0–46.0)
Hemoglobin: 10.4 g/dL — ABNORMAL LOW (ref 12.0–15.0)
Immature Granulocytes: 9 %
Lymphocytes Relative: 11 %
Lymphs Abs: 3.3 10*3/uL (ref 0.7–4.0)
MCH: 26.1 pg (ref 26.0–34.0)
MCHC: 28 g/dL — ABNORMAL LOW (ref 30.0–36.0)
MCV: 93.5 fL (ref 80.0–100.0)
Monocytes Absolute: 1.1 10*3/uL — ABNORMAL HIGH (ref 0.1–1.0)
Monocytes Relative: 4 %
Neutro Abs: 23.3 10*3/uL — ABNORMAL HIGH (ref 1.7–7.7)
Neutrophils Relative %: 75 %
Platelets: 593 10*3/uL — ABNORMAL HIGH (ref 150–400)
RBC: 3.98 MIL/uL (ref 3.87–5.11)
RDW: 18.7 % — ABNORMAL HIGH (ref 11.5–15.5)
Smear Review: INCREASED
WBC: 31.2 10*3/uL — ABNORMAL HIGH (ref 4.0–10.5)
nRBC: 0 % (ref 0.0–0.2)

## 2023-11-21 LAB — PROCALCITONIN: Procalcitonin: 2 ng/mL

## 2023-11-21 LAB — TROPONIN I (HIGH SENSITIVITY)
Troponin I (High Sensitivity): 12 ng/L (ref <18)
Troponin I (High Sensitivity): 16 ng/L (ref <18)

## 2023-11-21 LAB — LACTIC ACID, PLASMA
Lactic Acid, Venous: >9 mmol/L (ref 0.5–1.9)
Lactic Acid, Venous: >9 mmol/L (ref 0.5–1.9)
Lactic Acid, Venous: >9 mmol/L (ref 0.5–1.9)

## 2023-11-21 LAB — TYPE AND SCREEN
ABO/RH(D): O POS
Antibody Screen: NEGATIVE

## 2023-11-21 LAB — TSH: TSH: 7.09 u[IU]/mL — ABNORMAL HIGH (ref 0.350–4.500)

## 2023-11-21 LAB — MAGNESIUM: Magnesium: 2.4 mg/dL (ref 1.7–2.4)

## 2023-11-21 LAB — LIPASE, BLOOD: Lipase: 23 U/L (ref 11–51)

## 2023-11-21 LAB — MRSA NEXT GEN BY PCR, NASAL: MRSA by PCR Next Gen: DETECTED — AB

## 2023-11-21 LAB — AMMONIA: Ammonia: 331 umol/L — ABNORMAL HIGH (ref 9–35)

## 2023-11-21 LAB — CBG MONITORING, ED: Glucose-Capillary: 172 mg/dL — ABNORMAL HIGH (ref 70–99)

## 2023-11-21 MED ORDER — LACTULOSE 10 GM/15ML PO SOLN: 30.0000 g | Freq: Once | ORAL | Status: DC

## 2023-11-21 MED ORDER — CALCIUM GLUCONATE-NACL 1-0.675 GM/50ML-% IV SOLN
1.0000 g | Freq: Once | INTRAVENOUS | Status: AC
  Administered 2023-11-21: 1000 mg via INTRAVENOUS
  Filled 2023-11-21: qty 50

## 2023-11-21 MED ORDER — LEVETIRACETAM IN NACL 500 MG/100ML IV SOLN: 500.0000 mg | INTRAVENOUS | Status: DC

## 2023-11-21 MED ORDER — GLYCOPYRROLATE 1 MG PO TABS: 1.0000 mg | ORAL_TABLET | ORAL | Status: DC | PRN

## 2023-11-21 MED ORDER — VASOPRESSIN 20 UNITS/100 ML INFUSION FOR SHOCK
0.0000 [IU]/min | INTRAVENOUS | Status: DC
  Administered 2023-11-21: 0.03 [IU]/min via INTRAVENOUS
  Filled 2023-11-21: qty 100

## 2023-11-21 MED ORDER — PIPERACILLIN-TAZOBACTAM 3.375 G IVPB 30 MIN
3.3750 g | Freq: Once | INTRAVENOUS | Status: AC
  Administered 2023-11-21: 3.375 g via INTRAVENOUS
  Filled 2023-11-21: qty 50

## 2023-11-21 MED ORDER — POLYETHYLENE GLYCOL 3350 17 G PO PACK: 17.0000 g | PACK | Freq: Every day | ORAL | Status: DC | PRN

## 2023-11-21 MED ORDER — LACTATED RINGERS IV BOLUS
1000.0000 mL | Freq: Once | INTRAVENOUS | Status: AC
  Administered 2023-11-21: 1000 mL via INTRAVENOUS

## 2023-11-21 MED ORDER — SODIUM CHLORIDE 0.9% FLUSH: 10.0000 mL | INTRAVENOUS | Status: DC | PRN

## 2023-11-21 MED ORDER — GLYCOPYRROLATE 0.2 MG/ML IJ SOLN: 0.2000 mg | INTRAMUSCULAR | Status: DC | PRN

## 2023-11-21 MED ORDER — SODIUM CHLORIDE 0.9 % IV SOLN
2.0000 g | INTRAVENOUS | Status: DC
  Administered 2023-11-21: 2 g via INTRAVENOUS
  Filled 2023-11-21: qty 20

## 2023-11-21 MED ORDER — MORPHINE 100MG IN NS 100ML (1MG/ML) PREMIX INFUSION
0.0000 mg/h | INTRAVENOUS | Status: DC
  Administered 2023-11-21: 5 mg/h via INTRAVENOUS
  Filled 2023-11-21: qty 100

## 2023-11-21 MED ORDER — CHLORHEXIDINE GLUCONATE CLOTH 2 % EX PADS
6.0000 | MEDICATED_PAD | Freq: Every day | CUTANEOUS | Status: DC
  Administered 2023-11-21: 6 via TOPICAL

## 2023-11-21 MED ORDER — MORPHINE BOLUS VIA INFUSION
5.0000 mg | INTRAVENOUS | Status: DC | PRN
  Administered 2023-11-21: 5 mg via INTRAVENOUS

## 2023-11-21 MED ORDER — SODIUM CHLORIDE 0.9 % IV SOLN: INTRAVENOUS | Status: DC

## 2023-11-21 MED ORDER — LACTATED RINGERS IV BOLUS
500.0000 mL | Freq: Once | INTRAVENOUS | Status: AC
  Administered 2023-11-21: 500 mL via INTRAVENOUS

## 2023-11-21 MED ORDER — SODIUM CHLORIDE 0.9 % IV SOLN: 10.0000 mg | INTRAVENOUS | Status: DC

## 2023-11-21 MED ORDER — LEVETIRACETAM IN NACL 1000 MG/100ML IV SOLN: 1000.0000 mg | Freq: Once | INTRAVENOUS | Status: DC

## 2023-11-21 MED ORDER — MORPHINE SULFATE (PF) 2 MG/ML IV SOLN
4.0000 mg | INTRAVENOUS | Status: DC | PRN
  Administered 2023-11-21: 4 mg via INTRAVENOUS
  Filled 2023-11-21: qty 2

## 2023-11-21 MED ORDER — ONDANSETRON HCL 4 MG/2ML IJ SOLN
4.0000 mg | Freq: Once | INTRAMUSCULAR | Status: AC
  Administered 2023-11-21: 4 mg via INTRAVENOUS
  Filled 2023-11-21: qty 2

## 2023-11-21 MED ORDER — VANCOMYCIN HCL IN DEXTROSE 1-5 GM/200ML-% IV SOLN
1000.0000 mg | Freq: Once | INTRAVENOUS | Status: DC
  Administered 2023-11-21: 1000 mg via INTRAVENOUS
  Filled 2023-11-21: qty 200

## 2023-11-21 MED ORDER — SODIUM CHLORIDE 0.9% FLUSH
10.0000 mL | Freq: Two times a day (BID) | INTRAVENOUS | Status: DC
  Administered 2023-11-21: 10 mL

## 2023-11-21 MED ORDER — ACETAMINOPHEN 325 MG PO TABS: 650.0000 mg | ORAL_TABLET | Freq: Four times a day (QID) | ORAL | Status: DC | PRN

## 2023-11-21 MED ORDER — LORAZEPAM 2 MG/ML IJ SOLN
1.0000 mg | Freq: Once | INTRAMUSCULAR | Status: AC
  Administered 2023-11-21: 1 mg via INTRAVENOUS

## 2023-11-21 MED ORDER — FENTANYL CITRATE PF 50 MCG/ML IJ SOSY
25.0000 ug | PREFILLED_SYRINGE | Freq: Once | INTRAMUSCULAR | Status: AC
  Administered 2023-11-21: 25 ug via INTRAVENOUS
  Filled 2023-11-21: qty 1

## 2023-11-21 MED ORDER — DOCUSATE SODIUM 100 MG PO CAPS: 100.0000 mg | ORAL_CAPSULE | Freq: Two times a day (BID) | ORAL | Status: DC | PRN

## 2023-11-21 MED ORDER — POLYVINYL ALCOHOL 1.4 % OP SOLN: 1.0000 [drp] | Freq: Four times a day (QID) | OPHTHALMIC | Status: DC | PRN

## 2023-11-21 MED ORDER — SODIUM CHLORIDE 0.9 % IV BOLUS
1000.0000 mL | Freq: Once | INTRAVENOUS | Status: AC
  Administered 2023-11-21: 1000 mL via INTRAVENOUS

## 2023-11-21 MED ORDER — HALOPERIDOL LACTATE 5 MG/ML IJ SOLN
2.0000 mg | Freq: Once | INTRAMUSCULAR | Status: AC
  Administered 2023-11-21: 2 mg via INTRAVENOUS
  Filled 2023-11-21: qty 1

## 2023-11-21 MED ORDER — INSULIN ASPART 100 UNIT/ML IJ SOLN
1.0000 [IU] | INTRAMUSCULAR | Status: DC
  Administered 2023-11-21: 3 [IU] via SUBCUTANEOUS

## 2023-11-21 MED ORDER — LEVETIRACETAM IN NACL 500 MG/100ML IV SOLN
500.0000 mg | Freq: Two times a day (BID) | INTRAVENOUS | Status: DC
  Administered 2023-11-21: 500 mg via INTRAVENOUS
  Filled 2023-11-21: qty 100

## 2023-11-21 MED ORDER — LORAZEPAM 2 MG/ML IJ SOLN
2.0000 mg | Freq: Two times a day (BID) | INTRAMUSCULAR | Status: DC | PRN
  Administered 2023-11-21: 2 mg via INTRAVENOUS
  Filled 2023-11-21: qty 1

## 2023-11-21 MED ORDER — LORAZEPAM 2 MG/ML IJ SOLN
INTRAMUSCULAR | Status: AC
  Filled 2023-11-21: qty 1

## 2023-11-21 MED ORDER — PANTOPRAZOLE SODIUM 40 MG IV SOLR
40.0000 mg | INTRAVENOUS | Status: DC
  Administered 2023-11-21: 40 mg via INTRAVENOUS
  Filled 2023-11-21: qty 10

## 2023-11-21 MED ORDER — IOHEXOL 350 MG/ML SOLN
80.0000 mL | Freq: Once | INTRAVENOUS | Status: AC | PRN
  Administered 2023-11-21: 80 mL via INTRAVENOUS

## 2023-11-21 MED ORDER — NOREPINEPHRINE 4 MG/250ML-% IV SOLN
0.0000 ug/min | INTRAVENOUS | Status: DC
  Administered 2023-11-21: 40 ug/min via INTRAVENOUS
  Administered 2023-11-21: 2 ug/min via INTRAVENOUS
  Filled 2023-11-21 (×3): qty 250

## 2023-11-21 MED ORDER — LACTULOSE 10 GM/15ML PO SOLN: 20.0000 g | Freq: Three times a day (TID) | ORAL | Status: DC

## 2023-11-21 MED ORDER — ACETAMINOPHEN 650 MG RE SUPP: 650.0000 mg | Freq: Four times a day (QID) | RECTAL | Status: DC | PRN

## 2023-11-21 MED ORDER — HEPARIN (PORCINE) 25000 UT/250ML-% IV SOLN
700.0000 [IU]/h | INTRAVENOUS | Status: DC
  Administered 2023-11-21: 700 [IU]/h via INTRAVENOUS
  Filled 2023-11-21: qty 250

## 2023-11-21 MED ORDER — LORAZEPAM 2 MG/ML IJ SOLN: 2.0000 mg | INTRAMUSCULAR | Status: DC | PRN

## 2023-11-21 MED ORDER — GLYCOPYRROLATE 0.2 MG/ML IJ SOLN
0.2000 mg | INTRAMUSCULAR | Status: DC | PRN
  Administered 2023-11-21: 0.2 mg via INTRAVENOUS
  Filled 2023-11-21: qty 1

## 2023-11-26 LAB — CULTURE, BLOOD (ROUTINE X 2)
Culture: NO GROWTH
Culture: NO GROWTH
Special Requests: ADEQUATE
Special Requests: ADEQUATE

## 2023-12-08 NOTE — Discharge Summary (Signed)
   Death Summary   Morgan Bowen AOZ:[id number] DOB: Jul 03, 1936 DOA: 12-02-2023  PCP: Gabriel John, NP  Admit date: December 02, 2023 Date of Death: 2023/12/02  Final Diagnoses:  Principal Problem:   Pulmonary emboli (HCC) Active Problems:   Septic shock (HCC)  Metabolic encephalopathy due to septic shock Clonic tonic seizures x2 Chronic bilateral SDH Septic shock, unclear source Lactic acidosis/AGMA Acute PE Possible proximal colonic mass with an enlarged ileocolic LN Elevated Ammonia AKI Hyperkalemia due to AGMA DM-2 HTN GERD Anxiety/depression Anemia of chronic illness Moderate PCM DNR/DNI  History of present illness:  A 88 year old female with DM-2, HTN, GERD, chronic bilateral SDH, anxiety, depression, Vit B 12 def, pancreatic insufficiency, anemia of chronic illness, moderate PCM, recent left radial fracture and ORIF (11/11/2023), and who was discharged from hospital to rehab 5 days ago. She presented to ED with abd and back pain started last night. She has chronic nausea, but there were no reported V/D, f/c/r, SOB, cough, wheezing, LL edema, dysuria, or left arm redness or drainage. She was found to have lactic acidosis, AKI, elevated ammonia level, colon mass and PE. Started on IV heparin drip, Zosyn, 1.5 L LR bolus, and Levophed. Right femoral CVC was inserted. Son the bedside and provided hx. No hx of smoking, alcohol drinking, or illicit drug use.   Hospital Course:  She was admitted to ICU and started on Levophed, IVF, IV heparin, Abx, warming blanket, and serial labs. She developed seizures x2 and stat CT head wo contrast showed worsening SDH, so IV Heparin was d/c, and she was loaded with Keppra after consulting with neurology.  Case was discussed son and he confirmed the DNR/DNI status. With worsening shock and increased vasopressors requirements, son and family decided to start comfort care based on her previous wishes. She expired 16:37.   Signed:  Arlyne Bering,  MD [facility] After hours pager: [phone]. December 02, 2023, 5:28 PM

## 2023-12-08 NOTE — Progress Notes (Signed)
 Discussed the case with neurology:  Her message is the following:  I discussed with Dr. Luke Salaam for consultation for seizure -- on my review of head CT confirmed with radiology, acute on chronic SDH in a patient who was started on heparin drip earlier due to PE in the setting of colonic mass, with septic shock of unclear etiology on pressors, recent left radial fracture s/p ORIF (/11/2023) ; DNR/DNI CODE STATUS Discussed that her etiology of seizure is SDH CCM has paused heparin drip, she remains on pressors to support her blood pressure but has not been hypertensive She has been ordered for Keppra 500 mg x 1 dose due to her renal function Per Dr. Marene Shape patient is very lethargic on examination (and note DNR/DNI CODE STATUS) SDH would primarily be managed by the neurosurgical team, with neurology following as needed for EEG monitoring and antiseizure medication titration depending on goals of care conversation Recommend goals of care discussion with family, discussion with neurosurgery regarding whether patient would be a candidate for any neurosurgical intervention (suspect unlikely to be a good candidate given her comorbidities and age) Certainly if family desires an agressive scope of care, neurology is happy to be involved for full consultation; options will be extremely limited given her lethargy, comorbidities, likely not a good surgical candidate, and likely very hard to control seizures due to the extent of her SDH and stated DNR/DNI CODE STATUS

## 2023-12-08 NOTE — H&P (Addendum)
 NAME:  MONICIA TSE, MRN:  [id number], DOB:  [birth year], LOS: 0 ADMISSION DATE:  11/30/2023, CONSULTATION DATE:  11/18/2023 REFERRING MD:  ED, CHIEF COMPLAINT: Shock    History of Present Illness:  A 88 year old female with DM-2, HTN, GERD, chronic bilateral SDH, anxiety, depression, Vit B 12 def, pancreatic insufficiency, anemia of chronic illness, moderate PCM, recent left radial fracture and ORIF (11/11/2023), and who was discharged from hospital to rehab 5 days ago. She presented to ED with abd and back pain started last night. She has chronic nausea, but there were no reported V/D, f/c/r, SOB, cough, wheezing, LL edema, dysuria, or left arm redness or drainage. She was found to have lactic acidosis, AKI, elevated ammonia level, colon mass and PE. Started on IV heparin drip, Zosyn, 1.5 L LR bolus, and Levophed. Right femoral CVC was inserted. Son the bedside and provided hx. No hx of smoking, alcohol drinking, or illicit drug use.   Pertinent  Medical History  DM-2, HTN, GERD, chronic bilateral SDH, anxiety, depression, Vit B 12 def, pancreatic insufficiency, anemia of chronic illness, moderate PCM, left radial fracture and ORIF (11/11/2023)  Significant Hospital Events: Including procedures, antibiotic start and stop dates in addition to other pertinent events   4/14: IV heparin drip, Zosyn x1, s/p 1.5 L LR bolus, and Levophed 20 mcg/min. Right femoral CVC was inserted.  4/14: admit to ICU. Hypothermic. Placed on warming blanket. Developed clonic tonic seizures x2 and AMS in ICU. Stat CT head wo contrast and IV Ativan were given. Brief desaturated to 66% while seizing. IV heparin was stopped.    Interim History / Subjective:    Objective   Blood pressure (!) 103/45, pulse 65, temperature (!) 88.2 F (31.2 C), resp. rate 16, height 5\' 5"  (1.651 m), weight 54.4 kg, SpO2 100%.        Intake/Output Summary (Last 24 hours) at 11/10/2023 1225 Last data filed at 11/30/2023 0640 Gross per 24 hour   Intake 1092.06 ml  Output --  Net 1092.06 ml   Filed Weights   11/13/2023 0413  Weight: 54.4 kg    Examination: General: lethargic. Comfortable. On RA. SpO2 96%  HENT: PERL, normal pharynx and oral mucosa. No LNE or thyromegaly. No JVD Lungs: symmetrical air entry bilaterally. No crackles or wheezing Cardiovascular: NL S1/S2. No m/g/r Abdomen: no distension or tenderness Extremities: no edema. Symmetrical  Neuro: nonfocal   Left arm surgical wound: clean  Resolved Hospital Problem list     Assessment & Plan:  Metabolic encephalopathy due to septic shock Clonic tonic seizures x2 Chronic bilateral SDH -Stat Head CT w/o contrast -Consult neurology -?EEG -Keppra  Septic shock, ?source Lactic acidosis/AGMA -Vasopressors for MAP >65 mmHg -Rocephin -One dose of Vanco -Bcx2 -UA -PCT -I/O chart -NS 0.9% 1 L bolus -NS 0.9% drip -Serial LA -Echo -a-line  Acute PE -IV heparin protocol, will continue if CT head is stable  Possible proximal colonic mass with an enlarged ileocolic LN -GI eval when stable  Elevated Ammonia -Serial ammonia level -Lactulose  AKI -Monitor Cr -IVF  HyperK due to AGMA -Monitor K -IVF -IV Ca  DM-2 -Glycemic control  -Goal glucose 140-180 mg/dl -NFA2Z  HTN -Hold BP meds  GERD -H2B   Anxiety/depression -Hold meds for now  Vit B 12 def: s/p 7 injections -PO Vit B12 when appropriate  Pancreatic insufficiency -Resume Creon when TF is started  Anemia of chronic illness -Monitor  Moderate PCM -Consult dietitian   Left radial fracture  and ORIF (11/11/2023)  Best Practice (right click and "Reselect all SmartList Selections" daily)   Diet/type: NPO w/ oral meds DVT prophylaxis systemic heparin Pressure ulcer(s): N/A GI prophylaxis: H2B Lines: Central line Foley:  Yes, and it is still needed Code Status:  DNR Last date of multidisciplinary goals of care discussion []   Labs   CBC: Recent Labs  Lab 11/16/23 0444  11/15/2023 0437  WBC  --  31.2*  NEUTROABS  --  23.3*  HGB 9.2* 10.4*  HCT  --  37.2  MCV  --  93.5  PLT  --  593*    Basic Metabolic Panel: Recent Labs  Lab 11/28/2023 0437  NA 137  K 5.4*  CL 94*  CO2 <7*  GLUCOSE 119*  BUN 47*  CREATININE 4.93*  CALCIUM 9.1  MG 2.4   GFR: Estimated Creatinine Clearance: 7.2 mL/min (A) (by C-G formula based on SCr of 4.93 mg/dL (H)). Recent Labs  Lab 11/19/2023 0437 11/13/2023 0457 11/17/2023 0713  WBC 31.2*  --   --   LATICACIDVEN  --  >9.0* >9.0*    Liver Function Tests: Recent Labs  Lab 11/17/2023 0437  AST 43*  ALT 19  ALKPHOS 125  BILITOT 0.9  PROT 7.3  ALBUMIN 2.7*   Recent Labs  Lab 12/02/2023 0437  LIPASE 23   Recent Labs  Lab 11/28/2023 0437  AMMONIA 331*    ABG No results found for: "PHART", "PCO2ART", "PO2ART", "HCO3", "TCO2", "ACIDBASEDEF", "O2SAT"   Coagulation Profile: No results for input(s): "INR", "PROTIME" in the last 168 hours.  Cardiac Enzymes: No results for input(s): "CKTOTAL", "CKMB", "CKMBINDEX", "TROPONINI" in the last 168 hours.  HbA1C: Hemoglobin A1C  Date/Time Value Ref Range Status  08/16/2023 11:42 AM 8.8 (A) 4.0 - 5.6 % Final  05/11/2023 08:42 AM 8.3 (A) 4.0 - 5.6 % Final   Hgb A1c MFr Bld  Date/Time Value Ref Range Status  04/26/2022 11:47 AM 8.1 (H) 4.6 - 6.5 % Final    Comment:    Glycemic Control Guidelines for People with Diabetes:Non Diabetic:  <6%Goal of Therapy: <7%Additional Action Suggested:  >8%   04/15/2021 08:19 AM 8.2 (H) 4.6 - 6.5 % Final    Comment:    Glycemic Control Guidelines for People with Diabetes:Non Diabetic:  <6%Goal of Therapy: <7%Additional Action Suggested:  >8%     CBG: Recent Labs  Lab 11/16/23 0742 11/16/23 1126 11/16/23 1605 11/13/2023 0959 11/27/2023 1108  GLUCAP 206* 236* 230* 172* 205*    Review of Systems:   AMS  Past Medical History:  She,  has a past medical history of Acute cystitis (05/23/2018), Acute deep vein thrombosis (DVT) of  right tibial vein (HCC) (04/03/2020), Anxiety, Arthritis, Bronchitis (06/17/2015), Cellulitis (12/13/2018), Chickenpox, COVID-19 virus infection (06/24/2022), Diabetes mellitus without complication (HCC), Dizziness (01/15/2022), Fall (12/10/2022), GERD (gastroesophageal reflux disease), Hypertension, Laceration of skin and cutaneous sensory nerve of left upper extremity (04/13/2022), Osteoporosis, Type 2 diabetes mellitus (HCC), and Urinary tract infection.   Surgical History:   Past Surgical History:  Procedure Laterality Date   ABDOMINAL HYSTERECTOMY  1984   ABDOMINAL HYSTERECTOMY     APPENDECTOMY  1958   CATARACT EXTRACTION Right 04/25/2023   CHOLECYSTECTOMY     GALLBLADDER SURGERY     MANDIBLE FRACTURE SURGERY  07/13/2016   ORIF RADIAL FRACTURE Left 11/10/2023   Procedure: OPEN REDUCTION INTERNAL FIXATION (ORIF) RADIAL FRACTURE;  Surgeon: Christena Flake, MD;  Location: [facility] ORS;  Service: Orthopedics;  Laterality: Left;   TONSILLECTOMY  1958   TONSILLECTOMY     WRIST FRACTURE SURGERY Right    2000     Social History:   reports that she has never smoked. She has never used smokeless tobacco. She reports that she does not drink alcohol and does not use drugs.   Family History:  Her family history includes Hypertension in her father, mother, and sister; Kidney disease in her sister; Stroke in her father and mother. There is no history of Breast cancer.   Allergies Allergies  Allergen Reactions   Macrobid [Nitrofurantoin] Nausea Only    Pts son wanted noted that macrobid caused bad nausea   Sulfamethoxazole-Trimethoprim     Other reaction(s): Not available   Tramadol Other (See Comments)    hallucinations   Penicillins Rash     Home Medications  Prior to Admission medications   Medication Sig Start Date End Date Taking? Authorizing Provider  amLODipine (NORVASC) 10 MG tablet TAKE ONE TABLET BY MOUTH ONCE DAILY FOR BLOOD PRESSURE Patient taking differently: Take 10 mg by mouth  daily. TAKE ONE TABLET BY MOUTH ONCE DAILY FOR BLOOD PRESSURE. 03/18/23   Clark, Katherine K, NP  ascorbic acid (VITAMIN C) 500 MG tablet Take 500 mg by mouth daily.    [provider]  atorvastatin (LIPITOR) 40 MG tablet Take 1 tablet (40 mg total) by mouth daily. 11/17/23   Aisha Hove, MD  Calcium-Magnesium-Vitamin D 500-250-125 MG-MG-UNIT TABS Take 1 tablet by mouth daily.    [provider]  co-enzyme Q-10 30 MG capsule Take 30 mg by mouth 3 (three) times daily.    [provider]  cyanocobalamin 2000 MCG tablet Take 1 tablet (2,000 mcg total) by mouth daily. 11/17/23   Aisha Hove, MD  glucose blood (ONE TOUCH ULTRA TEST) test strip USE AS DIRECTED TEST 3 TIMES DAILY E11.9 01/18/19   Clark, Katherine K, NP  insulin aspart (NOVOLOG) 100 UNIT/ML injection Inject 0-9 Units into the skin 3 (three) times daily with meals. CBG < 70: Implement Hypoglycemia Standing Orders and refer to Hypoglycemia Standing Orders sidebar report CBG 70 - 120: 0 units CBG 121 - 150: 1 unit CBG 151 - 200: 2 units CBG 201 - 250: 3 units CBG 251 - 300: 5 units CBG 301 - 350: 7 units CBG 351 - 400: 9 units CBG > 400: call MD and obtain STAT lab verification 11/16/23   Aisha Hove, MD  insulin aspart (NOVOLOG) 100 UNIT/ML injection Inject 0-5 Units into the skin at bedtime. CBG < 70: Implement Hypoglycemia Standing Orders and refer to Hypoglycemia Standing Orders sidebar report CBG 70 - 120: 0 units CBG 121 - 150: 0 units CBG 151 - 200: 0 units CBG 201 - 250: 2 units CBG 251 - 300: 3 units CBG 301 - 350: 4 units CBG 351 - 400: 5 units CBG > 400: call MD and obtain STAT lab verification 11/16/23   Aisha Hove, MD  irbesartan (AVAPRO) 300 MG tablet TAKE 1 TABLET BY MOUTH ONCE DAILY FOR BLOOD PRESSURE 11/11/23   Clark, Katherine K, NP  metFORMIN (GLUCOPHAGE) 1000 MG tablet TAKE 1 TABLET BY MOUTH TWICE DAILY WITH A MEAL FOR DIABETES Patient taking differently: Take 1,000 mg by  mouth 2 (two) times daily with a meal. 11/02/23   Clark, Katherine K, NP  metoprolol succinate (TOPROL-XL) 25 MG 24 hr tablet TAKE ONE TABLET BY MOUTH TWICE DAILY FOR blood pressure. 05/31/23   Gollan, Timothy J, MD  Omega-3 Fatty Acids (FISH OIL) 300 MG  CAPS Take by mouth.    [provider]  oxyCODONE-acetaminophen (PERCOCET/ROXICET) 5-325 MG tablet Take 1 tablet by mouth every 6 (six) hours as needed for moderate pain (pain score 4-6). 11/15/23   Rojelio Clement, PA-C  senna-docusate (SENOKOT-S) 8.6-50 MG tablet Take 2 tablets by mouth 2 (two) times daily. 11/16/23   Sreeram, Narendranath, MD  Vitamin D, Ergocalciferol, (DRISDOL) 1.25 MG (50000 UNIT) CAPS capsule TAKE 1 CAPSULE BY MOUTH ONCE WEEKLY 11/02/23   Gabriel John, NP     Critical care time: 60 min      Madelynn Schilder, MD  [facility]

## 2023-12-08 NOTE — Progress Notes (Signed)
 PHARMACY - ANTICOAGULATION CONSULT NOTE  Pharmacy Consult for heparin Indication: pulmonary embolus in setting of chronic SDH  Allergies  Allergen Reactions   Macrobid [Nitrofurantoin] Nausea Only    Pts son wanted noted that macrobid caused bad nausea   Sulfamethoxazole-Trimethoprim     Other reaction(s): Not available   Tramadol Other (See Comments)    hallucinations   Penicillins Rash    Patient Measurements: Height: 5\' 5"  (165.1 cm) Weight: 54.4 kg (119 lb 14.9 oz) IBW/kg (Calculated) : 57 HEPARIN DW (KG): 54.4  Vital Signs: Temp: 97.5 F (36.4 C) (04/14 0441) BP: 107/40 (04/14 0554) Pulse Rate: 80 (04/14 0551)  Labs: Recent Labs    12/06/2023 0437  HGB 10.4*  HCT 37.2  PLT 593*  CREATININE 4.93*  TROPONINIHS 12    Estimated Creatinine Clearance: 7.2 mL/min (A) (by C-G formula based on SCr of 4.93 mg/dL (H)).   Medical History: Past Medical History:  Diagnosis Date   Acute cystitis 05/23/2018   Acute deep vein thrombosis (DVT) of right tibial vein (HCC) 04/03/2020   Anxiety    Arthritis    Bronchitis 06/17/2015   Cellulitis 12/13/2018   Chickenpox    COVID-19 virus infection 06/24/2022   Diabetes mellitus without complication (HCC)    Dizziness 01/15/2022   Fall 12/10/2022   GERD (gastroesophageal reflux disease)    Hypertension    Laceration of skin and cutaneous sensory nerve of left upper extremity 04/13/2022   Osteoporosis    Type 2 diabetes mellitus (HCC)    Urinary tract infection     Assessment: 87yo female was discharged to SNF 5d ago after hospitalization for metabolic encephalopathy, now c/o abdominal pain >> CT reveals PE; of note pt had ORIF for L radial Fx 4/3, also w/ h/o chronic SDH.  Goal of Therapy:  Heparin level 0.3-0.5 units/ml Monitor platelets by anticoagulation protocol: Yes   Plan:  Start heparin conservatively at 700 units/hr without bolus given chronic SDH. Monitor heparin levels and CBC.  Lonnie Roberts, PharmD,  BCPS  11/09/2023,6:31 AM

## 2023-12-08 NOTE — Progress Notes (Signed)
 Discussed with the son the CT Head new changes and neurology recommendations. He does not want her to have neurosurgical procedures and agrees to hold on consulting neurosurgery for now.

## 2023-12-08 NOTE — ED Provider Notes (Signed)
 [place] EMERGENCY DEPARTMENT AT [facility] Provider Note   CSN: [id number] Arrival date & time: 12/02/2023  0404     History  Chief Complaint  Patient presents with   Abdominal Pain    Morgan Bowen is a 88 y.o. female.   Abdominal Pain Patient presents for abdominal pain.  Medical history includes DM, HTN, osteoporosis, anxiety, depression, HLD, TIA, GERD.  She was recently admitted 2 weeks ago for metabolic encephalopathy.  Cause was unknown but polypharmacy was suspected.  During her hospitalization, she underwent ORIF of left radial fracture on 4/3.  She was found to be B12 deficient and pancreatic insufficiency was suspected.  She completed 7 days of injectable B12 and was started on oral B12 in addition to Creon.  She was discharged to nursing facility 5 days ago.  Per EMS, nursing facility noted patient to be having abdominal pain and back pain during the night.  She was given a dose of Percocet 2 hours prior to arrival.  Patient does continue to endorse abdominal pain.  History is limited by what seems to be altered mental status.  History per son: Patient has history of chronic nausea.  This worsened after her recent left radial injury.  Since her surgery 2 weeks ago, she has had increased frequency of nausea and dry heaving.  She has had poor p.o. intake.  She has also had recent constipation but did have a large bowel movement on Saturday.  Yesterday afternoon, she was at her normal mentation.  Abdominal pain developed in the evening.    Home Medications Prior to Admission medications   Medication Sig Start Date End Date Taking? Authorizing Provider  amLODipine (NORVASC) 10 MG tablet TAKE ONE TABLET BY MOUTH ONCE DAILY FOR BLOOD PRESSURE Patient taking differently: Take 10 mg by mouth daily. TAKE ONE TABLET BY MOUTH ONCE DAILY FOR BLOOD PRESSURE. 03/18/23   Clark, Katherine K, NP  ascorbic acid (VITAMIN C) 500 MG tablet Take 500 mg by mouth daily.    [provider]  atorvastatin (LIPITOR) 40 MG tablet Take 1 tablet (40 mg total) by mouth daily. 11/17/23   Aisha Hove, MD  Calcium-Magnesium-Vitamin D 500-250-125 MG-MG-UNIT TABS Take 1 tablet by mouth daily.    [provider]  co-enzyme Q-10 30 MG capsule Take 30 mg by mouth 3 (three) times daily.    [provider]  cyanocobalamin 2000 MCG tablet Take 1 tablet (2,000 mcg total) by mouth daily. 11/17/23   Aisha Hove, MD  glucose blood (ONE TOUCH ULTRA TEST) test strip USE AS DIRECTED TEST 3 TIMES DAILY E11.9 01/18/19   Clark, Katherine K, NP  insulin aspart (NOVOLOG) 100 UNIT/ML injection Inject 0-9 Units into the skin 3 (three) times daily with meals. CBG < 70: Implement Hypoglycemia Standing Orders and refer to Hypoglycemia Standing Orders sidebar report CBG 70 - 120: 0 units CBG 121 - 150: 1 unit CBG 151 - 200: 2 units CBG 201 - 250: 3 units CBG 251 - 300: 5 units CBG 301 - 350: 7 units CBG 351 - 400: 9 units CBG > 400: call MD and obtain STAT lab verification 11/16/23   Aisha Hove, MD  insulin aspart (NOVOLOG) 100 UNIT/ML injection Inject 0-5 Units into the skin at bedtime. CBG < 70: Implement Hypoglycemia Standing Orders and refer to Hypoglycemia Standing Orders sidebar report CBG 70 - 120: 0 units CBG 121 - 150: 0 units CBG 151 - 200: 0 units CBG 201 - 250: 2  units CBG 251 - 300: 3 units CBG 301 - 350: 4 units CBG 351 - 400: 5 units CBG > 400: call MD and obtain STAT lab verification 11/16/23   Marcelino Duster, MD  irbesartan (AVAPRO) 300 MG tablet TAKE 1 TABLET BY MOUTH ONCE DAILY FOR BLOOD PRESSURE 11/11/23   Doreene Nest, NP  metFORMIN (GLUCOPHAGE) 1000 MG tablet TAKE 1 TABLET BY MOUTH TWICE DAILY WITH A MEAL FOR DIABETES Patient taking differently: Take 1,000 mg by mouth 2 (two) times daily with a meal. 11/02/23   Doreene Nest, NP  metoprolol succinate (TOPROL-XL) 25 MG 24 hr tablet TAKE ONE TABLET BY MOUTH TWICE DAILY FOR blood  pressure. 05/31/23   Antonieta Iba, MD  Omega-3 Fatty Acids (FISH OIL) 300 MG CAPS Take by mouth.    [provider]  oxyCODONE-acetaminophen (PERCOCET/ROXICET) 5-325 MG tablet Take 1 tablet by mouth every 6 (six) hours as needed for moderate pain (pain score 4-6). 11/15/23   Anson Oregon, PA-C  senna-docusate (SENOKOT-S) 8.6-50 MG tablet Take 2 tablets by mouth 2 (two) times daily. 11/16/23   Marcelino Duster, MD  Vitamin D, Ergocalciferol, (DRISDOL) 1.25 MG (50000 UNIT) CAPS capsule TAKE 1 CAPSULE BY MOUTH ONCE WEEKLY 11/02/23   Doreene Nest, NP      Allergies    Macrobid [nitrofurantoin], Sulfamethoxazole-trimethoprim, Tramadol, and Penicillins    Review of Systems   Review of Systems  Unable to perform ROS: Mental status change  Gastrointestinal:  Positive for abdominal pain.  Musculoskeletal:  Positive for back pain.    Physical Exam Updated Vital Signs BP (!) 121/39   Pulse 75   Temp (!) 97.5 F (36.4 C)   Resp (!) 30   Ht 5\' 5"  (1.651 m)   Wt 54.4 kg   SpO2 100%   BMI 19.96 kg/m  Physical Exam Vitals and nursing note reviewed.  Constitutional:      General: She is not in acute distress.    Appearance: She is well-developed and normal weight. She is not ill-appearing, toxic-appearing or diaphoretic.  HENT:     Head: Normocephalic and atraumatic.  Eyes:     Conjunctiva/sclera: Conjunctivae normal.  Cardiovascular:     Rate and Rhythm: Normal rate and regular rhythm.     Heart sounds: No murmur heard. Pulmonary:     Effort: Pulmonary effort is normal. Tachypnea present. No respiratory distress.     Breath sounds: Normal breath sounds. No wheezing or rales.  Abdominal:     Palpations: Abdomen is soft.     Tenderness: There is generalized abdominal tenderness. There is no guarding or rebound.  Musculoskeletal:        General: No swelling.     Cervical back: Neck supple.  Skin:    General: Skin is warm and dry.     Coloration: Skin is not  cyanotic, jaundiced or pale.  Neurological:     General: No focal deficit present.     Mental Status: She is alert.  Psychiatric:        Mood and Affect: Mood is anxious.        Behavior: Behavior normal.     ED Results / Procedures / Treatments   Labs (all labs ordered are listed, but only abnormal results are displayed) Labs Reviewed  COMPREHENSIVE METABOLIC PANEL WITH GFR - Abnormal; Notable for the following components:      Result Value   Potassium 5.4 (*)    Chloride 94 (*)  CO2 <7 (*)    Glucose, Bld 119 (*)    BUN 47 (*)    Creatinine, Ser 4.93 (*)    Albumin 2.7 (*)    AST 43 (*)    GFR, Estimated 8 (*)    All other components within normal limits  CBC WITH DIFFERENTIAL/PLATELET - Abnormal; Notable for the following components:   WBC 31.2 (*)    Hemoglobin 10.4 (*)    MCHC 28.0 (*)    RDW 18.7 (*)    Platelets 593 (*)    Neutro Abs 23.3 (*)    Monocytes Absolute 1.1 (*)    Basophils Absolute 0.3 (*)    Abs Immature Granulocytes 2.84 (*)    All other components within normal limits  AMMONIA - Abnormal; Notable for the following components:   Ammonia 331 (*)    All other components within normal limits  TSH - Abnormal; Notable for the following components:   TSH 7.090 (*)    All other components within normal limits  LACTIC ACID, PLASMA - Abnormal; Notable for the following components:   Lactic Acid, Venous >9.0 (*)    All other components within normal limits  CULTURE, BLOOD (ROUTINE X 2)  CULTURE, BLOOD (ROUTINE X 2)  LIPASE, BLOOD  MAGNESIUM  URINALYSIS, W/ REFLEX TO CULTURE (INFECTION SUSPECTED)  LACTIC ACID, PLASMA  HEPARIN LEVEL (UNFRACTIONATED)  I-STAT CHEM 8, ED  TYPE AND SCREEN  TROPONIN I (HIGH SENSITIVITY)  TROPONIN I (HIGH SENSITIVITY)    EKG EKG Interpretation Date/Time:  Monday November 21 2023 04:19:01 EDT Ventricular Rate:  84 PR Interval:  165 QRS Duration:  127 QT Interval:  390 QTC Calculation: 461 R Axis:   67  Text  Interpretation: Sinus rhythm Nonspecific intraventricular conduction delay Abnormal inferior Q waves Minimal ST depression, inferior leads Confirmed by Iva Mariner ([id number]) on 11/10/2023 4:39:17 AM  Radiology CT Angio Chest/Abd/Pel for Dissection W and/or Wo Contrast Result Date: 12/06/2023 CLINICAL DATA:  Acute aortic syndrome suspected.  Back pain EXAM: CT ANGIOGRAPHY CHEST, ABDOMEN AND PELVIS TECHNIQUE: Non-contrast CT of the chest was initially obtained. Multidetector CT imaging through the chest, abdomen and pelvis was performed using the standard protocol during bolus administration of intravenous contrast. Multiplanar reconstructed images and MIPs were obtained and reviewed to evaluate the vascular anatomy. RADIATION DOSE REDUCTION: This exam was performed according to the departmental dose-optimization program which includes automated exposure control, adjustment of the mA and/or kV according to patient size and/or use of iterative reconstruction technique. CONTRAST:  80mL OMNIPAQUE IOHEXOL 350 MG/ML SOLN COMPARISON:  Abdomen and pelvis CT 11/08/2023 FINDINGS: CTA CHEST FINDINGS Cardiovascular: Normal heart size. No pericardial effusion. Extensive atheromatous calcification of the aorta, great vessels, and coronaries. Pulmonary artery filling defects affecting left more than right lower lobe segmental branches. Dominant emboli demarcated on series 6. No right heart dilatation. Mediastinum/Nodes: Small sliding hiatal hernia which accounts for mild thickening of the lower esophagus. Lungs/Pleura: There is no edema, consolidation, effusion, or pneumothorax. Musculoskeletal: Extensive thoracic spine degeneration. Review of the MIP images confirms the above findings. CTA ABDOMEN AND PELVIS FINDINGS VASCULAR Aorta: Extensive atheromatous calcification of the aorta. No aneurysm or dissection Celiac: Extensive atheromatous plaque especially of the splenic artery. No acute finding or proximal flow reducing stenosis  SMA: Atheromatous plaque at the ostium.  No acute finding or beading Renals: Bilateral prominent ostial plaque. Duplicated right renal artery. IMA: Patent Inflow: Generalized atheromatous plaque without dissection or aneurysm. Veins: Unremarkable for arterial timing Review of the MIP  images confirms the above findings. NON-VASCULAR Hepatobiliary: No focal liver abnormality.Cholecystectomy. No biliary dilatation Pancreas: Generalized atrophy without acute finding Spleen: Unremarkable. Adrenals/Urinary Tract: Negative adrenals. No hydronephrosis or stone. Unremarkable bladder. Stomach/Bowel: No obstruction. No appendicitis. Severe distal colonic diverticulosis. There is submucosal fat deposition in the proximal colon with sparing in area near the ileocecal valve which could reflect tumor, 3.5 cm craniocaudal and associated with a enlarged and possibly heterogeneously enhancing 16 mm ileocolic node. Lymphatic: Ileocolic node as above.  No retroperitoneal adenopathy Reproductive:Hysterectomy. Mild fat stranding near the vaginal cuff is likely scar. Other: No ascites or pneumoperitoneum. Musculoskeletal: No acute abnormalities. Generalized degeneration. Remote L1 compression fracture. Critical Value/emergent results were called by telephone at the time of interpretation on 11/09/2023 at 5:51 am to provider [facility] , who verbally acknowledged these results. Review of the MIP images confirms the above findings. IMPRESSION: Bilateral segmental pulmonary emboli.  No right heart dilatation. No evidence of acute aortic syndrome. Concern for proximal colonic mass with an enlarged ileocolic lymph node. Chronic findings as described above. Electronically Signed   By: Tiburcio Pea M.D.   On: 11/12/2023 05:53   CT Head Wo Contrast Result Date: 11/18/2023 CLINICAL DATA:  Mental status changes.  Dementia. EXAM: CT HEAD WITHOUT CONTRAST TECHNIQUE: Contiguous axial images were obtained from the base of the skull through the  vertex without intravenous contrast. RADIATION DOSE REDUCTION: This exam was performed according to the departmental dose-optimization program which includes automated exposure control, adjustment of the mA and/or kV according to patient size and/or use of iterative reconstruction technique. COMPARISON:  11/07/2023 FINDINGS: Brain: The bilateral low-density subdural fluid collections are mildly progressive in the interval since prior studies from 11/06/2023 and 11/07/2023. Right-sided collection measures on the order of 12 mm thickness today compared to 8 mm on 11/06/2023. Left-sided collection is 8 mm today compared to 4 mm previously. 2 mm right to left subfalcine midline shift anteriorly is similar to prior. There does appear to be some mass-effect on the frontal parietal brain on today's study, increased from prior. No evidence for acute hemorrhage, hydrocephalus, or mass lesion. Old right parietal and left cerebellar infarcts are stable. Vascular: No hyperdense vessel or unexpected calcification. Skull: No evidence for fracture. No worrisome lytic or sclerotic lesion. Sinuses/Orbits: Chronic mucosal disease left sphenoid sinus is stable. Remaining visualized paranasal sinuses are clear. Visualized portions of the globes and intraorbital fat are unremarkable. Other: None. IMPRESSION: 1. Mild interval progression of the bilateral low-density subdural fluid collections with increased mass-effect on the frontoparietal brain. Imaging features consistent with chronic subdural hematomas. 2. Stable old right parietal and left cerebellar infarcts. 3. Chronic mucosal disease left sphenoid sinus. Electronically Signed   By: Kennith Center M.D.   On: 11/20/2023 05:45   DG Chest Portable 1 View Result Date: 11/22/2023 CLINICAL DATA:  Tachypnea EXAM: PORTABLE CHEST 1 VIEW COMPARISON:  08/16/2023 FINDINGS: Normal heart size and mediastinal contours. No acute infiltrate or edema. No effusion or pneumothorax. No acute osseous  findings. Artifact from EKG leads. IMPRESSION: No active disease. Electronically Signed   By: Tiburcio Pea M.D.   On: 11/22/2023 05:34    Procedures Central Line  Date/Time: 12/03/2023 7:38 AM  Performed by: Gloris Manchester, MD Authorized by: Gloris Manchester, MD   Consent:    Consent obtained:  Verbal   Consent given by:  Patient (Son)   Risks, benefits, and alternatives were discussed: yes     Risks discussed:  Arterial puncture, bleeding and incorrect placement  Alternatives discussed:  No treatment and delayed treatment Universal protocol:    Procedure explained and questions answered to patient or proxy's satisfaction: yes   Pre-procedure details:    Indication(s): central venous access and insufficient peripheral access     Hand hygiene: Hand hygiene performed prior to insertion     Sterile barrier technique: All elements of maximal sterile technique followed     Skin preparation:  Chlorhexidine   Skin preparation agent: Skin preparation agent completely dried prior to procedure   Sedation:    Sedation type:  None Anesthesia:    Anesthesia method:  Local infiltration   Local anesthetic:  Lidocaine 1% w/o epi Procedure details:    Location:  R femoral   Patient position:  Supine   Procedural supplies:  Triple lumen   Catheter size:  7 Fr   Landmarks identified: yes     Ultrasound guidance: yes     Ultrasound guidance timing: real time     Sterile ultrasound techniques: Sterile gel and sterile probe covers were used     Number of attempts:  1   Successful placement: yes   Post-procedure details:    Post-procedure:  Dressing applied and line sutured   Assessment:  Blood return through all ports and free fluid flow   Procedure completion:  Tolerated well, no immediate complications     Medications Ordered in ED Medications  norepinephrine (LEVOPHED) 4mg  in (0.016 mg/mL) premix infusion (9 mcg/min Intravenous Infusion Verify 11/09/2023 0640)  heparin ADULT infusion 100  units/mL (25000 units/226mL) (700 Units/hr Intravenous New Bag/Given 11/18/2023 0735)  lactulose (CHRONULAC) 10 GM/15ML solution 30 g (has no administration in time range)  fentaNYL (SUBLIMAZE) injection 25 mcg (25 mcg Intravenous Given 11/24/2023 0449)  lactated ringers bolus 500 mL (0 mLs Intravenous Stopped 11/22/2023 0639)  ondansetron (ZOFRAN) injection 4 mg (4 mg Intravenous Given 11/20/2023 0536)  iohexol (OMNIPAQUE) 350 MG/ML injection 80 mL (80 mLs Intravenous Contrast Given 11/20/2023 0513)  lactated ringers bolus 500 mL (0 mLs Intravenous Stopped 12/01/2023 0639)  piperacillin-tazobactam (ZOSYN) IVPB 3.375 g (0 g Intravenous Stopped 12/05/2023 0639)  lactated ringers bolus 1,000 mL (1,000 mLs Intravenous New Bag/Given 11/16/2023 9811)    ED Course/ Medical Decision Making/ A&P                                 Medical Decision Making Amount and/or Complexity of Data Reviewed Labs: ordered. Radiology: ordered.  Risk Prescription drug management. Decision regarding hospitalization.   This patient presents to the ED for concern of abdominal pain, this involves an extensive number of treatment options, and is a complaint that carries with it a high risk of complications and morbidity.  The differential diagnosis includes intra-abdominal infection, mesenteric ischemia, perforated viscus, constipation, UTI, nephrolithiasis   Co morbidities that complicate the patient evaluation  DM, HTN, osteoporosis, anxiety, depression, HLD, TIA, GERD   Additional history obtained:  Additional history obtained from patient's son External records from outside source obtained and reviewed including EMR   Lab Tests:  I Ordered, and personally interpreted labs.  The pertinent results include: Acute renal failure is present with azotemia, severe metabolic acidosis, mild hyperkalemia.  A large leukocytosis is present in addition to a mild thrombocytosis.  Patient's TSH is moderately elevated.  Ammonia is markedly  elevated.  Lactate is elevated greater than 9 suggestive of poor prognosis.   Imaging Studies ordered:  I ordered imaging studies including  chest x-ray, CT head, CTA dissection study I independently visualized and interpreted imaging which showed CT of head showed progression of bilateral low-density subdural fluid collections with increased mass effect on frontoparietal areas, when compared to imaging from 2 weeks ago; CTA revealed bilateral segmental pulmonary emboli in addition to proximal colonic mass concerning for neoplasm. I agree with the radiologist interpretation   Cardiac Monitoring: / EKG:  The patient was maintained on a cardiac monitor.  I personally viewed and interpreted the cardiac monitored which showed an underlying rhythm of: Sinus rhythm   Problem List / ED Course / Critical interventions / Medication management  Patient presenting for report of abdominal pain and back pain, starting overnight, from [facility] nursing facility.  On arrival, patient is awake.  She is found to be hyperventilating and moaning.  She is not able to provide a history.  Although EMS stated history of dementia, this is not seen on the recent documentation from her recent hospitalization.  When she was admitted 2 weeks ago, she was admitted for altered mental status from her baseline of alert and oriented x 4 and conversant.  Polypharmacy was suspected on her prior presentation.  Although her presentation today seems similar to documentation from 2 weeks ago, she is not on any altering medications other than Percocet.  When pressing on her abdomen, abdomen is found to be soft.  She does state that there is some tenderness present.  She denies any chest pain at this time.  CBC showed a large leukocytosis.  This does raise concern of possible sepsis.  Zosyn was ordered.  Additional IV fluids were ordered.  Patient underwent CTA dissection study in addition to CT of head.  CTA did reveal bilateral  segmental pulmonary emboli.  Unfortunately, CT head also showed progression of chronic bilateral SDH, identified initially 2 weeks ago.  I spoke with interventional radiologist on-call, who reviewed CTA.  Patient's pulmonary emboli are too small for thrombectomy.  I spoke with neurosurgeon on-call, Dr. Nat Badger, regarding use of anticoagulation in the setting of these chronic SDH's.  He does recommend initiating heparin gtt. without the bolus.  This was ordered.  While in the ED, patient did require Levophed for blood pressure support.  This was titrated up to 8 mcg/min.  I spoke with intensivist on-call, Dr. Charon Copper, who accepted the patient for admission to [facility], ICU.  Neurosurgery will follow in consult and likely reimage CT head tomorrow.  Further lab work notable for AKI and severe metabolic acidosis.  Further IV fluids were ordered.  Patient's son was updated on results and plan.  He confirms that patient is DNR.  In terms of intubation, he states that it would be acceptable if it was a temporary measure.  Central line was placed.  Patient awaiting transfer to [facility] I ordered medication including IV fluids and broad-spectrum antibiotics for empiric treatment of sepsis; additional IV fluids for acute renal failure and metabolic acidosis; heparin for bilateral PEs; Fentanyl for analgesia; Zofran for nausea; Levophed for hypotension Reevaluation of the patient after these medicines showed that the patient improved I have reviewed the patients home medicines and have made adjustments as needed   Consultations Obtained:  I requested consultation with the multiple specialists, as documented above,  and discussed lab and imaging findings as well as pertinent plan - they recommend: Admission to [facility], ICU, initiation of heparin gtt., neurosurgery will follow and consult.  Patient is not a candidate for IR thrombectomy due to  no lobar PEs identified.   Social Determinants of  Health:  Currently residing nursing facility  CRITICAL CARE Performed by: Iva Mariner   Total critical care time: 65 minutes  Critical care time was exclusive of separately billable procedures and treating other patients.  Critical care was necessary to treat or prevent imminent or life-threatening deterioration.  Critical care was time spent personally by me on the following activities: development of treatment plan with patient and/or surrogate as well as nursing, discussions with consultants, evaluation of patient's response to treatment, examination of patient, obtaining history from patient or surrogate, ordering and performing treatments and interventions, ordering and review of laboratory studies, ordering and review of radiographic studies, pulse oximetry and re-evaluation of patient's condition.         Final Clinical Impression(s) / ED Diagnoses Final diagnoses:  Acute renal failure, unspecified acute renal failure type (HCC)  Pulmonary embolism, unspecified chronicity, unspecified pulmonary embolism type, unspecified whether acute cor pulmonale present (HCC)  Hypotension, unspecified hypotension type  Generalized abdominal pain  Subdural hematoma, chronic (HCC)  Hyperammonemia (HCC)  Metabolic acidosis    Rx / DC Orders ED Discharge Orders     None         Iva Mariner, MD 11/20/2023 [phone]

## 2023-12-08 NOTE — Progress Notes (Signed)
 Nutrition Brief Note  Consult received for nutrition assessment. Chart reviewed. Pt now transitioning to comfort care.  No nutrition interventions planned at this time.  Please re-consult as needed.   Allie Yasmene Salomone, RDN, LDN Clinical Nutrition See [facility] for contact information.

## 2023-12-08 NOTE — Progress Notes (Signed)
 I discussed with Dr. Luke Salaam for consultation for seizure -- on my personal review of head CT confirmed with radiology, acute on chronic SDH in a patient who was started on heparin drip earlier due to PE in the setting of colonic mass, with septic shock of unclear etiology on pressors, recent left radial fracture s/p ORIF (/11/2023) ; DNR/DNI CODE STATUS  Discussed that her etiology of seizure is SDH CCM has paused heparin drip, she remains on pressors to support her blood pressure but has not been hypertensive She has been ordered for Keppra 500 mg x 1 dose due to her renal function Per Dr. Marene Shape patient is very lethargic on examination (and note DNR/DNI CODE STATUS)  SDH would primarily be managed by the neurosurgical team, with neurology following as needed for EEG monitoring and antiseizure medication titration depending on goals of care conversation  Recommend goals of care discussion with family, discussion with neurosurgery regarding whether patient would be a candidate for any neurosurgical intervention (suspect unlikely to be a good candidate given her comorbidities and age)  Certainly if family desires an aggressive scope of care, neurology is happy to be involved for full consultation; options will be extremely limited given her lethargy, comorbidities, likely not a good surgical candidate, and likely very hard to control seizures due to the extent of her SDH and stated DNR/DNI CODE STATUS  Baldwin Levee MD-PhD [facility] [phone]  12 min of care, majority in discussion with Dr. Luke Salaam via phone, who will notify patient/family of discussion with neurology  These are curbside recommendations based upon the information readily available in the chart on brief review as well as history and examination information provided to me by requesting provider and do not replace a full detailed consult

## 2023-12-08 NOTE — Progress Notes (Signed)
 2D echo attempted, patient is on comfort care per RN. No echo needed at this time

## 2023-12-08 NOTE — Plan of Care (Signed)

## 2023-12-08 NOTE — ED Triage Notes (Signed)
 Pt BIB [facility] From [facility] for c/o back pain and abdominal pain  Pt was given percocet by facility at 0247  BP 118/50 P 88 R 22 O2 98 %  Cbg 94  Pt has demenita and is poor historian

## 2023-12-08 NOTE — Progress Notes (Signed)
 Family decided to start comfort care measures now. See order set

## 2023-12-08 NOTE — ED Provider Notes (Signed)
 Haldol ordered for restlessness.   Ninetta Basket, MD 11/13/2023 [phone]

## 2023-12-08 NOTE — Progress Notes (Signed)
 1220 pt started having jerking like movements with eyes rolled to the back.  O2 saturation 85% on room Md called at bedside pt put on non-rebreather mask . Orders to take pt to stat CT.

## 2023-12-08 DEATH — deceased
